# Patient Record
Sex: Female | Born: 1950 | Race: Black or African American | Hispanic: No | Smoking: Current every day smoker
Health system: Southern US, Community
[De-identification: ages and names within clinical notes are randomized; demographics above are authoritative.]

## PROBLEM LIST (undated history)

## (undated) DIAGNOSIS — E7800 Pure hypercholesterolemia, unspecified: Secondary | ICD-10-CM

## (undated) DIAGNOSIS — I10 Essential (primary) hypertension: Secondary | ICD-10-CM

## (undated) DIAGNOSIS — C3492 Malignant neoplasm of unspecified part of left bronchus or lung: Secondary | ICD-10-CM

## (undated) DIAGNOSIS — E1140 Type 2 diabetes mellitus with diabetic neuropathy, unspecified: Secondary | ICD-10-CM

## (undated) DIAGNOSIS — E119 Type 2 diabetes mellitus without complications: Secondary | ICD-10-CM

## (undated) DIAGNOSIS — IMO0001 Reserved for inherently not codable concepts without codable children: Secondary | ICD-10-CM

## (undated) DIAGNOSIS — Z794 Long term (current) use of insulin: Secondary | ICD-10-CM

## (undated) DIAGNOSIS — M1990 Unspecified osteoarthritis, unspecified site: Secondary | ICD-10-CM

## (undated) DIAGNOSIS — D759 Disease of blood and blood-forming organs, unspecified: Secondary | ICD-10-CM

## (undated) HISTORY — DX: Type 2 diabetes mellitus with diabetic neuropathy, unspecified: E11.40

## (undated) HISTORY — PX: ABDOMINAL HYSTERECTOMY: SHX81

## (undated) SURGERY — Surgical Case
Anesthesia: *Unknown

---

## 2007-07-25 ENCOUNTER — Ambulatory Visit: Payer: Self-pay | Admitting: Cardiovascular Disease

## 2007-09-26 ENCOUNTER — Ambulatory Visit: Payer: Self-pay | Admitting: Family Medicine

## 2007-09-27 ENCOUNTER — Ambulatory Visit: Payer: Self-pay | Admitting: Family Medicine

## 2007-10-06 ENCOUNTER — Ambulatory Visit: Payer: Self-pay | Admitting: Family Medicine

## 2010-01-12 ENCOUNTER — Ambulatory Visit: Payer: Self-pay | Admitting: Family Medicine

## 2011-09-21 ENCOUNTER — Ambulatory Visit: Payer: Self-pay | Admitting: Family Medicine

## 2011-10-28 ENCOUNTER — Emergency Department: Payer: Self-pay | Admitting: Internal Medicine

## 2012-08-21 ENCOUNTER — Ambulatory Visit: Payer: Self-pay | Admitting: Obstetrics & Gynecology

## 2012-08-21 LAB — PROTIME-INR: INR: 0.9

## 2012-08-21 LAB — BASIC METABOLIC PANEL
Anion Gap: 6 — ABNORMAL LOW (ref 7–16)
BUN: 7 mg/dL (ref 7–18)
Chloride: 110 mmol/L — ABNORMAL HIGH (ref 98–107)
Creatinine: 0.59 mg/dL — ABNORMAL LOW (ref 0.60–1.30)
EGFR (African American): >60
EGFR (Non-African Amer.): >60
Potassium: 3.5 mmol/L (ref 3.5–5.1)

## 2012-08-21 LAB — CBC
MCH: 28.5 pg (ref 26.0–34.0)
MCV: 87 fL (ref 80–100)
Platelet: 164 10*3/uL (ref 150–440)
RBC: 4.18 10*6/uL (ref 3.80–5.20)
RDW: 14.5 % (ref 11.5–14.5)
WBC: 5.4 10*3/uL (ref 3.6–11.0)

## 2012-08-21 LAB — APTT: Activated PTT: 32.9 secs (ref 23.6–35.9)

## 2012-08-31 ENCOUNTER — Ambulatory Visit: Payer: Self-pay | Admitting: Obstetrics & Gynecology

## 2012-09-04 LAB — PATHOLOGY REPORT

## 2012-09-24 DIAGNOSIS — E119 Type 2 diabetes mellitus without complications: Secondary | ICD-10-CM | POA: Insufficient documentation

## 2012-09-24 DIAGNOSIS — I10 Essential (primary) hypertension: Secondary | ICD-10-CM | POA: Insufficient documentation

## 2012-09-24 DIAGNOSIS — C549 Malignant neoplasm of corpus uteri, unspecified: Secondary | ICD-10-CM | POA: Insufficient documentation

## 2012-09-28 DIAGNOSIS — F329 Major depressive disorder, single episode, unspecified: Secondary | ICD-10-CM | POA: Insufficient documentation

## 2012-09-28 DIAGNOSIS — M159 Polyosteoarthritis, unspecified: Secondary | ICD-10-CM | POA: Insufficient documentation

## 2013-11-05 ENCOUNTER — Ambulatory Visit: Payer: Self-pay | Admitting: Internal Medicine

## 2013-12-01 ENCOUNTER — Ambulatory Visit: Payer: Self-pay | Admitting: Internal Medicine

## 2013-12-12 ENCOUNTER — Emergency Department: Payer: Self-pay | Admitting: Emergency Medicine

## 2014-02-15 ENCOUNTER — Emergency Department: Payer: Self-pay | Admitting: Emergency Medicine

## 2014-02-15 LAB — URINALYSIS, COMPLETE
Bilirubin,UR: NEGATIVE
Glucose,UR: >=500 mg/dL (ref 0–75)
Hyaline Cast: 1
NITRITE: NEGATIVE
PROTEIN: NEGATIVE
Ph: 5 (ref 4.5–8.0)
SPECIFIC GRAVITY: 1.028 (ref 1.003–1.030)
Squamous Epithelial: NONE SEEN

## 2014-05-05 DIAGNOSIS — N764 Abscess of vulva: Secondary | ICD-10-CM | POA: Insufficient documentation

## 2014-06-27 DIAGNOSIS — R42 Dizziness and giddiness: Secondary | ICD-10-CM | POA: Insufficient documentation

## 2014-09-11 ENCOUNTER — Ambulatory Visit: Payer: Self-pay | Admitting: Family Medicine

## 2015-02-21 ENCOUNTER — Emergency Department: Payer: Self-pay | Admitting: Emergency Medicine

## 2015-03-24 NOTE — Op Note (Signed)
PATIENT NAME:  Susan Willis, Susan Willis MR#:  [id number] DATE OF BIRTH:  03-17-1951  DATE OF PROCEDURE:  08/31/2012  PREOPERATIVE DIAGNOSES:   1. Postmenopausal bleeding.  2. Fibroid uterus.   POSTOPERATIVE DIAGNOSES:    1. Postmenopausal bleeding.  2. Fibroid uterus.   PROCEDURES:  1. Hysteroscopy.  2. Dilatation and curettage with resection of endometrium.   SURGEON: Glean Salen, M.D.   ANESTHESIA: General.   ESTIMATED BLOOD LOSS: Minimal.  COMPLICATIONS: None.   FINDINGS: The patient had an irregular shape lining of the uterus. The patient did not have a distinct intrauterine submucosal polyp or fibroid.   DISPOSITION: To the recovery room in stable condition.   TECHNIQUE: The patient is prepped and draped in the usual sterile fashion after adequate anesthesia is obtained in the dorsal lithotomy position. Bladder is drained with a Robinson catheter. Speculum is placed and the anterior lip of the cervix is grasped with a tenaculum. The cervix is dilated to a size 20 Pratt dilator. A 30 degree hysteroscope is inserted with saline distention of the intrauterine cavity. The above-mentioned findings are noted. No perforations or injuries are encountered. Discrepancy of saline fluid at the conclusion of the case was 400 mL.   Using the MyoSure resection device, sampling and resection of the lining particularly in the thickened areas that may be where an intramural fibroid was located is performed. Tissue is sent to pathology for further analysis. The patient has no bleeding after the procedure is performed. She goes to the recovery room in stable condition. All sponge, instrument, and needle counts are correct.   ____________________________ R. Barnett Applebaum, MD rph:ap D: 08/31/2012 14:25:03 ET T: 08/31/2012 15:37:23 ET JOB#: [id number]  cc: Glean Salen, MD, <Dictator> Gae Dry MD ELECTRONICALLY SIGNED 09/03/2012 5:09

## 2015-08-06 DIAGNOSIS — C3492 Malignant neoplasm of unspecified part of left bronchus or lung: Secondary | ICD-10-CM

## 2015-08-06 HISTORY — DX: Malignant neoplasm of unspecified part of left bronchus or lung: C34.92

## 2015-09-11 ENCOUNTER — Emergency Department
Admission: EM | Admit: 2015-09-11 | Discharge: 2015-09-11 | Disposition: A | Discharge status: Home / Self Care | Payer: PPO | Attending: Emergency Medicine | Admitting: Emergency Medicine

## 2015-09-11 ENCOUNTER — Encounter: Payer: Self-pay | Admitting: *Deleted

## 2015-09-11 ENCOUNTER — Emergency Department: Payer: PPO

## 2015-09-11 DIAGNOSIS — Z720 Tobacco use: Secondary | ICD-10-CM | POA: Diagnosis not present

## 2015-09-11 DIAGNOSIS — M25552 Pain in left hip: Secondary | ICD-10-CM | POA: Diagnosis present

## 2015-09-11 DIAGNOSIS — I10 Essential (primary) hypertension: Secondary | ICD-10-CM | POA: Diagnosis not present

## 2015-09-11 DIAGNOSIS — E119 Type 2 diabetes mellitus without complications: Secondary | ICD-10-CM | POA: Diagnosis not present

## 2015-09-11 HISTORY — DX: Pure hypercholesterolemia, unspecified: E78.00

## 2015-09-11 HISTORY — DX: Unspecified osteoarthritis, unspecified site: M19.90

## 2015-09-11 HISTORY — DX: Essential (primary) hypertension: I10

## 2015-09-11 HISTORY — DX: Type 2 diabetes mellitus without complications: E11.9

## 2015-09-11 MED ORDER — MELOXICAM 15 MG PO TABS: 15.0000 mg | ORAL_TABLET | Freq: Every day | ORAL | Status: DC

## 2015-09-11 MED ORDER — METHOCARBAMOL 500 MG PO TABS: 500.0000 mg | ORAL_TABLET | Freq: Four times a day (QID) | ORAL | Status: DC | PRN

## 2015-09-11 NOTE — ED Notes (Addendum)
Pt seen by PCP on Wednesday for same complaint.  Pain started a week ago. Hip pain goes to tail bone. Unable to sit or lay down for periods of time. PCP told her to come to ER. Did not take any pain medications this morning.

## 2015-09-11 NOTE — ED Notes (Signed)
Patient reports left hip pain x 1 week. Unknown injury, but states has been more active than usual. Saw PCP Wednesday, stated it was most likely arthritis, given vicodin for pain, but states it is still hurting and doctor told her to come to ED for further testing.

## 2015-09-11 NOTE — ED Provider Notes (Signed)
[facility] Emergency Department Provider Note  ____________________________________________  Time seen: Approximately 11:48 AM  I have reviewed the triage vital signs and the nursing notes.   HISTORY  Chief Complaint Hip Pain   HPI Susan Willis is a 64 y.o. female presents for evaluation of left hip pain times one week. Hip pain goes all the way down to her tailbone. PCP told her to come to the ER for evaluation no relief with Vicodin.  Past Medical History  Diagnosis Date  . Arthritis   . Diabetes mellitus without complication ([place])   . Hypertension   . High cholesterol     There are no active problems to display for this patient.   Past Surgical History  Procedure Laterality Date  . Abdominal hysterectomy    . Cesarean section      Current Outpatient Rx  Name  Route  Sig  Dispense  Refill  . meloxicam (MOBIC) 15 MG tablet   Oral   Take 1 tablet (15 mg total) by mouth daily.   30 tablet   2   . methocarbamol (ROBAXIN) 500 MG tablet   Oral   Take 1 tablet (500 mg total) by mouth every 6 (six) hours as needed for muscle spasms.   30 tablet   0     Allergies Review of patient's allergies indicates no known allergies.  No family history on file.  Social History Social History  Substance Use Topics  . Smoking status: Current Every Day Smoker  . Smokeless tobacco: None  . Alcohol Use: No    Review of Systems Constitutional: No fever/chills Eyes: No visual changes. ENT: No sore throat. Cardiovascular: Denies chest pain. Respiratory: Denies shortness of breath. Gastrointestinal: No abdominal pain.  No nausea, no vomiting.  No diarrhea.  No constipation. Genitourinary: Negative for dysuria. Musculoskeletal: Positive for left hip pain Skin: Negative for rash. Neurological: Negative for headaches, focal weakness or numbness.  10-point ROS otherwise negative.  ____________________________________________   PHYSICAL  EXAM:  VITAL SIGNS: ED Triage Vitals  Enc Vitals Group     BP 09/11/15 1140 154/67 mmHg     Pulse Rate 09/11/15 1140 96     Resp 09/11/15 1140 18     Temp 09/11/15 1140 98 F (36.7 C)     Temp Source 09/11/15 1140 Oral     SpO2 09/11/15 1140 98 %     Weight 09/11/15 1140 232 lb (105.235 kg)     Height 09/11/15 1140 '5\' 6"'$  (1.676 m)     Head Cir --      Peak Flow --      Pain Score 09/11/15 1138 6     Pain Loc --      Pain Edu? --      Excl. in [place]? --     Constitutional: Alert and oriented. Well appearing and in no acute distress. Eyes: Conjunctivae are normal. PERRL. EOMI. Head: Atraumatic. Nose: No congestion/rhinnorhea. Mouth/Throat: Mucous membranes are moist.  Oropharynx non-erythematous. Neck: No stridor.   Cardiovascular: Normal rate, regular rhythm. Grossly normal heart sounds.  Good peripheral circulation. Respiratory: Normal respiratory effort.  No retractions. Lungs CTAB. Gastrointestinal: Soft and nontender. No distention. No abdominal bruits. No CVA tenderness. Musculoskeletal: No lower extremity tenderness nor edema.  No joint effusions. Left hip with limited range of motion increased pain with ambulation. No lower extremity edema noted. Neurologic:  Normal speech and language. No gross focal neurologic deficits are appreciated. No gait instability. Skin:  Skin  is warm, dry and intact. No rash noted. Psychiatric: Mood and affect are normal. Speech and behavior are normal.  ____________________________________________   LABS (all labs ordered are listed, but only abnormal results are displayed)  Labs Reviewed - No data to display ____________________________________________  RADIOLOGY  Left hip x-ray was some mild DJD but no acute fracture dislocation noted. ____________________________________________   PROCEDURES  Procedure(s) performed: None  Critical Care performed: No  ____________________________________________   INITIAL IMPRESSION /  ASSESSMENT AND PLAN / ED COURSE  Pertinent labs & imaging results that were available during my care of the patient were reviewed by me and considered in my medical decision making (see chart for details).  Patient stop Motrin 800 and start meloxicam 15 mg daily Robaxin as needed to relax some of the posterior muscles and follow up with PCP. She voices no other emergency medical complaints at this time. ____________________________________________   FINAL CLINICAL IMPRESSION(S) / ED DIAGNOSES  Final diagnoses:  Hip pain, acute, left      Arlyss Repress, PA-C 09/11/15 1256  Delman Kitten, MD 09/11/15 [phone]

## 2015-09-11 NOTE — Discharge Instructions (Signed)

## 2015-11-05 ENCOUNTER — Encounter: Payer: Self-pay | Admitting: Emergency Medicine

## 2015-11-05 ENCOUNTER — Emergency Department: Payer: PPO

## 2015-11-05 ENCOUNTER — Emergency Department
Admission: EM | Admit: 2015-11-05 | Discharge: 2015-11-05 | Disposition: A | Discharge status: Home / Self Care | Payer: PPO | Attending: Emergency Medicine | Admitting: Emergency Medicine

## 2015-11-05 DIAGNOSIS — J189 Pneumonia, unspecified organism: Secondary | ICD-10-CM

## 2015-11-05 DIAGNOSIS — I10 Essential (primary) hypertension: Secondary | ICD-10-CM | POA: Diagnosis not present

## 2015-11-05 DIAGNOSIS — J159 Unspecified bacterial pneumonia: Secondary | ICD-10-CM | POA: Diagnosis not present

## 2015-11-05 DIAGNOSIS — Z791 Long term (current) use of non-steroidal anti-inflammatories (NSAID): Secondary | ICD-10-CM | POA: Insufficient documentation

## 2015-11-05 DIAGNOSIS — R0789 Other chest pain: Secondary | ICD-10-CM | POA: Diagnosis not present

## 2015-11-05 DIAGNOSIS — E119 Type 2 diabetes mellitus without complications: Secondary | ICD-10-CM | POA: Diagnosis not present

## 2015-11-05 DIAGNOSIS — R079 Chest pain, unspecified: Secondary | ICD-10-CM | POA: Diagnosis present

## 2015-11-05 DIAGNOSIS — F17200 Nicotine dependence, unspecified, uncomplicated: Secondary | ICD-10-CM | POA: Diagnosis not present

## 2015-11-05 LAB — BASIC METABOLIC PANEL
ANION GAP: 4 — AB (ref 5–15)
BUN: 14 mg/dL (ref 6–20)
CALCIUM: 9.8 mg/dL (ref 8.9–10.3)
CHLORIDE: 106 mmol/L (ref 101–111)
CO2: 30 mmol/L (ref 22–32)
Creatinine, Ser: 0.72 mg/dL (ref 0.44–1.00)
GFR calc non Af Amer: >60 mL/min (ref >60)
GLUCOSE: 223 mg/dL — AB (ref 65–99)
Potassium: 4.5 mmol/L (ref 3.5–5.1)
Sodium: 140 mmol/L (ref 135–145)

## 2015-11-05 LAB — CBC
HEMATOCRIT: 35.2 % (ref 35.0–47.0)
HEMOGLOBIN: 11.6 g/dL — AB (ref 12.0–16.0)
MCH: 28 pg (ref 26.0–34.0)
MCHC: 32.9 g/dL (ref 32.0–36.0)
MCV: 85 fL (ref 80.0–100.0)
Platelets: 177 10*3/uL (ref 150–440)
RBC: 4.14 MIL/uL (ref 3.80–5.20)
RDW: 14.3 % (ref 11.5–14.5)
WBC: 6.1 10*3/uL (ref 3.6–11.0)

## 2015-11-05 LAB — TROPONIN I: Troponin I: <0.03 ng/mL (ref <0.031)

## 2015-11-05 MED ORDER — DOXYCYCLINE HYCLATE 100 MG PO CAPS: ORAL_CAPSULE | ORAL | Status: DC

## 2015-11-05 NOTE — Discharge Instructions (Signed)
We believe that your symptoms are caused today by pneumonia, an infection in your lung(s).  Fortunately you should start to improve quickly after taking your antibiotics.  Please take the full course of antibiotics as prescribed and drink plenty of fluids.    Follow up with your doctor as scheduled in about 2 weeks.  That will give you the opportunity to finish your full course of treatment of antibiotics (a 10-day course), and your primary care doctor will likely want to repeat a 2-view chest x-ray to make sure the infection has resolved.  Please let her know that the radiologist recommended that you get this repeat outpatient xray after treatment.  If you develop any new or worsening symptoms, including but not limited to fever in spite of taking over-the-counter ibuprofen and/or Tylenol, persistent vomiting, worsening shortness of breath, or other symptoms that concern you, please return to the Emergency Department immediately.    Pneumonia Pneumonia is an infection of the lungs.  CAUSES Pneumonia may be caused by bacteria or a virus. Usually, these infections are caused by breathing infectious particles into the lungs (respiratory tract). SIGNS AND SYMPTOMS   Cough.  Fever.  Chest pain.  Increased rate of breathing.  Wheezing.  Mucus production. DIAGNOSIS  If you have the common symptoms of pneumonia, your health care provider will typically confirm the diagnosis with a chest X-ray. The X-ray will show an abnormality in the lung (pulmonary infiltrate) if you have pneumonia. Other tests of your blood, urine, or sputum may be done to find the specific cause of your pneumonia. Your health care provider may also do tests (blood gases or pulse oximetry) to see how well your lungs are working. TREATMENT  Some forms of pneumonia may be spread to other people when you cough or sneeze. You may be asked to wear a mask before and during your exam. Pneumonia that is caused by bacteria is treated  with antibiotic medicine. Pneumonia that is caused by the influenza virus may be treated with an antiviral medicine. Most other viral infections must run their course. These infections will not respond to antibiotics.  HOME CARE INSTRUCTIONS   Cough suppressants may be used if you are losing too much rest. However, coughing protects you by clearing your lungs. You should avoid using cough suppressants if you can.  Your health care provider may have prescribed medicine if he or she thinks your pneumonia is caused by bacteria or influenza. Finish your medicine even if you start to feel better.  Your health care provider may also prescribe an expectorant. This loosens the mucus to be coughed up.  Take medicines only as directed by your health care provider.  Do not smoke. Smoking is a common cause of bronchitis and can contribute to pneumonia. If you are a smoker and continue to smoke, your cough may last several weeks after your pneumonia has cleared.  A cold steam vaporizer or humidifier in your room or home may help loosen mucus.  Coughing is often worse at night. Sleeping in a semi-upright position in a recliner or using a couple pillows under your head will help with this.  Get rest as you feel it is needed. Your body will usually let you know when you need to rest. PREVENTION A pneumococcal shot (vaccine) is available to prevent a common bacterial cause of pneumonia. This is usually suggested for:  People over 22 years old.  Patients on chemotherapy.  People with chronic lung problems, such as bronchitis or emphysema.  People with immune system problems. If you are over 65 or have a high risk condition, you may receive the pneumococcal vaccine if you have not received it before. In some countries, a routine influenza vaccine is also recommended. This vaccine can help prevent some cases of pneumonia.You may be offered the influenza vaccine as part of your care. If you smoke, it is  time to quit. You may receive instructions on how to stop smoking. Your health care provider can provide medicines and counseling to help you quit. SEEK MEDICAL CARE IF: You have a fever. SEEK IMMEDIATE MEDICAL CARE IF:   Your illness becomes worse. This is especially true if you are elderly or weakened from any other disease.  You cannot control your cough with suppressants and are losing sleep.  You begin coughing up blood.  You develop pain which is getting worse or is uncontrolled with medicines.  Any of the symptoms which initially brought you in for treatment are getting worse rather than better.  You develop shortness of breath or chest pain. MAKE SURE YOU:   Understand these instructions.  Will watch your condition.  Will get help right away if you are not doing well or get worse. Document Released: 11/21/2005 Document Revised: 04/07/2014 Document Reviewed: 02/10/2011 [facility] Patient Information 2015 [place], [place]. This information is not intended to replace advice given to you by your health care provider. Make sure you discuss any questions you have with your health care provider.

## 2015-11-05 NOTE — ED Notes (Signed)
Ambulatory to triage , no distress noted , complaining of chest pain and shortness of breath x1 week, denies cough, skin warm and dry

## 2015-11-05 NOTE — ED Notes (Signed)
Patient presents to the ED with sharp stabbing constant left sided chest pain x 2 weeks.  Patient states pain is only relieved with sleep.  Patient states she was taking meloxicam for hip pain but stopped taking it when she started having chest pain and read that meloxicam occasionally causes chest pain.  Patient states chest pain improved slightly when she stopped taking the meloxicam but has not subsided completely.

## 2015-11-05 NOTE — ED Provider Notes (Signed)
[facility] Emergency Department Provider Note  ____________________________________________  Time seen: Approximately 1:40 PM  I have reviewed the triage vital signs and the nursing notes.   HISTORY  Chief Complaint Chest Pain    HPI DARSHA ZUMSTEIN is a 64 y.o. female with hypertension and uncomplicated diabetes who presents with sharp stabbing constant left upper chest pain for about 2 weeks.  She states that nothing makes it better and nothing makes it worse.  She has had minimal shortness of breath recently that may or may not be associated with the chest pain.  She has had no nausea or vomiting.  She has had some subjective fevers but not consistently.  She is just frustrated that it keeps going on.  She describes the pain as moderate in severity.  She has had a little bit of nonproductive cough and had some general "cold-like symptoms" when the symptoms began gradually.   Past Medical History  Diagnosis Date  . Arthritis   . Diabetes mellitus without complication ([place])   . Hypertension   . High cholesterol     There are no active problems to display for this patient.   Past Surgical History  Procedure Laterality Date  . Abdominal hysterectomy    . Cesarean section      Current Outpatient Rx  Name  Route  Sig  Dispense  Refill  .           . meloxicam (MOBIC) 15 MG tablet   Oral   Take 1 tablet (15 mg total) by mouth daily.   30 tablet   2   . methocarbamol (ROBAXIN) 500 MG tablet   Oral   Take 1 tablet (500 mg total) by mouth every 6 (six) hours as needed for muscle spasms.   30 tablet   0     Allergies Review of patient's allergies indicates no known allergies.  No family history on file.  Social History Social History  Substance Use Topics  . Smoking status: Current Every Day Smoker  . Smokeless tobacco: None  . Alcohol Use: No    Review of Systems Constitutional: Some suggestive fevers or "hot flashes" Eyes: No visual  changes. ENT: No sore throat. Cardiovascular: Upper left sharp stabbing chest pain for 2 weeks Respiratory: Minimal shortness of breath at times Gastrointestinal: No abdominal pain.  No nausea, no vomiting.  No diarrhea.  No constipation. Genitourinary: Negative for dysuria. Musculoskeletal: Negative for back pain. Skin: Negative for rash. Neurological: Negative for headaches, focal weakness or numbness.  10-point ROS otherwise negative.  ____________________________________________   PHYSICAL EXAM:  VITAL SIGNS: ED Triage Vitals  Enc Vitals Group     BP 11/05/15 0932 148/65 mmHg     Pulse Rate 11/05/15 0932 78     Resp 11/05/15 0932 16     Temp 11/05/15 0932 98.4 F (36.9 C)     Temp Source 11/05/15 0932 Oral     SpO2 11/05/15 0932 96 %     Weight 11/05/15 0932 242 lb (109.77 kg)     Height 11/05/15 0932 '5\' 5"'$  (1.651 m)     Head Cir --      Peak Flow --      Pain Score 11/05/15 0933 5     Pain Loc --      Pain Edu? --      Excl. in [place]? --     Constitutional: Alert and oriented. Well appearing and in no acute distress. Eyes: Conjunctivae are normal.  PERRL. EOMI. Head: Atraumatic. Nose: No congestion/rhinnorhea. Mouth/Throat: Mucous membranes are moist.  Oropharynx non-erythematous. Neck: No stridor.   Cardiovascular: Normal rate, regular rhythm. Grossly normal heart sounds.  Good peripheral circulation.  No chest wall tenderness to palpation. Respiratory: Normal respiratory effort.  No retractions. Lungs CTAB. Gastrointestinal: Soft and nontender. No distention. No abdominal bruits. No CVA tenderness. Musculoskeletal: No lower extremity tenderness nor edema.  No joint effusions. Neurologic:  Normal speech and language. No gross focal neurologic deficits are appreciated.  Skin:  Skin is warm, dry and intact. No rash noted. Psychiatric: Mood and affect are normal. Speech and behavior are normal.  ____________________________________________   LABS (all labs ordered  are listed, but only abnormal results are displayed)  Labs Reviewed  BASIC METABOLIC PANEL - Abnormal; Notable for the following:    Glucose, Bld 223 (*)    Anion gap 4 (*)    All other components within normal limits  CBC - Abnormal; Notable for the following:    Hemoglobin 11.6 (*)    All other components within normal limits  TROPONIN I   ____________________________________________  EKG  ED ECG REPORT I, Kermitt Harjo, the attending physician, personally viewed and interpreted this ECG.  Date: 11/05/2015 EKG Time: 9:19 Rate: 80 Rhythm: normal sinus rhythm QRS Axis: normal Intervals: normal ST/T Wave abnormalities: normal Conduction Disutrbances: none Narrative Interpretation: unremarkable  ____________________________________________  RADIOLOGY  I, Zacchaeus Halm, personally viewed and evaluated these images (plain radiographs) as part of my medical decision making, as well as taking into consideration the written report by the radiologist.   Dg Chest 2 View  11/05/2015  CLINICAL DATA:  Chest pain for 2 weeks EXAM: CHEST  2 VIEW COMPARISON:  Chest radiograph February 21, 2015 and chest CT February 21, 2015 FINDINGS: There is airspace consolidation in the anterior segment of the left upper lobe. There is underlying interstitial prominence consistent with underlying fibrotic type change. Heart size and pulmonary vascular normal. No adenopathy. There is degenerative change in the thoracic spine. IMPRESSION: Infiltrate anterior segment left upper lobe. It should be noted that underlying mass in this area cannot be excluded radiographically. Followup PA and lateral chest radiographs recommended in 3-4 weeks following trial of antibiotic therapy to ensure resolution and exclude underlying malignancy. Underlying fibrotic type change. Stable cardiac silhouette. Electronically Signed   By: Lowella Grip III M.D.   On: 11/05/2015 10:38     ____________________________________________   PROCEDURES  Procedure(s) performed: None  Critical Care performed: No ____________________________________________   INITIAL IMPRESSION / ASSESSMENT AND PLAN / ED COURSE  Pertinent labs & imaging results that were available during my care of the patient were reviewed by me and considered in my medical decision making (see chart for details).  The patient is well-appearing and comfortable and in no acute distress with stable vital signs and is afebrile.  It sounds as if she may have had a mild viral infection that has developed into a community-acquired pneumonia.  The location of her chest pain is consistent with the location of the infiltrate the radiologist describes on the chest x-ray.  I discussed the plan with the patient which is a full course of doxycycline and an outpatient follow-up for repeat imaging as recommended by the radiologist, and she understands and agrees with this plan.  I gave my usual and customary return precautions.      ____________________________________________  FINAL CLINICAL IMPRESSION(S) / ED DIAGNOSES  Final diagnoses:  Community acquired pneumonia  Atypical chest pain  NEW MEDICATIONS STARTED DURING THIS VISIT:  New Prescriptions   DOXYCYCLINE (VIBRAMYCIN) 100 MG CAPSULE    Take 1 capsule (100 mg) by mouth twice daily for 10 days.     Hinda Kehr, MD 11/05/15 1345

## 2015-11-16 ENCOUNTER — Emergency Department: Payer: PPO

## 2015-11-16 ENCOUNTER — Other Ambulatory Visit: Payer: Self-pay

## 2015-11-16 ENCOUNTER — Emergency Department
Admission: EM | Admit: 2015-11-16 | Discharge: 2015-11-16 | Disposition: A | Discharge status: Home / Self Care | Payer: PPO | Attending: Emergency Medicine | Admitting: Emergency Medicine

## 2015-11-16 DIAGNOSIS — R918 Other nonspecific abnormal finding of lung field: Secondary | ICD-10-CM

## 2015-11-16 DIAGNOSIS — Z794 Long term (current) use of insulin: Secondary | ICD-10-CM | POA: Diagnosis not present

## 2015-11-16 DIAGNOSIS — Z792 Long term (current) use of antibiotics: Secondary | ICD-10-CM | POA: Diagnosis not present

## 2015-11-16 DIAGNOSIS — R0602 Shortness of breath: Secondary | ICD-10-CM | POA: Diagnosis present

## 2015-11-16 DIAGNOSIS — F17210 Nicotine dependence, cigarettes, uncomplicated: Secondary | ICD-10-CM | POA: Diagnosis not present

## 2015-11-16 DIAGNOSIS — Z7984 Long term (current) use of oral hypoglycemic drugs: Secondary | ICD-10-CM | POA: Diagnosis not present

## 2015-11-16 DIAGNOSIS — E119 Type 2 diabetes mellitus without complications: Secondary | ICD-10-CM | POA: Diagnosis not present

## 2015-11-16 DIAGNOSIS — I10 Essential (primary) hypertension: Secondary | ICD-10-CM | POA: Insufficient documentation

## 2015-11-16 DIAGNOSIS — R079 Chest pain, unspecified: Secondary | ICD-10-CM | POA: Insufficient documentation

## 2015-11-16 DIAGNOSIS — Z79899 Other long term (current) drug therapy: Secondary | ICD-10-CM | POA: Insufficient documentation

## 2015-11-16 LAB — URINALYSIS COMPLETE WITH MICROSCOPIC (ARMC ONLY)
BILIRUBIN URINE: NEGATIVE
Bacteria, UA: NONE SEEN
Glucose, UA: 50 mg/dL — AB
HGB URINE DIPSTICK: NEGATIVE
Ketones, ur: NEGATIVE mg/dL
LEUKOCYTES UA: NEGATIVE
Nitrite: NEGATIVE
PH: 5 (ref 5.0–8.0)
PROTEIN: NEGATIVE mg/dL
Specific Gravity, Urine: 1.024 (ref 1.005–1.030)

## 2015-11-16 LAB — CBC
HEMATOCRIT: 34.5 % — AB (ref 35.0–47.0)
HEMOGLOBIN: 11.3 g/dL — AB (ref 12.0–16.0)
MCH: 27.9 pg (ref 26.0–34.0)
MCHC: 32.9 g/dL (ref 32.0–36.0)
MCV: 84.7 fL (ref 80.0–100.0)
Platelets: 163 10*3/uL (ref 150–440)
RBC: 4.07 MIL/uL (ref 3.80–5.20)
RDW: 14 % (ref 11.5–14.5)
WBC: 5.5 10*3/uL (ref 3.6–11.0)

## 2015-11-16 LAB — BASIC METABOLIC PANEL
ANION GAP: 4 — AB (ref 5–15)
BUN: 8 mg/dL (ref 6–20)
CALCIUM: 9 mg/dL (ref 8.9–10.3)
CHLORIDE: 107 mmol/L (ref 101–111)
CO2: 28 mmol/L (ref 22–32)
Creatinine, Ser: 0.62 mg/dL (ref 0.44–1.00)
GFR calc Af Amer: >60 mL/min (ref >60)
GFR calc non Af Amer: >60 mL/min (ref >60)
GLUCOSE: 270 mg/dL — AB (ref 65–99)
POTASSIUM: 3.4 mmol/L — AB (ref 3.5–5.1)
Sodium: 139 mmol/L (ref 135–145)

## 2015-11-16 LAB — TROPONIN I

## 2015-11-16 MED ORDER — OXYCODONE-ACETAMINOPHEN 5-325 MG PO TABS: 1.0000 | ORAL_TABLET | ORAL | Status: DC | PRN

## 2015-11-16 MED ORDER — SODIUM CHLORIDE 0.9 % IV SOLN
Freq: Once | INTRAVENOUS | Status: AC
  Administered 2015-11-16: 12:00:00 via INTRAVENOUS

## 2015-11-16 MED ORDER — OXYCODONE-ACETAMINOPHEN 5-325 MG PO TABS
1.0000 | ORAL_TABLET | Freq: Once | ORAL | Status: AC
  Administered 2015-11-16: 1 via ORAL

## 2015-11-16 MED ORDER — OXYCODONE-ACETAMINOPHEN 5-325 MG PO TABS
ORAL_TABLET | ORAL | Status: AC
  Administered 2015-11-16: 1 via ORAL
  Filled 2015-11-16: qty 1

## 2015-11-16 MED ORDER — IOHEXOL 350 MG/ML SOLN
100.0000 mL | Freq: Once | INTRAVENOUS | Status: AC | PRN
  Administered 2015-11-16: 100 mL via INTRAVENOUS

## 2015-11-16 NOTE — ED Notes (Signed)
Patient is resting comfortably. 

## 2015-11-16 NOTE — ED Notes (Signed)
Susan Willis at bedside.

## 2015-11-16 NOTE — Discharge Instructions (Signed)
Pulmonary Nodule A pulmonary nodule is a small, round growth of tissue in the lung. Pulmonary nodules can range in size from less than 1/5 inch (4 mm) to a little bigger than an inch (25 mm). Most pulmonary nodules are detected when imaging tests of the lung are being performed for a different problem. Pulmonary nodules are usually not cancerous (benign). However, some pulmonary nodules are cancerous (malignant). Follow-up treatment or testing is based on the size of the pulmonary nodule and your risk of getting lung cancer.  CAUSES Benign pulmonary nodules can be caused by various things. Some of the causes include:   Bacterial, fungal, or viral infections. This is usually an old infection that is no longer active, but it can sometimes be a current, active infection.  A benign mass of tissue.  Inflammation from conditions such as rheumatoid arthritis.   Abnormal blood vessels in the lungs. Malignant pulmonary nodules can result from lung cancer or from cancers that spread to the lung from other places in the body. SIGNS AND SYMPTOMS Pulmonary nodules usually do not cause symptoms. DIAGNOSIS Most often, pulmonary nodules are found incidentally when an X-ray or CT scan is performed to look for some other problem in the lung area. To help determine whether a pulmonary nodule is benign or malignant, your health care provider will take a medical history and order a variety of tests. Tests done may include:   Blood tests.  A skin test called a tuberculin test. This test is used to determine if you have been exposed to the germ that causes tuberculosis.   Chest X-rays. If possible, a new X-ray may be compared with X-rays you have had in the past.   CT scan. This test shows smaller pulmonary nodules more clearly than an X-ray.   Positron emission tomography (PET) scan. In this test, a safe amount of a radioactive substance is injected into the bloodstream. Then, the scan takes a picture of  the pulmonary nodule. The radioactive substance is eliminated from your body in your urine.   Biopsy. A tiny piece of the pulmonary nodule is removed so it can be checked under a microscope. TREATMENT  Pulmonary nodules that are benign normally do not require any treatment because they usually do not cause symptoms or breathing problems. Your health care provider may want to monitor the pulmonary nodule through follow-up CT scans. The frequency of these CT scans will vary based on the size of the nodule and the risk factors for lung cancer. For example, CT scans will need to be done more frequently if the pulmonary nodule is larger and if you have a history of smoking and a family history of cancer. Further testing or biopsies may be done if any follow-up CT scan shows that the size of the pulmonary nodule has increased. HOME CARE INSTRUCTIONS  Only take over-the-counter or prescription medicines as directed by your health care provider.  Keep all follow-up appointments with your health care provider. SEEK MEDICAL CARE IF:  You have trouble breathing when you are active.   You feel sick or unusually tired.   You do not feel like eating.   You lose weight without trying to.   You develop chills or night sweats.  SEEK IMMEDIATE MEDICAL CARE IF:  You cannot catch your breath, or you begin wheezing.   You cannot stop coughing.   You cough up blood.   You become dizzy or feel like you are going to pass out.   You  have sudden chest pain.   You have a fever or persistent symptoms for more than 2-3 days.   You have a fever and your symptoms suddenly get worse. MAKE SURE YOU:  Understand these instructions.  Will watch your condition.  Will get help right away if you are not doing well or get worse.   This information is not intended to replace advice given to you by your health care provider. Make sure you discuss any questions you have with your health care  provider.   Document Released: 09/18/2009 Document Revised: 07/24/2013 Document Reviewed: 05/13/2013 Elsevier Interactive Patient Education [employer].  I spoke with Dr. Grayland Ormond at the cancer center. He said he would get you an appointment this P seen for further evaluation of the lung mass sometime next few days. If he does not call you some time before you get home. Please call his office to confirm that time and date of the appointment. Please return here for any further problems. In the meantime for the pain and I will give you Percocet one pill 4 times a day. Be careful like can make you woozy. You can also take Motrin 600 mg 3 times a day.  The same is taking 3 over-the-counter Motrin pills 3 times a day take them with food. Do not take the Motrin for more than 45 days as they can damage her kidneys especially since her older and have other medical problems.

## 2015-11-16 NOTE — ED Notes (Signed)
Pt c/o epigastric to lower chest pain with SOB for the past month.. Pt was seen with same sx 12/1 and Dx with pneumonia.. Pt is in NAD on arrival.. Respirations WNL.Marland Kitchen

## 2015-11-16 NOTE — ED Provider Notes (Signed)
[facility] Emergency Department Provider Note  ____________________________________________  Time seen: Approximately 2:12 PM  I have reviewed the triage vital signs and the nursing notes.   HISTORY  Chief Complaint Shortness of Breath    HPI Susan Willis is a 64 y.o. female patient reports approximately one month of pain in the left side of the chest worse with laying down somewhat better if she sits up although it's not really changing much anymore for the last couple weeks it had been better when she sat up initially. She does not have a fever she really is not coughing she has become somewhat short of breath in the last 3 or 4 days or so she is not running a fever. Patient has never had anything like this before   Past Medical History  Diagnosis Date  . Arthritis   . Diabetes mellitus without complication ([place])   . Hypertension   . High cholesterol     There are no active problems to display for this patient.   Past Surgical History  Procedure Laterality Date  . Abdominal hysterectomy    . Cesarean section      Current Outpatient Rx  Name  Route  Sig  Dispense  Refill  . atorvastatin (LIPITOR) 20 MG tablet   Oral   Take 20 mg by mouth daily at 6 PM.         . insulin glargine (LANTUS) 100 UNIT/ML injection   Subcutaneous   Inject 60 Units into the skin at bedtime.         Marland Kitchen linagliptin (TRADJENTA) 5 MG TABS tablet   Oral   Take 5 mg by mouth daily.         Marland Kitchen lisinopril (PRINIVIL,ZESTRIL) 10 MG tablet   Oral   Take 10 mg by mouth daily.         . metFORMIN (GLUMETZA) 500 MG (MOD) 24 hr tablet   Oral   Take 1,000 mg by mouth 2 (two) times daily with a meal.         . doxycycline (VIBRAMYCIN) 100 MG capsule      Take 1 capsule (100 mg) by mouth twice daily for 10 days.   20 capsule   0   . oxyCODONE-acetaminophen (ROXICET) 5-325 MG tablet   Oral   Take 1 tablet by mouth every 4 (four) hours as needed for severe  pain.   6 tablet   0     Allergies Review of patient's allergies indicates no known allergies.  No family history on file.  Social History Social History  Substance Use Topics  . Smoking status: Current Every Day Smoker    Types: Cigarettes  . Smokeless tobacco: None  . Alcohol Use: No    Review of Systems Constitutional: No fever/chills Eyes: No visual changes. ENT: No sore throat. Cardiovascular: See history of present illness Respiratory: See history of present illness Gastrointestinal: No abdominal pain.  No nausea, no vomiting.  No diarrhea.  No constipation. Genitourinary: Negative for dysuria. Musculoskeletal: Negative for back pain. Skin: Negative for rash. Neurological: Negative for headaches, focal weakness or numbness.  10-point ROS otherwise negative.  ____________________________________________   PHYSICAL EXAM:  VITAL SIGNS: ED Triage Vitals  Enc Vitals Group     BP 11/16/15 0729 155/66 mmHg     Pulse Rate 11/16/15 0729 85     Resp 11/16/15 0729 18     Temp 11/16/15 0729 98.1 F (36.7 C)     Temp  Source 11/16/15 0729 Oral     SpO2 11/16/15 0729 98 %     Weight 11/16/15 0719 250 lb (113.399 kg)     Height 11/16/15 0719 '5\' 6"'$  (1.676 m)     Head Cir --      Peak Flow --      Pain Score 11/16/15 0719 5     Pain Loc --      Pain Edu? --      Excl. in [place]? --     Constitutional: Alert and oriented. Well appearing and in no acute distress. Eyes: Conjunctivae are normal. PERRL. EOMI. Head: Atraumatic. Nose: No congestion/rhinnorhea. Mouth/Throat: Mucous membranes are moist.  Oropharynx non-erythematous. Neck: No stridor. Cardiovascular: Normal rate, regular rhythm. Grossly normal heart sounds.  Good peripheral circulation. Respiratory: Normal respiratory effort.  No retractions. Lungs CTAB. Gastrointestinal: Soft and nontender. No distention. No abdominal bruits. No CVA tenderness. Musculoskeletal: No lower extremity tenderness nor edema.  No  joint effusions. Neurologic:  Normal speech and language. No gross focal neurologic deficits are appreciated. No gait instability. Skin:  Skin is warm, dry and intact. No rash noted. Psychiatric: Mood and affect are normal. Speech and behavior are normal.  ____________________________________________   LABS (all labs ordered are listed, but only abnormal results are displayed)  Labs Reviewed  BASIC METABOLIC PANEL - Abnormal; Notable for the following:    Potassium 3.4 (*)    Glucose, Bld 270 (*)    Anion gap 4 (*)    All other components within normal limits  CBC - Abnormal; Notable for the following:    Hemoglobin 11.3 (*)    HCT 34.5 (*)    All other components within normal limits  TROPONIN I  URINALYSIS COMPLETEWITH MICROSCOPIC ([facility] ONLY)   ____________________________________________  EKG  EKG read and interpreted by me shows normal sinus rhythm at a rate of 86 left axis no acute ST-T wave changes ____________________________________________  RADIOLOGY  Chest CT shows what appears to be a lung cancer ____________________________________________   PROCEDURES  I got Dr. Grayland Ormond on the phone from the cancer center he will set up an appointment for her to have follow-up sometime in the next couple days. Patient does not meet admission criteria. O2 sats are normal blood pressures normal labs are essentially normal CT does not show any evidence ofPE  ____________________________________________   INITIAL IMPRESSION / ASSESSMENT AND PLAN / ED COURSE  Pertinent labs & imaging results that were available during my care of the patient were reviewed by me and considered in my medical decision making (see chart for details).  Critical care time 10 minutes discussing patient's problem with the patient her family and Dr. Grayland Ormond ____________________________________________   FINAL CLINICAL IMPRESSION(S) / ED DIAGNOSES  Final diagnoses:  Lung mass      Nena Polio, MD 11/16/15 1539

## 2015-11-16 NOTE — ED Notes (Signed)
MD at bedside. 

## 2015-11-19 ENCOUNTER — Inpatient Hospital Stay: Payer: PPO | Attending: Oncology | Admitting: Oncology

## 2015-11-19 ENCOUNTER — Encounter: Payer: Self-pay | Admitting: Oncology

## 2015-11-19 VITALS — BP 178/94 | HR 80 | Temp 97.7°F | Resp 20 | Ht 65.35 in | Wt 246.5 lb

## 2015-11-19 DIAGNOSIS — Z79899 Other long term (current) drug therapy: Secondary | ICD-10-CM | POA: Diagnosis not present

## 2015-11-19 DIAGNOSIS — Z794 Long term (current) use of insulin: Secondary | ICD-10-CM | POA: Diagnosis not present

## 2015-11-19 DIAGNOSIS — C3412 Malignant neoplasm of upper lobe, left bronchus or lung: Secondary | ICD-10-CM | POA: Diagnosis present

## 2015-11-19 DIAGNOSIS — F17210 Nicotine dependence, cigarettes, uncomplicated: Secondary | ICD-10-CM | POA: Insufficient documentation

## 2015-11-19 DIAGNOSIS — E7800 Pure hypercholesterolemia, unspecified: Secondary | ICD-10-CM | POA: Insufficient documentation

## 2015-11-19 DIAGNOSIS — R918 Other nonspecific abnormal finding of lung field: Secondary | ICD-10-CM | POA: Diagnosis not present

## 2015-11-19 DIAGNOSIS — E119 Type 2 diabetes mellitus without complications: Secondary | ICD-10-CM | POA: Diagnosis not present

## 2015-11-19 DIAGNOSIS — R590 Localized enlarged lymph nodes: Secondary | ICD-10-CM | POA: Diagnosis not present

## 2015-11-19 DIAGNOSIS — I10 Essential (primary) hypertension: Secondary | ICD-10-CM | POA: Insufficient documentation

## 2015-11-19 DIAGNOSIS — C3402 Malignant neoplasm of left main bronchus: Secondary | ICD-10-CM

## 2015-11-19 MED ORDER — OXYCODONE HCL 10 MG PO TABS: 10.0000 mg | ORAL_TABLET | Freq: Four times a day (QID) | ORAL | Status: DC | PRN

## 2015-11-19 NOTE — Progress Notes (Signed)
Patient went to ER with left side chest pain and CT showed lung mass.

## 2015-11-20 ENCOUNTER — Telehealth: Payer: Self-pay | Admitting: *Deleted

## 2015-11-20 NOTE — Telephone Encounter (Signed)
Notified patient of pre-op appointment scheduled for 11/23/15 1pm in [place], 2nd floor, to bring medications. Also notified of procedure scheduled for 11/25/15 [facility] arrival. Reminded to be NPO after midnight, continue to avoid anticoagulant medications, and have a driver and support person to be with patient at home after the procedure. Patient verbalizes understanding and reads back appointments.

## 2015-11-20 NOTE — Telephone Encounter (Signed)
Thank you.  Her note is completed.

## 2015-11-20 NOTE — Progress Notes (Signed)
Susan Willis  [phone] [phone]) [phone]  ID: Susan Willis OB: [birth year]  MR#: [id number]  QAS#:[id number]  Patient Care Team: Marguerita Merles, MD as PCP - General (Family Medicine)  CHIEF COMPLAINT: Lung mass.  INTERVAL HISTORY: Patient is a 64 year old female who recently presented to the emergency room with left-sided chest pain. Workup included a CT scan which showed a large left sided lung mass with lymphadenopathy. She otherwise feels well. She has no neurologic complaints. She denies any recent fevers or illnesses. She has a fair appetite and denies weight loss. She denies any shortness of breath, cough, or hemoptysis. She has no nausea, vomiting, constipation, or diarrhea. She has no urinary complaints. Patient otherwise feels well and offers no further specific complaints.   REVIEW OF SYSTEMS:   Review of Systems  Constitutional: Negative for fever, weight loss and malaise/fatigue.  Respiratory: Negative for cough, hemoptysis and shortness of breath.   Cardiovascular: Positive for chest pain. Negative for leg swelling.  Gastrointestinal: Negative.   Musculoskeletal: Negative.   Neurological: Negative.  Negative for weakness.    As per HPI. Otherwise, a complete review of systems is negatve.  PAST MEDICAL HISTORY: Past Medical History  Diagnosis Date  . Arthritis   . Diabetes mellitus without complication ([place])   . Hypertension   . High cholesterol     PAST SURGICAL HISTORY: Past Surgical History  Procedure Laterality Date  . Abdominal hysterectomy    . Cesarean section      FAMILY HISTORY: Reviewed and unchanged. No reported history of malignancy or chronic disease.     ADVANCED DIRECTIVES:    HEALTH MAINTENANCE: Social History  Substance Use Topics  . Smoking status: Current Every Day Smoker -- 2.00 packs/day for 35 years    Types: Cigarettes  . Smokeless tobacco: Never Used  . Alcohol Use: No      Colonoscopy:  PAP:  Bone density:  Lipid panel:  No Known Allergies  Current Outpatient Prescriptions  Medication Sig Dispense Refill  . atorvastatin (LIPITOR) 20 MG tablet Take 20 mg by mouth daily at 6 PM.    . insulin glargine (LANTUS) 100 UNIT/ML injection Inject 60 Units into the skin at bedtime.    Marland Kitchen linagliptin (TRADJENTA) 5 MG TABS tablet Take 5 mg by mouth daily.    Marland Kitchen lisinopril (PRINIVIL,ZESTRIL) 10 MG tablet Take 10 mg by mouth daily.    . metFORMIN (GLUMETZA) 500 MG (MOD) 24 hr tablet Take 1,000 mg by mouth 2 (two) times daily with a meal.    . doxycycline (VIBRAMYCIN) 100 MG capsule Take 1 capsule (100 mg) by mouth twice daily for 10 days. (Patient not taking: Reported on 11/19/2015) 20 capsule 0  . Oxycodone HCl 10 MG TABS Take 1 tablet (10 mg total) by mouth every 6 (six) hours as needed. 60 tablet 0  . oxyCODONE-acetaminophen (ROXICET) 5-325 MG tablet Take 1 tablet by mouth every 4 (four) hours as needed for severe pain. (Patient not taking: Reported on 11/19/2015) 6 tablet 0   No current facility-administered medications for this visit.    OBJECTIVE: Filed Vitals:   11/19/15 1346  BP: 178/94  Pulse: 80  Temp: 97.7 F (36.5 C)  Resp: 20     Body mass index is 40.57 kg/(m^2).    ECOG FS:1 - Symptomatic but completely ambulatory  General: Well-developed, well-nourished, no acute distress. Eyes: Pink conjunctiva, anicteric sclera. HEENT: Normocephalic, moist mucous membranes, clear oropharnyx. Lungs: Clear to auscultation bilaterally. Heart: Regular rate  and rhythm. No rubs, murmurs, or gallops. Abdomen: Soft, nontender, nondistended. No organomegaly noted, normoactive bowel sounds. Musculoskeletal: No edema, cyanosis, or clubbing. Neuro: Alert, answering all questions appropriately. Cranial nerves grossly intact. Skin: No rashes or petechiae noted. Psych: Normal affect. Lymphatics: No cervical, calvicular, axillary or inguinal LAD.   LAB  RESULTS:  Lab Results  Component Value Date   NA 139 11/16/2015   K 3.4* 11/16/2015   CL 107 11/16/2015   CO2 28 11/16/2015   GLUCOSE 270* 11/16/2015   BUN 8 11/16/2015   CREATININE 0.62 11/16/2015   CALCIUM 9.0 11/16/2015   GFRNONAA >60 11/16/2015   GFRAA >60 11/16/2015    Lab Results  Component Value Date   WBC 5.5 11/16/2015   HGB 11.3* 11/16/2015   HCT 34.5* 11/16/2015   MCV 84.7 11/16/2015   PLT 163 11/16/2015     STUDIES: Dg Chest 2 View  11/16/2015  CLINICAL DATA:  Left-sided chest pain for 1 month. Intermittent shortness of breath. EXAM: CHEST  2 VIEW COMPARISON:  11/05/2015 FINDINGS: Heart is upper limits normal in size. Continued opacity within the left upper lobe perihilar region, unchanged. Right lung is clear. No effusions. No acute bony abnormality. IMPRESSION: Stable left upper lobe perihilar airspace opacity, most likely pneumonia. Followup PA and lateral chest X-ray is recommended in 3-4 weeks following trial of antibiotic therapy to ensure resolution and exclude underlying malignancy. Electronically Signed   By: Rolm Baptise M.D.   On: 11/16/2015 07:37   Dg Chest 2 View  11/05/2015  CLINICAL DATA:  Chest pain for 2 weeks EXAM: CHEST  2 VIEW COMPARISON:  Chest radiograph February 21, 2015 and chest CT February 21, 2015 FINDINGS: There is airspace consolidation in the anterior segment of the left upper lobe. There is underlying interstitial prominence consistent with underlying fibrotic type change. Heart size and pulmonary vascular normal. No adenopathy. There is degenerative change in the thoracic spine. IMPRESSION: Infiltrate anterior segment left upper lobe. It should be noted that underlying mass in this area cannot be excluded radiographically. Followup PA and lateral chest radiographs recommended in 3-4 weeks following trial of antibiotic therapy to ensure resolution and exclude underlying malignancy. Underlying fibrotic type change. Stable cardiac silhouette.  Electronically Signed   By: Lowella Grip III M.D.   On: 11/05/2015 10:38   Ct Angio Chest Pe W/cm &/or Wo Cm  11/16/2015  CLINICAL DATA:  65 year old female with epigastric and lower chest pain with shortness of breath the past month. Recently diagnosed with pneumonia. EXAM: CT ANGIOGRAPHY CHEST WITH CONTRAST TECHNIQUE: Multidetector CT imaging of the chest was performed using the standard protocol during bolus administration of intravenous contrast. Multiplanar CT image reconstructions and MIPs were obtained to evaluate the vascular anatomy. CONTRAST:  184m OMNIPAQUE IOHEXOL 350 MG/ML SOLN COMPARISON:  Chest CT 02/21/2015. FINDINGS: Mediastinum/Lymph Nodes: No filling defects within the pulmonary arterial tree to suggest underlying pulmonary embolism. Previously noted adenopathy has significantly worsened. The largest nodal mass currently measures 6.5 x 2.9 cm (image 46 of series 4) in the anterior aspect of the left hilar region, increased from 3.7 x 2.8 cm on prior study 02/21/2015. Several other bulky left hilar lymph nodes are noted. Bilateral paratracheal lymphadenopathy is noted, measuring up to 11 mm in the low left paratracheal nodal station and 16 mm in the low right paratracheal nodal station. 11 mm high right paratracheal lymph node also noted. 12 mm right hilar lymph node also noted. Heart size is normal. There is no significant  pericardial fluid, thickening or pericardial calcification. There is atherosclerosis of the thoracic aorta, the great vessels of the mediastinum and the coronary arteries, including calcified atherosclerotic plaque in the left anterior descending, left circumflex and right coronary arteries. Esophagus is unremarkable in appearance. No axillary lymphadenopathy. Lungs/Pleura: Compared to the prior study there has been interval development of a 2.9 x 2.3 cm left upper lobe pulmonary nodule (image 38 of series 6). There continues to be a background of diffuse bronchial  wall thickening with mild centrilobular and paraseptal emphysema. Worsening multifocal thick walled cysts throughout the mid to upper lungs, in addition to multifocal tiny micronodules throughout the lungs bilaterally, favored to reflect advancement of a smoking related disease such pulmonary Langerhan's Cell Histiocytosis. Some mild postobstructive changes are noted in the medial aspect of the left upper lobe, presumably related to extrinsic compression of left upper lobe bronchi from the left hilar adenopathy and large left upper lobe nodule. No pleural effusions. Upper Abdomen: Mild thickening of the adrenal glands bilaterally appears unchanged, presumably adrenal hyperplasia. Sub cm low-attenuation lesion in segment 8 of the liver is incompletely characterized, but is unchanged, likely a small cyst. Musculoskeletal/Soft Tissues: There are no aggressive appearing lytic or blastic lesions noted in the visualized portions of the skeleton. Review of the MIP images confirms the above findings. IMPRESSION: 1. No evidence pulmonary embolism. 2. Interval development of a 2.9 x 2.3 cm left upper lobe pulmonary nodule with massive left hilar adenopathy, as well as additional enlarged lymph nodes in the right hilum and bilateral mediastinal nodal stations. 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 Further evaluation with PET-CT and/or biopsy is suggested in the near future for further diagnostic and staging purposes. 3. 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 4.  Atherosclerosis, including three-vessel coronary artery disease. 5. Additional incidental findings, as above. Electronically Signed   By: Vinnie Langton M.D.   On: 11/16/2015 12:32    ASSESSMENT: Lung mass.  PLAN:    1. Lung mass: CT results reviewed independently and reported as above with large  lung mass and mediastinal lymphadenopathy consistent with underlying malignancy. We will get a PET scan as well as referral to pulmonology for ENB for biopsy.  Patient will return to clinic several days after her biopsy to discuss the results, further diagnostic planning, and treatment planning. Presuming malignancy, patient also require imaging of her brain as well as port placement in preparation for chemotherapy. Given the disease extent, surgery is likely not an option although she may benefit from XRT and we will make a referral to radiation oncology in the near future. 2. Pain: Patient was given a prescription for oxycodone today. 3. Hypertension: Patient's blood pressure is elevated today. Continue current medications as prescribed. Monitor.  Approximately 45 minutes was spent in discussion of which greater than 50% was consultation.  Patient expressed understanding and was in agreement with this plan. She also understands that She can call clinic at any time with any questions, concerns, or complaints.   [name], MD   11/20/2015 3:54 PM

## 2015-11-23 ENCOUNTER — Other Ambulatory Visit: Payer: Self-pay | Admitting: *Deleted

## 2015-11-23 ENCOUNTER — Encounter
Admission: RE | Admit: 2015-11-23 | Discharge: 2015-11-23 | Disposition: A | Discharge status: Home / Self Care | Payer: PPO | Source: Ambulatory Visit | Attending: Internal Medicine | Admitting: Internal Medicine

## 2015-11-23 ENCOUNTER — Telehealth: Payer: Self-pay | Admitting: *Deleted

## 2015-11-23 DIAGNOSIS — R918 Other nonspecific abnormal finding of lung field: Secondary | ICD-10-CM

## 2015-11-23 MED ORDER — FENTANYL 25 MCG/HR TD PT72: 25.0000 ug | MEDICATED_PATCH | TRANSDERMAL | Status: DC

## 2015-11-23 NOTE — Patient Instructions (Signed)
  Your procedure is scheduled on: Wednesday 11/25/2015 Report to Day Surgery.  2ND FLOOR MEDICAL MALL ENTRANCE To find out your arrival time please call [phone] between 1PM - 3PM on Tuesday 11/24/2015.  Remember: Instructions that are not followed completely may result in serious medical risk, up to and including death, or upon the discretion of your surgeon and anesthesiologist your surgery may need to be rescheduled.    __X__ 1. Do not eat food or drink liquids after midnight. No gum chewing or hard candies.     __X__ 2. No Alcohol for 24 hours before or after surgery.   ____ 3. Bring all medications with you on the day of surgery if instructed.    __X__ 4. Notify your doctor if there is any change in your medical condition     (cold, fever, infections).     Do not wear jewelry, make-up, hairpins, clips or nail polish.  Do not wear lotions, powders, or perfumes. You may wear deodorant.  Do not shave 48 hours prior to surgery. Men may shave face and neck.  Do not bring valuables to the hospital.    [facility] is not responsible for any belongings or valuables.               Contacts, dentures or bridgework may not be worn into surgery.  Leave your suitcase in the car. After surgery it may be brought to your room.  For patients admitted to the hospital, discharge time is determined by your                treatment team.   Patients discharged the day of surgery will not be allowed to drive home.   Please read over the following fact sheets that you were given:   Surgical Site Infection Prevention   __X__ Take these medicines the morning of surgery with A SIP OF WATER:    1. LISINOPRIL  2. MAY TAKE OXYCODONE IF HAVING PAIN  3.   4.  5.  6.  ____ Fleet Enema (as directed)   ____ Use CHG Soap as directed  ____ Use inhalers on the day of surgery  __X__ Stop metformin 2 days prior to surgery    __X__ Take 1/2 of usual insulin dose the night before surgery and none on  the morning of surgery.   TAKE ONLY 30 UNITS Tuesday NIGHT  ____ Stop Coumadin/Plavix/aspirin on   ____ Stop Anti-inflammatories on    ____ Stop supplements until after surgery.    ____ Bring C-Pap to the hospital.

## 2015-11-23 NOTE — Telephone Encounter (Signed)
Notified that biopsy appt is changed from Wednesday to Thursday 11/26/15 with 12 noon arrival. Verbalized understanding.

## 2015-11-23 NOTE — Telephone Encounter (Signed)
Reports that current pain medication every 6 hours is not adequate and would like to know what else she can do/ take?

## 2015-11-23 NOTE — Telephone Encounter (Signed)
She can have 27mg fentanyl patch.

## 2015-11-23 NOTE — Telephone Encounter (Signed)
rx printed, patient aware prescription is ready for pick up.

## 2015-11-24 ENCOUNTER — Telehealth: Payer: Self-pay | Admitting: *Deleted

## 2015-11-24 NOTE — Telephone Encounter (Signed)
Cannot afford $75 co pay for Fentanyl patches, asking if there is any thing else cheaper we can order for her. I have placed a call to [facility] for assistance

## 2015-11-24 NOTE — Telephone Encounter (Signed)
Per Barnabas Lister, we will cover her Fentanyl and she was notified to get rx from [facility] and take to [facility]. She was very greatful and thanking me multiple times

## 2015-11-25 ENCOUNTER — Ambulatory Visit
Admission: RE | Admit: 2015-11-25 | Discharge: 2015-11-25 | Disposition: A | Discharge status: Home / Self Care | Payer: PPO | Source: Ambulatory Visit | Attending: Oncology | Admitting: Oncology

## 2015-11-25 ENCOUNTER — Telehealth: Payer: Self-pay | Admitting: *Deleted

## 2015-11-25 DIAGNOSIS — R918 Other nonspecific abnormal finding of lung field: Secondary | ICD-10-CM | POA: Diagnosis present

## 2015-11-25 DIAGNOSIS — R079 Chest pain, unspecified: Secondary | ICD-10-CM | POA: Diagnosis not present

## 2015-11-25 DIAGNOSIS — E278 Other specified disorders of adrenal gland: Secondary | ICD-10-CM | POA: Diagnosis not present

## 2015-11-25 DIAGNOSIS — Z79899 Other long term (current) drug therapy: Secondary | ICD-10-CM | POA: Diagnosis not present

## 2015-11-25 DIAGNOSIS — F17210 Nicotine dependence, cigarettes, uncomplicated: Secondary | ICD-10-CM | POA: Diagnosis not present

## 2015-11-25 DIAGNOSIS — Z90710 Acquired absence of both cervix and uterus: Secondary | ICD-10-CM | POA: Diagnosis not present

## 2015-11-25 DIAGNOSIS — I10 Essential (primary) hypertension: Secondary | ICD-10-CM | POA: Diagnosis not present

## 2015-11-25 DIAGNOSIS — C3412 Malignant neoplasm of upper lobe, left bronchus or lung: Secondary | ICD-10-CM | POA: Diagnosis not present

## 2015-11-25 DIAGNOSIS — Z794 Long term (current) use of insulin: Secondary | ICD-10-CM | POA: Diagnosis not present

## 2015-11-25 DIAGNOSIS — R0602 Shortness of breath: Secondary | ICD-10-CM | POA: Diagnosis not present

## 2015-11-25 DIAGNOSIS — I2510 Atherosclerotic heart disease of native coronary artery without angina pectoris: Secondary | ICD-10-CM | POA: Diagnosis not present

## 2015-11-25 DIAGNOSIS — E7800 Pure hypercholesterolemia, unspecified: Secondary | ICD-10-CM | POA: Diagnosis not present

## 2015-11-25 DIAGNOSIS — E119 Type 2 diabetes mellitus without complications: Secondary | ICD-10-CM | POA: Diagnosis not present

## 2015-11-25 DIAGNOSIS — R590 Localized enlarged lymph nodes: Secondary | ICD-10-CM | POA: Diagnosis not present

## 2015-11-25 DIAGNOSIS — C3402 Malignant neoplasm of left main bronchus: Secondary | ICD-10-CM

## 2015-11-25 LAB — GLUCOSE, CAPILLARY
Glucose-Capillary: 239 mg/dL — ABNORMAL HIGH (ref 65–99)
Glucose-Capillary: 239 mg/dL — ABNORMAL HIGH (ref 65–99)

## 2015-11-25 NOTE — Telephone Encounter (Signed)
Dr. Grayland Ormond made aware, patient to proceed with biopsy tomorrow and PET on Friday.

## 2015-11-25 NOTE — Telephone Encounter (Signed)
PET is rescheduled for Friday due to glucose being too high to do scan today

## 2015-11-26 ENCOUNTER — Encounter
Admission: RE | Disposition: A | Discharge status: Home / Self Care | Payer: Self-pay | Source: Ambulatory Visit | Attending: Internal Medicine

## 2015-11-26 ENCOUNTER — Ambulatory Visit: Payer: PPO

## 2015-11-26 ENCOUNTER — Encounter: Payer: Self-pay | Admitting: *Deleted

## 2015-11-26 ENCOUNTER — Ambulatory Visit
Admission: RE | Admit: 2015-11-26 | Discharge: 2015-11-26 | Disposition: A | Discharge status: Home / Self Care | Payer: PPO | Source: Ambulatory Visit | Attending: Internal Medicine | Admitting: Internal Medicine

## 2015-11-26 ENCOUNTER — Ambulatory Visit: Payer: PPO | Admitting: Anesthesiology

## 2015-11-26 DIAGNOSIS — Z90710 Acquired absence of both cervix and uterus: Secondary | ICD-10-CM | POA: Insufficient documentation

## 2015-11-26 DIAGNOSIS — I2510 Atherosclerotic heart disease of native coronary artery without angina pectoris: Secondary | ICD-10-CM | POA: Insufficient documentation

## 2015-11-26 DIAGNOSIS — E119 Type 2 diabetes mellitus without complications: Secondary | ICD-10-CM | POA: Insufficient documentation

## 2015-11-26 DIAGNOSIS — I10 Essential (primary) hypertension: Secondary | ICD-10-CM | POA: Insufficient documentation

## 2015-11-26 DIAGNOSIS — E7800 Pure hypercholesterolemia, unspecified: Secondary | ICD-10-CM | POA: Insufficient documentation

## 2015-11-26 DIAGNOSIS — C3412 Malignant neoplasm of upper lobe, left bronchus or lung: Secondary | ICD-10-CM | POA: Insufficient documentation

## 2015-11-26 DIAGNOSIS — F17210 Nicotine dependence, cigarettes, uncomplicated: Secondary | ICD-10-CM | POA: Insufficient documentation

## 2015-11-26 DIAGNOSIS — Z794 Long term (current) use of insulin: Secondary | ICD-10-CM | POA: Insufficient documentation

## 2015-11-26 DIAGNOSIS — E278 Other specified disorders of adrenal gland: Secondary | ICD-10-CM | POA: Insufficient documentation

## 2015-11-26 DIAGNOSIS — R918 Other nonspecific abnormal finding of lung field: Secondary | ICD-10-CM | POA: Diagnosis not present

## 2015-11-26 DIAGNOSIS — R079 Chest pain, unspecified: Secondary | ICD-10-CM | POA: Insufficient documentation

## 2015-11-26 DIAGNOSIS — R590 Localized enlarged lymph nodes: Secondary | ICD-10-CM | POA: Insufficient documentation

## 2015-11-26 DIAGNOSIS — Z79899 Other long term (current) drug therapy: Secondary | ICD-10-CM | POA: Insufficient documentation

## 2015-11-26 DIAGNOSIS — R0602 Shortness of breath: Secondary | ICD-10-CM | POA: Insufficient documentation

## 2015-11-26 HISTORY — PX: ELECTROMAGNETIC NAVIGATION BROCHOSCOPY: SHX5369

## 2015-11-26 HISTORY — PX: ENDOBRONCHIAL ULTRASOUND: SHX5096

## 2015-11-26 LAB — GLUCOSE, CAPILLARY
GLUCOSE-CAPILLARY: 171 mg/dL — AB (ref 65–99)
Glucose-Capillary: 167 mg/dL — ABNORMAL HIGH (ref 65–99)

## 2015-11-26 SURGERY — ENDOBRONCHIAL ULTRASOUND (EBUS)
Anesthesia: General

## 2015-11-26 MED ORDER — ONDANSETRON HCL 4 MG/2ML IJ SOLN
INTRAMUSCULAR | Status: DC | PRN
  Administered 2015-11-26: 4 mg via INTRAVENOUS

## 2015-11-26 MED ORDER — SUGAMMADEX SODIUM 200 MG/2ML IV SOLN
INTRAVENOUS | Status: DC | PRN
  Administered 2015-11-26: 200 mg via INTRAVENOUS

## 2015-11-26 MED ORDER — MIDAZOLAM HCL 2 MG/2ML IJ SOLN
INTRAMUSCULAR | Status: DC | PRN
  Administered 2015-11-26: 1 mg via INTRAVENOUS

## 2015-11-26 MED ORDER — PROPOFOL 10 MG/ML IV BOLUS
INTRAVENOUS | Status: DC | PRN
  Administered 2015-11-26: 200 mg via INTRAVENOUS

## 2015-11-26 MED ORDER — FENTANYL CITRATE (PF) 100 MCG/2ML IJ SOLN
25.0000 ug | INTRAMUSCULAR | Status: DC | PRN
  Administered 2015-11-26 (×4): 25 ug via INTRAVENOUS

## 2015-11-26 MED ORDER — FENTANYL CITRATE (PF) 100 MCG/2ML IJ SOLN
INTRAMUSCULAR | Status: AC
  Administered 2015-11-26: 25 ug via INTRAVENOUS
  Filled 2015-11-26: qty 2

## 2015-11-26 MED ORDER — LIDOCAINE HCL (CARDIAC) 20 MG/ML IV SOLN
INTRAVENOUS | Status: DC | PRN
  Administered 2015-11-26: 100 mg via INTRAVENOUS

## 2015-11-26 MED ORDER — PHENYLEPHRINE HCL 10 MG/ML IJ SOLN
INTRAMUSCULAR | Status: DC | PRN
  Administered 2015-11-26: 100 ug via INTRAVENOUS

## 2015-11-26 MED ORDER — FENTANYL CITRATE (PF) 100 MCG/2ML IJ SOLN
INTRAMUSCULAR | Status: DC | PRN
  Administered 2015-11-26 (×2): 50 ug via INTRAVENOUS

## 2015-11-26 MED ORDER — ONDANSETRON HCL 4 MG/2ML IJ SOLN: 4.0000 mg | Freq: Once | INTRAMUSCULAR | Status: DC | PRN

## 2015-11-26 MED ORDER — FAMOTIDINE 20 MG PO TABS
ORAL_TABLET | ORAL | Status: AC
  Administered 2015-11-26: 20 mg via ORAL
  Filled 2015-11-26: qty 1

## 2015-11-26 MED ORDER — SODIUM CHLORIDE 0.9 % IV SOLN
INTRAVENOUS | Status: DC
Start: 2015-11-26 — End: 2015-11-26
  Administered 2015-11-26: 13:00:00 via INTRAVENOUS

## 2015-11-26 MED ORDER — ROCURONIUM BROMIDE 100 MG/10ML IV SOLN
INTRAVENOUS | Status: DC | PRN
  Administered 2015-11-26: 40 mg via INTRAVENOUS
  Administered 2015-11-26: 10 mg via INTRAVENOUS

## 2015-11-26 MED ORDER — FAMOTIDINE 20 MG PO TABS
20.0000 mg | ORAL_TABLET | Freq: Once | ORAL | Status: AC
  Administered 2015-11-26: 20 mg via ORAL

## 2015-11-26 NOTE — Transfer of Care (Signed)
Immediate Anesthesia Transfer of Care Note  Patient: Susan Willis  Procedure(s) Performed: Procedure(s): ENDOBRONCHIAL ULTRASOUND (N/A) ELECTROMAGNETIC NAVIGATION BRONCHOSCOPY (N/A)  Patient Location: PACU  Anesthesia Type:General  Level of Consciousness: awake, alert , oriented and patient cooperative  Airway & Oxygen Therapy: Patient Spontanous Breathing and Patient connected to face mask oxygen  Post-op Assessment: Report given to RN, Post -op Vital signs reviewed and stable and Patient moving all extremities X 4  Post vital signs: Reviewed and stable  Last Vitals:  Filed Vitals:   11/26/15 1212  BP: 130/51  Pulse: 87  Temp: 36.6 C  Resp: 16    Complications: No apparent anesthesia complications

## 2015-11-26 NOTE — Anesthesia Postprocedure Evaluation (Signed)
Anesthesia Post Note  Patient: Susan Willis  Procedure(s) Performed: Procedure(s) (LRB): ENDOBRONCHIAL ULTRASOUND (N/A) ELECTROMAGNETIC NAVIGATION BRONCHOSCOPY (N/A)  Patient location during evaluation: PACU Anesthesia Type: General Level of consciousness: awake Pain management: pain level controlled Vital Signs Assessment: post-procedure vital signs reviewed and stable Respiratory status: nonlabored ventilation Cardiovascular status: blood pressure returned to baseline Anesthetic complications: no    Last Vitals:  Filed Vitals:   11/26/15 1212 11/26/15 1454  BP: 130/51 173/91  Pulse: 87 84  Temp: 36.6 C 36.3 C  Resp: 16 18    Last Pain: There were no vitals filed for this visit.               VAN STAVEREN,Anitra Doxtater

## 2015-11-26 NOTE — Anesthesia Preprocedure Evaluation (Signed)
Anesthesia Evaluation  Patient identified by MRN, date of birth, ID band Patient awake    Reviewed: Allergy & Precautions, NPO status , Patient's Chart, lab work & pertinent test results, reviewed documented beta blocker date and time   Airway Mallampati: III  TM Distance: >3 FB     Dental  (+) Chipped   Pulmonary Current Smoker,           Cardiovascular hypertension, Pt. on medications      Neuro/Psych    GI/Hepatic   Endo/Other  diabetesMorbid obesity  Renal/GU      Musculoskeletal  (+) Arthritis ,   Abdominal   Peds  Hematology   Anesthesia Other Findings   Reproductive/Obstetrics                             Anesthesia Physical Anesthesia Plan  ASA: III  Anesthesia Plan: General   Post-op Pain Management:    Induction: Intravenous  Airway Management Planned: Oral ETT  Additional Equipment:   Intra-op Plan:   Post-operative Plan:   Informed Consent: I have reviewed the patients History and Physical, chart, labs and discussed the procedure including the risks, benefits and alternatives for the proposed anesthesia with the patient or authorized representative who has indicated his/her understanding and acceptance.     Plan Discussed with: CRNA  Anesthesia Plan Comments:         Anesthesia Quick Evaluation

## 2015-11-26 NOTE — Op Note (Signed)
Electromagnetic Navigation Bronchoscopy: Indication: left lung mass  Preoperative Diagnosis:lung mass Post Procedure Diagnosis: mass Consent: verbal Risks and benefits explained in detail including risk of infection, bleeding, respiratory failure and death.   Hand washing performed prior to starting the procedure.   Type of Anesthesia: see Anesthesiology records .   Procedure Performed:  Virtual Bronchoscopy with Multi-planar Image analysis, 3-D reconstruction of coronal, sagittal and multi-planar images for the purposes of planning real-time bronchoscopy using the iLogic Electromagnetic Navigation Bronchoscopy System (superDimension)..   Description of Procedure: After obtaining informed consent from the patient, the above sedative and anesthetic measures were carried out, flexible fiberoptic bronchoscope was inserted via an oral bite block. Posterior pharynx was clear. The 2 vocal cords were easily traversed after application of local anesthetic.  The virtual camera was then placed into the central portion of the trachea. The trachea itself was inspected.  The main carina, right and left midstem bronchus and all the segmental and subsegmental airways by virtual bronchoscopy were brieftly inspected.  The camera was directed to standard registration points at the following centers: main carina, right upper lobe bronchus, right lower lobe bronchus, right middle lobe bronchus, left upper lobe bronchus, and the left lower lobe bronchus. This data was transferred to the i-Logic ENB system for real-time bronchoscopy.  The scope was navigated to LUL mass, where sampling was taken  Specimans Obtained:  TRANSBRONCHIAL Fine Needle Aspirations 21G times:3  TRANSBRONCHIAL Forceps Biopsy times:1    Fluoroscopy:  Fluoroscopy was utilized during the course of this procedure to assure that biopsies were taken in a safe manner under fluoroscopic guidance with no spot films required.    Complications:none  Estimated Blood Loss: minimal  Monitoring:  The patient was monitored with continuous oximetry and received supplemental nasal cannula oxygen throughout the procedure. In addition, serial blood pressure measurements and continuous electrocardiography showed these physiologic parameters to remain tolerable throughout the procedure.   Assessment and Plan/Additional Comments:follow up pathology reports    Corrin Parker, M.D.  [facility]  Medical Director [place] Director [facility] Cardio-Pulmonary Department

## 2015-11-26 NOTE — Discharge Instructions (Signed)
Flexible Bronchoscopy, Care After Refer to this sheet in the next few weeks. These instructions provide you with information on caring for yourself after your procedure. Your health care provider may also give you more specific instructions. Your treatment has been planned according to current medical practices, but problems sometimes occur. Call your health care provider if you have any problems or questions after your procedure.  WHAT TO EXPECT AFTER THE PROCEDURE It is normal to have the following symptoms for 24-48 hours after the procedure:   Increased cough.  Low-grade fever.  Sore throat or hoarse voice.  Small streaks of blood in your thick spit (sputum) if tissue samples were taken (biopsy). HOME CARE INSTRUCTIONS   Do not eat or drink anything for 2 hours after your procedure. Your nose and throat were numbed by medicine. If you try to eat or drink before the medicine wears off, food or drink could go into your lungs or you could burn yourself. After the numbness is gone and your cough and gag reflexes have returned, you may eat soft food and drink liquids slowly.   The day after the procedure, you can go back to your normal diet.   You may resume normal activities.   Keep all follow-up visits as directed by your health care provider. It is important to keep all your appointments, especially if tissue samples were taken for testing (biopsy). SEEK IMMEDIATE MEDICAL CARE IF:   You have increasing shortness of breath.   You become light-headed or faint.   You have chest pain.   You have any new concerning symptoms.  You cough up more than a small amount of blood.  The amount of blood you cough up increases. MAKE SURE YOU:  Understand these instructions.  Will watch your condition.  Will get help right away if you are not doing well or get worse.   This information is not intended to replace advice given to you by your health care provider. Make sure you discuss  any questions you have with your health care provider.   Document Released: 06/10/2005 Document Revised: 12/12/2014 Document Reviewed: 07/26/2013 Elsevier Interactive Patient Education 2016 [place]   1) The drugs that you were given will stay in your system until tomorrow so for the next 24 hours you should not:  A) Drive an automobile B) Make any legal decisions C) Drink any alcoholic beverage   2) You may resume regular meals tomorrow.  Today it is better to start with liquids and gradually work up to solid foods.  You may eat anything you prefer, but it is better to start with liquids, then soup and crackers, and gradually work up to solid foods.   3) Please notify your doctor immediately if you have any unusual bleeding, trouble breathing, redness and pain at the surgery site, drainage, fever, or pain not relieved by medication.    4) Additional Instructions:        Please contact your physician with any problems or Same Day Surgery at [phone], Monday through Friday 6 am to 4 pm, or [place] at [facility] number at [phone].

## 2015-11-26 NOTE — H&P (View-Only) (Signed)
Susan Willis  [phone] [phone]) [phone]  ID: [name] OB: 05-Jul-1951  MR#: [id number]  UXN#:[id number]  Patient Care Team: Marguerita Merles, MD as PCP - General (Family Medicine)  CHIEF COMPLAINT: Lung mass.  INTERVAL HISTORY: Patient is a 64 year old female who recently presented to the emergency room with left-sided chest pain. Workup included a CT scan which showed a large left sided lung mass with lymphadenopathy. She otherwise feels well. She has no neurologic complaints. She denies any recent fevers or illnesses. She has a fair appetite and denies weight loss. She denies any shortness of breath, cough, or hemoptysis. She has no nausea, vomiting, constipation, or diarrhea. She has no urinary complaints. Patient otherwise feels well and offers no further specific complaints.   REVIEW OF SYSTEMS:   Review of Systems  Constitutional: Negative for fever, weight loss and malaise/fatigue.  Respiratory: Negative for cough, hemoptysis and shortness of breath.   Cardiovascular: Positive for chest pain. Negative for leg swelling.  Gastrointestinal: Negative.   Musculoskeletal: Negative.   Neurological: Negative.  Negative for weakness.    As per HPI. Otherwise, a complete review of systems is negatve.  PAST MEDICAL HISTORY: Past Medical History  Diagnosis Date  . Arthritis   . Diabetes mellitus without complication ([place])   . Hypertension   . High cholesterol     PAST SURGICAL HISTORY: Past Surgical History  Procedure Laterality Date  . Abdominal hysterectomy    . Cesarean section      FAMILY HISTORY: Reviewed and unchanged. No reported history of malignancy or chronic disease.     ADVANCED DIRECTIVES:    HEALTH MAINTENANCE: Social History  Substance Use Topics  . Smoking status: Current Every Day Smoker -- 2.00 packs/day for 35 years    Types: Cigarettes  . Smokeless tobacco: Never Used  . Alcohol Use: No      Colonoscopy:  PAP:  Bone density:  Lipid panel:  No Known Allergies  Current Outpatient Prescriptions  Medication Sig Dispense Refill  . atorvastatin (LIPITOR) 20 MG tablet Take 20 mg by mouth daily at 6 PM.    . insulin glargine (LANTUS) 100 UNIT/ML injection Inject 60 Units into the skin at bedtime.    Marland Kitchen linagliptin (TRADJENTA) 5 MG TABS tablet Take 5 mg by mouth daily.    Marland Kitchen lisinopril (PRINIVIL,ZESTRIL) 10 MG tablet Take 10 mg by mouth daily.    . metFORMIN (GLUMETZA) 500 MG (MOD) 24 hr tablet Take 1,000 mg by mouth 2 (two) times daily with a meal.    . doxycycline (VIBRAMYCIN) 100 MG capsule Take 1 capsule (100 mg) by mouth twice daily for 10 days. (Patient not taking: Reported on 11/19/2015) 20 capsule 0  . Oxycodone HCl 10 MG TABS Take 1 tablet (10 mg total) by mouth every 6 (six) hours as needed. 60 tablet 0  . oxyCODONE-acetaminophen (ROXICET) 5-325 MG tablet Take 1 tablet by mouth every 4 (four) hours as needed for severe pain. (Patient not taking: Reported on 11/19/2015) 6 tablet 0   No current facility-administered medications for this visit.    OBJECTIVE: Filed Vitals:   11/19/15 1346  BP: 178/94  Pulse: 80  Temp: 97.7 F (36.5 C)  Resp: 20     Body mass index is 40.57 kg/(m^2).    ECOG FS:1 - Symptomatic but completely ambulatory  General: Well-developed, well-nourished, no acute distress. Eyes: Pink conjunctiva, anicteric sclera. HEENT: Normocephalic, moist mucous membranes, clear oropharnyx. Lungs: Clear to auscultation bilaterally. Heart: Regular rate  and rhythm. No rubs, murmurs, or gallops. Abdomen: Soft, nontender, nondistended. No organomegaly noted, normoactive bowel sounds. Musculoskeletal: No edema, cyanosis, or clubbing. Neuro: Alert, answering all questions appropriately. Cranial nerves grossly intact. Skin: No rashes or petechiae noted. Psych: Normal affect. Lymphatics: No cervical, calvicular, axillary or inguinal LAD.   LAB  RESULTS:  Lab Results  Component Value Date   NA 139 11/16/2015   K 3.4* 11/16/2015   CL 107 11/16/2015   CO2 28 11/16/2015   GLUCOSE 270* 11/16/2015   BUN 8 11/16/2015   CREATININE 0.62 11/16/2015   CALCIUM 9.0 11/16/2015   GFRNONAA >60 11/16/2015   GFRAA >60 11/16/2015    Lab Results  Component Value Date   WBC 5.5 11/16/2015   HGB 11.3* 11/16/2015   HCT 34.5* 11/16/2015   MCV 84.7 11/16/2015   PLT 163 11/16/2015     STUDIES: Dg Chest 2 View  11/16/2015  CLINICAL DATA:  Left-sided chest pain for 1 month. Intermittent shortness of breath. EXAM: CHEST  2 VIEW COMPARISON:  11/05/2015 FINDINGS: Heart is upper limits normal in size. Continued opacity within the left upper lobe perihilar region, unchanged. Right lung is clear. No effusions. No acute bony abnormality. IMPRESSION: Stable left upper lobe perihilar airspace opacity, most likely pneumonia. Followup PA and lateral chest X-ray is recommended in 3-4 weeks following trial of antibiotic therapy to ensure resolution and exclude underlying malignancy. Electronically Signed   By: Rolm Baptise M.D.   On: 11/16/2015 07:37   Dg Chest 2 View  11/05/2015  CLINICAL DATA:  Chest pain for 2 weeks EXAM: CHEST  2 VIEW COMPARISON:  Chest radiograph February 21, 2015 and chest CT February 21, 2015 FINDINGS: There is airspace consolidation in the anterior segment of the left upper lobe. There is underlying interstitial prominence consistent with underlying fibrotic type change. Heart size and pulmonary vascular normal. No adenopathy. There is degenerative change in the thoracic spine. IMPRESSION: Infiltrate anterior segment left upper lobe. It should be noted that underlying mass in this area cannot be excluded radiographically. Followup PA and lateral chest radiographs recommended in 3-4 weeks following trial of antibiotic therapy to ensure resolution and exclude underlying malignancy. Underlying fibrotic type change. Stable cardiac silhouette.  Electronically Signed   By: Lowella Grip III M.D.   On: 11/05/2015 10:38   Ct Angio Chest Pe W/cm &/or Wo Cm  11/16/2015  CLINICAL DATA:  64 year old female with epigastric and lower chest pain with shortness of breath the past month. Recently diagnosed with pneumonia. EXAM: CT ANGIOGRAPHY CHEST WITH CONTRAST TECHNIQUE: Multidetector CT imaging of the chest was performed using the standard protocol during bolus administration of intravenous contrast. Multiplanar CT image reconstructions and MIPs were obtained to evaluate the vascular anatomy. CONTRAST:  192m OMNIPAQUE IOHEXOL 350 MG/ML SOLN COMPARISON:  Chest CT 02/21/2015. FINDINGS: Mediastinum/Lymph Nodes: No filling defects within the pulmonary arterial tree to suggest underlying pulmonary embolism. Previously noted adenopathy has significantly worsened. The largest nodal mass currently measures 6.5 x 2.9 cm (image 46 of series 4) in the anterior aspect of the left hilar region, increased from 3.7 x 2.8 cm on prior study 02/21/2015. Several other bulky left hilar lymph nodes are noted. Bilateral paratracheal lymphadenopathy is noted, measuring up to 11 mm in the low left paratracheal nodal station and 16 mm in the low right paratracheal nodal station. 11 mm high right paratracheal lymph node also noted. 12 mm right hilar lymph node also noted. Heart size is normal. There is no significant  pericardial fluid, thickening or pericardial calcification. There is atherosclerosis of the thoracic aorta, the great vessels of the mediastinum and the coronary arteries, including calcified atherosclerotic plaque in the left anterior descending, left circumflex and right coronary arteries. Esophagus is unremarkable in appearance. No axillary lymphadenopathy. Lungs/Pleura: Compared to the prior study there has been interval development of a 2.9 x 2.3 cm left upper lobe pulmonary nodule (image 38 of series 6). There continues to be a background of diffuse bronchial  wall thickening with mild centrilobular and paraseptal emphysema. Worsening multifocal thick walled cysts throughout the mid to upper lungs, in addition to multifocal tiny micronodules throughout the lungs bilaterally, favored to reflect advancement of a smoking related disease such pulmonary Langerhan's Cell Histiocytosis. Some mild postobstructive changes are noted in the medial aspect of the left upper lobe, presumably related to extrinsic compression of left upper lobe bronchi from the left hilar adenopathy and large left upper lobe nodule. No pleural effusions. Upper Abdomen: Mild thickening of the adrenal glands bilaterally appears unchanged, presumably adrenal hyperplasia. Sub cm low-attenuation lesion in segment 8 of the liver is incompletely characterized, but is unchanged, likely a small cyst. Musculoskeletal/Soft Tissues: There are no aggressive appearing lytic or blastic lesions noted in the visualized portions of the skeleton. Review of the MIP images confirms the above findings. IMPRESSION: 1. No evidence pulmonary embolism. 2. Interval development of a 2.9 x 2.3 cm left upper lobe pulmonary nodule with massive left hilar adenopathy, as well as additional enlarged lymph nodes in the right hilum and bilateral mediastinal nodal stations. 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 Further evaluation with PET-CT and/or biopsy is suggested in the near future for further diagnostic and staging purposes. 3. 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 4.  Atherosclerosis, including three-vessel coronary artery disease. 5. Additional incidental findings, as above. Electronically Signed   By: Vinnie Langton M.D.   On: 11/16/2015 12:32    ASSESSMENT: Lung mass.  PLAN:    1. Lung mass: CT results reviewed independently and reported as above with large  lung mass and mediastinal lymphadenopathy consistent with underlying malignancy. We will get a PET scan as well as referral to pulmonology for ENB for biopsy.  Patient will return to clinic several days after her biopsy to discuss the results, further diagnostic planning, and treatment planning. Presuming malignancy, patient also require imaging of her brain as well as port placement in preparation for chemotherapy. Given the disease extent, surgery is likely not an option although she may benefit from XRT and we will make a referral to radiation oncology in the near future. 2. Pain: Patient was given a prescription for oxycodone today. 3. Hypertension: Patient's blood pressure is elevated today. Continue current medications as prescribed. Monitor.  Approximately 45 minutes was spent in discussion of which greater than 50% was consultation.  Patient expressed understanding and was in agreement with this plan. She also understands that She can call clinic at any time with any questions, concerns, or complaints.   [name], MD   11/20/2015 3:54 PM

## 2015-11-26 NOTE — Anesthesia Procedure Notes (Signed)
Procedure Name: Intubation Date/Time: 11/26/2015 1:27 PM Performed by: Silvana Newness Pre-anesthesia Checklist: Patient identified, Emergency Drugs available, Suction available, Patient being monitored and Timeout performed Patient Re-evaluated:Patient Re-evaluated prior to inductionOxygen Delivery Method: Circle system utilized Preoxygenation: Pre-oxygenation with 100% oxygen Intubation Type: IV induction Ventilation: Two handed mask ventilation required Laryngoscope Size: Mac and 3 Grade View: Grade II Tube type: Oral Tube size: 8.0 mm Number of attempts: 1 Airway Equipment and Method: Rigid stylet Placement Confirmation: ETT inserted through vocal cords under direct vision,  positive ETCO2 and breath sounds checked- equal and bilateral Secured at: 21 cm Tube secured with: Tape Dental Injury: Teeth and Oropharynx as per pre-operative assessment  Comments: Intubated by Dr. [place] Service

## 2015-11-26 NOTE — Interval H&P Note (Signed)
History and Physical Interval Note:  11/26/2015 1:03 PM  Susan Willis  has presented today for surgery, with the diagnosis of LEFT UPPER LOBE NODULE WITH ADENOPATHY  The various methods of treatment have been discussed with the patient and family. After consideration of risks, benefits and other options for treatment, the patient has consented to  Procedure(s): ENDOBRONCHIAL ULTRASOUND (N/A) ELECTROMAGNETIC NAVIGATION BRONCHOSCOPY (N/A) as a surgical intervention .  The patient's history has been reviewed, patient examined, no change in status, stable for surgery.  I have reviewed the patient's chart and labs.  Questions were answered to the patient's satisfaction.     Flora Lipps

## 2015-11-27 ENCOUNTER — Encounter: Payer: Self-pay | Admitting: Internal Medicine

## 2015-11-27 ENCOUNTER — Encounter
Admission: RE | Admit: 2015-11-27 | Discharge: 2015-11-27 | Disposition: A | Discharge status: Home / Self Care | Payer: PPO | Source: Ambulatory Visit | Attending: Oncology | Admitting: Oncology

## 2015-11-27 DIAGNOSIS — C3402 Malignant neoplasm of left main bronchus: Secondary | ICD-10-CM | POA: Insufficient documentation

## 2015-11-27 LAB — GLUCOSE, CAPILLARY: Glucose-Capillary: 194 mg/dL — ABNORMAL HIGH (ref 65–99)

## 2015-11-27 MED ORDER — FLUDEOXYGLUCOSE F - 18 (FDG) INJECTION
12.1200 | Freq: Once | INTRAVENOUS | Status: AC | PRN
  Administered 2015-11-27: 12.12 via INTRAVENOUS

## 2015-11-27 NOTE — Op Note (Signed)
[facility] Patient Name: Susan Willis Procedure Date: 11/26/2015 12:49 PM MRN: [id number] Account #: [id number] Date of Birth: [birth year] Admit Type: Outpatient Age: 64 Room: Bronch Suite on 2nd floor Gender: Female Note Status: Finalized Attending MD: Patricia Pesa, MD Procedure:         Bronchoscopy/ebus Indications:       Left upper lobe mass Providers:         Patricia Pesa, MD, Lang Snow, RCP (Technician) Referring MD:       Medicines:         Monitored Anesthesia Care Complications:     No immediate complications Procedure:         Pre-Anesthesia Assessment:                    - A History and Physical has been performed. The patient's                     medications, allergies and sensitivities have been                     reviewed.                    After obtaining informed consent, the bronchoscope was                     passed under direct vision. Throughout the procedure, the                     patient's blood pressure, pulse, and oxygen saturations                     were monitored continuously. the Bronchoscope Olympus                     [id number] was introduced through the mouth, via                     the endotracheal tube and advanced to the left lung only.                     The procedure was accomplished without difficulty. The                     patient tolerated the procedure well.                    I first used regualr scope and saw endobronchial mass in                     LUL, I was not able to reach the mass, then proceeded to                     undergo ENB. I also performed EBUS after which I was able                     to visualize the mass approx 3x4CM. Findings:      Left Lung Abnormalities: A completely obstructing mass was found       proximally in the left upper lobe. The mass was medium-sized and bloody,       endobronchial, exophytic and friable. The lesion was not traversed.      Transbronchial biopsies were  performed in the anterior segment of the  left upper lobe using a 21 gauge Wang needle and sent for routine       cytology. The procedure was guided by ultrasound. Four biopsy passes       were performed. Four biopsy samples were obtained. I was able to       visualize mass under [place]. Impression:        - A bloody, endobronchial, exophytic and friable mass was                     found in the left upper lobe under direct observation then                     EBUS was perfromed                    - Transbronchial lung biopsies were performed. Recommendation:    - Await biopsy results. Patricia Pesa MD Patricia Pesa, MD 11/26/2015 2:42:34 PM This report has been signed electronically. Number of Addenda: 0 Note Initiated On: 11/26/2015 12:49 PM      [facility]

## 2015-12-01 LAB — CYTOLOGY - NON PAP

## 2015-12-01 LAB — SURGICAL PATHOLOGY

## 2015-12-03 ENCOUNTER — Inpatient Hospital Stay (HOSPITAL_BASED_OUTPATIENT_CLINIC_OR_DEPARTMENT_OTHER): Payer: PPO | Admitting: Oncology

## 2015-12-03 VITALS — BP 146/78 | HR 82 | Temp 96.2°F | Resp 18 | Wt 240.5 lb

## 2015-12-03 DIAGNOSIS — R590 Localized enlarged lymph nodes: Secondary | ICD-10-CM | POA: Diagnosis not present

## 2015-12-03 DIAGNOSIS — I10 Essential (primary) hypertension: Secondary | ICD-10-CM | POA: Diagnosis not present

## 2015-12-03 DIAGNOSIS — C3412 Malignant neoplasm of upper lobe, left bronchus or lung: Secondary | ICD-10-CM

## 2015-12-03 DIAGNOSIS — F17210 Nicotine dependence, cigarettes, uncomplicated: Secondary | ICD-10-CM | POA: Diagnosis not present

## 2015-12-03 DIAGNOSIS — Z79899 Other long term (current) drug therapy: Secondary | ICD-10-CM

## 2015-12-03 NOTE — Progress Notes (Signed)
Patient no longer has pain and is only taking the Oxycodone on a prn basis.

## 2015-12-04 NOTE — Patient Instructions (Signed)

## 2015-12-07 DIAGNOSIS — C3412 Malignant neoplasm of upper lobe, left bronchus or lung: Secondary | ICD-10-CM | POA: Insufficient documentation

## 2015-12-07 NOTE — Progress Notes (Signed)
[place]  [phone] Fax:([phone]) [phone]  ID: [name] OB: August 22, 1951  MR#: [id number]  ZDG#:[id number]  Patient Care Team: Marguerita Merles, MD as PCP - General (Family Medicine)  CHIEF COMPLAINT: Lung mass.  INTERVAL HISTORY: Patient returns to clinic today for further evaluation, discussion of her pathology and imaging results, and treatment planning. She currently feels well and is asymptomatic. She does not complain of any further chest pain. She has no neurologic complaints. She denies any recent fevers or illnesses. She has a fair appetite and denies weight loss. She denies any shortness of breath, cough, or hemoptysis. She has no nausea, vomiting, constipation, or diarrhea. She has no urinary complaints. Patient no specific complaints today.   REVIEW OF SYSTEMS:   Review of Systems  Constitutional: Negative for fever, weight loss and malaise/fatigue.  Respiratory: Negative for cough, hemoptysis and shortness of breath.   Cardiovascular: Negative for chest pain and leg swelling.  Gastrointestinal: Negative.   Musculoskeletal: Negative.   Neurological: Negative.  Negative for weakness.    As per HPI. Otherwise, a complete review of systems is negatve.  PAST MEDICAL HISTORY: Past Medical History  Diagnosis Date  . Arthritis   . Diabetes mellitus without complication ([place])   . Hypertension   . High cholesterol     PAST SURGICAL HISTORY: Past Surgical History  Procedure Laterality Date  . Abdominal hysterectomy    . Cesarean section    . Endobronchial ultrasound N/A 11/26/2015    Procedure: ENDOBRONCHIAL ULTRASOUND;  Surgeon: Flora Lipps, MD;  Location: [facility] ORS;  Service: Cardiopulmonary;  Laterality: N/A;  . Electromagnetic navigation brochoscopy N/A 11/26/2015    Procedure: ELECTROMAGNETIC NAVIGATION BRONCHOSCOPY;  Surgeon: Flora Lipps, MD;  Location: [facility] ORS;  Service: Cardiopulmonary;  Laterality: N/A;    FAMILY HISTORY:  Reviewed and unchanged. No reported history of malignancy or chronic disease.     ADVANCED DIRECTIVES:    HEALTH MAINTENANCE: Social History  Substance Use Topics  . Smoking status: Current Every Day Smoker -- 2.00 packs/day for 35 years    Types: Cigarettes  . Smokeless tobacco: Never Used  . Alcohol Use: No     Colonoscopy:  PAP:  Bone density:  Lipid panel:  No Known Allergies  Current Outpatient Prescriptions  Medication Sig Dispense Refill  . atorvastatin (LIPITOR) 20 MG tablet Take 20 mg by mouth daily at 6 PM.    . insulin glargine (LANTUS) 100 UNIT/ML injection Inject 60 Units into the skin at bedtime.    Marland Kitchen linagliptin (TRADJENTA) 5 MG TABS tablet Take 5 mg by mouth daily.    Marland Kitchen lisinopril (PRINIVIL,ZESTRIL) 10 MG tablet Take 10 mg by mouth daily.    . metFORMIN (GLUMETZA) 500 MG (MOD) 24 hr tablet Take 1,000 mg by mouth 2 (two) times daily with a meal.    . Oxycodone HCl 10 MG TABS Take 1 tablet (10 mg total) by mouth every 6 (six) hours as needed. 60 tablet 0  . doxycycline (VIBRAMYCIN) 100 MG capsule Take 1 capsule (100 mg) by mouth twice daily for 10 days. (Patient not taking: Reported on 11/19/2015) 20 capsule 0  . fentaNYL (DURAGESIC - DOSED MCG/HR) 25 MCG/HR patch Place 1 patch (25 mcg total) onto the skin every 3 (three) days. (Patient not taking: Reported on 12/03/2015) 10 patch 0  . oxyCODONE-acetaminophen (ROXICET) 5-325 MG tablet Take 1 tablet by mouth every 4 (four) hours as needed for severe pain. (Patient not taking: Reported on 12/03/2015) 6 tablet 0  No current facility-administered medications for this visit.    OBJECTIVE: Filed Vitals:   12/03/15 1056  BP: 146/78  Pulse: 82  Temp: 96.2 F (35.7 C)  Resp: 18     Body mass index is 40.02 kg/(m^2).    ECOG FS:0 - Asymptomatic  General: Well-developed, well-nourished, no acute distress. Eyes: Pink conjunctiva, anicteric sclera. Lungs: Clear to auscultation bilaterally. Heart: Regular rate and  rhythm. No rubs, murmurs, or gallops. Abdomen: Soft, nontender, nondistended. No organomegaly noted, normoactive bowel sounds. Musculoskeletal: No edema, cyanosis, or clubbing. Neuro: Alert, answering all questions appropriately. Cranial nerves grossly intact. Skin: No rashes or petechiae noted. Psych: Normal affect.   LAB RESULTS:  Lab Results  Component Value Date   NA 139 11/16/2015   K 3.4* 11/16/2015   CL 107 11/16/2015   CO2 28 11/16/2015   GLUCOSE 270* 11/16/2015   BUN 8 11/16/2015   CREATININE 0.62 11/16/2015   CALCIUM 9.0 11/16/2015   GFRNONAA >60 11/16/2015   GFRAA >60 11/16/2015    Lab Results  Component Value Date   WBC 5.5 11/16/2015   HGB 11.3* 11/16/2015   HCT 34.5* 11/16/2015   MCV 84.7 11/16/2015   PLT 163 11/16/2015     STUDIES: Dg Chest 2 View  11/16/2015  CLINICAL DATA:  Left-sided chest pain for 1 month. Intermittent shortness of breath. EXAM: CHEST  2 VIEW COMPARISON:  11/05/2015 FINDINGS: Heart is upper limits normal in size. Continued opacity within the left upper lobe perihilar region, unchanged. Right lung is clear. No effusions. No acute bony abnormality. IMPRESSION: Stable left upper lobe perihilar airspace opacity, most likely pneumonia. Followup PA and lateral chest X-ray is recommended in 3-4 weeks following trial of antibiotic therapy to ensure resolution and exclude underlying malignancy. Electronically Signed   By: Rolm Baptise M.D.   On: 11/16/2015 07:37   Ct Angio Chest Pe W/cm &/or Wo Cm  11/16/2015  CLINICAL DATA:  65 year old female with epigastric and lower chest pain with shortness of breath the past month. Recently diagnosed with pneumonia. EXAM: CT ANGIOGRAPHY CHEST WITH CONTRAST TECHNIQUE: Multidetector CT imaging of the chest was performed using the standard protocol during bolus administration of intravenous contrast. Multiplanar CT image reconstructions and MIPs were obtained to evaluate the vascular anatomy. CONTRAST:  188m  OMNIPAQUE IOHEXOL 350 MG/ML SOLN COMPARISON:  Chest CT 02/21/2015. FINDINGS: Mediastinum/Lymph Nodes: No filling defects within the pulmonary arterial tree to suggest underlying pulmonary embolism. Previously noted adenopathy has significantly worsened. The largest nodal mass currently measures 6.5 x 2.9 cm (image 46 of series 4) in the anterior aspect of the left hilar region, increased from 3.7 x 2.8 cm on prior study 02/21/2015. Several other bulky left hilar lymph nodes are noted. Bilateral paratracheal lymphadenopathy is noted, measuring up to 11 mm in the low left paratracheal nodal station and 16 mm in the low right paratracheal nodal station. 11 mm high right paratracheal lymph node also noted. 12 mm right hilar lymph node also noted. Heart size is normal. There is no significant pericardial fluid, thickening or pericardial calcification. There is atherosclerosis of the thoracic aorta, the great vessels of the mediastinum and the coronary arteries, including calcified atherosclerotic plaque in the left anterior descending, left circumflex and right coronary arteries. Esophagus is unremarkable in appearance. No axillary lymphadenopathy. Lungs/Pleura: Compared to the prior study there has been interval development of a 2.9 x 2.3 cm left upper lobe pulmonary nodule (image 38 of series 6). There continues to be a background of  diffuse bronchial wall thickening with mild centrilobular and paraseptal emphysema. Worsening multifocal thick walled cysts throughout the mid to upper lungs, in addition to multifocal tiny micronodules throughout the lungs bilaterally, favored to reflect advancement of a smoking related disease such pulmonary Langerhan's Cell Histiocytosis. Some mild postobstructive changes are noted in the medial aspect of the left upper lobe, presumably related to extrinsic compression of left upper lobe bronchi from the left hilar adenopathy and large left upper lobe nodule. No pleural effusions. Upper  Abdomen: Mild thickening of the adrenal glands bilaterally appears unchanged, presumably adrenal hyperplasia. Sub cm low-attenuation lesion in segment 8 of the liver is incompletely characterized, but is unchanged, likely a small cyst. Musculoskeletal/Soft Tissues: There are no aggressive appearing lytic or blastic lesions noted in the visualized portions of the skeleton. Review of the MIP images confirms the above findings. IMPRESSION: 1. No evidence pulmonary embolism. 2. Interval development of a 2.9 x 2.3 cm left upper lobe pulmonary nodule with massive left hilar adenopathy, as well as additional enlarged lymph nodes in the right hilum and bilateral mediastinal nodal stations. 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 Further evaluation with PET-CT and/or biopsy is suggested in the near future for further diagnostic and staging purposes. 3. 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 4.  Atherosclerosis, including three-vessel coronary artery disease. 5. Additional incidental findings, as above. Electronically Signed   By: Vinnie Langton M.D.   On: 11/16/2015 12:32   Nm Pet Image Initial (pi) Skull Base To Thigh  11/27/2015  CLINICAL DATA:  Initial treatment strategy for left upper lobe lung nodule with bilateral hilar and mediastinal lymphadenopathy detected on recent chest CT angiogram. The patient underwent bronchoscopic biopsy 1 day prior, with pathology results pending. EXAM: NUCLEAR MEDICINE PET SKULL BASE TO THIGH TECHNIQUE: 12.1 mCi F-18 FDG was injected intravenously. Full-ring PET imaging was performed from the skull base to thigh after the radiotracer. CT data was obtained and used for attenuation correction and anatomic localization. FASTING BLOOD GLUCOSE:  Value: 194 mg/dl COMPARISON:  11/16/2015 chest CT angiogram. FINDINGS: NECK No  hypermetabolic lymph nodes in the neck. There is an osteoma in the right frontal sinus. CHEST There is a hypermetabolic 2.8 x 2.3 cm left upper lobe pulmonary nodule (series 3/image 60) with max SUV 14.1. There is bulky confluent ipsilateral peribronchial and ipsilateral hilar lymphadenopathy, for example a large hypermetabolic 6.6 x 3.8 cm ipsilateral peribronchial node in the central medial left upper lobe (series 3/image 73) with max SUV 11.2, which demonstrates a long base of attachment to the left mediastinal pleural surface, cannot exclude superficial left mediastinal invasion. There is a separate hypermetabolic 3.3 cm ipsilateral peribronchial node in the left upper lobe (series 3/image 74) with max SUV 16.9. There is a 2.4 cm hypermetabolic left hilar node (series 3/ image 83) with max SUV 12.1. There is no hypermetabolic mediastinal or contralateral hilar lymphadenopathy. Re- demonstrated is extensive ground-glass centrilobular nodularity throughout both lungs, upper lung predominant, with cavitary change in several of the small nodules (better visualized on the 11/16/2015 chest CT angiogram study), not appreciably changed and without associated hypermetabolism. No acute consolidative airspace disease or new hypermetabolic pulmonary nodules. No hypermetabolic axillary nodes. Heart appears borderline enlarged and slightly increased in size in the interval. Left anterior descending and right coronary atherosclerosis. ABDOMEN/PELVIS No abnormal hypermetabolic activity within the liver, pancreas, adrenal glands, or  spleen. No hypermetabolic lymph nodes in the abdomen or pelvis. Scattered mild colonic diverticulosis. Status post hysterectomy. SKELETON No focal hypermetabolic activity to suggest skeletal metastasis. IMPRESSION: 1. Hypermetabolic 2.8 x 2.3 cm left upper lobe pulmonary nodule, in keeping with a primary bronchogenic carcinoma. 2. Bulky hypermetabolic ipsilateral peribronchial and ipsilateral hilar  lymphadenopathy. Cannot exclude superficial invasion of the left mediastinum by the dominant 6.6 cm ipsilateral peribronchial nodal metastasis. 3. No hypermetabolic mediastinal or contralateral hilar lymphadenopathy. No hypermetabolic extrathoracic metastatic disease. 4. Interval stability of extensive upper lung predominant ground-glass centrilobular nodularity with variable cavitary change within the nodules, better visualized on the recent chest CT angiogram study, most suggestive of Langerhans cell histiocytosis as suggested on the 11/16/2015 chest CT report. 5. Borderline cardiomegaly, increased in size. Coronary atherosclerosis. Electronically Signed   By: Ilona Sorrel M.D.   On: 11/27/2015 10:40   Dg C-arm 1-60 Min-no Report  11/26/2015  CLINICAL DATA: procedure C-ARM 1-60 MINUTES Fluoroscopy was utilized by the requesting physician.  No radiographic interpretation.    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   PLAN:    1. Lung mass: PET results reviewed independently and reported as above. Given patient's stage of disease, she will benefit from concurrent chemotherapy with Taxol and carboplatinum along with daily XRT. A referral was given to radiation oncology. Patient will also require an MRI of her brain to complete the staging workup. She will also require port placement. Patient will return to clinic in 2-3 weeks to initiate cycle 1 of weekly carboplatinum and Taxol. Once her XRT is completed, she will likely receive 2 consolidation doses of chemotherapy.  2. Pain: Continue oxycodone as prescribed.  3. Hypertension: Patient's blood pressure is improved today. Continue current medications as prescribed. Monitor.  Approximately 30 minutes was spent in discussion of which greater than 50% was consultation.  Patient expressed understanding and was in agreement with this plan. She also understands that She can call clinic at any time with any questions, concerns, or  complaints.   [name], MD   12/07/2015 11:35 AM

## 2015-12-08 DIAGNOSIS — C3412 Malignant neoplasm of upper lobe, left bronchus or lung: Secondary | ICD-10-CM | POA: Diagnosis not present

## 2015-12-08 DIAGNOSIS — R7309 Other abnormal glucose: Secondary | ICD-10-CM | POA: Diagnosis not present

## 2015-12-08 DIAGNOSIS — E119 Type 2 diabetes mellitus without complications: Secondary | ICD-10-CM | POA: Diagnosis not present

## 2015-12-09 ENCOUNTER — Encounter
Admission: RE | Disposition: A | Discharge status: Home / Self Care | Payer: Self-pay | Source: Ambulatory Visit | Attending: Vascular Surgery

## 2015-12-09 ENCOUNTER — Encounter: Payer: Self-pay | Admitting: *Deleted

## 2015-12-09 ENCOUNTER — Ambulatory Visit
Admission: RE | Admit: 2015-12-09 | Discharge: 2015-12-09 | Disposition: A | Discharge status: Home / Self Care | Payer: PPO | Source: Ambulatory Visit | Attending: Vascular Surgery | Admitting: Vascular Surgery

## 2015-12-09 DIAGNOSIS — Z79899 Other long term (current) drug therapy: Secondary | ICD-10-CM | POA: Insufficient documentation

## 2015-12-09 DIAGNOSIS — Z794 Long term (current) use of insulin: Secondary | ICD-10-CM | POA: Diagnosis not present

## 2015-12-09 DIAGNOSIS — E119 Type 2 diabetes mellitus without complications: Secondary | ICD-10-CM | POA: Insufficient documentation

## 2015-12-09 DIAGNOSIS — C3492 Malignant neoplasm of unspecified part of left bronchus or lung: Secondary | ICD-10-CM | POA: Diagnosis not present

## 2015-12-09 DIAGNOSIS — I10 Essential (primary) hypertension: Secondary | ICD-10-CM | POA: Diagnosis not present

## 2015-12-09 DIAGNOSIS — E7800 Pure hypercholesterolemia, unspecified: Secondary | ICD-10-CM | POA: Diagnosis not present

## 2015-12-09 DIAGNOSIS — Z7984 Long term (current) use of oral hypoglycemic drugs: Secondary | ICD-10-CM | POA: Diagnosis not present

## 2015-12-09 DIAGNOSIS — M1990 Unspecified osteoarthritis, unspecified site: Secondary | ICD-10-CM | POA: Insufficient documentation

## 2015-12-09 DIAGNOSIS — C3402 Malignant neoplasm of left main bronchus: Secondary | ICD-10-CM | POA: Diagnosis not present

## 2015-12-09 DIAGNOSIS — F17210 Nicotine dependence, cigarettes, uncomplicated: Secondary | ICD-10-CM | POA: Insufficient documentation

## 2015-12-09 HISTORY — DX: Disease of blood and blood-forming organs, unspecified: D75.9

## 2015-12-09 HISTORY — PX: PERIPHERAL VASCULAR CATHETERIZATION: SHX172C

## 2015-12-09 SURGERY — PORTA CATH INSERTION
Anesthesia: Moderate Sedation

## 2015-12-09 MED ORDER — FENTANYL CITRATE (PF) 100 MCG/2ML IJ SOLN
INTRAMUSCULAR | Status: AC
  Filled 2015-12-09: qty 2

## 2015-12-09 MED ORDER — LIDOCAINE-EPINEPHRINE (PF) 1 %-1:200000 IJ SOLN
INTRAMUSCULAR | Status: DC | PRN
  Administered 2015-12-09: 20 mL via INTRADERMAL

## 2015-12-09 MED ORDER — HEPARIN (PORCINE) IN NACL 2-0.9 UNIT/ML-% IJ SOLN
INTRAMUSCULAR | Status: AC
  Filled 2015-12-09: qty 500

## 2015-12-09 MED ORDER — ATROPINE SULFATE 0.1 MG/ML IJ SOLN: 0.5000 mg | INTRAMUSCULAR | Status: DC | PRN

## 2015-12-09 MED ORDER — LIDOCAINE-EPINEPHRINE (PF) 1 %-1:200000 IJ SOLN
INTRAMUSCULAR | Status: AC
  Filled 2015-12-09: qty 30

## 2015-12-09 MED ORDER — SODIUM CHLORIDE 0.9 % IV SOLN
INTRAVENOUS | Status: DC
  Administered 2015-12-09: 08:00:00 via INTRAVENOUS

## 2015-12-09 MED ORDER — CEFUROXIME SODIUM 1.5 G IJ SOLR
INTRAMUSCULAR | Status: AC
  Filled 2015-12-09 (×17): qty 1.5

## 2015-12-09 MED ORDER — HYDROMORPHONE HCL 1 MG/ML IJ SOLN: 1.0000 mg | Freq: Once | INTRAMUSCULAR | Status: DC | PRN

## 2015-12-09 MED ORDER — MIDAZOLAM HCL 5 MG/5ML IJ SOLN
INTRAMUSCULAR | Status: AC
  Filled 2015-12-09: qty 5

## 2015-12-09 MED ORDER — DEXTROSE 5 % IV SOLN
1.5000 g | Freq: Once | INTRAVENOUS | Status: AC
  Administered 2015-12-09: 1.5 g via INTRAVENOUS

## 2015-12-09 MED ORDER — MIDAZOLAM HCL 2 MG/2ML IJ SOLN
INTRAMUSCULAR | Status: DC | PRN
  Administered 2015-12-09 (×3): 1 mg via INTRAVENOUS

## 2015-12-09 MED ORDER — LIDOCAINE HCL (PF) 1 % IJ SOLN
INTRAMUSCULAR | Status: AC
  Filled 2015-12-09: qty 30

## 2015-12-09 MED ORDER — ONDANSETRON HCL 4 MG/2ML IJ SOLN: 4.0000 mg | INTRAMUSCULAR | Status: DC | PRN

## 2015-12-09 MED ORDER — FENTANYL CITRATE (PF) 100 MCG/2ML IJ SOLN
INTRAMUSCULAR | Status: DC | PRN
  Administered 2015-12-09 (×3): 50 ug via INTRAVENOUS

## 2015-12-09 SURGICAL SUPPLY — 9 items
BAG DECANTER STRL (MISCELLANEOUS) ×3 IMPLANT
DRAPE INCISE IOBAN 66X45 STRL (DRAPES) ×3 IMPLANT
PACK ANGIOGRAPHY (CUSTOM PROCEDURE TRAY) ×3 IMPLANT
PORTACATH POWER 8F (Port) ×3 IMPLANT
PREP CHG 10.5 TEAL (MISCELLANEOUS) ×3 IMPLANT
SUT MNCRL AB 4-0 PS2 18 (SUTURE) ×3 IMPLANT
SUT PROLENE 0 CT 1 30 (SUTURE) ×3 IMPLANT
SUTURE VIC 3-0 (SUTURE) ×3 IMPLANT
TOWEL OR 17X26 4PK STRL BLUE (TOWEL DISPOSABLE) ×3 IMPLANT

## 2015-12-09 NOTE — Op Note (Signed)
OPERATIVE NOTE   PROCEDURE: 1. Placement of a right internal jugular Infuse-a-Port  PRE-OPERATIVE DIAGNOSIS: Lung carcinoma left side  POST-OPERATIVE DIAGNOSIS: Same  SURGEON: Katha Cabal M.D.  ANESTHESIA: Conscious sedation combined with 1% lidocaine with epinephrine  ESTIMATED BLOOD LOSS: Minimal   FINDING(S): 1.  Patent vein  SPECIMEN(S): None  INDICATIONS:   Susan Willis is a 65 y.o. female who presents with diagnosis of lung carcinoma located on the left side. She will require chemotherapy and therefore requires appropriate IV access.  DESCRIPTION: After obtaining full informed written consent, the patient was brought back to the special procedure suite and placed in the supine position. The patient's right neck and chest wall are prepped and draped in sterile fashion. Appropriate timeout was called.  Ultrasound is placed in a sterile sleeve, ultrasound is utilized to avoid vascular injury as well as secondary to lack of appropriate landmarks. The right internal jugular vein is identified. It is echolucent and homogeneous as well as easily compressible indicating patency. 1% lidocaine is infiltrated into the soft tissue at the base of the neck as well as on the chest wall.  Under direct ultrasound visualization Seldinger needle is inserted into the right internal jugular vein. J-wire is advanced under fluoroscopic guidance. A small counterincision was created at the wire insertion site. A transverse incision is created 2 fingerbreadths below the scapula and a pocket is fashioned using both blunt and sharp dissection. The pocket is tested for appropriate size with the hub of the Infuse-a-Port. The tunneling device is then used to pull the intravascular portion of the catheter from the pocket to the neck counterincision.  Dilator and peel-away sheath were then inserted over the wire and the wire is removed. Catheter is then advanced into the venous system without difficulty.  Peel-away sheath was then removed.  Catheter is then positioned under fluoroscopic guidance at the atrial caval junction. It is then transected connected to the hub and the hope is slipped into the subcutaneous pocket on the chest wall. The hub was then accessed percutaneously and aspirates easily and flushes well and is flushed with 30 cc of heparinized saline. The pocket incision is then closed in layers using interrupted 3-0 Vicryl for the subcutaneous tissues and 4-0 Monocryl subcuticular for skin closure. Dermabond is applied. The neck counterincision was closed with 4-0 Monocryl subcuticular and Dermabond as well.  The patient tolerated the procedure well and there were no immediate complications.  COMPLICATIONS: None  CONDITION: Unchanged  Katha Cabal M.D. [facility] Office: [phone]   12/09/2015, 8:58 AM

## 2015-12-09 NOTE — H&P (Signed)
[facility] History & Physical Update  The patient was interviewed and re-examined.  The patient's previous History and Physical has been reviewed and is unchanged.  There is no change in the plan of care. We plan to proceed with the scheduled procedure.  Laurieanne Galloway, Dolores Lory, MD  12/09/2015, 8:19 AM

## 2015-12-09 NOTE — Discharge Instructions (Signed)

## 2015-12-10 ENCOUNTER — Other Ambulatory Visit: Payer: Self-pay | Admitting: *Deleted

## 2015-12-10 ENCOUNTER — Inpatient Hospital Stay: Payer: PPO | Attending: Oncology

## 2015-12-10 DIAGNOSIS — Z5111 Encounter for antineoplastic chemotherapy: Secondary | ICD-10-CM | POA: Insufficient documentation

## 2015-12-10 DIAGNOSIS — M1990 Unspecified osteoarthritis, unspecified site: Secondary | ICD-10-CM | POA: Insufficient documentation

## 2015-12-10 DIAGNOSIS — Z79899 Other long term (current) drug therapy: Secondary | ICD-10-CM | POA: Insufficient documentation

## 2015-12-10 DIAGNOSIS — C3412 Malignant neoplasm of upper lobe, left bronchus or lung: Secondary | ICD-10-CM

## 2015-12-10 DIAGNOSIS — E7800 Pure hypercholesterolemia, unspecified: Secondary | ICD-10-CM | POA: Insufficient documentation

## 2015-12-10 DIAGNOSIS — I10 Essential (primary) hypertension: Secondary | ICD-10-CM | POA: Insufficient documentation

## 2015-12-10 DIAGNOSIS — E1165 Type 2 diabetes mellitus with hyperglycemia: Secondary | ICD-10-CM | POA: Insufficient documentation

## 2015-12-10 DIAGNOSIS — Z794 Long term (current) use of insulin: Secondary | ICD-10-CM | POA: Insufficient documentation

## 2015-12-10 DIAGNOSIS — Z90710 Acquired absence of both cervix and uterus: Secondary | ICD-10-CM | POA: Insufficient documentation

## 2015-12-10 DIAGNOSIS — R0602 Shortness of breath: Secondary | ICD-10-CM | POA: Insufficient documentation

## 2015-12-10 DIAGNOSIS — Z7984 Long term (current) use of oral hypoglycemic drugs: Secondary | ICD-10-CM | POA: Insufficient documentation

## 2015-12-10 DIAGNOSIS — F17210 Nicotine dependence, cigarettes, uncomplicated: Secondary | ICD-10-CM | POA: Insufficient documentation

## 2015-12-10 MED ORDER — LIDOCAINE-PRILOCAINE 2.5-2.5 % EX CREA: 1.0000 "application " | TOPICAL_CREAM | Freq: Once | CUTANEOUS | Status: DC

## 2015-12-11 ENCOUNTER — Telehealth: Payer: Self-pay | Admitting: *Deleted

## 2015-12-11 MED ORDER — OXYCODONE HCL 10 MG PO TABS: 10.0000 mg | ORAL_TABLET | Freq: Four times a day (QID) | ORAL | Status: DC | PRN

## 2015-12-11 NOTE — Telephone Encounter (Signed)
Informed that prescription is ready to pick up  

## 2015-12-14 ENCOUNTER — Ambulatory Visit: Payer: PPO | Admitting: Radiation Oncology

## 2015-12-14 ENCOUNTER — Telehealth: Payer: Self-pay | Admitting: *Deleted

## 2015-12-14 NOTE — Telephone Encounter (Signed)
Notified patient of schedule change due to weather, new appointment time on Thursday 12-17-15 @ 11am.  Patient verbalized understanding and repeated the date and time back.

## 2015-12-15 ENCOUNTER — Other Ambulatory Visit: Payer: Self-pay | Admitting: Oncology

## 2015-12-15 ENCOUNTER — Encounter: Payer: Self-pay | Admitting: Vascular Surgery

## 2015-12-15 DIAGNOSIS — C3412 Malignant neoplasm of upper lobe, left bronchus or lung: Secondary | ICD-10-CM

## 2015-12-15 MED ORDER — ONDANSETRON HCL 8 MG PO TABS: 8.0000 mg | ORAL_TABLET | Freq: Two times a day (BID) | ORAL | Status: DC

## 2015-12-15 MED ORDER — PROCHLORPERAZINE MALEATE 10 MG PO TABS: 10.0000 mg | ORAL_TABLET | Freq: Four times a day (QID) | ORAL | Status: DC | PRN

## 2015-12-15 MED ORDER — LIDOCAINE-PRILOCAINE 2.5-2.5 % EX CREA: TOPICAL_CREAM | CUTANEOUS | Status: DC

## 2015-12-16 ENCOUNTER — Ambulatory Visit (HOSPITAL_COMMUNITY): Admission: RE | Admit: 2015-12-16 | Payer: PPO | Source: Ambulatory Visit

## 2015-12-17 ENCOUNTER — Inpatient Hospital Stay: Payer: PPO | Admitting: Oncology

## 2015-12-17 ENCOUNTER — Ambulatory Visit
Admission: RE | Admit: 2015-12-17 | Discharge: 2015-12-17 | Disposition: A | Discharge status: Home / Self Care | Payer: PPO | Source: Ambulatory Visit | Attending: Radiation Oncology | Admitting: Radiation Oncology

## 2015-12-17 ENCOUNTER — Encounter: Payer: Self-pay | Admitting: Radiation Oncology

## 2015-12-17 VITALS — BP 114/71 | HR 90 | Temp 95.5°F | Resp 20 | Ht 64.0 in | Wt 236.8 lb

## 2015-12-17 DIAGNOSIS — Z794 Long term (current) use of insulin: Secondary | ICD-10-CM | POA: Insufficient documentation

## 2015-12-17 DIAGNOSIS — C3412 Malignant neoplasm of upper lobe, left bronchus or lung: Secondary | ICD-10-CM | POA: Diagnosis not present

## 2015-12-17 DIAGNOSIS — F17210 Nicotine dependence, cigarettes, uncomplicated: Secondary | ICD-10-CM | POA: Insufficient documentation

## 2015-12-17 DIAGNOSIS — M129 Arthropathy, unspecified: Secondary | ICD-10-CM | POA: Insufficient documentation

## 2015-12-17 DIAGNOSIS — D759 Disease of blood and blood-forming organs, unspecified: Secondary | ICD-10-CM | POA: Insufficient documentation

## 2015-12-17 DIAGNOSIS — E7800 Pure hypercholesterolemia, unspecified: Secondary | ICD-10-CM | POA: Insufficient documentation

## 2015-12-17 DIAGNOSIS — R630 Anorexia: Secondary | ICD-10-CM | POA: Insufficient documentation

## 2015-12-17 DIAGNOSIS — R599 Enlarged lymph nodes, unspecified: Secondary | ICD-10-CM | POA: Insufficient documentation

## 2015-12-17 DIAGNOSIS — Z510 Encounter for antineoplastic radiation therapy: Secondary | ICD-10-CM | POA: Insufficient documentation

## 2015-12-17 DIAGNOSIS — Z7984 Long term (current) use of oral hypoglycemic drugs: Secondary | ICD-10-CM | POA: Insufficient documentation

## 2015-12-17 DIAGNOSIS — E119 Type 2 diabetes mellitus without complications: Secondary | ICD-10-CM | POA: Insufficient documentation

## 2015-12-17 NOTE — Consult Note (Signed)
Except an outstanding is perfect of Radiation Oncology NEW PATIENT EVALUATION  Name: Susan Willis  MRN: [id number]  Date:   12/17/2015     DOB: 02-24-1951   This 65 y.o. female patient presents to the clinic for initial evaluation of stage IIIa (. T4 N2 M0) non-small cell lung cancer for concurrent chemoradiation  REFERRING PHYSICIAN: Marguerita Merles, MD  CHIEF COMPLAINT:  Chief Complaint  Patient presents with  . Lung Cancer    Pt is here for initial consultation of lung cancer.     DIAGNOSIS: The encounter diagnosis was Malignant neoplasm of upper lobe of left lung ([place]).   PREVIOUS INVESTIGATIONS:  PET CT scan and CT scans reviewed Critical notes reviewed Cytology and pathology reports reviewed  HPI: Patient is a 65 year old female presented with significant chest pain was found on chest x-ray to have an enlarged left lung mass suspicious for malignancy. CT of this chest done for pulmonary embolus showed interval interval development of a 3 cm left upper lobe pulmonary nodule with left hilar adenopathy. There is also right hilar adenopathy concerning for malignancy. PET CT scan again showed hypermetabolic activity in the 3 cm left upper lobe pulmonary nodule. She also had bulky hypermetabolic ipsilateral peribronchial ipsilateral hilar lymphadenopathy. No metastasis measured proximal me 6 cm and could not rule out invasion of the mediastinum. Cytology was positive for poorly differentiated carcinoma. She has been having some anorexia lost some weight not definitive exact amount. She specifically denies cough or hemoptysis. She does not specifically complain of dyspnea on exertion. She is seen today for consideration of radiation therapy with concurrent chemotherapy.  PLANNED TREATMENT REGIMEN: Concurrent chemoradiation  PAST MEDICAL HISTORY:  has a past medical history of Arthritis; Diabetes mellitus without complication ([place]); Hypertension; High cholesterol; and Blood dyscrasia.     PAST SURGICAL HISTORY:  Past Surgical History  Procedure Laterality Date  . Abdominal hysterectomy    . Cesarean section    . Endobronchial ultrasound N/A 11/26/2015    Procedure: ENDOBRONCHIAL ULTRASOUND;  Surgeon: Flora Lipps, MD;  Location: [facility];  Service: Cardiopulmonary;  Laterality: N/A;  . Electromagnetic navigation brochoscopy N/A 11/26/2015    Procedure: ELECTROMAGNETIC NAVIGATION BRONCHOSCOPY;  Surgeon: Flora Lipps, MD;  Location: [facility];  Service: Cardiopulmonary;  Laterality: N/A;  . Peripheral vascular catheterization N/A 12/09/2015    Procedure: Glori Luis Cath Insertion;  Surgeon: Katha Cabal, MD;  Location: [facility];  Service: Cardiovascular;  Laterality: N/A;    FAMILY HISTORY: family history is not on file.  SOCIAL HISTORY:  reports that she has been smoking Cigarettes.  She has a 70 pack-year smoking history. She has never used smokeless tobacco. She reports that she does not drink alcohol or use illicit drugs.  ALLERGIES: Review of patient's allergies indicates no known allergies.  MEDICATIONS:  Current Outpatient Prescriptions  Medication Sig Dispense Refill  . atorvastatin (LIPITOR) 20 MG tablet Take 20 mg by mouth daily at 6 PM.    . doxycycline (VIBRAMYCIN) 100 MG capsule Take 1 capsule (100 mg) by mouth twice daily for 10 days. 20 capsule 0  . fentaNYL (DURAGESIC - DOSED MCG/HR) 25 MCG/HR patch Place 1 patch (25 mcg total) onto the skin every 3 (three) days. 10 patch 0  . insulin glargine (LANTUS) 100 UNIT/ML injection Inject 60 Units into the skin at bedtime.    . lidocaine-prilocaine (EMLA) cream Apply 1 application topically once. Apply to port 1-2 hours prior to chemotherapy. Cover with plastic wrap. 30 g  0  . lidocaine-prilocaine (EMLA) cream Apply to affected area once 30 g 3  . linagliptin (TRADJENTA) 5 MG TABS tablet Take 5 mg by mouth daily.    Marland Kitchen lisinopril (PRINIVIL,ZESTRIL) 10 MG tablet Take 10 mg by mouth daily.    . metFORMIN  (GLUMETZA) 500 MG (MOD) 24 hr tablet Take 1,000 mg by mouth 2 (two) times daily with a meal.    . ondansetron (ZOFRAN) 8 MG tablet Take 1 tablet (8 mg total) by mouth 2 (two) times daily. Start the day after chemo for 3 days. Then take as needed for nausea or vomiting. 30 tablet 1  . Oxycodone HCl 10 MG TABS Take 1 tablet (10 mg total) by mouth every 6 (six) hours as needed. 60 tablet 0  . oxyCODONE-acetaminophen (ROXICET) 5-325 MG tablet Take 1 tablet by mouth every 4 (four) hours as needed for severe pain. 6 tablet 0  . prochlorperazine (COMPAZINE) 10 MG tablet Take 1 tablet (10 mg total) by mouth every 6 (six) hours as needed (Nausea or vomiting). 30 tablet 1   No current facility-administered medications for this encounter.    ECOG PERFORMANCE STATUS:  0 - Asymptomatic  REVIEW OF SYSTEMS:  Patient denies any weight loss, fatigue, weakness, fever, chills or night sweats. Patient denies any loss of vision, blurred vision. Patient denies any ringing  of the ears or hearing loss. No irregular heartbeat. Patient denies heart murmur or history of fainting. Patient denies any chest pain or pain radiating to her upper extremities. Patient denies any shortness of breath, difficulty breathing at night, cough or hemoptysis. Patient denies any swelling in the lower legs. Patient denies any nausea vomiting, vomiting of blood, or coffee ground material in the vomitus. Patient denies any stomach pain. Patient states has had normal bowel movements no significant constipation or diarrhea. Patient denies any dysuria, hematuria or significant nocturia. Patient denies any problems walking, swelling in the joints or loss of balance. Patient denies any skin changes, loss of hair or loss of weight. Patient denies any excessive worrying or anxiety or significant depression. Patient denies any problems with insomnia. Patient denies excessive thirst, polyuria, polydipsia. Patient denies any swollen glands, patient denies easy  bruising or easy bleeding. Patient denies any recent infections, allergies or URI. Patient "s visual fields have not changed significantly in recent time.    PHYSICAL EXAM: BP 114/71 mmHg  Pulse 90  Temp(Src) 95.5 F (35.3 C)  Resp 20  Ht '5\' 4"'$  (1.626 m)  Wt 236 lb 12.4 oz (107.4 kg)  BMI 40.62 kg/m2 Well-developed slightly obese female in NAD. No evidence of cervical or supraclavicular adenopathy is identified. Well-developed well-nourished patient in NAD. HEENT reveals PERLA, EOMI, discs not visualized.  Oral cavity is clear. No oral mucosal lesions are identified. Neck is clear without evidence of cervical or supraclavicular adenopathy. Lungs are clear to A&P. Cardiac examination is essentially unremarkable with regular rate and rhythm without murmur rub or thrill. Abdomen is benign with no organomegaly or masses noted. Motor sensory and DTR levels are equal and symmetric in the upper and lower extremities. Cranial nerves II through XII are grossly intact. Proprioception is intact. No peripheral adenopathy or edema is identified. No motor or sensory levels are noted. Crude visual fields are within normal range.  LABORATORY DATA: Cytology and pathology reports reviewed    RADIOLOGY RESULTS: CT scan PET CT scan reviewed MRI of brain has been ordered   IMPRESSION: Stage IIIa poorly differentiated non-small cell lung cancer of  the left upper lobe in 65 year old female  PLAN: At this time I have recommended concurrent radiation therapy with chemotherapy. I like to treat her with split course fashion based on the large extensive nature of her disease treat up to 4000 cGy over 4 weeks we'll reevaluate for response and then possibly boost another 2000 cGy after a 1 week break. Risks and benefits of treatment including increased cough fatigue possible dysphasia secondary radiation esophagitis, alteration of blood counts and loss of normal lung volume all were explained in detail to the patient and  her granddaughter. I have set up and ordered CT simulation early next week. We'll coordinate her chemotherapy with medical oncology. Will review her MRI of her brain to rule out possibility of stage IV disease when available. PET scan does not show any evidence of disease outside the chest.  I would like to take this opportunity for allowing me to participate in the care of your patient.Armstead Peaks., MD

## 2015-12-21 ENCOUNTER — Ambulatory Visit
Admission: RE | Admit: 2015-12-21 | Discharge: 2015-12-21 | Disposition: A | Discharge status: Home / Self Care | Payer: PPO | Source: Ambulatory Visit | Attending: Radiation Oncology | Admitting: Radiation Oncology

## 2015-12-21 DIAGNOSIS — M129 Arthropathy, unspecified: Secondary | ICD-10-CM | POA: Diagnosis not present

## 2015-12-21 DIAGNOSIS — E119 Type 2 diabetes mellitus without complications: Secondary | ICD-10-CM | POA: Diagnosis not present

## 2015-12-21 DIAGNOSIS — R630 Anorexia: Secondary | ICD-10-CM | POA: Diagnosis not present

## 2015-12-21 DIAGNOSIS — Z510 Encounter for antineoplastic radiation therapy: Secondary | ICD-10-CM | POA: Diagnosis not present

## 2015-12-21 DIAGNOSIS — Z7984 Long term (current) use of oral hypoglycemic drugs: Secondary | ICD-10-CM | POA: Diagnosis not present

## 2015-12-21 DIAGNOSIS — R599 Enlarged lymph nodes, unspecified: Secondary | ICD-10-CM | POA: Diagnosis not present

## 2015-12-21 DIAGNOSIS — Z794 Long term (current) use of insulin: Secondary | ICD-10-CM | POA: Diagnosis not present

## 2015-12-21 DIAGNOSIS — C3412 Malignant neoplasm of upper lobe, left bronchus or lung: Secondary | ICD-10-CM | POA: Diagnosis not present

## 2015-12-21 DIAGNOSIS — F17210 Nicotine dependence, cigarettes, uncomplicated: Secondary | ICD-10-CM | POA: Diagnosis not present

## 2015-12-21 DIAGNOSIS — E7800 Pure hypercholesterolemia, unspecified: Secondary | ICD-10-CM | POA: Diagnosis not present

## 2015-12-21 DIAGNOSIS — D759 Disease of blood and blood-forming organs, unspecified: Secondary | ICD-10-CM | POA: Diagnosis not present

## 2015-12-23 ENCOUNTER — Inpatient Hospital Stay (HOSPITAL_BASED_OUTPATIENT_CLINIC_OR_DEPARTMENT_OTHER): Payer: PPO | Admitting: Oncology

## 2015-12-23 ENCOUNTER — Inpatient Hospital Stay: Payer: PPO

## 2015-12-23 VITALS — BP 130/79 | HR 77 | Resp 18

## 2015-12-23 VITALS — BP 118/79 | HR 85 | Temp 97.4°F | Resp 18 | Wt 236.6 lb

## 2015-12-23 DIAGNOSIS — M1990 Unspecified osteoarthritis, unspecified site: Secondary | ICD-10-CM | POA: Diagnosis not present

## 2015-12-23 DIAGNOSIS — I10 Essential (primary) hypertension: Secondary | ICD-10-CM | POA: Diagnosis not present

## 2015-12-23 DIAGNOSIS — C3412 Malignant neoplasm of upper lobe, left bronchus or lung: Secondary | ICD-10-CM | POA: Diagnosis not present

## 2015-12-23 DIAGNOSIS — Z79899 Other long term (current) drug therapy: Secondary | ICD-10-CM | POA: Diagnosis not present

## 2015-12-23 DIAGNOSIS — E1165 Type 2 diabetes mellitus with hyperglycemia: Secondary | ICD-10-CM | POA: Diagnosis not present

## 2015-12-23 DIAGNOSIS — R0602 Shortness of breath: Secondary | ICD-10-CM | POA: Diagnosis not present

## 2015-12-23 DIAGNOSIS — C3402 Malignant neoplasm of left main bronchus: Secondary | ICD-10-CM

## 2015-12-23 DIAGNOSIS — F17210 Nicotine dependence, cigarettes, uncomplicated: Secondary | ICD-10-CM | POA: Diagnosis not present

## 2015-12-23 DIAGNOSIS — E7800 Pure hypercholesterolemia, unspecified: Secondary | ICD-10-CM | POA: Diagnosis not present

## 2015-12-23 DIAGNOSIS — Z794 Long term (current) use of insulin: Secondary | ICD-10-CM | POA: Diagnosis not present

## 2015-12-23 DIAGNOSIS — Z7984 Long term (current) use of oral hypoglycemic drugs: Secondary | ICD-10-CM | POA: Diagnosis not present

## 2015-12-23 DIAGNOSIS — Z5111 Encounter for antineoplastic chemotherapy: Secondary | ICD-10-CM | POA: Diagnosis not present

## 2015-12-23 DIAGNOSIS — Z90710 Acquired absence of both cervix and uterus: Secondary | ICD-10-CM | POA: Diagnosis not present

## 2015-12-23 LAB — CBC WITH DIFFERENTIAL/PLATELET
Basophils Absolute: 0 10*3/uL (ref 0–0.1)
Basophils Relative: 1 %
Eosinophils Absolute: 0.1 10*3/uL (ref 0–0.7)
Eosinophils Relative: 1 %
HEMATOCRIT: 34.8 % — AB (ref 35.0–47.0)
HEMOGLOBIN: 11.4 g/dL — AB (ref 12.0–16.0)
LYMPHS ABS: 1.9 10*3/uL (ref 1.0–3.6)
LYMPHS PCT: 30 %
MCH: 27.4 pg (ref 26.0–34.0)
MCHC: 32.9 g/dL (ref 32.0–36.0)
MCV: 83.2 fL (ref 80.0–100.0)
MONOS PCT: 7 %
Monocytes Absolute: 0.4 10*3/uL (ref 0.2–0.9)
NEUTROS ABS: 3.8 10*3/uL (ref 1.4–6.5)
NEUTROS PCT: 61 %
Platelets: 169 10*3/uL (ref 150–440)
RBC: 4.18 MIL/uL (ref 3.80–5.20)
RDW: 14 % (ref 11.5–14.5)
WBC: 6.2 10*3/uL (ref 3.6–11.0)

## 2015-12-23 LAB — COMPREHENSIVE METABOLIC PANEL
ALK PHOS: 119 U/L (ref 38–126)
ALT: 18 U/L (ref 14–54)
ANION GAP: 3 — AB (ref 5–15)
AST: 16 U/L (ref 15–41)
Albumin: 3.6 g/dL (ref 3.5–5.0)
BILIRUBIN TOTAL: 0.4 mg/dL (ref 0.3–1.2)
BUN: 15 mg/dL (ref 6–20)
CALCIUM: 9.5 mg/dL (ref 8.9–10.3)
CO2: 26 mmol/L (ref 22–32)
CREATININE: 0.7 mg/dL (ref 0.44–1.00)
Chloride: 105 mmol/L (ref 101–111)
GFR calc non Af Amer: >60 mL/min (ref >60)
GLUCOSE: 245 mg/dL — AB (ref 65–99)
Potassium: 3.8 mmol/L (ref 3.5–5.1)
SODIUM: 134 mmol/L — AB (ref 135–145)
TOTAL PROTEIN: 7.4 g/dL (ref 6.5–8.1)

## 2015-12-23 MED ORDER — SODIUM CHLORIDE 0.9 % IJ SOLN
10.0000 mL | INTRAMUSCULAR | Status: DC | PRN
  Filled 2015-12-23: qty 10

## 2015-12-23 MED ORDER — SODIUM CHLORIDE 0.9 % IJ SOLN
10.0000 mL | Freq: Once | INTRAMUSCULAR | Status: AC
  Administered 2015-12-23: 10 mL via INTRAVENOUS
  Filled 2015-12-23: qty 10

## 2015-12-23 MED ORDER — DEXTROSE 5 % IV SOLN
45.0000 mg/m2 | Freq: Once | INTRAVENOUS | Status: AC
  Administered 2015-12-23: 102 mg via INTRAVENOUS
  Filled 2015-12-23: qty 17

## 2015-12-23 MED ORDER — DIPHENHYDRAMINE HCL 50 MG/ML IJ SOLN
25.0000 mg | Freq: Once | INTRAMUSCULAR | Status: AC
  Administered 2015-12-23: 25 mg via INTRAVENOUS
  Filled 2015-12-23: qty 1

## 2015-12-23 MED ORDER — HEPARIN SOD (PORK) LOCK FLUSH 100 UNIT/ML IV SOLN: 500.0000 [IU] | Freq: Once | INTRAVENOUS | Status: DC | PRN

## 2015-12-23 MED ORDER — SODIUM CHLORIDE 0.9 % IV SOLN
Freq: Once | INTRAVENOUS | Status: AC
  Administered 2015-12-23: 11:00:00 via INTRAVENOUS
  Filled 2015-12-23: qty 1000

## 2015-12-23 MED ORDER — HEPARIN SOD (PORK) LOCK FLUSH 100 UNIT/ML IV SOLN
500.0000 [IU] | Freq: Once | INTRAVENOUS | Status: AC
  Administered 2015-12-23: 500 [IU] via INTRAVENOUS
  Filled 2015-12-23: qty 5

## 2015-12-23 MED ORDER — FAMOTIDINE IN NACL 20-0.9 MG/50ML-% IV SOLN
20.0000 mg | Freq: Once | INTRAVENOUS | Status: AC
  Administered 2015-12-23: 20 mg via INTRAVENOUS
  Filled 2015-12-23: qty 50

## 2015-12-23 MED ORDER — SODIUM CHLORIDE 0.9 % IV SOLN
Freq: Once | INTRAVENOUS | Status: AC
  Administered 2015-12-23: 11:00:00 via INTRAVENOUS
  Filled 2015-12-23: qty 8

## 2015-12-23 MED ORDER — CARBOPLATIN CHEMO INJECTION 450 MG/45ML
240.6000 mg | Freq: Once | INTRAVENOUS | Status: AC
  Administered 2015-12-23: 240 mg via INTRAVENOUS
  Filled 2015-12-23: qty 24

## 2015-12-25 DIAGNOSIS — C3412 Malignant neoplasm of upper lobe, left bronchus or lung: Secondary | ICD-10-CM | POA: Diagnosis not present

## 2015-12-25 DIAGNOSIS — Z510 Encounter for antineoplastic radiation therapy: Secondary | ICD-10-CM | POA: Diagnosis not present

## 2015-12-25 NOTE — Progress Notes (Signed)
Susan Willis  [phone] [phone]  ID: [name] OB: February 22, 1951  MR#: [id number]  ONG#:[id number]  Patient Care Team: Marguerita Merles, MD as PCP - General (Family Medicine)  CHIEF COMPLAINT: Lung mass.  INTERVAL HISTORY: Patient returns to clinic today for further evaluation and initiate cycle 1 of weekly carboplatinum and Taxol along with daily XRT. She currently feels well and is asymptomatic. She does not complain of any further chest pain. She has no neurologic complaints. She denies any recent fevers or illnesses. She has a fair appetite and denies weight loss. She denies any shortness of breath, cough, or hemoptysis. She has no nausea, vomiting, constipation, or diarrhea. She has no urinary complaints. Patient offers no specific complaints today.   REVIEW OF SYSTEMS:   Review of Systems  Constitutional: Negative for fever, weight loss and malaise/fatigue.  Respiratory: Negative for cough, hemoptysis and shortness of breath.   Cardiovascular: Negative for chest pain and leg swelling.  Gastrointestinal: Negative.   Musculoskeletal: Negative.   Neurological: Negative.  Negative for weakness.    As per HPI. Otherwise, a complete review of systems is negatve.  PAST MEDICAL HISTORY: Past Medical History  Diagnosis Date  . Arthritis   . Diabetes mellitus without complication ([place])   . Hypertension   . High cholesterol   . Blood dyscrasia     PAST SURGICAL HISTORY: Past Surgical History  Procedure Laterality Date  . Abdominal hysterectomy    . Cesarean section    . Endobronchial ultrasound N/A 11/26/2015    Procedure: ENDOBRONCHIAL ULTRASOUND;  Surgeon: Flora Lipps, MD;  Location: [facility];  Service: Cardiopulmonary;  Laterality: N/A;  . Electromagnetic navigation brochoscopy N/A 11/26/2015    Procedure: ELECTROMAGNETIC NAVIGATION BRONCHOSCOPY;  Surgeon: Flora Lipps, MD;  Location: [facility];  Service: Cardiopulmonary;  Laterality:  N/A;  . Peripheral vascular catheterization N/A 12/09/2015    Procedure: Glori Luis Cath Insertion;  Surgeon: Katha Cabal, MD;  Location: [facility];  Service: Cardiovascular;  Laterality: N/A;    FAMILY HISTORY: Reviewed and unchanged. No reported history of malignancy or chronic disease.     ADVANCED DIRECTIVES:    HEALTH MAINTENANCE: Social History  Substance Use Topics  . Smoking status: Current Every Day Smoker -- 2.00 packs/day for 35 years    Types: Cigarettes  . Smokeless tobacco: Never Used  . Alcohol Use: No     Colonoscopy:  PAP:  Bone density:  Lipid panel:  No Known Allergies  Current Outpatient Prescriptions  Medication Sig Dispense Refill  . atorvastatin (LIPITOR) 20 MG tablet Take 20 mg by mouth daily at 6 PM.    . insulin glargine (LANTUS) 100 UNIT/ML injection Inject 60 Units into the skin at bedtime.    . lidocaine-prilocaine (EMLA) cream Apply 1 application topically once. Apply to port 1-2 hours prior to chemotherapy. Cover with plastic wrap. 30 g 0  . lidocaine-prilocaine (EMLA) cream Apply to affected area once 30 g 3  . linagliptin (TRADJENTA) 5 MG TABS tablet Take 5 mg by mouth daily.    Marland Kitchen lisinopril (PRINIVIL,ZESTRIL) 10 MG tablet Take 10 mg by mouth daily.    . Oxycodone HCl 10 MG TABS Take 1 tablet (10 mg total) by mouth every 6 (six) hours as needed. 60 tablet 0  . doxycycline (VIBRAMYCIN) 100 MG capsule Take 1 capsule (100 mg) by mouth twice daily for 10 days. (Patient not taking: Reported on 12/23/2015) 20 capsule 0  . fentaNYL (DURAGESIC - DOSED MCG/HR)  25 MCG/HR patch Place 1 patch (25 mcg total) onto the skin every 3 (three) days. (Patient not taking: Reported on 12/23/2015) 10 patch 0  . metFORMIN (GLUMETZA) 500 MG (MOD) 24 hr tablet Take 1,000 mg by mouth 2 (two) times daily with a meal. Reported on 12/23/2015    . ondansetron (ZOFRAN) 8 MG tablet Take 1 tablet (8 mg total) by mouth 2 (two) times daily. Start the day after chemo for 3  days. Then take as needed for nausea or vomiting. (Patient not taking: Reported on 12/23/2015) 30 tablet 1  . oxyCODONE-acetaminophen (ROXICET) 5-325 MG tablet Take 1 tablet by mouth every 4 (four) hours as needed for severe pain. (Patient not taking: Reported on 12/23/2015) 6 tablet 0  . prochlorperazine (COMPAZINE) 10 MG tablet Take 1 tablet (10 mg total) by mouth every 6 (six) hours as needed (Nausea or vomiting). (Patient not taking: Reported on 12/23/2015) 30 tablet 1   No current facility-administered medications for this visit.    OBJECTIVE: Filed Vitals:   12/23/15 0935  BP: 118/79  Pulse: 85  Temp: 97.4 F (36.3 C)  Resp: 18     Body mass index is 40.58 kg/(m^2).    ECOG FS:0 - Asymptomatic  General: Well-developed, well-nourished, no acute distress. Eyes: Pink conjunctiva, anicteric sclera. Lungs: Clear to auscultation bilaterally. Heart: Regular rate and rhythm. No rubs, murmurs, or gallops. Abdomen: Soft, nontender, nondistended. No organomegaly noted, normoactive bowel sounds. Musculoskeletal: No edema, cyanosis, or clubbing. Neuro: Alert, answering all questions appropriately. Cranial nerves grossly intact. Skin: No rashes or petechiae noted. Psych: Normal affect.   LAB RESULTS:  Lab Results  Component Value Date   NA 134* 12/23/2015   K 3.8 12/23/2015   CL 105 12/23/2015   CO2 26 12/23/2015   GLUCOSE 245* 12/23/2015   BUN 15 12/23/2015   CREATININE 0.70 12/23/2015   CALCIUM 9.5 12/23/2015   PROT 7.4 12/23/2015   ALBUMIN 3.6 12/23/2015   AST 16 12/23/2015   ALT 18 12/23/2015   ALKPHOS 119 12/23/2015   BILITOT 0.4 12/23/2015   GFRNONAA >60 12/23/2015   GFRAA >60 12/23/2015    Lab Results  Component Value Date   WBC 6.2 12/23/2015   NEUTROABS 3.8 12/23/2015   HGB 11.4* 12/23/2015   HCT 34.8* 12/23/2015   MCV 83.2 12/23/2015   PLT 169 12/23/2015     STUDIES: Nm Pet Image Initial (pi) Skull Base To Thigh  11/27/2015  CLINICAL DATA:  Initial  treatment strategy for left upper lobe lung nodule with bilateral hilar and mediastinal lymphadenopathy detected on recent chest CT angiogram. The patient underwent bronchoscopic biopsy 1 day prior, with pathology results pending. EXAM: NUCLEAR MEDICINE PET SKULL BASE TO THIGH TECHNIQUE: 12.1 mCi F-18 FDG was injected intravenously. Full-ring PET imaging was performed from the skull base to thigh after the radiotracer. CT data was obtained and used for attenuation correction and anatomic localization. FASTING BLOOD GLUCOSE:  Value: 194 mg/dl COMPARISON:  11/16/2015 chest CT angiogram. FINDINGS: NECK No hypermetabolic lymph nodes in the neck. There is an osteoma in the right frontal sinus. CHEST There is a hypermetabolic 2.8 x 2.3 cm left upper lobe pulmonary nodule (series 3/image 60) with max SUV 14.1. There is bulky confluent ipsilateral peribronchial and ipsilateral hilar lymphadenopathy, for example a large hypermetabolic 6.6 x 3.8 cm ipsilateral peribronchial node in the central medial left upper lobe (series 3/image 73) with max SUV 11.2, which demonstrates a long base of attachment to the left mediastinal pleural surface,  cannot exclude superficial left mediastinal invasion. There is a separate hypermetabolic 3.3 cm ipsilateral peribronchial node in the left upper lobe (series 3/image 74) with max SUV 16.9. There is a 2.4 cm hypermetabolic left hilar node (series 3/ image 83) with max SUV 12.1. There is no hypermetabolic mediastinal or contralateral hilar lymphadenopathy. Re- demonstrated is extensive ground-glass centrilobular nodularity throughout both lungs, upper lung predominant, with cavitary change in several of the small nodules (better visualized on the 11/16/2015 chest CT angiogram study), not appreciably changed and without associated hypermetabolism. No acute consolidative airspace disease or new hypermetabolic pulmonary nodules. No hypermetabolic axillary nodes. Heart appears borderline enlarged  and slightly increased in size in the interval. Left anterior descending and right coronary atherosclerosis. ABDOMEN/PELVIS No abnormal hypermetabolic activity within the liver, pancreas, adrenal glands, or spleen. No hypermetabolic lymph nodes in the abdomen or pelvis. Scattered mild colonic diverticulosis. Status post hysterectomy. SKELETON No focal hypermetabolic activity to suggest skeletal metastasis. IMPRESSION: 1. Hypermetabolic 2.8 x 2.3 cm left upper lobe pulmonary nodule, in keeping with a primary bronchogenic carcinoma. 2. Bulky hypermetabolic ipsilateral peribronchial and ipsilateral hilar lymphadenopathy. Cannot exclude superficial invasion of the left mediastinum by the dominant 6.6 cm ipsilateral peribronchial nodal metastasis. 3. No hypermetabolic mediastinal or contralateral hilar lymphadenopathy. No hypermetabolic extrathoracic metastatic disease. 4. Interval stability of extensive upper lung predominant ground-glass centrilobular nodularity with variable cavitary change within the nodules, better visualized on the recent chest CT angiogram study, most suggestive of Langerhans cell histiocytosis as suggested on the 11/16/2015 chest CT report. 5. Borderline cardiomegaly, increased in size. Coronary atherosclerosis. Electronically Signed   By: Ilona Sorrel M.D.   On: 11/27/2015 10:40   Dg C-arm 1-60 Min-no Report  11/26/2015  CLINICAL DATA: procedure C-ARM 1-60 MINUTES Fluoroscopy was utilized by the requesting physician.  No radiographic interpretation.    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   PLAN:    1. Lung mass: PET results reviewed independently and reported as above. Given patient's stage of disease, she will benefit from concurrent chemotherapy with Taxol and carboplatinum along with daily XRT. Patient will also require an MRI of her brain to complete the staging workup.  Proceed with cycle 1 of weekly carboplatinum and Taxol today. Patient will initiate her  XRT on December 28, 2015. Return to clinic in 1 week for consideration of cycle 2. Once her XRT is completed, she will likely receive 2 consolidation doses of chemotherapy.  2. Pain: Continue oxycodone as prescribed.  3. Hypertension: Patient's blood pressure is improved today. Continue current medications as prescribed. Monitor.   Patient expressed understanding and was in agreement with this plan. She also understands that She can call clinic at any time with any questions, concerns, or complaints.   [name], MD   12/25/2015 2:42 PM

## 2015-12-28 ENCOUNTER — Ambulatory Visit
Admission: RE | Admit: 2015-12-28 | Discharge: 2015-12-28 | Disposition: A | Discharge status: Home / Self Care | Payer: PPO | Source: Ambulatory Visit | Attending: Radiation Oncology | Admitting: Radiation Oncology

## 2015-12-28 DIAGNOSIS — C3412 Malignant neoplasm of upper lobe, left bronchus or lung: Secondary | ICD-10-CM | POA: Diagnosis not present

## 2015-12-28 DIAGNOSIS — Z510 Encounter for antineoplastic radiation therapy: Secondary | ICD-10-CM | POA: Diagnosis not present

## 2015-12-29 ENCOUNTER — Ambulatory Visit
Admission: RE | Admit: 2015-12-29 | Discharge: 2015-12-29 | Disposition: A | Discharge status: Home / Self Care | Payer: PPO | Source: Ambulatory Visit | Attending: Radiation Oncology | Admitting: Radiation Oncology

## 2015-12-29 DIAGNOSIS — Z510 Encounter for antineoplastic radiation therapy: Secondary | ICD-10-CM | POA: Diagnosis not present

## 2015-12-29 DIAGNOSIS — C3412 Malignant neoplasm of upper lobe, left bronchus or lung: Secondary | ICD-10-CM | POA: Diagnosis not present

## 2015-12-30 ENCOUNTER — Inpatient Hospital Stay (HOSPITAL_BASED_OUTPATIENT_CLINIC_OR_DEPARTMENT_OTHER): Payer: PPO | Admitting: Oncology

## 2015-12-30 ENCOUNTER — Inpatient Hospital Stay: Payer: PPO

## 2015-12-30 ENCOUNTER — Ambulatory Visit
Admission: RE | Admit: 2015-12-30 | Discharge: 2015-12-30 | Disposition: A | Discharge status: Home / Self Care | Payer: PPO | Source: Ambulatory Visit | Attending: Radiation Oncology | Admitting: Radiation Oncology

## 2015-12-30 VITALS — BP 132/82 | HR 83 | Temp 97.3°F | Resp 18 | Wt 238.1 lb

## 2015-12-30 DIAGNOSIS — Z5111 Encounter for antineoplastic chemotherapy: Secondary | ICD-10-CM | POA: Diagnosis not present

## 2015-12-30 DIAGNOSIS — R0602 Shortness of breath: Secondary | ICD-10-CM | POA: Diagnosis not present

## 2015-12-30 DIAGNOSIS — C3412 Malignant neoplasm of upper lobe, left bronchus or lung: Secondary | ICD-10-CM

## 2015-12-30 DIAGNOSIS — F17210 Nicotine dependence, cigarettes, uncomplicated: Secondary | ICD-10-CM

## 2015-12-30 DIAGNOSIS — Z79899 Other long term (current) drug therapy: Secondary | ICD-10-CM

## 2015-12-30 DIAGNOSIS — Z510 Encounter for antineoplastic radiation therapy: Secondary | ICD-10-CM | POA: Diagnosis not present

## 2015-12-30 DIAGNOSIS — E1165 Type 2 diabetes mellitus with hyperglycemia: Secondary | ICD-10-CM

## 2015-12-30 LAB — COMPREHENSIVE METABOLIC PANEL
ALK PHOS: 90 U/L (ref 38–126)
ALT: 14 U/L (ref 14–54)
ANION GAP: 5 (ref 5–15)
AST: 12 U/L — ABNORMAL LOW (ref 15–41)
Albumin: 3.6 g/dL (ref 3.5–5.0)
BILIRUBIN TOTAL: 0.4 mg/dL (ref 0.3–1.2)
BUN: 12 mg/dL (ref 6–20)
CALCIUM: 9.6 mg/dL (ref 8.9–10.3)
CO2: 28 mmol/L (ref 22–32)
Chloride: 102 mmol/L (ref 101–111)
Creatinine, Ser: 0.62 mg/dL (ref 0.44–1.00)
GFR calc non Af Amer: >60 mL/min (ref >60)
Glucose, Bld: 229 mg/dL — ABNORMAL HIGH (ref 65–99)
POTASSIUM: 4.2 mmol/L (ref 3.5–5.1)
SODIUM: 135 mmol/L (ref 135–145)
TOTAL PROTEIN: 7.2 g/dL (ref 6.5–8.1)

## 2015-12-30 LAB — CBC WITH DIFFERENTIAL/PLATELET
Basophils Absolute: 0 10*3/uL (ref 0–0.1)
Basophils Relative: 0 %
EOS ABS: 0 10*3/uL (ref 0–0.7)
EOS PCT: 1 %
HCT: 33.5 % — ABNORMAL LOW (ref 35.0–47.0)
HEMOGLOBIN: 11.3 g/dL — AB (ref 12.0–16.0)
LYMPHS ABS: 1.6 10*3/uL (ref 1.0–3.6)
Lymphocytes Relative: 32 %
MCH: 28.1 pg (ref 26.0–34.0)
MCHC: 33.8 g/dL (ref 32.0–36.0)
MCV: 82.9 fL (ref 80.0–100.0)
MONO ABS: 0.3 10*3/uL (ref 0.2–0.9)
MONOS PCT: 5 %
NEUTROS PCT: 62 %
Neutro Abs: 3.1 10*3/uL (ref 1.4–6.5)
Platelets: 182 10*3/uL (ref 150–440)
RBC: 4.04 MIL/uL (ref 3.80–5.20)
RDW: 13.8 % (ref 11.5–14.5)
WBC: 5 10*3/uL (ref 3.6–11.0)

## 2015-12-30 MED ORDER — CARBOPLATIN CHEMO INJECTION 450 MG/45ML
240.6000 mg | Freq: Once | INTRAVENOUS | Status: AC
  Administered 2015-12-30: 240 mg via INTRAVENOUS
  Filled 2015-12-30: qty 24

## 2015-12-30 MED ORDER — PACLITAXEL CHEMO INJECTION 300 MG/50ML
45.0000 mg/m2 | Freq: Once | INTRAVENOUS | Status: AC
  Administered 2015-12-30: 102 mg via INTRAVENOUS
  Filled 2015-12-30: qty 17

## 2015-12-30 MED ORDER — FAMOTIDINE IN NACL 20-0.9 MG/50ML-% IV SOLN
20.0000 mg | Freq: Once | INTRAVENOUS | Status: AC
  Administered 2015-12-30: 20 mg via INTRAVENOUS
  Filled 2015-12-30: qty 50

## 2015-12-30 MED ORDER — SODIUM CHLORIDE 0.9% FLUSH
10.0000 mL | INTRAVENOUS | Status: DC | PRN
  Administered 2015-12-30: 10 mL via INTRAVENOUS
  Filled 2015-12-30: qty 10

## 2015-12-30 MED ORDER — SODIUM CHLORIDE 0.9 % IV SOLN
Freq: Once | INTRAVENOUS | Status: AC
  Administered 2015-12-30: 11:00:00 via INTRAVENOUS
  Filled 2015-12-30: qty 8

## 2015-12-30 MED ORDER — DIPHENHYDRAMINE HCL 50 MG/ML IJ SOLN
25.0000 mg | Freq: Once | INTRAMUSCULAR | Status: AC
  Administered 2015-12-30: 25 mg via INTRAVENOUS
  Filled 2015-12-30: qty 1

## 2015-12-30 MED ORDER — SODIUM CHLORIDE 0.9 % IV SOLN
Freq: Once | INTRAVENOUS | Status: AC
  Administered 2015-12-30: 10:00:00 via INTRAVENOUS
  Filled 2015-12-30: qty 1000

## 2015-12-30 MED ORDER — HEPARIN SOD (PORK) LOCK FLUSH 100 UNIT/ML IV SOLN
500.0000 [IU] | Freq: Once | INTRAVENOUS | Status: AC
  Administered 2015-12-30: 500 [IU] via INTRAVENOUS
  Filled 2015-12-30: qty 5

## 2015-12-30 NOTE — Progress Notes (Signed)
Patient appetite has slightly decreased.  She is noticing episodes of "feeling like she can't catch breath" and has to take quick breaths for the feeling to go away.  Having constipation has tried Miralax only once and is going to get more to take more regular basis.

## 2015-12-30 NOTE — Progress Notes (Signed)
[place]  [phone]) [phone] [phone]) [phone]  ID: Susan Willis OB: 1951/11/12  MR#: [id number]  OZD#:[id number]  Patient Care Team: Marguerita Merles, MD as PCP - General (Family Medicine)  CHIEF COMPLAINT: Lung mass.  INTERVAL HISTORY: Patient returns to clinic today for further evaluation and initiate cycle 2 of weekly carboplatinum and Taxol along with daily XRT. She currently feels well and is asymptomatic. She does not complain of any further chest pain. She has no neurologic complaints. She denies any recent fevers or illnesses. She has a fair appetite and denies weight loss. She denies cough, or hemoptysis. She has had some shortness of breath that has occurred just a couple times over the past several weeks.  She has no nausea, vomiting or diarrhea but does complain of constipation.  She has no urinary complaints. Patient offers no further specific complaints today.   REVIEW OF SYSTEMS:   Review of Systems  Constitutional: Negative for fever, weight loss and malaise/fatigue.  Respiratory: Positive for shortness of breath. Negative for cough and hemoptysis.   Cardiovascular: Negative for chest pain and leg swelling.  Gastrointestinal: Positive for constipation.  Musculoskeletal: Negative.   Neurological: Negative.  Negative for weakness.    As per HPI. Otherwise, a complete review of systems is negatve.  PAST MEDICAL HISTORY: Past Medical History  Diagnosis Date  . Arthritis   . Diabetes mellitus without complication ([place])   . Hypertension   . High cholesterol   . Blood dyscrasia     PAST SURGICAL HISTORY: Past Surgical History  Procedure Laterality Date  . Abdominal hysterectomy    . Cesarean section    . Endobronchial ultrasound N/A 11/26/2015    Procedure: ENDOBRONCHIAL ULTRASOUND;  Surgeon: Flora Lipps, MD;  Location: [facility];  Service: Cardiopulmonary;  Laterality: N/A;  . Electromagnetic navigation brochoscopy N/A 11/26/2015   Procedure: ELECTROMAGNETIC NAVIGATION BRONCHOSCOPY;  Surgeon: Flora Lipps, MD;  Location: [facility];  Service: Cardiopulmonary;  Laterality: N/A;  . Peripheral vascular catheterization N/A 12/09/2015    Procedure: Glori Luis Cath Insertion;  Surgeon: Katha Cabal, MD;  Location: [place] [facility];  Service: Cardiovascular;  Laterality: N/A;    FAMILY HISTORY: Reviewed and unchanged. No reported history of malignancy or chronic disease.     ADVANCED DIRECTIVES:    HEALTH MAINTENANCE: Social History  Substance Use Topics  . Smoking status: Current Every Day Smoker -- 2.00 packs/day for 35 years    Types: Cigarettes  . Smokeless tobacco: Never Used  . Alcohol Use: No     Colonoscopy:  PAP:  Bone density:  Lipid panel:  No Known Allergies  Current Outpatient Prescriptions  Medication Sig Dispense Refill  . atorvastatin (LIPITOR) 20 MG tablet Take 20 mg by mouth daily at 6 PM.    . insulin glargine (LANTUS) 100 UNIT/ML injection Inject 60 Units into the skin at bedtime.    Marland Kitchen linagliptin (TRADJENTA) 5 MG TABS tablet Take 5 mg by mouth daily.    Marland Kitchen lisinopril (PRINIVIL,ZESTRIL) 10 MG tablet Take 10 mg by mouth daily.    . metFORMIN (GLUMETZA) 500 MG (MOD) 24 hr tablet Take 1,000 mg by mouth 2 (two) times daily with a meal. Reported on 12/23/2015    . ondansetron (ZOFRAN) 8 MG tablet Take 1 tablet (8 mg total) by mouth 2 (two) times daily. Start the day after chemo for 3 days. Then take as needed for nausea or vomiting. 30 tablet 1  . Oxycodone HCl 10 MG TABS Take 1  tablet (10 mg total) by mouth every 6 (six) hours as needed. 60 tablet 0  . prochlorperazine (COMPAZINE) 10 MG tablet Take 1 tablet (10 mg total) by mouth every 6 (six) hours as needed (Nausea or vomiting). 30 tablet 1  . doxycycline (VIBRAMYCIN) 100 MG capsule Take 1 capsule (100 mg) by mouth twice daily for 10 days. (Patient not taking: Reported on 12/23/2015) 20 capsule 0  . fentaNYL (DURAGESIC - DOSED MCG/HR) 25 MCG/HR  patch Place 1 patch (25 mcg total) onto the skin every 3 (three) days. (Patient not taking: Reported on 12/23/2015) 10 patch 0  . lidocaine-prilocaine (EMLA) cream Apply 1 application topically once. Apply to port 1-2 hours prior to chemotherapy. Cover with plastic wrap. (Patient not taking: Reported on 12/30/2015) 30 g 0  . lidocaine-prilocaine (EMLA) cream Apply to affected area once (Patient not taking: Reported on 12/30/2015) 30 g 3  . oxyCODONE-acetaminophen (ROXICET) 5-325 MG tablet Take 1 tablet by mouth every 4 (four) hours as needed for severe pain. (Patient not taking: Reported on 12/30/2015) 6 tablet 0   No current facility-administered medications for this visit.   Facility-Administered Medications Ordered in Other Visits  Medication Dose Route Frequency Provider Last Rate Last Dose  . CARBOplatin (PARAPLATIN) 240 mg in sodium chloride 0.9 % 100 mL chemo infusion  240 mg Intravenous Once [name], MD      . heparin lock flush 100 unit/mL  500 Units Intravenous Once [name], MD      . PACLitaxel (TAXOL) 102 mg in dextrose 5 % 250 mL chemo infusion (</= '80mg'$ /m2)  45 mg/m2 (Treatment Plan Actual) Intravenous Once [name], MD   Stopped at 12/30/15 1155  . sodium chloride flush (NS) 0.9 % injection 10 mL  10 mL Intravenous PRN [name], MD   10 mL at 12/30/15 0851    OBJECTIVE: Filed Vitals:   12/30/15 0912  BP: 132/82  Pulse: 83  Temp: 97.3 F (36.3 C)  Resp: 18     Body mass index is 40.85 kg/(m^2).    ECOG FS:0 - Asymptomatic  General: Well-developed, well-nourished, no acute distress. Eyes: Pink conjunctiva, anicteric sclera. Lungs: Clear to auscultation bilaterally. Heart: Regular rate and rhythm. No rubs, murmurs, or gallops. Abdomen: Soft, nontender, nondistended. No organomegaly noted, normoactive bowel sounds. Musculoskeletal: No edema, cyanosis, or clubbing. Neuro: Alert, answering all questions appropriately. Cranial nerves  grossly intact. Skin: No rashes or petechiae noted. Psych: Normal affect.   LAB RESULTS:  Lab Results  Component Value Date   NA 135 12/30/2015   K 4.2 12/30/2015   CL 102 12/30/2015   CO2 28 12/30/2015   GLUCOSE 229* 12/30/2015   BUN 12 12/30/2015   CREATININE 0.62 12/30/2015   CALCIUM 9.6 12/30/2015   PROT 7.2 12/30/2015   ALBUMIN 3.6 12/30/2015   AST 12* 12/30/2015   ALT 14 12/30/2015   ALKPHOS 90 12/30/2015   BILITOT 0.4 12/30/2015   GFRNONAA >60 12/30/2015   GFRAA >60 12/30/2015    Lab Results  Component Value Date   WBC 5.0 12/30/2015   NEUTROABS 3.1 12/30/2015   HGB 11.3* 12/30/2015   HCT 33.5* 12/30/2015   MCV 82.9 12/30/2015   PLT 182 12/30/2015     STUDIES: No results found.  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   PLAN:    1. Lung mass: PET results reviewed independently and reported as above. Given patient's stage of disease, she will benefit from concurrent chemotherapy  with Taxol and carboplatinum along with daily XRT. Patient will also require an MRI of her brain to complete the staging workup.  Proceed with cycle 2 of weekly carboplatinum and Taxol today. Patient will continue XRT. Return to clinic in 1 week for consideration of cycle 3. Once her XRT is completed, she will likely receive 2 consolidation doses of chemotherapy.  2. Constipation: Miralax as needed.  3. Hypertension: Patient's blood pressure is improved today. Continue current medications as prescribed. Monitor. 4. Shortness of breath: Monitor. 5. Hyperglycemia: Continue diabetic medications as prescribed. Monitor closely since patient is receiving dexamethasone as a premedication.  Patient expressed understanding and was in agreement with this plan. She also understands that She can call clinic at any time with any questions, concerns, or complaints.   Mayra Reel, NP   12/30/2015 11:17 AM   Patient was seen and evaluated independently and I agree with the  assessment and plan as dictated above.  [name], MD 12/30/2015 2:44 PM

## 2015-12-31 ENCOUNTER — Ambulatory Visit
Admission: RE | Admit: 2015-12-31 | Discharge: 2015-12-31 | Disposition: A | Discharge status: Home / Self Care | Payer: PPO | Source: Ambulatory Visit | Attending: Radiation Oncology | Admitting: Radiation Oncology

## 2015-12-31 ENCOUNTER — Telehealth: Payer: Self-pay | Admitting: *Deleted

## 2015-12-31 DIAGNOSIS — Z510 Encounter for antineoplastic radiation therapy: Secondary | ICD-10-CM | POA: Diagnosis not present

## 2015-12-31 MED ORDER — OXYCODONE HCL 10 MG PO TABS: 10.0000 mg | ORAL_TABLET | Freq: Four times a day (QID) | ORAL | Status: DC | PRN

## 2016-01-01 ENCOUNTER — Ambulatory Visit
Admission: RE | Admit: 2016-01-01 | Discharge: 2016-01-01 | Disposition: A | Discharge status: Home / Self Care | Payer: PPO | Source: Ambulatory Visit | Attending: Radiation Oncology | Admitting: Radiation Oncology

## 2016-01-01 DIAGNOSIS — Z510 Encounter for antineoplastic radiation therapy: Secondary | ICD-10-CM | POA: Diagnosis not present

## 2016-01-01 NOTE — Telephone Encounter (Signed)
Informed that prescription is ready to pick up  

## 2016-01-04 ENCOUNTER — Ambulatory Visit: Admission: RE | Admit: 2016-01-04 | Payer: PPO | Source: Ambulatory Visit

## 2016-01-05 ENCOUNTER — Ambulatory Visit
Admission: RE | Admit: 2016-01-05 | Discharge: 2016-01-05 | Disposition: A | Discharge status: Home / Self Care | Payer: PPO | Source: Ambulatory Visit | Attending: Oncology | Admitting: Oncology

## 2016-01-05 ENCOUNTER — Ambulatory Visit
Admission: RE | Admit: 2016-01-05 | Discharge: 2016-01-05 | Disposition: A | Discharge status: Home / Self Care | Payer: PPO | Source: Ambulatory Visit | Attending: Radiation Oncology | Admitting: Radiation Oncology

## 2016-01-05 DIAGNOSIS — C3490 Malignant neoplasm of unspecified part of unspecified bronchus or lung: Secondary | ICD-10-CM | POA: Diagnosis not present

## 2016-01-05 DIAGNOSIS — G935 Compression of brain: Secondary | ICD-10-CM | POA: Insufficient documentation

## 2016-01-05 DIAGNOSIS — I6782 Cerebral ischemia: Secondary | ICD-10-CM | POA: Insufficient documentation

## 2016-01-05 DIAGNOSIS — Z510 Encounter for antineoplastic radiation therapy: Secondary | ICD-10-CM | POA: Diagnosis not present

## 2016-01-05 DIAGNOSIS — C3412 Malignant neoplasm of upper lobe, left bronchus or lung: Secondary | ICD-10-CM | POA: Diagnosis not present

## 2016-01-05 MED ORDER — GADOBENATE DIMEGLUMINE 529 MG/ML IV SOLN
20.0000 mL | Freq: Once | INTRAVENOUS | Status: AC | PRN
  Administered 2016-01-05: 20 mL via INTRAVENOUS

## 2016-01-06 ENCOUNTER — Inpatient Hospital Stay: Payer: PPO

## 2016-01-06 ENCOUNTER — Inpatient Hospital Stay: Payer: PPO | Attending: Oncology

## 2016-01-06 ENCOUNTER — Ambulatory Visit
Admission: RE | Admit: 2016-01-06 | Discharge: 2016-01-06 | Disposition: A | Discharge status: Home / Self Care | Payer: PPO | Source: Ambulatory Visit | Attending: Radiation Oncology | Admitting: Radiation Oncology

## 2016-01-06 ENCOUNTER — Inpatient Hospital Stay (HOSPITAL_BASED_OUTPATIENT_CLINIC_OR_DEPARTMENT_OTHER): Payer: PPO | Admitting: Oncology

## 2016-01-06 VITALS — BP 131/86 | HR 80 | Temp 96.8°F | Resp 18 | Wt 236.3 lb

## 2016-01-06 DIAGNOSIS — Z7984 Long term (current) use of oral hypoglycemic drugs: Secondary | ICD-10-CM | POA: Diagnosis not present

## 2016-01-06 DIAGNOSIS — C3412 Malignant neoplasm of upper lobe, left bronchus or lung: Secondary | ICD-10-CM

## 2016-01-06 DIAGNOSIS — Z79899 Other long term (current) drug therapy: Secondary | ICD-10-CM

## 2016-01-06 DIAGNOSIS — D701 Agranulocytosis secondary to cancer chemotherapy: Secondary | ICD-10-CM | POA: Diagnosis not present

## 2016-01-06 DIAGNOSIS — C7931 Secondary malignant neoplasm of brain: Secondary | ICD-10-CM | POA: Diagnosis not present

## 2016-01-06 DIAGNOSIS — D649 Anemia, unspecified: Secondary | ICD-10-CM

## 2016-01-06 DIAGNOSIS — Z510 Encounter for antineoplastic radiation therapy: Secondary | ICD-10-CM | POA: Diagnosis not present

## 2016-01-06 DIAGNOSIS — F17210 Nicotine dependence, cigarettes, uncomplicated: Secondary | ICD-10-CM | POA: Diagnosis not present

## 2016-01-06 DIAGNOSIS — E7800 Pure hypercholesterolemia, unspecified: Secondary | ICD-10-CM | POA: Insufficient documentation

## 2016-01-06 DIAGNOSIS — E1165 Type 2 diabetes mellitus with hyperglycemia: Secondary | ICD-10-CM | POA: Diagnosis not present

## 2016-01-06 DIAGNOSIS — Z794 Long term (current) use of insulin: Secondary | ICD-10-CM | POA: Diagnosis not present

## 2016-01-06 DIAGNOSIS — I10 Essential (primary) hypertension: Secondary | ICD-10-CM | POA: Insufficient documentation

## 2016-01-06 DIAGNOSIS — M1990 Unspecified osteoarthritis, unspecified site: Secondary | ICD-10-CM | POA: Insufficient documentation

## 2016-01-06 DIAGNOSIS — R0602 Shortness of breath: Secondary | ICD-10-CM | POA: Diagnosis not present

## 2016-01-06 DIAGNOSIS — Z5111 Encounter for antineoplastic chemotherapy: Secondary | ICD-10-CM | POA: Insufficient documentation

## 2016-01-06 DIAGNOSIS — R040 Epistaxis: Secondary | ICD-10-CM | POA: Diagnosis not present

## 2016-01-06 LAB — CBC WITH DIFFERENTIAL/PLATELET
BASOS ABS: 0 10*3/uL (ref 0–0.1)
BASOS PCT: 0 %
Eosinophils Absolute: 0 10*3/uL (ref 0–0.7)
Eosinophils Relative: 1 %
HEMATOCRIT: 32.3 % — AB (ref 35.0–47.0)
HEMOGLOBIN: 10.9 g/dL — AB (ref 12.0–16.0)
Lymphocytes Relative: 34 %
Lymphs Abs: 1.3 10*3/uL (ref 1.0–3.6)
MCH: 28.1 pg (ref 26.0–34.0)
MCHC: 33.8 g/dL (ref 32.0–36.0)
MCV: 83.1 fL (ref 80.0–100.0)
MONO ABS: 0.3 10*3/uL (ref 0.2–0.9)
Monocytes Relative: 7 %
NEUTROS ABS: 2.1 10*3/uL (ref 1.4–6.5)
NEUTROS PCT: 58 %
Platelets: 177 10*3/uL (ref 150–440)
RBC: 3.89 MIL/uL (ref 3.80–5.20)
RDW: 13.8 % (ref 11.5–14.5)
WBC: 3.7 10*3/uL (ref 3.6–11.0)

## 2016-01-06 LAB — COMPREHENSIVE METABOLIC PANEL
ALBUMIN: 3.5 g/dL (ref 3.5–5.0)
ALT: 15 U/L (ref 14–54)
AST: 15 U/L (ref 15–41)
Alkaline Phosphatase: 77 U/L (ref 38–126)
Anion gap: 6 (ref 5–15)
BILIRUBIN TOTAL: 0.5 mg/dL (ref 0.3–1.2)
BUN: 12 mg/dL (ref 6–20)
CO2: 24 mmol/L (ref 22–32)
Calcium: 8.9 mg/dL (ref 8.9–10.3)
Chloride: 104 mmol/L (ref 101–111)
Creatinine, Ser: 0.54 mg/dL (ref 0.44–1.00)
GFR calc Af Amer: >60 mL/min (ref >60)
GFR calc non Af Amer: >60 mL/min (ref >60)
GLUCOSE: 180 mg/dL — AB (ref 65–99)
POTASSIUM: 3.8 mmol/L (ref 3.5–5.1)
Sodium: 134 mmol/L — ABNORMAL LOW (ref 135–145)
TOTAL PROTEIN: 6.9 g/dL (ref 6.5–8.1)

## 2016-01-06 MED ORDER — DEXTROSE 5 % IV SOLN
45.0000 mg/m2 | Freq: Once | INTRAVENOUS | Status: AC
  Administered 2016-01-06: 102 mg via INTRAVENOUS
  Filled 2016-01-06: qty 17

## 2016-01-06 MED ORDER — HEPARIN SOD (PORK) LOCK FLUSH 100 UNIT/ML IV SOLN
500.0000 [IU] | Freq: Once | INTRAVENOUS | Status: AC | PRN
  Administered 2016-01-06: 500 [IU]
  Filled 2016-01-06: qty 5

## 2016-01-06 MED ORDER — SODIUM CHLORIDE 0.9 % IJ SOLN
10.0000 mL | INTRAMUSCULAR | Status: DC | PRN
  Administered 2016-01-06: 10 mL
  Filled 2016-01-06: qty 10

## 2016-01-06 MED ORDER — FAMOTIDINE IN NACL 20-0.9 MG/50ML-% IV SOLN
20.0000 mg | Freq: Once | INTRAVENOUS | Status: AC
Start: 2016-01-06 — End: 2016-01-06
  Administered 2016-01-06: 20 mg via INTRAVENOUS
  Filled 2016-01-06: qty 50

## 2016-01-06 MED ORDER — SODIUM CHLORIDE 0.9 % IV SOLN
Freq: Once | INTRAVENOUS | Status: AC
  Administered 2016-01-06: 11:00:00 via INTRAVENOUS
  Filled 2016-01-06: qty 8

## 2016-01-06 MED ORDER — DIPHENHYDRAMINE HCL 50 MG/ML IJ SOLN
25.0000 mg | Freq: Once | INTRAMUSCULAR | Status: AC
  Administered 2016-01-06: 25 mg via INTRAVENOUS
  Filled 2016-01-06: qty 1

## 2016-01-06 MED ORDER — SODIUM CHLORIDE 0.9 % IV SOLN
Freq: Once | INTRAVENOUS | Status: AC
Start: 2016-01-06 — End: 2016-01-06
  Administered 2016-01-06: 10:00:00 via INTRAVENOUS
  Filled 2016-01-06: qty 1000

## 2016-01-06 MED ORDER — SODIUM CHLORIDE 0.9 % IV SOLN
240.6000 mg | Freq: Once | INTRAVENOUS | Status: AC
  Administered 2016-01-06: 240 mg via INTRAVENOUS
  Filled 2016-01-06: qty 24

## 2016-01-06 NOTE — Progress Notes (Signed)
[place]  Telephone:(336) [phone] Fax:(336) [phone]  ID: Susan Willis OB: February 19, 1951  MR#: [id number]  AST#:[id number]  Patient Care Team: Marguerita Merles, MD as PCP - General (Family Medicine)  CHIEF COMPLAINT: Lung mass.  INTERVAL HISTORY: Patient returns to clinic today for further evaluation and initiate cycle 3 of weekly carboplatinum and Taxol along with daily XRT. She currently feels well and is asymptomatic. She does not complain of any further chest pain. She has no neurologic complaints. She denies any recent fevers or illnesses. She has a fair appetite and denies weight loss. She denies cough, or hemoptysis. She has had some intermittent shortness of breath that has occurred just a couple times over the past several weeks and patient states it is not any worse.  She has no nausea, vomiting, diarrhea or constipation.  She has no urinary complaints. Patient offers no further specific complaints today.   REVIEW OF SYSTEMS:   Review of Systems  Constitutional: Negative for fever, weight loss and malaise/fatigue.  Respiratory: Positive for shortness of breath. Negative for cough and hemoptysis.   Cardiovascular: Negative for chest pain and leg swelling.  Gastrointestinal: Negative.   Musculoskeletal: Negative.   Neurological: Negative.  Negative for weakness.    As per HPI. Otherwise, a complete review of systems is negatve.  PAST MEDICAL HISTORY: Past Medical History  Diagnosis Date  . Arthritis   . Diabetes mellitus without complication ([place])   . Hypertension   . High cholesterol   . Blood dyscrasia     PAST SURGICAL HISTORY: Past Surgical History  Procedure Laterality Date  . Abdominal hysterectomy    . Cesarean section    . Endobronchial ultrasound N/A 11/26/2015    Procedure: ENDOBRONCHIAL ULTRASOUND;  Surgeon: Flora Lipps, MD;  Location: [facility];  Service: Cardiopulmonary;  Laterality: N/A;  . Electromagnetic navigation brochoscopy N/A  11/26/2015    Procedure: ELECTROMAGNETIC NAVIGATION BRONCHOSCOPY;  Surgeon: Flora Lipps, MD;  Location: [facility];  Service: Cardiopulmonary;  Laterality: N/A;  . Peripheral vascular catheterization N/A 12/09/2015    Procedure: Glori Luis Cath Insertion;  Surgeon: Katha Cabal, MD;  Location: [place] [facility];  Service: Cardiovascular;  Laterality: N/A;    FAMILY HISTORY: Reviewed and unchanged. No reported history of malignancy or chronic disease.     ADVANCED DIRECTIVES:    HEALTH MAINTENANCE: Social History  Substance Use Topics  . Smoking status: Current Every Day Smoker -- 2.00 packs/day for 35 years    Types: Cigarettes  . Smokeless tobacco: Never Used  . Alcohol Use: No     Colonoscopy:  PAP:  Bone density:  Lipid panel:  No Known Allergies  Current Outpatient Prescriptions  Medication Sig Dispense Refill  . atorvastatin (LIPITOR) 20 MG tablet Take 20 mg by mouth daily at 6 PM.    . insulin glargine (LANTUS) 100 UNIT/ML injection Inject 60 Units into the skin at bedtime.    . lidocaine-prilocaine (EMLA) cream Apply to affected area once 30 g 3  . linagliptin (TRADJENTA) 5 MG TABS tablet Take 5 mg by mouth daily.    Marland Kitchen lisinopril (PRINIVIL,ZESTRIL) 10 MG tablet Take 10 mg by mouth daily.    . metFORMIN (GLUMETZA) 500 MG (MOD) 24 hr tablet Take 1,000 mg by mouth 2 (two) times daily with a meal. Reported on 12/23/2015    . ondansetron (ZOFRAN) 8 MG tablet Take 1 tablet (8 mg total) by mouth 2 (two) times daily. Start the day after chemo for 3 days. Then  take as needed for nausea or vomiting. 30 tablet 1  . Oxycodone HCl 10 MG TABS Take 1 tablet (10 mg total) by mouth every 6 (six) hours as needed. 60 tablet 0  . prochlorperazine (COMPAZINE) 10 MG tablet Take 1 tablet (10 mg total) by mouth every 6 (six) hours as needed (Nausea or vomiting). 30 tablet 1  . doxycycline (VIBRAMYCIN) 100 MG capsule Take 1 capsule (100 mg) by mouth twice daily for 10 days. (Patient not taking:  Reported on 12/23/2015) 20 capsule 0  . fentaNYL (DURAGESIC - DOSED MCG/HR) 25 MCG/HR patch Place 1 patch (25 mcg total) onto the skin every 3 (three) days. (Patient not taking: Reported on 01/06/2016) 10 patch 0   No current facility-administered medications for this visit.   Facility-Administered Medications Ordered in Other Visits  Medication Dose Route Frequency Provider Last Rate Last Dose  . CARBOplatin (PARAPLATIN) 240 mg in sodium chloride 0.9 % 100 mL chemo infusion  240 mg Intravenous Once [name], MD      . heparin lock flush 100 unit/mL  500 Units Intracatheter Once PRN [name], MD      . PACLitaxel (TAXOL) 102 mg in dextrose 5 % 250 mL chemo infusion (</= '80mg'$ /m2)  45 mg/m2 (Treatment Plan Actual) Intravenous Once [name], MD      . sodium chloride 0.9 % injection 10 mL  10 mL Intracatheter PRN [name], MD   10 mL at 01/06/16 0855    OBJECTIVE: Filed Vitals:   01/06/16 0918  BP: 131/86  Pulse: 80  Temp: 96.8 F (36 C)  Resp: 18     Body mass index is 40.55 kg/(m^2).    ECOG FS:0 - Asymptomatic  General: Well-developed, well-nourished, no acute distress. Eyes: Pink conjunctiva, anicteric sclera. Lungs: Clear to auscultation bilaterally. Heart: Regular rate and rhythm. No rubs, murmurs, or gallops. Abdomen: Soft, nontender, nondistended. No organomegaly noted, normoactive bowel sounds. Musculoskeletal: No edema, cyanosis, or clubbing. Neuro: Alert, answering all questions appropriately. Cranial nerves grossly intact. Skin: No rashes or petechiae noted. Psych: Normal affect.   LAB RESULTS:  Lab Results  Component Value Date   NA 134* 01/06/2016   K 3.8 01/06/2016   CL 104 01/06/2016   CO2 24 01/06/2016   GLUCOSE 180* 01/06/2016   BUN 12 01/06/2016   CREATININE 0.54 01/06/2016   CALCIUM 8.9 01/06/2016   PROT 6.9 01/06/2016   ALBUMIN 3.5 01/06/2016   AST 15 01/06/2016   ALT 15 01/06/2016   ALKPHOS 77 01/06/2016    BILITOT 0.5 01/06/2016   GFRNONAA >60 01/06/2016   GFRAA >60 01/06/2016    Lab Results  Component Value Date   WBC 3.7 01/06/2016   NEUTROABS 2.1 01/06/2016   HGB 10.9* 01/06/2016   HCT 32.3* 01/06/2016   MCV 83.1 01/06/2016   PLT 177 01/06/2016     STUDIES: Mr Jeri Cos BS Contrast  16-Jan-2016  CLINICAL DATA:  Lung cancer staging. EXAM: MRI HEAD WITHOUT AND WITH CONTRAST TECHNIQUE: Multiplanar, multiecho pulse sequences of the brain and surrounding structures were obtained without and with intravenous contrast. CONTRAST:  48m MULTIHANCE GADOBENATE DIMEGLUMINE 529 MG/ML IV SOLN COMPARISON:  None. FINDINGS: Chiari malformation. Cerebellar tonsils impacted extending 13 mm below the foramen magnum. Negative for hydrocephalus. Cerebral volume normal. Negative for acute infarct. Scattered small nonenhancing white matter hyperintensities bilaterally, consistent with chronic microvascular ischemia. Mild chronic microvascular ischemic change in the pons. Negative for hemorrhage or fluid collection. Negative for mass or  edema.  No shift of the midline structures Postcontrast imaging reveals normal enhancement. No enhancing metastatic deposit. Leptomeningeal enhancement normal. Vascular enhancement normal. Mild mucosal edema in the paranasal sinuses without air-fluid level. Empty sella. IMPRESSION: Negative for metastatic disease to the brain Chiari malformation Mild chronic microvascular ischemic change Electronically Signed   By: Franchot Gallo M.D.   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   PLAN:    1. 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 Given patient's stage of disease, she will benefit from concurrent chemotherapy with Taxol and carboplatinum along with daily XRT. Proceed with cycle 3 of weekly carboplatinum and Taxol today. Patient will continue XRT which is  scheduled through February 23rd when the patient will be given 2 weeks off. She will have a chemo holiday during that 2 weeks as well. Return to clinic in 1 week for consideration of cycle 4. Once her XRT is completed, she will likely receive 2 consolidation doses of chemotherapy.  2. Constipation: Resolved. Continue with Miralax as needed.  3. Hypertension: Patient's blood pressure is improved today. Continue current medications as prescribed. Monitor. 4. Shortness of breath: Monitor.  5. Hyperglycemia: Continue diabetic medications as prescribed. Monitor closely since patient is receiving dexamethasone as a premedication. 6. Anemia: Mild, monitor.  Patient expressed understanding and was in agreement with this plan. She also understands that She can call clinic at any time with any questions, concerns, or complaints.   Mayra Reel, NP   01/06/2016 10:50 AM   Patient was seen and evaluated independently and I agree with the assessment and plan as dictated above.  [name], MD 01/10/2016 7:38 AM

## 2016-01-06 NOTE — Progress Notes (Signed)
Patient had 2 episodes of vomiting since treatment.  Still has a dry cough and feeling of SOBr.

## 2016-01-07 ENCOUNTER — Ambulatory Visit: Payer: PPO | Admitting: Oncology

## 2016-01-07 ENCOUNTER — Ambulatory Visit
Admission: RE | Admit: 2016-01-07 | Discharge: 2016-01-07 | Disposition: A | Discharge status: Home / Self Care | Payer: PPO | Source: Ambulatory Visit | Attending: Radiation Oncology | Admitting: Radiation Oncology

## 2016-01-07 DIAGNOSIS — Z510 Encounter for antineoplastic radiation therapy: Secondary | ICD-10-CM | POA: Diagnosis not present

## 2016-01-08 ENCOUNTER — Ambulatory Visit
Admission: RE | Admit: 2016-01-08 | Discharge: 2016-01-08 | Disposition: A | Discharge status: Home / Self Care | Payer: PPO | Source: Ambulatory Visit | Attending: Radiation Oncology | Admitting: Radiation Oncology

## 2016-01-08 DIAGNOSIS — Z510 Encounter for antineoplastic radiation therapy: Secondary | ICD-10-CM | POA: Diagnosis not present

## 2016-01-11 ENCOUNTER — Ambulatory Visit
Admission: RE | Admit: 2016-01-11 | Discharge: 2016-01-11 | Disposition: A | Discharge status: Home / Self Care | Payer: PPO | Source: Ambulatory Visit | Attending: Radiation Oncology | Admitting: Radiation Oncology

## 2016-01-11 DIAGNOSIS — Z510 Encounter for antineoplastic radiation therapy: Secondary | ICD-10-CM | POA: Diagnosis not present

## 2016-01-12 ENCOUNTER — Other Ambulatory Visit: Payer: Self-pay | Admitting: *Deleted

## 2016-01-12 ENCOUNTER — Ambulatory Visit
Admission: RE | Admit: 2016-01-12 | Discharge: 2016-01-12 | Disposition: A | Discharge status: Home / Self Care | Payer: PPO | Source: Ambulatory Visit | Attending: Radiation Oncology | Admitting: Radiation Oncology

## 2016-01-12 DIAGNOSIS — Z510 Encounter for antineoplastic radiation therapy: Secondary | ICD-10-CM | POA: Diagnosis not present

## 2016-01-12 MED ORDER — SUCRALFATE 1 G PO TABS: 1.0000 g | ORAL_TABLET | Freq: Three times a day (TID) | ORAL | Status: DC

## 2016-01-13 ENCOUNTER — Inpatient Hospital Stay (HOSPITAL_BASED_OUTPATIENT_CLINIC_OR_DEPARTMENT_OTHER): Payer: PPO | Admitting: Oncology

## 2016-01-13 ENCOUNTER — Inpatient Hospital Stay: Payer: PPO

## 2016-01-13 ENCOUNTER — Ambulatory Visit
Admission: RE | Admit: 2016-01-13 | Discharge: 2016-01-13 | Disposition: A | Discharge status: Home / Self Care | Payer: PPO | Source: Ambulatory Visit | Attending: Radiation Oncology | Admitting: Radiation Oncology

## 2016-01-13 ENCOUNTER — Other Ambulatory Visit: Payer: Self-pay | Admitting: Oncology

## 2016-01-13 VITALS — BP 123/75 | HR 88 | Resp 20

## 2016-01-13 VITALS — BP 90/60 | Temp 96.7°F | Wt 241.0 lb

## 2016-01-13 DIAGNOSIS — Z510 Encounter for antineoplastic radiation therapy: Secondary | ICD-10-CM | POA: Diagnosis not present

## 2016-01-13 DIAGNOSIS — C3402 Malignant neoplasm of left main bronchus: Secondary | ICD-10-CM

## 2016-01-13 DIAGNOSIS — C7931 Secondary malignant neoplasm of brain: Secondary | ICD-10-CM

## 2016-01-13 DIAGNOSIS — Z5111 Encounter for antineoplastic chemotherapy: Secondary | ICD-10-CM | POA: Diagnosis not present

## 2016-01-13 DIAGNOSIS — E1165 Type 2 diabetes mellitus with hyperglycemia: Secondary | ICD-10-CM

## 2016-01-13 DIAGNOSIS — R0602 Shortness of breath: Secondary | ICD-10-CM | POA: Diagnosis not present

## 2016-01-13 DIAGNOSIS — C3412 Malignant neoplasm of upper lobe, left bronchus or lung: Secondary | ICD-10-CM

## 2016-01-13 DIAGNOSIS — D649 Anemia, unspecified: Secondary | ICD-10-CM

## 2016-01-13 DIAGNOSIS — F17210 Nicotine dependence, cigarettes, uncomplicated: Secondary | ICD-10-CM

## 2016-01-13 DIAGNOSIS — I10 Essential (primary) hypertension: Secondary | ICD-10-CM

## 2016-01-13 DIAGNOSIS — Z79899 Other long term (current) drug therapy: Secondary | ICD-10-CM

## 2016-01-13 LAB — COMPREHENSIVE METABOLIC PANEL
ALBUMIN: 3.5 g/dL (ref 3.5–5.0)
ALK PHOS: 70 U/L (ref 38–126)
ALT: 14 U/L (ref 14–54)
ANION GAP: 6 (ref 5–15)
AST: 15 U/L (ref 15–41)
BILIRUBIN TOTAL: 0.6 mg/dL (ref 0.3–1.2)
BUN: 12 mg/dL (ref 6–20)
CALCIUM: 8.8 mg/dL — AB (ref 8.9–10.3)
CO2: 26 mmol/L (ref 22–32)
Chloride: 105 mmol/L (ref 101–111)
Creatinine, Ser: 0.66 mg/dL (ref 0.44–1.00)
GLUCOSE: 165 mg/dL — AB (ref 65–99)
Potassium: 4.2 mmol/L (ref 3.5–5.1)
Sodium: 137 mmol/L (ref 135–145)
TOTAL PROTEIN: 6.3 g/dL — AB (ref 6.5–8.1)

## 2016-01-13 LAB — CBC WITH DIFFERENTIAL/PLATELET
Basophils Absolute: 0 10*3/uL (ref 0–0.1)
Basophils Relative: 1 %
Eosinophils Absolute: 0 10*3/uL (ref 0–0.7)
Eosinophils Relative: 1 %
HEMATOCRIT: 30.6 % — AB (ref 35.0–47.0)
HEMOGLOBIN: 10.3 g/dL — AB (ref 12.0–16.0)
LYMPHS ABS: 0.8 10*3/uL — AB (ref 1.0–3.6)
LYMPHS PCT: 23 %
MCH: 28.1 pg (ref 26.0–34.0)
MCHC: 33.7 g/dL (ref 32.0–36.0)
MCV: 83.4 fL (ref 80.0–100.0)
MONO ABS: 0.3 10*3/uL (ref 0.2–0.9)
MONOS PCT: 8 %
NEUTROS ABS: 2.4 10*3/uL (ref 1.4–6.5)
NEUTROS PCT: 67 %
Platelets: 149 10*3/uL — ABNORMAL LOW (ref 150–440)
RBC: 3.67 MIL/uL — ABNORMAL LOW (ref 3.80–5.20)
RDW: 14.2 % (ref 11.5–14.5)
WBC: 3.5 10*3/uL — ABNORMAL LOW (ref 3.6–11.0)

## 2016-01-13 MED ORDER — HEPARIN SOD (PORK) LOCK FLUSH 100 UNIT/ML IV SOLN
500.0000 [IU] | Freq: Once | INTRAVENOUS | Status: AC | PRN
  Administered 2016-01-13: 500 [IU]
  Filled 2016-01-13: qty 5

## 2016-01-13 MED ORDER — SODIUM CHLORIDE 0.9 % IV SOLN
Freq: Once | INTRAVENOUS | Status: AC
  Administered 2016-01-13: 10:00:00 via INTRAVENOUS
  Filled 2016-01-13: qty 8

## 2016-01-13 MED ORDER — CARBOPLATIN CHEMO INJECTION 450 MG/45ML
240.6000 mg | Freq: Once | INTRAVENOUS | Status: AC
  Administered 2016-01-13: 240 mg via INTRAVENOUS
  Filled 2016-01-13: qty 24

## 2016-01-13 MED ORDER — SODIUM CHLORIDE 0.9 % IV SOLN
Freq: Once | INTRAVENOUS | Status: AC
  Administered 2016-01-13: 10:00:00 via INTRAVENOUS
  Filled 2016-01-13: qty 1000

## 2016-01-13 MED ORDER — FAMOTIDINE IN NACL 20-0.9 MG/50ML-% IV SOLN
20.0000 mg | Freq: Once | INTRAVENOUS | Status: AC
  Administered 2016-01-13: 20 mg via INTRAVENOUS
  Filled 2016-01-13: qty 50

## 2016-01-13 MED ORDER — DIPHENHYDRAMINE HCL 50 MG/ML IJ SOLN
25.0000 mg | Freq: Once | INTRAMUSCULAR | Status: AC
Start: 2016-01-13 — End: 2016-01-13
  Administered 2016-01-13: 25 mg via INTRAVENOUS
  Filled 2016-01-13: qty 1

## 2016-01-13 MED ORDER — SODIUM CHLORIDE 0.9 % IV SOLN
Freq: Once | INTRAVENOUS | Status: DC
  Filled 2016-01-13: qty 1000

## 2016-01-13 MED ORDER — PACLITAXEL CHEMO INJECTION 300 MG/50ML
45.0000 mg/m2 | Freq: Once | INTRAVENOUS | Status: AC
  Administered 2016-01-13: 102 mg via INTRAVENOUS
  Filled 2016-01-13: qty 17

## 2016-01-13 NOTE — Progress Notes (Signed)
Patient has trouble swallowing and was prescribed Carafate yesterday.  Feeling an increase in her SOBr.  Has occasional nausea that is relieved with medication.  Her blood pressure is low today and has noticed some dizziness.

## 2016-01-13 NOTE — Progress Notes (Signed)
[place]  [phone] [phone]) [phone]  ID: Susan Willis OB: Jan 23, 1951  MR#: [id number]  EHU#:[id number]  Patient Care Team: Marguerita Merles, MD as PCP - General (Family Medicine)  CHIEF COMPLAINT: Lung mass.  INTERVAL HISTORY: Patient returns to clinic today for further evaluation and cycle 4 of weekly carboplatinum and Taxol along with daily XRT. She does complain of left side chest pain and worsening shortness of breath today. She also complains of trouble swallowing. All symptoms from the left side of neck and chest. She also feels more tired over the past couple of weeks. She has no neurologic complaints. She denies any recent fevers or illnesses. She has a fair appetite and denies weight loss. She denies cough, or hemoptysis.  She has no nausea, vomiting, diarrhea or constipation.  She has no urinary complaints. Patient offers no further specific complaints today.   REVIEW OF SYSTEMS:   Review of Systems  Constitutional: Positive for malaise/fatigue. Negative for fever and weight loss.  Respiratory: Positive for shortness of breath. Negative for cough and hemoptysis.   Cardiovascular: Positive for chest pain. Negative for leg swelling.  Gastrointestinal: Negative.   Genitourinary: Negative.   Musculoskeletal: Negative.   Neurological: Negative.  Negative for weakness.    As per HPI. Otherwise, a complete review of systems is negatve.  PAST MEDICAL HISTORY: Past Medical History  Diagnosis Date  . Arthritis   . Diabetes mellitus without complication ([place])   . Hypertension   . High cholesterol   . Blood dyscrasia     PAST SURGICAL HISTORY: Past Surgical History  Procedure Laterality Date  . Abdominal hysterectomy    . Cesarean section    . Endobronchial ultrasound N/A 11/26/2015    Procedure: ENDOBRONCHIAL ULTRASOUND;  Surgeon: Flora Lipps, MD;  Location: [facility];  Service: Cardiopulmonary;  Laterality: N/A;  . Electromagnetic  navigation brochoscopy N/A 11/26/2015    Procedure: ELECTROMAGNETIC NAVIGATION BRONCHOSCOPY;  Surgeon: Flora Lipps, MD;  Location: [facility];  Service: Cardiopulmonary;  Laterality: N/A;  . Peripheral vascular catheterization N/A 12/09/2015    Procedure: Glori Luis Cath Insertion;  Surgeon: Katha Cabal, MD;  Location: [facility];  Service: Cardiovascular;  Laterality: N/A;    FAMILY HISTORY: Reviewed and unchanged. No reported history of malignancy or chronic disease.     ADVANCED DIRECTIVES:    HEALTH MAINTENANCE: Social History  Substance Use Topics  . Smoking status: Current Every Day Smoker -- 2.00 packs/day for 35 years    Types: Cigarettes  . Smokeless tobacco: Never Used  . Alcohol Use: No     Colonoscopy:  PAP:  Bone density:  Lipid panel:  No Known Allergies  Current Outpatient Prescriptions  Medication Sig Dispense Refill  . atorvastatin (LIPITOR) 20 MG tablet Take 20 mg by mouth daily at 6 PM.    . fentaNYL (DURAGESIC - DOSED MCG/HR) 25 MCG/HR patch Place 1 patch (25 mcg total) onto the skin every 3 (three) days. 10 patch 0  . insulin glargine (LANTUS) 100 UNIT/ML injection Inject 60 Units into the skin at bedtime.    . lidocaine-prilocaine (EMLA) cream Apply to affected area once 30 g 3  . linagliptin (TRADJENTA) 5 MG TABS tablet Take 5 mg by mouth daily.    Marland Kitchen lisinopril (PRINIVIL,ZESTRIL) 10 MG tablet Take 10 mg by mouth daily.    . metFORMIN (GLUMETZA) 500 MG (MOD) 24 hr tablet Take 1,000 mg by mouth 2 (two) times daily with a meal. Reported on 12/23/2015    .  ondansetron (ZOFRAN) 8 MG tablet Take 1 tablet (8 mg total) by mouth 2 (two) times daily. Start the day after chemo for 3 days. Then take as needed for nausea or vomiting. 30 tablet 1  . Oxycodone HCl 10 MG TABS Take 1 tablet (10 mg total) by mouth every 6 (six) hours as needed. 60 tablet 0  . prochlorperazine (COMPAZINE) 10 MG tablet Take 1 tablet (10 mg total) by mouth every 6 (six) hours as needed  (Nausea or vomiting). 30 tablet 1  . sucralfate (CARAFATE) 1 g tablet Take 1 tablet (1 g total) by mouth 3 (three) times daily. Dissolve each tablet in 2-3 tbsp of warm water, swish and swallow. 90 tablet 3   No current facility-administered medications for this visit.   Facility-Administered Medications Ordered in Other Visits  Medication Dose Route Frequency Provider Last Rate Last Dose  . 0.9 %  sodium chloride infusion   Intravenous Once [name], MD      . CARBOplatin (PARAPLATIN) 240 mg in sodium chloride 0.9 % 100 mL chemo infusion  240 mg Intravenous Once [name], MD      . diphenhydrAMINE (BENADRYL) injection 25 mg  25 mg Intravenous Once [name], MD      . famotidine (PEPCID) IVPB 20 mg premix  20 mg Intravenous Once [name], MD      . heparin lock flush 100 unit/mL  500 Units Intracatheter Once PRN [name], MD      . ondansetron (ZOFRAN) 16 mg, dexamethasone (DECADRON) 10 mg in sodium chloride 0.9 % 50 mL IVPB   Intravenous Once [name], MD      . PACLitaxel (TAXOL) 102 mg in dextrose 5 % 250 mL chemo infusion (</= '80mg'$ /m2)  45 mg/m2 (Treatment Plan Actual) Intravenous Once [name], MD        OBJECTIVE: Filed Vitals:   01/13/16 0913  BP: 90/60  Temp: 96.7 F (35.9 C)     Body mass index is 41.34 kg/(m^2).    ECOG FS:0 - Asymptomatic  General: Well-developed, well-nourished, no acute distress. Eyes: Pink conjunctiva, anicteric sclera. Lungs: Clear to auscultation bilaterally. Heart: Regular rate and rhythm. No rubs, murmurs, or gallops. Abdomen: Soft, nontender, nondistended. No organomegaly noted, normoactive bowel sounds. Musculoskeletal: No edema, cyanosis, or clubbing. Neuro: Alert, answering all questions appropriately. Cranial nerves grossly intact. Skin: No rashes or petechiae noted. Psych: Normal affect.   LAB RESULTS:  Lab Results  Component Value Date   NA 137 01/13/2016   K 4.2  01/13/2016   CL 105 01/13/2016   CO2 26 01/13/2016   GLUCOSE 165* 01/13/2016   BUN 12 01/13/2016   CREATININE 0.66 01/13/2016   CALCIUM 8.8* 01/13/2016   PROT 6.3* 01/13/2016   ALBUMIN 3.5 01/13/2016   AST 15 01/13/2016   ALT 14 01/13/2016   ALKPHOS 70 01/13/2016   BILITOT 0.6 01/13/2016   GFRNONAA >60 01/13/2016   GFRAA >60 01/13/2016    Lab Results  Component Value Date   WBC 3.5* 01/13/2016   NEUTROABS 2.4 01/13/2016   HGB 10.3* 01/13/2016   HCT 30.6* 01/13/2016   MCV 83.4 01/13/2016   PLT 149* 01/13/2016     STUDIES: Mr Jeri Cos NK Contrast  2016-01-25  CLINICAL DATA:  Lung cancer staging. EXAM: MRI HEAD WITHOUT AND WITH CONTRAST TECHNIQUE: Multiplanar, multiecho pulse sequences of the brain and surrounding structures were obtained without and with intravenous contrast. CONTRAST:  17m MULTIHANCE GADOBENATE DIMEGLUMINE  529 MG/ML IV SOLN COMPARISON:  None. FINDINGS: Chiari malformation. Cerebellar tonsils impacted extending 13 mm below the foramen magnum. Negative for hydrocephalus. Cerebral volume normal. Negative for acute infarct. Scattered small nonenhancing white matter hyperintensities bilaterally, consistent with chronic microvascular ischemia. Mild chronic microvascular ischemic change in the pons. Negative for hemorrhage or fluid collection. Negative for mass or edema.  No shift of the midline structures Postcontrast imaging reveals normal enhancement. No enhancing metastatic deposit. Leptomeningeal enhancement normal. Vascular enhancement normal. Mild mucosal edema in the paranasal sinuses without air-fluid level. Empty sella. IMPRESSION: Negative for metastatic disease to the brain Chiari malformation Mild chronic microvascular ischemic change Electronically Signed   By: Franchot Gallo M.D.   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   PLAN:    1. Lung mass: MRI of the brain results reviewed independently and reported as  above with no evidence of metastatic disease in the brain. Given patient's stage of disease, she will benefit from concurrent chemotherapy with Taxol and carboplatinum along with daily XRT. Proceed with cycle 4 of weekly carboplatinum and Taxol today. Patient will continue XRT which is scheduled through February 23rd when the patient will be given 2 weeks off. She will have a chemo holiday during that 2 weeks as well. Return to clinic in 1 week for consideration of cycle 5. Once her XRT is completed, she will likely receive 2 consolidation doses of chemotherapy.  2. Constipation: Resolved. Continue with Miralax as needed.  3. Hypotension: Patients BP is low today. Will receive 1 L IV Fluids with chemo today.  4. Shortness of breath: Possibly radiation pneumonitis. Offered medrol dose pack but patient refuses at this time. She will call if she changes her mind.  5. Hyperglycemia: Continue diabetic medications as prescribed. Monitor closely since patient is receiving dexamethasone as a premedication. 6. Anemia: Mild, monitor. 7. Swallowing difficulty: Carafate sent to pharmacy yesterday. Patient to start taking it today.  Patient expressed understanding and was in agreement with this plan. She also understands that She can call clinic at any time with any questions, concerns, or complaints.   Mayra Reel, NP   01/13/2016 9:56 AM   Patient was seen and evaluated independently and I agree with the assessment and plan as dictated above.  [name], MD 01/15/2016 12:58 PM

## 2016-01-14 ENCOUNTER — Ambulatory Visit
Admission: RE | Admit: 2016-01-14 | Discharge: 2016-01-14 | Disposition: A | Discharge status: Home / Self Care | Payer: PPO | Source: Ambulatory Visit | Attending: Radiation Oncology | Admitting: Radiation Oncology

## 2016-01-14 DIAGNOSIS — Z510 Encounter for antineoplastic radiation therapy: Secondary | ICD-10-CM | POA: Diagnosis not present

## 2016-01-15 ENCOUNTER — Ambulatory Visit
Admission: RE | Admit: 2016-01-15 | Discharge: 2016-01-15 | Disposition: A | Discharge status: Home / Self Care | Payer: PPO | Source: Ambulatory Visit | Attending: Radiation Oncology | Admitting: Radiation Oncology

## 2016-01-15 DIAGNOSIS — Z510 Encounter for antineoplastic radiation therapy: Secondary | ICD-10-CM | POA: Diagnosis not present

## 2016-01-18 ENCOUNTER — Ambulatory Visit
Admission: RE | Admit: 2016-01-18 | Discharge: 2016-01-18 | Disposition: A | Discharge status: Home / Self Care | Payer: PPO | Source: Ambulatory Visit | Attending: Radiation Oncology | Admitting: Radiation Oncology

## 2016-01-18 DIAGNOSIS — Z510 Encounter for antineoplastic radiation therapy: Secondary | ICD-10-CM | POA: Diagnosis not present

## 2016-01-19 ENCOUNTER — Ambulatory Visit
Admission: RE | Admit: 2016-01-19 | Discharge: 2016-01-19 | Disposition: A | Discharge status: Home / Self Care | Payer: PPO | Source: Ambulatory Visit | Attending: Radiation Oncology | Admitting: Radiation Oncology

## 2016-01-19 DIAGNOSIS — Z510 Encounter for antineoplastic radiation therapy: Secondary | ICD-10-CM | POA: Diagnosis not present

## 2016-01-20 ENCOUNTER — Inpatient Hospital Stay: Payer: PPO

## 2016-01-20 ENCOUNTER — Ambulatory Visit
Admission: RE | Admit: 2016-01-20 | Discharge: 2016-01-20 | Disposition: A | Discharge status: Home / Self Care | Payer: PPO | Source: Ambulatory Visit | Attending: Radiation Oncology | Admitting: Radiation Oncology

## 2016-01-20 ENCOUNTER — Inpatient Hospital Stay (HOSPITAL_BASED_OUTPATIENT_CLINIC_OR_DEPARTMENT_OTHER): Payer: PPO | Admitting: Oncology

## 2016-01-20 VITALS — BP 103/67 | HR 96 | Temp 97.8°F | Resp 18 | Wt 234.8 lb

## 2016-01-20 DIAGNOSIS — Z79899 Other long term (current) drug therapy: Secondary | ICD-10-CM

## 2016-01-20 DIAGNOSIS — D649 Anemia, unspecified: Secondary | ICD-10-CM

## 2016-01-20 DIAGNOSIS — R040 Epistaxis: Secondary | ICD-10-CM

## 2016-01-20 DIAGNOSIS — C7931 Secondary malignant neoplasm of brain: Secondary | ICD-10-CM

## 2016-01-20 DIAGNOSIS — C3412 Malignant neoplasm of upper lobe, left bronchus or lung: Secondary | ICD-10-CM

## 2016-01-20 DIAGNOSIS — D701 Agranulocytosis secondary to cancer chemotherapy: Secondary | ICD-10-CM

## 2016-01-20 DIAGNOSIS — F17210 Nicotine dependence, cigarettes, uncomplicated: Secondary | ICD-10-CM | POA: Diagnosis not present

## 2016-01-20 DIAGNOSIS — R062 Wheezing: Secondary | ICD-10-CM | POA: Diagnosis not present

## 2016-01-20 DIAGNOSIS — E1165 Type 2 diabetes mellitus with hyperglycemia: Secondary | ICD-10-CM

## 2016-01-20 DIAGNOSIS — I10 Essential (primary) hypertension: Secondary | ICD-10-CM

## 2016-01-20 DIAGNOSIS — Z510 Encounter for antineoplastic radiation therapy: Secondary | ICD-10-CM | POA: Diagnosis not present

## 2016-01-20 DIAGNOSIS — Z5111 Encounter for antineoplastic chemotherapy: Secondary | ICD-10-CM | POA: Diagnosis not present

## 2016-01-20 LAB — COMPREHENSIVE METABOLIC PANEL
ALBUMIN: 3.4 g/dL — AB (ref 3.5–5.0)
ALK PHOS: 71 U/L (ref 38–126)
ALT: 15 U/L (ref 14–54)
AST: 14 U/L — ABNORMAL LOW (ref 15–41)
Anion gap: 4 — ABNORMAL LOW (ref 5–15)
BUN: 17 mg/dL (ref 6–20)
CALCIUM: 9.2 mg/dL (ref 8.9–10.3)
CHLORIDE: 105 mmol/L (ref 101–111)
CO2: 25 mmol/L (ref 22–32)
CREATININE: 0.66 mg/dL (ref 0.44–1.00)
GFR calc Af Amer: >60 mL/min (ref >60)
GFR calc non Af Amer: >60 mL/min (ref >60)
GLUCOSE: 216 mg/dL — AB (ref 65–99)
Potassium: 4.2 mmol/L (ref 3.5–5.1)
SODIUM: 134 mmol/L — AB (ref 135–145)
Total Bilirubin: 0.5 mg/dL (ref 0.3–1.2)
Total Protein: 6.6 g/dL (ref 6.5–8.1)

## 2016-01-20 LAB — CBC WITH DIFFERENTIAL/PLATELET
BASOS ABS: 0 10*3/uL (ref 0–0.1)
BASOS PCT: 1 %
EOS ABS: 0 10*3/uL (ref 0–0.7)
Eosinophils Relative: 1 %
HCT: 30.7 % — ABNORMAL LOW (ref 35.0–47.0)
HEMOGLOBIN: 10.4 g/dL — AB (ref 12.0–16.0)
Lymphocytes Relative: 26 %
Lymphs Abs: 0.7 10*3/uL — ABNORMAL LOW (ref 1.0–3.6)
MCH: 28.3 pg (ref 26.0–34.0)
MCHC: 33.7 g/dL (ref 32.0–36.0)
MCV: 84 fL (ref 80.0–100.0)
Monocytes Absolute: 0.3 10*3/uL (ref 0.2–0.9)
Monocytes Relative: 11 %
NEUTROS PCT: 63 %
Neutro Abs: 1.8 10*3/uL (ref 1.4–6.5)
PLATELETS: 127 10*3/uL — AB (ref 150–440)
RBC: 3.66 MIL/uL — AB (ref 3.80–5.20)
RDW: 14.2 % (ref 11.5–14.5)
WBC: 2.8 10*3/uL — AB (ref 3.6–11.0)

## 2016-01-20 MED ORDER — HEPARIN SOD (PORK) LOCK FLUSH 100 UNIT/ML IV SOLN
500.0000 [IU] | Freq: Once | INTRAVENOUS | Status: AC | PRN
  Administered 2016-01-20: 500 [IU]
  Filled 2016-01-20: qty 5

## 2016-01-20 MED ORDER — SODIUM CHLORIDE 0.9 % IV SOLN
Freq: Once | INTRAVENOUS | Status: AC
  Administered 2016-01-20: 10:00:00 via INTRAVENOUS
  Filled 2016-01-20: qty 8

## 2016-01-20 MED ORDER — SODIUM CHLORIDE 0.9 % IJ SOLN
10.0000 mL | INTRAMUSCULAR | Status: DC | PRN
  Administered 2016-01-20: 10 mL
  Filled 2016-01-20: qty 10

## 2016-01-20 MED ORDER — DIPHENHYDRAMINE HCL 50 MG/ML IJ SOLN
25.0000 mg | Freq: Once | INTRAMUSCULAR | Status: AC
Start: 2016-01-20 — End: 2016-01-20
  Administered 2016-01-20: 25 mg via INTRAVENOUS
  Filled 2016-01-20: qty 1

## 2016-01-20 MED ORDER — OXYCODONE HCL 10 MG PO TABS: 10.0000 mg | ORAL_TABLET | Freq: Four times a day (QID) | ORAL | Status: DC | PRN

## 2016-01-20 MED ORDER — SODIUM CHLORIDE 0.9 % IV SOLN
240.6000 mg | Freq: Once | INTRAVENOUS | Status: AC
  Administered 2016-01-20: 240 mg via INTRAVENOUS
  Filled 2016-01-20: qty 24

## 2016-01-20 MED ORDER — PACLITAXEL CHEMO INJECTION 300 MG/50ML
45.0000 mg/m2 | Freq: Once | INTRAVENOUS | Status: AC
  Administered 2016-01-20: 102 mg via INTRAVENOUS
  Filled 2016-01-20: qty 17

## 2016-01-20 MED ORDER — SODIUM CHLORIDE 0.9 % IV SOLN
Freq: Once | INTRAVENOUS | Status: AC
  Administered 2016-01-20: 10:00:00 via INTRAVENOUS
  Filled 2016-01-20: qty 1000

## 2016-01-20 MED ORDER — FAMOTIDINE IN NACL 20-0.9 MG/50ML-% IV SOLN
20.0000 mg | Freq: Once | INTRAVENOUS | Status: AC
  Administered 2016-01-20: 20 mg via INTRAVENOUS
  Filled 2016-01-20: qty 50

## 2016-01-20 NOTE — Progress Notes (Signed)
[facility]  Telephone:(336) [phone] Fax:(336) [phone]  ID: Susan Willis OB: May 25, 1951  MR#: [id number]  JJH#:[id number]  Patient Care Team: Marguerita Merles, MD as PCP - General (Family Medicine)  CHIEF COMPLAINT: Lung mass.  INTERVAL HISTORY: Patient returns to clinic today for further evaluation and cycle 5 of weekly carboplatinum and Taxol along with daily XRT. She does not complain of left side chest pain today. Her shortness of breath is improved, she hasn't had an episode until this morning. She still complains of trouble swallowing. All symptoms from the left side of neck and chest. She also feels more tired over the past couple of weeks. She is also having a small amount of blood from the left nostril but she states she had this even before she was diagnosed with cancer. She has no neurologic complaints. She denies any recent fevers or illnesses. She has a fair appetite and denies weight loss. She denies cough, or hemoptysis.  She has no nausea, vomiting, diarrhea or constipation.  She has no urinary complaints. Patient offers no further specific complaints today.   REVIEW OF SYSTEMS:   Review of Systems  Constitutional: Positive for malaise/fatigue. Negative for fever and weight loss.  HENT: Positive for nosebleeds.   Respiratory: Positive for shortness of breath. Negative for cough and hemoptysis.   Cardiovascular: Negative.  Negative for leg swelling.  Gastrointestinal: Negative.   Genitourinary: Negative.   Musculoskeletal: Negative.   Neurological: Negative.  Negative for weakness.    As per HPI. Otherwise, a complete review of systems is negatve.  PAST MEDICAL HISTORY: Past Medical History  Diagnosis Date  . Arthritis   . Diabetes mellitus without complication ([place])   . Hypertension   . High cholesterol   . Blood dyscrasia     PAST SURGICAL HISTORY: Past Surgical History  Procedure Laterality Date  . Abdominal hysterectomy    . Cesarean  section    . Endobronchial ultrasound N/A 11/26/2015    Procedure: ENDOBRONCHIAL ULTRASOUND;  Surgeon: Flora Lipps, MD;  Location: [facility];  Service: Cardiopulmonary;  Laterality: N/A;  . Electromagnetic navigation brochoscopy N/A 11/26/2015    Procedure: ELECTROMAGNETIC NAVIGATION BRONCHOSCOPY;  Surgeon: Flora Lipps, MD;  Location: [facility];  Service: Cardiopulmonary;  Laterality: N/A;  . Peripheral vascular catheterization N/A 12/09/2015    Procedure: Glori Luis Cath Insertion;  Surgeon: Katha Cabal, MD;  Location: [facility];  Service: Cardiovascular;  Laterality: N/A;    FAMILY HISTORY: Reviewed and unchanged. No reported history of malignancy or chronic disease.     ADVANCED DIRECTIVES:    HEALTH MAINTENANCE: Social History  Substance Use Topics  . Smoking status: Current Every Day Smoker -- 2.00 packs/day for 35 years    Types: Cigarettes  . Smokeless tobacco: Never Used  . Alcohol Use: No     Colonoscopy:  PAP:  Bone density:  Lipid panel:  No Known Allergies  Current Outpatient Prescriptions  Medication Sig Dispense Refill  . atorvastatin (LIPITOR) 20 MG tablet Take 20 mg by mouth daily at 6 PM.    . fentaNYL (DURAGESIC - DOSED MCG/HR) 25 MCG/HR patch Place 1 patch (25 mcg total) onto the skin every 3 (three) days. 10 patch 0  . insulin glargine (LANTUS) 100 UNIT/ML injection Inject 60 Units into the skin at bedtime.    . lidocaine-prilocaine (EMLA) cream Apply to affected area once 30 g 3  . linagliptin (TRADJENTA) 5 MG TABS tablet Take 5 mg by mouth daily.    Marland Kitchen  lisinopril (PRINIVIL,ZESTRIL) 10 MG tablet Take 10 mg by mouth daily.    . metFORMIN (GLUMETZA) 500 MG (MOD) 24 hr tablet Take 1,000 mg by mouth 2 (two) times daily with a meal. Reported on 12/23/2015    . ondansetron (ZOFRAN) 8 MG tablet Take 1 tablet (8 mg total) by mouth 2 (two) times daily. Start the day after chemo for 3 days. Then take as needed for nausea or vomiting. 30 tablet 1  . Oxycodone  HCl 10 MG TABS Take 1 tablet (10 mg total) by mouth every 6 (six) hours as needed. 60 tablet 0  . prochlorperazine (COMPAZINE) 10 MG tablet Take 1 tablet (10 mg total) by mouth every 6 (six) hours as needed (Nausea or vomiting). 30 tablet 1  . sucralfate (CARAFATE) 1 g tablet Take 1 tablet (1 g total) by mouth 3 (three) times daily. Dissolve each tablet in 2-3 tbsp of warm water, swish and swallow. 90 tablet 3   No current facility-administered medications for this visit.    OBJECTIVE: Filed Vitals:   01/20/16 0904  BP: 103/67  Pulse: 96  Temp: 97.8 F (36.6 C)  Resp: 18     Body mass index is 40.28 kg/(m^2).    ECOG FS:0 - Asymptomatic  General: Well-developed, well-nourished, no acute distress. Eyes: Pink conjunctiva, anicteric sclera. Lungs: Clear to auscultation bilaterally. Heart: Regular rate and rhythm. No rubs, murmurs, or gallops. Abdomen: Soft, nontender, nondistended. No organomegaly noted, normoactive bowel sounds. Musculoskeletal: No edema, cyanosis, or clubbing. Neuro: Alert, answering all questions appropriately. Cranial nerves grossly intact. Skin: No rashes or petechiae noted. Psych: Normal affect.   LAB RESULTS:  Lab Results  Component Value Date   NA 134* 01/20/2016   K 4.2 01/20/2016   CL 105 01/20/2016   CO2 25 01/20/2016   GLUCOSE 216* 01/20/2016   BUN 17 01/20/2016   CREATININE 0.66 01/20/2016   CALCIUM 9.2 01/20/2016   PROT 6.6 01/20/2016   ALBUMIN 3.4* 01/20/2016   AST 14* 01/20/2016   ALT 15 01/20/2016   ALKPHOS 71 01/20/2016   BILITOT 0.5 01/20/2016   GFRNONAA >60 01/20/2016   GFRAA >60 01/20/2016    Lab Results  Component Value Date   WBC 2.8* 01/20/2016   NEUTROABS 1.8 01/20/2016   HGB 10.4* 01/20/2016   HCT 30.7* 01/20/2016   MCV 84.0 01/20/2016   PLT 127* 01/20/2016     STUDIES: Mr Jeri Cos Wo Contrast  01/24/16  CLINICAL DATA:  Lung cancer staging. EXAM: MRI HEAD WITHOUT AND WITH CONTRAST TECHNIQUE: Multiplanar, multiecho  pulse sequences of the brain and surrounding structures were obtained without and with intravenous contrast. CONTRAST:  65m MULTIHANCE GADOBENATE DIMEGLUMINE 529 MG/ML IV SOLN COMPARISON:  None. FINDINGS: Chiari malformation. Cerebellar tonsils impacted extending 13 mm below the foramen magnum. Negative for hydrocephalus. Cerebral volume normal. Negative for acute infarct. Scattered small nonenhancing white matter hyperintensities bilaterally, consistent with chronic microvascular ischemia. Mild chronic microvascular ischemic change in the pons. Negative for hemorrhage or fluid collection. Negative for mass or edema.  No shift of the midline structures Postcontrast imaging reveals normal enhancement. No enhancing metastatic deposit. Leptomeningeal enhancement normal. Vascular enhancement normal. Mild mucosal edema in the paranasal sinuses without air-fluid level. Empty sella. IMPRESSION: Negative for metastatic disease to the brain Chiari malformation Mild chronic microvascular ischemic change Electronically Signed   By: CFranchot GalloM.D.   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   PLAN:  1. Lung mass: MRI of the brain results reviewed independently and reported as above with no evidence of metastatic disease in the brain. Given patient's stage of disease, she will benefit from concurrent chemotherapy with Taxol and carboplatinum along with daily XRT. Proceed with cycle 5 of weekly carboplatinum and Taxol today. Patient will continue XRT which is scheduled through February 23rd when the patient will be given 2 weeks off. She will have a chemo holiday during that 2 weeks as well. Return to clinic in 1 week for consideration of cycle 6. Once her XRT is completed, she will likely receive 2 consolidation doses of chemotherapy.  2. Constipation: Resolved. Continue with Miralax as needed.  3. Hypotension: Monitor. 4. Shortness of breath: Possibly radiation  pneumonitis. Still refuses medrol dose pack. Possibly related to anxiety given that it only happened this morning coming to chemotherapy.  5. Hyperglycemia: Continue diabetic medications as prescribed. Monitor closely since patient is receiving dexamethasone as a premedication. 6. Anemia: Mild, monitor. 7. Swallowing difficulty: Continue carafate. Offered dukes mouthwash but patient refuses at this time.  8. Nosebleed: Monitor 9. Leukopenia: Secondary to chemotherapy, monitor.  Patient expressed understanding and was in agreement with this plan. She also understands that She can call clinic at any time with any questions, concerns, or complaints.   Mayra Reel, NP   01/20/2016 9:38 AM   Patient was seen and evaluated independently and I agree with the assessment and plan as dictated above.  [name], MD 01/24/2016 7:32 AM

## 2016-01-20 NOTE — Progress Notes (Signed)
Patient still has difficulty swallowing.  Also started having drainage from left nostril that is bloody but she states this was happening before cancer diagnosis.

## 2016-01-21 ENCOUNTER — Ambulatory Visit
Admission: RE | Admit: 2016-01-21 | Discharge: 2016-01-21 | Disposition: A | Discharge status: Home / Self Care | Payer: PPO | Source: Ambulatory Visit | Attending: Radiation Oncology | Admitting: Radiation Oncology

## 2016-01-21 DIAGNOSIS — Z510 Encounter for antineoplastic radiation therapy: Secondary | ICD-10-CM | POA: Diagnosis not present

## 2016-01-22 ENCOUNTER — Ambulatory Visit: Payer: PPO

## 2016-01-25 ENCOUNTER — Ambulatory Visit
Admission: RE | Admit: 2016-01-25 | Discharge: 2016-01-25 | Disposition: A | Discharge status: Home / Self Care | Payer: PPO | Source: Ambulatory Visit | Attending: Radiation Oncology | Admitting: Radiation Oncology

## 2016-01-25 DIAGNOSIS — Z510 Encounter for antineoplastic radiation therapy: Secondary | ICD-10-CM | POA: Diagnosis not present

## 2016-01-26 ENCOUNTER — Ambulatory Visit
Admission: RE | Admit: 2016-01-26 | Discharge: 2016-01-26 | Disposition: A | Discharge status: Home / Self Care | Payer: PPO | Source: Ambulatory Visit | Attending: Radiation Oncology | Admitting: Radiation Oncology

## 2016-01-26 DIAGNOSIS — Z510 Encounter for antineoplastic radiation therapy: Secondary | ICD-10-CM | POA: Diagnosis not present

## 2016-01-27 ENCOUNTER — Inpatient Hospital Stay (HOSPITAL_BASED_OUTPATIENT_CLINIC_OR_DEPARTMENT_OTHER): Payer: PPO | Admitting: Oncology

## 2016-01-27 ENCOUNTER — Inpatient Hospital Stay: Payer: PPO

## 2016-01-27 ENCOUNTER — Ambulatory Visit
Admission: RE | Admit: 2016-01-27 | Discharge: 2016-01-27 | Disposition: A | Discharge status: Home / Self Care | Payer: PPO | Source: Ambulatory Visit | Attending: Radiation Oncology | Admitting: Radiation Oncology

## 2016-01-27 VITALS — BP 109/72 | HR 83 | Temp 96.5°F | Resp 16 | Wt 231.5 lb

## 2016-01-27 VITALS — BP 133/82 | HR 79

## 2016-01-27 DIAGNOSIS — I10 Essential (primary) hypertension: Secondary | ICD-10-CM

## 2016-01-27 DIAGNOSIS — Z5111 Encounter for antineoplastic chemotherapy: Secondary | ICD-10-CM | POA: Diagnosis not present

## 2016-01-27 DIAGNOSIS — R0602 Shortness of breath: Secondary | ICD-10-CM | POA: Diagnosis not present

## 2016-01-27 DIAGNOSIS — D649 Anemia, unspecified: Secondary | ICD-10-CM

## 2016-01-27 DIAGNOSIS — R040 Epistaxis: Secondary | ICD-10-CM

## 2016-01-27 DIAGNOSIS — C3412 Malignant neoplasm of upper lobe, left bronchus or lung: Secondary | ICD-10-CM

## 2016-01-27 DIAGNOSIS — C7931 Secondary malignant neoplasm of brain: Secondary | ICD-10-CM | POA: Diagnosis not present

## 2016-01-27 DIAGNOSIS — D701 Agranulocytosis secondary to cancer chemotherapy: Secondary | ICD-10-CM

## 2016-01-27 DIAGNOSIS — E1165 Type 2 diabetes mellitus with hyperglycemia: Secondary | ICD-10-CM

## 2016-01-27 DIAGNOSIS — F17210 Nicotine dependence, cigarettes, uncomplicated: Secondary | ICD-10-CM

## 2016-01-27 DIAGNOSIS — Z79899 Other long term (current) drug therapy: Secondary | ICD-10-CM

## 2016-01-27 DIAGNOSIS — Z510 Encounter for antineoplastic radiation therapy: Secondary | ICD-10-CM | POA: Diagnosis not present

## 2016-01-27 LAB — COMPREHENSIVE METABOLIC PANEL
ALT: 16 U/L (ref 14–54)
ANION GAP: 5 (ref 5–15)
AST: 14 U/L — ABNORMAL LOW (ref 15–41)
Albumin: 3.6 g/dL (ref 3.5–5.0)
Alkaline Phosphatase: 62 U/L (ref 38–126)
BUN: 17 mg/dL (ref 6–20)
CHLORIDE: 108 mmol/L (ref 101–111)
CO2: 25 mmol/L (ref 22–32)
CREATININE: 0.73 mg/dL (ref 0.44–1.00)
Calcium: 8.9 mg/dL (ref 8.9–10.3)
Glucose, Bld: 200 mg/dL — ABNORMAL HIGH (ref 65–99)
POTASSIUM: 4.2 mmol/L (ref 3.5–5.1)
SODIUM: 138 mmol/L (ref 135–145)
Total Bilirubin: 0.5 mg/dL (ref 0.3–1.2)
Total Protein: 6.4 g/dL — ABNORMAL LOW (ref 6.5–8.1)

## 2016-01-27 LAB — CBC WITH DIFFERENTIAL/PLATELET
Basophils Absolute: 0 10*3/uL (ref 0–0.1)
Basophils Relative: 1 %
EOS ABS: 0 10*3/uL (ref 0–0.7)
EOS PCT: 1 %
HCT: 30.2 % — ABNORMAL LOW (ref 35.0–47.0)
Hemoglobin: 10.1 g/dL — ABNORMAL LOW (ref 12.0–16.0)
LYMPHS ABS: 0.7 10*3/uL — AB (ref 1.0–3.6)
LYMPHS PCT: 29 %
MCH: 28.5 pg (ref 26.0–34.0)
MCHC: 33.6 g/dL (ref 32.0–36.0)
MCV: 84.9 fL (ref 80.0–100.0)
MONO ABS: 0.2 10*3/uL (ref 0.2–0.9)
MONOS PCT: 9 %
Neutro Abs: 1.4 10*3/uL (ref 1.4–6.5)
Neutrophils Relative %: 60 %
PLATELETS: 106 10*3/uL — AB (ref 150–440)
RBC: 3.55 MIL/uL — ABNORMAL LOW (ref 3.80–5.20)
RDW: 15 % — AB (ref 11.5–14.5)
WBC: 2.3 10*3/uL — ABNORMAL LOW (ref 3.6–11.0)

## 2016-01-27 MED ORDER — SODIUM CHLORIDE 0.9 % IV SOLN
Freq: Once | INTRAVENOUS | Status: AC
  Administered 2016-01-27: 10:00:00 via INTRAVENOUS
  Filled 2016-01-27: qty 8

## 2016-01-27 MED ORDER — SODIUM CHLORIDE 0.9 % IJ SOLN
10.0000 mL | INTRAMUSCULAR | Status: DC | PRN
  Administered 2016-01-27: 10 mL
  Filled 2016-01-27: qty 10

## 2016-01-27 MED ORDER — DEXTROSE 5 % IV SOLN
45.0000 mg/m2 | Freq: Once | INTRAVENOUS | Status: AC
Start: 2016-01-27 — End: 2016-01-27
  Administered 2016-01-27: 102 mg via INTRAVENOUS
  Filled 2016-01-27: qty 17

## 2016-01-27 MED ORDER — SODIUM CHLORIDE 0.9 % IV SOLN
Freq: Once | INTRAVENOUS | Status: AC
  Administered 2016-01-27: 10:00:00 via INTRAVENOUS
  Filled 2016-01-27: qty 1000

## 2016-01-27 MED ORDER — DIPHENHYDRAMINE HCL 50 MG/ML IJ SOLN
25.0000 mg | Freq: Once | INTRAMUSCULAR | Status: AC
  Administered 2016-01-27: 25 mg via INTRAVENOUS
  Filled 2016-01-27: qty 1

## 2016-01-27 MED ORDER — SODIUM CHLORIDE 0.9 % IV SOLN
240.6000 mg | Freq: Once | INTRAVENOUS | Status: AC
  Administered 2016-01-27: 240 mg via INTRAVENOUS
  Filled 2016-01-27: qty 24

## 2016-01-27 MED ORDER — HEPARIN SOD (PORK) LOCK FLUSH 100 UNIT/ML IV SOLN
500.0000 [IU] | Freq: Once | INTRAVENOUS | Status: AC | PRN
  Administered 2016-01-27: 500 [IU]
  Filled 2016-01-27: qty 5

## 2016-01-27 MED ORDER — FAMOTIDINE IN NACL 20-0.9 MG/50ML-% IV SOLN
20.0000 mg | Freq: Once | INTRAVENOUS | Status: AC
  Administered 2016-01-27: 20 mg via INTRAVENOUS
  Filled 2016-01-27: qty 50

## 2016-01-27 NOTE — Progress Notes (Signed)
Patient has occasional episodes of feeling dizzy.  She has been having SOBr and noticed this morning that it only happens after putting the EMLA cream on the port area.

## 2016-01-27 NOTE — Progress Notes (Signed)
[place]  [phone] [phone]  ID: Susan Willis OB: 11-09-1951  MR#: [id number]  ELF#:[id number]  Patient Care Team: Marguerita Merles, MD as PCP - General (Family Medicine)  CHIEF COMPLAINT: Lung mass.  INTERVAL HISTORY: Patient returns to clinic today for further evaluation and cycle 6 of weekly carboplatinum and Taxol along with daily XRT. She does not complain of left side chest pain today. Her shortness of breath is improved, she continues to notice it only on the morning of treatment. She does not complain of trouble swallowing today. She is taking carafate and states it seems to be working well.  She stills has mild fatigue but if she rests during the day she does fine. She only noticed blood from her nose once last week and thinks it is improving.  She has no neurologic complaints. She denies any recent fevers or illnesses. She has a fair appetite and denies weight loss. She denies cough, or hemoptysis.  She has no nausea, vomiting, diarrhea or constipation.  She has no urinary complaints. Patient offers no further specific complaints today.   REVIEW OF SYSTEMS:   Review of Systems  Constitutional: Positive for malaise/fatigue. Negative for fever and weight loss.  HENT: Negative.   Respiratory: Positive for shortness of breath. Negative for cough and hemoptysis.   Cardiovascular: Negative.  Negative for leg swelling.  Gastrointestinal: Negative.   Genitourinary: Negative.   Musculoskeletal: Negative.   Neurological: Negative.  Negative for weakness.    As per HPI. Otherwise, a complete review of systems is negatve.  PAST MEDICAL HISTORY: Past Medical History  Diagnosis Date  . Arthritis   . Diabetes mellitus without complication ([place])   . Hypertension   . High cholesterol   . Blood dyscrasia     PAST SURGICAL HISTORY: Past Surgical History  Procedure Laterality Date  . Abdominal hysterectomy    . Cesarean section    .  Endobronchial ultrasound N/A 11/26/2015    Procedure: ENDOBRONCHIAL ULTRASOUND;  Surgeon: Flora Lipps, MD;  Location: [facility];  Service: Cardiopulmonary;  Laterality: N/A;  . Electromagnetic navigation brochoscopy N/A 11/26/2015    Procedure: ELECTROMAGNETIC NAVIGATION BRONCHOSCOPY;  Surgeon: Flora Lipps, MD;  Location: [facility];  Service: Cardiopulmonary;  Laterality: N/A;  . Peripheral vascular catheterization N/A 12/09/2015    Procedure: Glori Luis Cath Insertion;  Surgeon: Katha Cabal, MD;  Location: [facility];  Service: Cardiovascular;  Laterality: N/A;    FAMILY HISTORY: Reviewed and unchanged. No reported history of malignancy or chronic disease.     ADVANCED DIRECTIVES:    HEALTH MAINTENANCE: Social History  Substance Use Topics  . Smoking status: Current Every Day Smoker -- 2.00 packs/day for 35 years    Types: Cigarettes  . Smokeless tobacco: Never Used  . Alcohol Use: No     Colonoscopy:  PAP:  Bone density:  Lipid panel:  No Known Allergies  Current Outpatient Prescriptions  Medication Sig Dispense Refill  . atorvastatin (LIPITOR) 20 MG tablet Take 20 mg by mouth daily at 6 PM.    . fentaNYL (DURAGESIC - DOSED MCG/HR) 25 MCG/HR patch Place 1 patch (25 mcg total) onto the skin every 3 (three) days. 10 patch 0  . insulin glargine (LANTUS) 100 UNIT/ML injection Inject 60 Units into the skin at bedtime.    . lidocaine-prilocaine (EMLA) cream Apply to affected area once 30 g 3  . linagliptin (TRADJENTA) 5 MG TABS tablet Take 5 mg by mouth daily.    Marland Kitchen  lisinopril (PRINIVIL,ZESTRIL) 10 MG tablet Take 10 mg by mouth daily.    . metFORMIN (GLUMETZA) 500 MG (MOD) 24 hr tablet Take 1,000 mg by mouth 2 (two) times daily with a meal. Reported on 12/23/2015    . ondansetron (ZOFRAN) 8 MG tablet Take 1 tablet (8 mg total) by mouth 2 (two) times daily. Start the day after chemo for 3 days. Then take as needed for nausea or vomiting. 30 tablet 1  . Oxycodone HCl 10 MG TABS  Take 1 tablet (10 mg total) by mouth every 6 (six) hours as needed. 60 tablet 0  . prochlorperazine (COMPAZINE) 10 MG tablet Take 1 tablet (10 mg total) by mouth every 6 (six) hours as needed (Nausea or vomiting). 30 tablet 1  . sucralfate (CARAFATE) 1 g tablet Take 1 tablet (1 g total) by mouth 3 (three) times daily. Dissolve each tablet in 2-3 tbsp of warm water, swish and swallow. 90 tablet 3   No current facility-administered medications for this visit.    OBJECTIVE: Filed Vitals:   01/27/16 0911  BP: 109/72  Pulse: 83  Temp: 96.5 F (35.8 C)  Resp: 16     Body mass index is 39.71 kg/(m^2).    ECOG FS:0 - Asymptomatic  General: Well-developed, well-nourished, no acute distress. Eyes: Pink conjunctiva, anicteric sclera. Lungs: Clear to auscultation bilaterally. Heart: Regular rate and rhythm. No rubs, murmurs, or gallops. Abdomen: Soft, nontender, nondistended. No organomegaly noted, normoactive bowel sounds. Musculoskeletal: No edema, cyanosis, or clubbing. Neuro: Alert, answering all questions appropriately. Cranial nerves grossly intact. Skin: No rashes or petechiae noted. Psych: Normal affect.   LAB RESULTS:  Lab Results  Component Value Date   NA 138 01/27/2016   K 4.2 01/27/2016   CL 108 01/27/2016   CO2 25 01/27/2016   GLUCOSE 200* 01/27/2016   BUN 17 01/27/2016   CREATININE 0.73 01/27/2016   CALCIUM 8.9 01/27/2016   PROT 6.4* 01/27/2016   ALBUMIN 3.6 01/27/2016   AST 14* 01/27/2016   ALT 16 01/27/2016   ALKPHOS 62 01/27/2016   BILITOT 0.5 01/27/2016   GFRNONAA >60 01/27/2016   GFRAA >60 01/27/2016    Lab Results  Component Value Date   WBC 2.3* 01/27/2016   NEUTROABS 1.4 01/27/2016   HGB 10.1* 01/27/2016   HCT 30.2* 01/27/2016   MCV 84.9 01/27/2016   PLT 106* 01/27/2016     STUDIES: Mr Jeri Cos BL Contrast  January 19, 2016  CLINICAL DATA:  Lung cancer staging. EXAM: MRI HEAD WITHOUT AND WITH CONTRAST TECHNIQUE: Multiplanar, multiecho pulse sequences  of the brain and surrounding structures were obtained without and with intravenous contrast. CONTRAST:  52m MULTIHANCE GADOBENATE DIMEGLUMINE 529 MG/ML IV SOLN COMPARISON:  None. FINDINGS: Chiari malformation. Cerebellar tonsils impacted extending 13 mm below the foramen magnum. Negative for hydrocephalus. Cerebral volume normal. Negative for acute infarct. Scattered small nonenhancing white matter hyperintensities bilaterally, consistent with chronic microvascular ischemia. Mild chronic microvascular ischemic change in the pons. Negative for hemorrhage or fluid collection. Negative for mass or edema.  No shift of the midline structures Postcontrast imaging reveals normal enhancement. No enhancing metastatic deposit. Leptomeningeal enhancement normal. Vascular enhancement normal. Mild mucosal edema in the paranasal sinuses without air-fluid level. Empty sella. IMPRESSION: Negative for metastatic disease to the brain Chiari malformation Mild chronic microvascular ischemic change Electronically Signed   By: CFranchot GalloM.D.   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   PLAN:  1. Lung mass: MRI of the brain results reviewed independently and reported as above with no evidence of metastatic disease in the brain. Given patient's stage of disease, she will benefit from concurrent chemotherapy with Taxol and carboplatinum along with daily XRT. Proceed with cycle 6 of weekly carboplatinum and Taxol today. Her last XRT is scheduled for February 27th and the patient will be given 2 weeks off.  She will also be given 3 weeks off from chemo at this time. Return to clinic in 3 weeks for consideration of cycle 7. She will have 2 additional weekly cycles and then once her XRT is completed, she will likely receive 2 consolidation doses of chemotherapy.  2. Nosebleed: Monitor 3. Hypotension: Monitor. 4. Shortness of breath: Possibly radiation pneumonitis. Still refuses medrol  dose pack. Possibly related to anxiety given that it only happened this morning coming to chemotherapy.  5. Hyperglycemia: Continue diabetic medications as prescribed. Monitor closely since patient is receiving dexamethasone as a premedication. 6. Anemia: Mild, monitor. 7. Swallowing difficulty: Resolved. Continue carafate. Offered dukes mouthwash but patient refuses at this time.  8. Leukopenia: Secondary to chemotherapy. Monitor.  Patient expressed understanding and was in agreement with this plan. She also understands that She can call clinic at any time with any questions, concerns, or complaints.   Mayra Reel, NP   01/27/2016 9:47 AM   Patient was seen and evaluated independently and I agree with the assessment and plan as dictated above.    [name], MD 01/30/2016 7:50 AM

## 2016-01-28 ENCOUNTER — Ambulatory Visit
Admission: RE | Admit: 2016-01-28 | Discharge: 2016-01-28 | Disposition: A | Discharge status: Home / Self Care | Payer: PPO | Source: Ambulatory Visit | Attending: Radiation Oncology | Admitting: Radiation Oncology

## 2016-01-28 DIAGNOSIS — Z510 Encounter for antineoplastic radiation therapy: Secondary | ICD-10-CM | POA: Diagnosis not present

## 2016-01-29 ENCOUNTER — Ambulatory Visit
Admission: RE | Admit: 2016-01-29 | Discharge: 2016-01-29 | Disposition: A | Discharge status: Home / Self Care | Payer: PPO | Source: Ambulatory Visit | Attending: Radiation Oncology | Admitting: Radiation Oncology

## 2016-01-29 DIAGNOSIS — Z510 Encounter for antineoplastic radiation therapy: Secondary | ICD-10-CM | POA: Diagnosis not present

## 2016-02-01 ENCOUNTER — Ambulatory Visit
Admission: RE | Admit: 2016-02-01 | Discharge: 2016-02-01 | Disposition: A | Discharge status: Home / Self Care | Payer: PPO | Source: Ambulatory Visit | Attending: Radiation Oncology | Admitting: Radiation Oncology

## 2016-02-01 DIAGNOSIS — Z510 Encounter for antineoplastic radiation therapy: Secondary | ICD-10-CM | POA: Diagnosis not present

## 2016-02-02 ENCOUNTER — Encounter: Payer: Self-pay | Admitting: *Deleted

## 2016-02-02 ENCOUNTER — Telehealth: Payer: Self-pay | Admitting: *Deleted

## 2016-02-02 NOTE — Telephone Encounter (Signed)
Tom-dental student called stating pt is having tooth removal tomorrow and needs clearance for her procedure tom.  I verbally went over the labs that she had on 2/22. Gershon Mussel was fine with her lab values.  i offered that if they prefer we can have patient come in today and he said no.  Just send a letter to him with copy of labs to his email [email or web address]  Letter done and sent to his email.

## 2016-02-08 ENCOUNTER — Telehealth: Payer: Self-pay | Admitting: *Deleted

## 2016-02-08 ENCOUNTER — Ambulatory Visit
Admission: RE | Admit: 2016-02-08 | Discharge: 2016-02-08 | Disposition: A | Discharge status: Home / Self Care | Payer: PPO | Source: Ambulatory Visit | Attending: Radiation Oncology | Admitting: Radiation Oncology

## 2016-02-08 ENCOUNTER — Encounter: Payer: Self-pay | Admitting: Radiation Oncology

## 2016-02-08 VITALS — BP 114/76 | HR 97 | Temp 95.7°F | Resp 18 | Wt 227.1 lb

## 2016-02-08 DIAGNOSIS — C3492 Malignant neoplasm of unspecified part of left bronchus or lung: Secondary | ICD-10-CM | POA: Insufficient documentation

## 2016-02-08 DIAGNOSIS — C3412 Malignant neoplasm of upper lobe, left bronchus or lung: Secondary | ICD-10-CM

## 2016-02-08 DIAGNOSIS — R1310 Dysphagia, unspecified: Secondary | ICD-10-CM | POA: Insufficient documentation

## 2016-02-08 DIAGNOSIS — Z510 Encounter for antineoplastic radiation therapy: Secondary | ICD-10-CM | POA: Insufficient documentation

## 2016-02-08 MED ORDER — OXYCODONE HCL 10 MG PO TABS: 10.0000 mg | ORAL_TABLET | Freq: Four times a day (QID) | ORAL | Status: DC | PRN

## 2016-02-08 NOTE — Telephone Encounter (Signed)
Informed that prescription is ready to pick up  

## 2016-02-08 NOTE — Progress Notes (Signed)
Radiation Oncology Follow up Note  Name: Susan Willis   Date:   02/08/2016 MRN:  [id number] DOB: 10-Nov-1951    This 64 y.o. female presents to the clinic today for follow-up for initial radiation therapy for stage IIIa non-small cell lung cancer.  REFERRING PROVIDER: Marguerita Merles, MD  HPI: Patient is a 65 year old female who completed 4 weeks of concurrent chemoradiation for stage IIIa (T4 N2 M0) non-small cell lung cancer of the left lung. She is seen today after approximate 1 week break doing fairly well still having some minor dysphagia. She's eating a soft food diet knows cough hemoptysis or chest tightness her breathing is good.. She has been receiving weekly carboplatinum and Taxol.  COMPLICATIONS OF TREATMENT: none  FOLLOW UP COMPLIANCE: keeps appointments   PHYSICAL EXAM:  BP 114/76 mmHg  Pulse 97  Temp(Src) 95.7 F (35.4 C)  Resp 18  Wt 227 lb 1.2 oz (103 kg) Well-developed well-nourished patient in NAD. HEENT reveals PERLA, EOMI, discs not visualized.  Oral cavity is clear. No oral mucosal lesions are identified. Neck is clear without evidence of cervical or supraclavicular adenopathy. Lungs are clear to A&P. Cardiac examination is essentially unremarkable with regular rate and rhythm without murmur rub or thrill. Abdomen is benign with no organomegaly or masses noted. Motor sensory and DTR levels are equal and symmetric in the upper and lower extremities. Cranial nerves II through XII are grossly intact. Proprioception is intact. No peripheral adenopathy or edema is identified. No motor or sensory levels are noted. Crude visual fields are within normal range.  RADIOLOGY RESULTS: No current films for review  PLAN: At this time in split course fashion like to repeat her CT scan and evaluate for response. Should we see an excellent response will continue with another 2 weeks of radiation therapy with concurrent chemotherapy. Risks and benefits of further chemoradiation again  were discussed with the patient and she seems to comprehend my treatment plan well. Will discuss findings of the next CT scan with her at that time. I have set up and ordered CT simulation for small field boost.  I would like to take this opportunity for allowing me to participate in the care of your patient.Armstead Peaks., MD

## 2016-02-11 ENCOUNTER — Ambulatory Visit
Admission: RE | Admit: 2016-02-11 | Discharge: 2016-02-11 | Disposition: A | Discharge status: Home / Self Care | Payer: PPO | Source: Ambulatory Visit | Attending: Radiation Oncology | Admitting: Radiation Oncology

## 2016-02-11 DIAGNOSIS — R1310 Dysphagia, unspecified: Secondary | ICD-10-CM | POA: Diagnosis not present

## 2016-02-11 DIAGNOSIS — C3412 Malignant neoplasm of upper lobe, left bronchus or lung: Secondary | ICD-10-CM | POA: Diagnosis not present

## 2016-02-11 DIAGNOSIS — F17210 Nicotine dependence, cigarettes, uncomplicated: Secondary | ICD-10-CM | POA: Diagnosis not present

## 2016-02-11 DIAGNOSIS — Z510 Encounter for antineoplastic radiation therapy: Secondary | ICD-10-CM | POA: Diagnosis not present

## 2016-02-11 DIAGNOSIS — C3492 Malignant neoplasm of unspecified part of left bronchus or lung: Secondary | ICD-10-CM | POA: Diagnosis not present

## 2016-02-15 DIAGNOSIS — Z510 Encounter for antineoplastic radiation therapy: Secondary | ICD-10-CM | POA: Diagnosis not present

## 2016-02-15 DIAGNOSIS — C3412 Malignant neoplasm of upper lobe, left bronchus or lung: Secondary | ICD-10-CM | POA: Diagnosis not present

## 2016-02-15 DIAGNOSIS — F17210 Nicotine dependence, cigarettes, uncomplicated: Secondary | ICD-10-CM | POA: Diagnosis not present

## 2016-02-17 ENCOUNTER — Inpatient Hospital Stay: Payer: PPO | Attending: Internal Medicine

## 2016-02-17 ENCOUNTER — Inpatient Hospital Stay (HOSPITAL_BASED_OUTPATIENT_CLINIC_OR_DEPARTMENT_OTHER): Payer: PPO | Admitting: Internal Medicine

## 2016-02-17 ENCOUNTER — Inpatient Hospital Stay: Payer: PPO

## 2016-02-17 ENCOUNTER — Encounter: Payer: Self-pay | Admitting: Internal Medicine

## 2016-02-17 VITALS — BP 102/67 | HR 84 | Resp 20

## 2016-02-17 VITALS — BP 97/66 | HR 88 | Temp 97.6°F | Resp 18 | Ht 64.0 in | Wt 231.5 lb

## 2016-02-17 DIAGNOSIS — Z794 Long term (current) use of insulin: Secondary | ICD-10-CM | POA: Insufficient documentation

## 2016-02-17 DIAGNOSIS — C3412 Malignant neoplasm of upper lobe, left bronchus or lung: Secondary | ICD-10-CM

## 2016-02-17 DIAGNOSIS — R439 Unspecified disturbances of smell and taste: Secondary | ICD-10-CM | POA: Diagnosis not present

## 2016-02-17 DIAGNOSIS — D696 Thrombocytopenia, unspecified: Secondary | ICD-10-CM | POA: Insufficient documentation

## 2016-02-17 DIAGNOSIS — F17210 Nicotine dependence, cigarettes, uncomplicated: Secondary | ICD-10-CM | POA: Insufficient documentation

## 2016-02-17 DIAGNOSIS — G629 Polyneuropathy, unspecified: Secondary | ICD-10-CM

## 2016-02-17 DIAGNOSIS — I10 Essential (primary) hypertension: Secondary | ICD-10-CM | POA: Insufficient documentation

## 2016-02-17 DIAGNOSIS — R110 Nausea: Secondary | ICD-10-CM | POA: Insufficient documentation

## 2016-02-17 DIAGNOSIS — R0789 Other chest pain: Secondary | ICD-10-CM

## 2016-02-17 DIAGNOSIS — E1165 Type 2 diabetes mellitus with hyperglycemia: Secondary | ICD-10-CM | POA: Insufficient documentation

## 2016-02-17 DIAGNOSIS — M1990 Unspecified osteoarthritis, unspecified site: Secondary | ICD-10-CM | POA: Insufficient documentation

## 2016-02-17 DIAGNOSIS — D72819 Decreased white blood cell count, unspecified: Secondary | ICD-10-CM | POA: Insufficient documentation

## 2016-02-17 DIAGNOSIS — Z79899 Other long term (current) drug therapy: Secondary | ICD-10-CM

## 2016-02-17 DIAGNOSIS — Z7984 Long term (current) use of oral hypoglycemic drugs: Secondary | ICD-10-CM | POA: Insufficient documentation

## 2016-02-17 DIAGNOSIS — R1310 Dysphagia, unspecified: Secondary | ICD-10-CM

## 2016-02-17 DIAGNOSIS — R918 Other nonspecific abnormal finding of lung field: Secondary | ICD-10-CM

## 2016-02-17 DIAGNOSIS — Z5111 Encounter for antineoplastic chemotherapy: Secondary | ICD-10-CM | POA: Insufficient documentation

## 2016-02-17 DIAGNOSIS — D649 Anemia, unspecified: Secondary | ICD-10-CM | POA: Insufficient documentation

## 2016-02-17 DIAGNOSIS — E7800 Pure hypercholesterolemia, unspecified: Secondary | ICD-10-CM | POA: Insufficient documentation

## 2016-02-17 LAB — COMPREHENSIVE METABOLIC PANEL
ALT: 16 U/L (ref 14–54)
ANION GAP: 7 (ref 5–15)
AST: 18 U/L (ref 15–41)
Albumin: 3.5 g/dL (ref 3.5–5.0)
Alkaline Phosphatase: 74 U/L (ref 38–126)
BUN: 11 mg/dL (ref 6–20)
CHLORIDE: 103 mmol/L (ref 101–111)
CO2: 25 mmol/L (ref 22–32)
Calcium: 8.9 mg/dL (ref 8.9–10.3)
Creatinine, Ser: 0.78 mg/dL (ref 0.44–1.00)
GFR calc Af Amer: >60 mL/min (ref >60)
Glucose, Bld: 202 mg/dL — ABNORMAL HIGH (ref 65–99)
POTASSIUM: 3.8 mmol/L (ref 3.5–5.1)
Sodium: 135 mmol/L (ref 135–145)
Total Bilirubin: 0.6 mg/dL (ref 0.3–1.2)
Total Protein: 7 g/dL (ref 6.5–8.1)

## 2016-02-17 LAB — CBC WITH DIFFERENTIAL/PLATELET
BASOS ABS: 0 10*3/uL (ref 0–0.1)
BASOS PCT: 1 %
EOS PCT: 1 %
Eosinophils Absolute: 0 10*3/uL (ref 0–0.7)
HCT: 28.3 % — ABNORMAL LOW (ref 35.0–47.0)
Hemoglobin: 9.6 g/dL — ABNORMAL LOW (ref 12.0–16.0)
Lymphocytes Relative: 25 %
Lymphs Abs: 0.7 10*3/uL — ABNORMAL LOW (ref 1.0–3.6)
MCH: 29.1 pg (ref 26.0–34.0)
MCHC: 34 g/dL (ref 32.0–36.0)
MCV: 85.7 fL (ref 80.0–100.0)
MONO ABS: 0.3 10*3/uL (ref 0.2–0.9)
MONOS PCT: 12 %
Neutro Abs: 1.7 10*3/uL (ref 1.4–6.5)
Neutrophils Relative %: 61 %
PLATELETS: 102 10*3/uL — AB (ref 150–440)
RBC: 3.3 MIL/uL — ABNORMAL LOW (ref 3.80–5.20)
RDW: 17.7 % — AB (ref 11.5–14.5)
WBC: 2.7 10*3/uL — ABNORMAL LOW (ref 3.6–11.0)

## 2016-02-17 MED ORDER — PROCHLORPERAZINE MALEATE 10 MG PO TABS: 10.0000 mg | ORAL_TABLET | Freq: Four times a day (QID) | ORAL | Status: DC | PRN

## 2016-02-17 MED ORDER — ONDANSETRON HCL 8 MG PO TABS: 8.0000 mg | ORAL_TABLET | Freq: Two times a day (BID) | ORAL | Status: DC

## 2016-02-17 MED ORDER — SODIUM CHLORIDE 0.9 % IV SOLN
Freq: Once | INTRAVENOUS | Status: AC
  Administered 2016-02-17: 10:00:00 via INTRAVENOUS
  Filled 2016-02-17: qty 1000

## 2016-02-17 MED ORDER — LIDOCAINE-PRILOCAINE 2.5-2.5 % EX CREA: TOPICAL_CREAM | CUTANEOUS | Status: DC

## 2016-02-17 MED ORDER — FAMOTIDINE IN NACL 20-0.9 MG/50ML-% IV SOLN
20.0000 mg | Freq: Once | INTRAVENOUS | Status: AC
  Administered 2016-02-17: 20 mg via INTRAVENOUS
  Filled 2016-02-17: qty 50

## 2016-02-17 MED ORDER — HEPARIN SOD (PORK) LOCK FLUSH 100 UNIT/ML IV SOLN
500.0000 [IU] | Freq: Once | INTRAVENOUS | Status: AC | PRN
  Administered 2016-02-17: 500 [IU]
  Filled 2016-02-17: qty 5

## 2016-02-17 MED ORDER — SODIUM CHLORIDE 0.9 % IV SOLN
Freq: Once | INTRAVENOUS | Status: AC
  Administered 2016-02-17: 10:00:00 via INTRAVENOUS
  Filled 2016-02-17: qty 8

## 2016-02-17 MED ORDER — FENTANYL 25 MCG/HR TD PT72: 25.0000 ug | MEDICATED_PATCH | TRANSDERMAL | Status: DC

## 2016-02-17 MED ORDER — DIPHENHYDRAMINE HCL 50 MG/ML IJ SOLN
25.0000 mg | Freq: Once | INTRAMUSCULAR | Status: AC
  Administered 2016-02-17: 25 mg via INTRAVENOUS
  Filled 2016-02-17: qty 1

## 2016-02-17 MED ORDER — SODIUM CHLORIDE 0.9 % IV SOLN
240.6000 mg | Freq: Once | INTRAVENOUS | Status: AC
  Administered 2016-02-17: 240 mg via INTRAVENOUS
  Filled 2016-02-17: qty 24

## 2016-02-17 MED ORDER — PACLITAXEL CHEMO INJECTION 300 MG/50ML
45.0000 mg/m2 | Freq: Once | INTRAVENOUS | Status: AC
  Administered 2016-02-17: 102 mg via INTRAVENOUS
  Filled 2016-02-17: qty 17

## 2016-02-17 NOTE — Progress Notes (Signed)
[place] OFFICE PROGRESS NOTE  Patient Care Team: Marguerita Merles, MD as PCP - General (Family Medicine)   SUMMARY OF ONCOLOGIC HISTORY:  # STAGE III A SQUAMOUS CELL CA of Lung- carbo-taxol with RT  INTERVAL HISTORY:  65 year old female patient with a history of stage III squamous cell carcinoma of the lung is currently on concurrent chemotherapy with Taxol and carboplatin along with radiation.  Patient has chronic pain in her left chest area on fentanyl patch. This is not any worse. Denies any unusual cough or hemoptysis. Denies any worsening shortness of breath.  She complains of poor taste sensation. She has occasional difficult to swallowing; currently on Carafate. No significant weight loss. No significant nausea no vomiting. She has been using antiemetics as needed  REVIEW OF SYSTEMS:  A complete 10 point review of system is done which is negative except mentioned above/history of present illness.   PAST MEDICAL HISTORY :  Past Medical History  Diagnosis Date  . Arthritis   . Diabetes mellitus without complication ([place])   . Hypertension   . High cholesterol   . Blood dyscrasia   . Diabetic neuropathy ([place])   . Lung cancer ([place])     PAST SURGICAL HISTORY :   Past Surgical History  Procedure Laterality Date  . Abdominal hysterectomy    . Cesarean section    . Endobronchial ultrasound N/A 11/26/2015    Procedure: ENDOBRONCHIAL ULTRASOUND;  Surgeon: Flora Lipps, MD;  Location: [facility];  Service: Cardiopulmonary;  Laterality: N/A;  . Electromagnetic navigation brochoscopy N/A 11/26/2015    Procedure: ELECTROMAGNETIC NAVIGATION BRONCHOSCOPY;  Surgeon: Flora Lipps, MD;  Location: [facility];  Service: Cardiopulmonary;  Laterality: N/A;  . Peripheral vascular catheterization N/A 12/09/2015    Procedure: Glori Luis Cath Insertion;  Surgeon: Katha Cabal, MD;  Location: [facility];  Service: Cardiovascular;  Laterality: N/A;    FAMILY HISTORY :  No  family history on file.  SOCIAL HISTORY:   Social History  Substance Use Topics  . Smoking status: Current Every Day Smoker -- 2.00 packs/day for 35 years    Types: Cigarettes  . Smokeless tobacco: Never Used     Comment: currently smoking 1 pk per day  . Alcohol Use: No    ALLERGIES:  has No Known Allergies.  MEDICATIONS:  Current Outpatient Prescriptions  Medication Sig Dispense Refill  . atorvastatin (LIPITOR) 20 MG tablet Take 20 mg by mouth daily at 6 PM.    . insulin glargine (LANTUS) 100 UNIT/ML injection Inject 60 Units into the skin at bedtime.    . lidocaine-prilocaine (EMLA) cream Apply to affected area once 30 g 6  . linagliptin (TRADJENTA) 5 MG TABS tablet Take 5 mg by mouth daily.     Marland Kitchen lisinopril (PRINIVIL,ZESTRIL) 10 MG tablet Take 10 mg by mouth daily.    . metFORMIN (GLUMETZA) 500 MG (MOD) 24 hr tablet Take 1,000 mg by mouth 2 (two) times daily with a meal. Reported on 12/23/2015    . ondansetron (ZOFRAN) 8 MG tablet Take 1 tablet (8 mg total) by mouth 2 (two) times daily. Start the day after chemo for 3 days. Then take as needed for nausea or vomiting. 30 tablet 1  . Oxycodone HCl 10 MG TABS Take 1 tablet (10 mg total) by mouth every 6 (six) hours as needed. 60 tablet 0  . prochlorperazine (COMPAZINE) 10 MG tablet Take 1 tablet (10 mg total) by mouth every 6 (six) hours as needed (Nausea  or vomiting). 30 tablet 1  . sucralfate (CARAFATE) 1 g tablet Take 1 tablet (1 g total) by mouth 3 (three) times daily. Dissolve each tablet in 2-3 tbsp of warm water, swish and swallow. 90 tablet 3  . fentaNYL (DURAGESIC - DOSED MCG/HR) 25 MCG/HR patch Place 1 patch (25 mcg total) onto the skin every 3 (three) days. 5 patch 0   No current facility-administered medications for this visit.    PHYSICAL EXAMINATION: ECOG PERFORMANCE STATUS: 0 - Asymptomatic  BP 97/66 mmHg  Pulse 88  Temp(Src) 97.6 F (36.4 C) (Tympanic)  Resp 18  Ht '5\' 4"'$  (1.626 m)  Wt 231 lb 7.7 oz (105 kg)   BMI 39.71 kg/m2  SpO2 100%  Filed Weights   02/17/16 0900  Weight: 231 lb 7.7 oz (105 kg)    GENERAL: Well-nourished well-developed; Alert, no distress and comfortable.   Alone.  EYES: no pallor or icterus OROPHARYNX: no thrush or ulceration; good dentition  NECK: supple, no masses felt LYMPH:  no palpable lymphadenopathy in the cervical, axillary or inguinal regions LUNGS: clear to auscultation and  No wheeze or crackles HEART/CVS: regular rate & rhythm and no murmurs; No lower extremity edema ABDOMEN:abdomen soft, non-tender and normal bowel sounds Musculoskeletal:no cyanosis of digits and no clubbing  PSYCH: alert & oriented x 3 with fluent speech NEURO: no focal motor/sensory deficits SKIN:  no rashes or significant lesions  LABORATORY DATA:  I have reviewed the data as listed    Component Value Date/Time   NA 135 02/17/2016 0825   NA 143 08/21/2012 1022   K 3.8 02/17/2016 0825   K 3.5 08/21/2012 1022   CL 103 02/17/2016 0825   CL 110* 08/21/2012 1022   CO2 25 02/17/2016 0825   CO2 27 08/21/2012 1022   GLUCOSE 202* 02/17/2016 0825   GLUCOSE 175* 08/21/2012 1022   BUN 11 02/17/2016 0825   BUN 7 08/21/2012 1022   CREATININE 0.78 02/17/2016 0825   CREATININE 0.59* 08/21/2012 1022   CALCIUM 8.9 02/17/2016 0825   CALCIUM 9.2 08/21/2012 1022   PROT 7.0 02/17/2016 0825   ALBUMIN 3.5 02/17/2016 0825   AST 18 02/17/2016 0825   ALT 16 02/17/2016 0825   ALKPHOS 74 02/17/2016 0825   BILITOT 0.6 02/17/2016 0825   GFRNONAA >60 02/17/2016 0825   GFRNONAA >60 08/21/2012 1022   GFRAA >60 02/17/2016 0825   GFRAA >60 08/21/2012 1022    No results found for: SPEP, UPEP  Lab Results  Component Value Date   WBC 2.7* 02/17/2016   NEUTROABS 1.7 02/17/2016   HGB 9.6* 02/17/2016   HCT 28.3* 02/17/2016   MCV 85.7 02/17/2016   PLT 102* 02/17/2016      Chemistry      Component Value Date/Time   NA 135 02/17/2016 0825   NA 143 08/21/2012 1022   K 3.8 02/17/2016 0825   K  3.5 08/21/2012 1022   CL 103 02/17/2016 0825   CL 110* 08/21/2012 1022   CO2 25 02/17/2016 0825   CO2 27 08/21/2012 1022   BUN 11 02/17/2016 0825   BUN 7 08/21/2012 1022   CREATININE 0.78 02/17/2016 0825   CREATININE 0.59* 08/21/2012 1022      Component Value Date/Time   CALCIUM 8.9 02/17/2016 0825   CALCIUM 9.2 08/21/2012 1022   ALKPHOS 74 02/17/2016 0825   AST 18 02/17/2016 0825   ALT 16 02/17/2016 0825   BILITOT 0.6 02/17/2016 0825  ASSESSMENT & PLAN:   # STAGE III A SQUAMOUS CELL CA of Lung currently on concurrent chemotherapy with Taxol and carboplatin along with radiation s/p 6 weeks. CBC shows white count- 2.7 ANC 1.7; hemoglobin 9.6 platelets 102. Proceed with carbotaxol with radiation today.  # Chest wall pain from tumor- continue fentanyl patch. Prescription given.  # Poor taste secondary to chemotherapy- proceed with dietary consult.  # Neuropathy question related to diabetes the upper extremities not any worse.    # Patient follow-up with Dr. Grayland Ormond in 1 week labs/chemotherapy with continued radiation.  # 25 minutes face-to-face with the patient discussing the above plan of care; more than 50% of time spent on prognosis/ natural history; counseling and coordination.     Cammie Sickle, MD 02/17/2016 9:30 AM

## 2016-02-17 NOTE — Progress Notes (Signed)
Patient being seen by Dr. Rogue Bussing today. 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 She reports intermittent episodes of constipation. She states that "I've only drink soft drinks as this is what her appeals to me." Patient complains of dysphagia with liquids specifically when drinking water, decreased sensation taste and smell. "I have trouble cooking my own foods without smells bothering me. Coffee bothers me. I can not stand the tastes of it anymore. I am using the carafate to help with my swallowing. I think this works very well with the dysphagia.  Pt requesting dietary referral discuss tips for improving taste. She reports that since using the carafate consistently that her appetite has improved since the last visit.   I need a RF on EMLA cream, zofran, compazine and fentanyl patches. I ran out of my fentanyl and have not renewed this.  Pt provided with script for fentanyl patch by Dr. Rogue Bussing. EMLA, Zofran was e-scribed and the compazine was verbally called into [facility].  She also reports numbness and tingling in hands/feet. "I occasionally drop objects, however, I had neuropathy symptoms before starting chemotherapy. I am a diabetic."

## 2016-02-18 ENCOUNTER — Ambulatory Visit
Admission: RE | Admit: 2016-02-18 | Discharge: 2016-02-18 | Disposition: A | Discharge status: Home / Self Care | Payer: PPO | Source: Ambulatory Visit | Attending: Radiation Oncology | Admitting: Radiation Oncology

## 2016-02-18 DIAGNOSIS — C3412 Malignant neoplasm of upper lobe, left bronchus or lung: Secondary | ICD-10-CM | POA: Diagnosis not present

## 2016-02-18 DIAGNOSIS — Z510 Encounter for antineoplastic radiation therapy: Secondary | ICD-10-CM | POA: Diagnosis not present

## 2016-02-22 ENCOUNTER — Ambulatory Visit
Admission: RE | Admit: 2016-02-22 | Discharge: 2016-02-22 | Disposition: A | Discharge status: Home / Self Care | Payer: PPO | Source: Ambulatory Visit | Attending: Radiation Oncology | Admitting: Radiation Oncology

## 2016-02-22 DIAGNOSIS — C3412 Malignant neoplasm of upper lobe, left bronchus or lung: Secondary | ICD-10-CM | POA: Diagnosis not present

## 2016-02-22 DIAGNOSIS — F17210 Nicotine dependence, cigarettes, uncomplicated: Secondary | ICD-10-CM | POA: Diagnosis not present

## 2016-02-22 DIAGNOSIS — Z510 Encounter for antineoplastic radiation therapy: Secondary | ICD-10-CM | POA: Diagnosis not present

## 2016-02-23 ENCOUNTER — Ambulatory Visit
Admission: RE | Admit: 2016-02-23 | Discharge: 2016-02-23 | Disposition: A | Discharge status: Home / Self Care | Payer: PPO | Source: Ambulatory Visit | Attending: Radiation Oncology | Admitting: Radiation Oncology

## 2016-02-23 DIAGNOSIS — Z510 Encounter for antineoplastic radiation therapy: Secondary | ICD-10-CM | POA: Diagnosis not present

## 2016-02-24 ENCOUNTER — Ambulatory Visit
Admission: RE | Admit: 2016-02-24 | Discharge: 2016-02-24 | Disposition: A | Discharge status: Home / Self Care | Payer: PPO | Source: Ambulatory Visit | Attending: Radiation Oncology | Admitting: Radiation Oncology

## 2016-02-24 ENCOUNTER — Inpatient Hospital Stay (HOSPITAL_BASED_OUTPATIENT_CLINIC_OR_DEPARTMENT_OTHER): Payer: PPO | Admitting: Oncology

## 2016-02-24 ENCOUNTER — Inpatient Hospital Stay: Payer: PPO

## 2016-02-24 VITALS — BP 109/76 | HR 99 | Temp 95.7°F | Resp 18 | Ht 64.0 in | Wt 226.6 lb

## 2016-02-24 DIAGNOSIS — R110 Nausea: Secondary | ICD-10-CM

## 2016-02-24 DIAGNOSIS — C3412 Malignant neoplasm of upper lobe, left bronchus or lung: Secondary | ICD-10-CM | POA: Diagnosis not present

## 2016-02-24 DIAGNOSIS — F17210 Nicotine dependence, cigarettes, uncomplicated: Secondary | ICD-10-CM

## 2016-02-24 DIAGNOSIS — G629 Polyneuropathy, unspecified: Secondary | ICD-10-CM

## 2016-02-24 DIAGNOSIS — Z79899 Other long term (current) drug therapy: Secondary | ICD-10-CM

## 2016-02-24 DIAGNOSIS — D649 Anemia, unspecified: Secondary | ICD-10-CM

## 2016-02-24 DIAGNOSIS — D72819 Decreased white blood cell count, unspecified: Secondary | ICD-10-CM

## 2016-02-24 DIAGNOSIS — Z5111 Encounter for antineoplastic chemotherapy: Secondary | ICD-10-CM | POA: Diagnosis not present

## 2016-02-24 DIAGNOSIS — Z510 Encounter for antineoplastic radiation therapy: Secondary | ICD-10-CM | POA: Diagnosis not present

## 2016-02-24 LAB — CBC WITH DIFFERENTIAL/PLATELET
BASOS ABS: 0 10*3/uL (ref 0–0.1)
BASOS PCT: 1 %
Eosinophils Absolute: 0 10*3/uL (ref 0–0.7)
Eosinophils Relative: 1 %
HEMATOCRIT: 28.7 % — AB (ref 35.0–47.0)
HEMOGLOBIN: 9.8 g/dL — AB (ref 12.0–16.0)
LYMPHS PCT: 30 %
Lymphs Abs: 0.7 10*3/uL — ABNORMAL LOW (ref 1.0–3.6)
MCH: 29.4 pg (ref 26.0–34.0)
MCHC: 34.2 g/dL (ref 32.0–36.0)
MCV: 86 fL (ref 80.0–100.0)
MONOS PCT: 10 %
Monocytes Absolute: 0.2 10*3/uL (ref 0.2–0.9)
NEUTROS ABS: 1.3 10*3/uL — AB (ref 1.4–6.5)
NEUTROS PCT: 58 %
Platelets: 133 10*3/uL — ABNORMAL LOW (ref 150–440)
RBC: 3.34 MIL/uL — ABNORMAL LOW (ref 3.80–5.20)
RDW: 18.6 % — ABNORMAL HIGH (ref 11.5–14.5)
WBC: 2.3 10*3/uL — ABNORMAL LOW (ref 3.6–11.0)

## 2016-02-24 LAB — COMPREHENSIVE METABOLIC PANEL
ALBUMIN: 3.9 g/dL (ref 3.5–5.0)
ALK PHOS: 78 U/L (ref 38–126)
ALT: 20 U/L (ref 14–54)
AST: 23 U/L (ref 15–41)
Anion gap: 6 (ref 5–15)
BILIRUBIN TOTAL: 0.5 mg/dL (ref 0.3–1.2)
BUN: 16 mg/dL (ref 6–20)
CO2: 24 mmol/L (ref 22–32)
Calcium: 9.1 mg/dL (ref 8.9–10.3)
Chloride: 108 mmol/L (ref 101–111)
Creatinine, Ser: 0.85 mg/dL (ref 0.44–1.00)
GFR calc Af Amer: >60 mL/min (ref >60)
GLUCOSE: 173 mg/dL — AB (ref 65–99)
Potassium: 4.6 mmol/L (ref 3.5–5.1)
Sodium: 138 mmol/L (ref 135–145)
Total Protein: 6.9 g/dL (ref 6.5–8.1)

## 2016-02-24 MED ORDER — SODIUM CHLORIDE 0.9 % IV SOLN
Freq: Once | INTRAVENOUS | Status: AC
Start: 2016-02-24 — End: 2016-02-24
  Administered 2016-02-24: 10:00:00 via INTRAVENOUS
  Filled 2016-02-24: qty 1000

## 2016-02-24 MED ORDER — SODIUM CHLORIDE 0.9 % IV SOLN
Freq: Once | INTRAVENOUS | Status: AC
  Administered 2016-02-24: 11:00:00 via INTRAVENOUS
  Filled 2016-02-24: qty 8

## 2016-02-24 MED ORDER — FAMOTIDINE IN NACL 20-0.9 MG/50ML-% IV SOLN
20.0000 mg | Freq: Once | INTRAVENOUS | Status: AC
  Administered 2016-02-24: 20 mg via INTRAVENOUS
  Filled 2016-02-24: qty 50

## 2016-02-24 MED ORDER — DIPHENHYDRAMINE HCL 50 MG/ML IJ SOLN
25.0000 mg | Freq: Once | INTRAMUSCULAR | Status: AC
  Administered 2016-02-24: 25 mg via INTRAVENOUS
  Filled 2016-02-24: qty 1

## 2016-02-24 MED ORDER — PROMETHAZINE HCL 25 MG PO TABS: 25.0000 mg | ORAL_TABLET | Freq: Four times a day (QID) | ORAL | Status: DC | PRN

## 2016-02-24 MED ORDER — SODIUM CHLORIDE 0.9% FLUSH
10.0000 mL | INTRAVENOUS | Status: DC | PRN
  Filled 2016-02-24: qty 10

## 2016-02-24 MED ORDER — SODIUM CHLORIDE 0.9 % IV SOLN
240.6000 mg | Freq: Once | INTRAVENOUS | Status: AC
  Administered 2016-02-24: 240 mg via INTRAVENOUS
  Filled 2016-02-24: qty 24

## 2016-02-24 MED ORDER — PACLITAXEL CHEMO INJECTION 300 MG/50ML
45.0000 mg/m2 | Freq: Once | INTRAVENOUS | Status: AC
  Administered 2016-02-24: 102 mg via INTRAVENOUS
  Filled 2016-02-24: qty 17

## 2016-02-24 MED ORDER — HEPARIN SOD (PORK) LOCK FLUSH 100 UNIT/ML IV SOLN
500.0000 [IU] | Freq: Once | INTRAVENOUS | Status: AC
  Administered 2016-02-24: 500 [IU] via INTRAVENOUS
  Filled 2016-02-24: qty 5

## 2016-02-24 MED ORDER — OXYCODONE HCL 10 MG PO TABS: 10.0000 mg | ORAL_TABLET | Freq: Four times a day (QID) | ORAL | Status: DC | PRN

## 2016-02-24 NOTE — Progress Notes (Signed)
[facility]  [phone] [phone]  ID: [name] OB: 01-12-1951  MR#: [id number]  MLY#:[id number]  Patient Care Team: Marguerita Merles, MD as PCP - General (Family Medicine)  CHIEF COMPLAINT: Lung mass.  INTERVAL HISTORY: Patient returns to clinic today for further evaluation and cycle 8 of weekly carboplatinum and Taxol along with daily XRT. She does not complain of left side chest pain or shortness of breath today. She is taking carafate which she thinks is helping her with swallowing.  She still complains of fatigue. She is having a lot of nausea. She states that the ondansetron makes her more sick when she takes it but she also takes compazine which does help. She has been noticing tingling in her right foot. She does still have neuropathy in her fingers and toes but it does not interfere with her daily life. She does still struggle with altered taste. She has been drinking V8. She has a 5 pound weight loss this week. She has no other neurologic complaints. She denies any recent fevers or illnesses. She has a fair appetite and denies weight loss. She denies cough, or hemoptysis.  She has no nausea, vomiting, diarrhea or constipation.  She has no urinary complaints. Patient offers no further specific complaints today.   REVIEW OF SYSTEMS:   Review of Systems  Constitutional: Positive for weight loss and malaise/fatigue. Negative for fever.  HENT: Negative.   Respiratory: Negative for cough, hemoptysis and shortness of breath.   Cardiovascular: Negative.  Negative for leg swelling.  Gastrointestinal: Positive for nausea.  Genitourinary: Negative.   Musculoskeletal: Negative.   Neurological: Positive for sensory change. Negative for weakness.    As per HPI. Otherwise, a complete review of systems is negatve.  PAST MEDICAL HISTORY: Past Medical History  Diagnosis Date  . Arthritis   . Diabetes mellitus without complication ([place])   .  Hypertension   . High cholesterol   . Blood dyscrasia   . Diabetic neuropathy ([place])   . Lung cancer ([place])     PAST SURGICAL HISTORY: Past Surgical History  Procedure Laterality Date  . Abdominal hysterectomy    . Cesarean section    . Endobronchial ultrasound N/A 11/26/2015    Procedure: ENDOBRONCHIAL ULTRASOUND;  Surgeon: Flora Lipps, MD;  Location: [facility];  Service: Cardiopulmonary;  Laterality: N/A;  . Electromagnetic navigation brochoscopy N/A 11/26/2015    Procedure: ELECTROMAGNETIC NAVIGATION BRONCHOSCOPY;  Surgeon: Flora Lipps, MD;  Location: [facility];  Service: Cardiopulmonary;  Laterality: N/A;  . Peripheral vascular catheterization N/A 12/09/2015    Procedure: Glori Luis Cath Insertion;  Surgeon: Katha Cabal, MD;  Location: [facility];  Service: Cardiovascular;  Laterality: N/A;    FAMILY HISTORY: Reviewed and unchanged. No reported history of malignancy or chronic disease.     ADVANCED DIRECTIVES:    HEALTH MAINTENANCE: Social History  Substance Use Topics  . Smoking status: Current Every Day Smoker -- 2.00 packs/day for 35 years    Types: Cigarettes  . Smokeless tobacco: Never Used     Comment: currently smoking 1 pk per day  . Alcohol Use: No     No Known Allergies  Current Outpatient Prescriptions  Medication Sig Dispense Refill  . atorvastatin (LIPITOR) 20 MG tablet Take 20 mg by mouth daily at 6 PM.    . fentaNYL (DURAGESIC - DOSED MCG/HR) 25 MCG/HR patch Place 1 patch (25 mcg total) onto the skin every 3 (three) days. 5 patch 0  . insulin  glargine (LANTUS) 100 UNIT/ML injection Inject 60 Units into the skin at bedtime.    . lidocaine-prilocaine (EMLA) cream Apply to affected area once 30 g 6  . linagliptin (TRADJENTA) 5 MG TABS tablet Take 5 mg by mouth daily.     Marland Kitchen lisinopril (PRINIVIL,ZESTRIL) 10 MG tablet Take 10 mg by mouth daily.    . metFORMIN (GLUMETZA) 500 MG (MOD) 24 hr tablet Take 1,000 mg by mouth 2 (two) times daily with a meal.  Reported on 12/23/2015    . ondansetron (ZOFRAN) 8 MG tablet Take 1 tablet (8 mg total) by mouth 2 (two) times daily. Start the day after chemo for 3 days. Then take as needed for nausea or vomiting. 30 tablet 3  . Oxycodone HCl 10 MG TABS Take 1 tablet (10 mg total) by mouth every 6 (six) hours as needed. 60 tablet 0  . prochlorperazine (COMPAZINE) 10 MG tablet Take 1 tablet (10 mg total) by mouth every 6 (six) hours as needed for nausea, vomiting or refractory nausea / vomiting (Nausea or vomiting). 30 tablet 3  . sucralfate (CARAFATE) 1 g tablet Take 1 tablet (1 g total) by mouth 3 (three) times daily. Dissolve each tablet in 2-3 tbsp of warm water, swish and swallow. 90 tablet 3  . promethazine (PHENERGAN) 25 MG tablet Take 1 tablet (25 mg total) by mouth every 6 (six) hours as needed for nausea or vomiting. 30 tablet 0   No current facility-administered medications for this visit.   Facility-Administered Medications Ordered in Other Visits  Medication Dose Route Frequency Provider Last Rate Last Dose  . CARBOplatin (PARAPLATIN) 240 mg in sodium chloride 0.9 % 100 mL chemo infusion  240 mg Intravenous Once [name], MD      . ondansetron (ZOFRAN) 16 mg, dexamethasone (DECADRON) 10 mg in sodium chloride 0.9 % 50 mL IVPB   Intravenous Once [name], MD      . PACLitaxel (TAXOL) 102 mg in dextrose 5 % 250 mL chemo infusion (</= '80mg'$ /m2)  45 mg/m2 (Treatment Plan Actual) Intravenous Once [name], MD        OBJECTIVE: Filed Vitals:   02/24/16 0945  BP: 109/76  Pulse: 99  Temp: 95.7 F (35.4 C)  Resp: 18     Body mass index is 38.88 kg/(m^2).    ECOG FS:0 - Asymptomatic  General: Well-developed, well-nourished, no acute distress. Eyes: Pink conjunctiva, anicteric sclera. Lungs: Clear to auscultation bilaterally. Heart: Regular rate and rhythm. No rubs, murmurs, or gallops. Abdomen: Soft, nontender, nondistended. No organomegaly noted, normoactive bowel  sounds. Musculoskeletal: No edema, cyanosis, or clubbing. Neuro: Alert, answering all questions appropriately. Cranial nerves grossly intact. Skin: No rashes or petechiae noted. Psych: Normal affect.   LAB RESULTS:  Lab Results  Component Value Date   NA 138 02/24/2016   K 4.6 02/24/2016   CL 108 02/24/2016   CO2 24 02/24/2016   GLUCOSE 173* 02/24/2016   BUN 16 02/24/2016   CREATININE 0.85 02/24/2016   CALCIUM 9.1 02/24/2016   PROT 6.9 02/24/2016   ALBUMIN 3.9 02/24/2016   AST 23 02/24/2016   ALT 20 02/24/2016   ALKPHOS 78 02/24/2016   BILITOT 0.5 02/24/2016   GFRNONAA >60 02/24/2016   GFRAA >60 02/24/2016    Lab Results  Component Value Date   WBC 2.3* 02/24/2016   NEUTROABS 1.3* 02/24/2016   HGB 9.8* 02/24/2016   HCT 28.7* 02/24/2016   MCV 86.0 02/24/2016   PLT 133* 02/24/2016  STUDIES: No results found.  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   PLAN:    1. Lung mass: Currently on chemotherapy with Taxol and carboplatinum along with daily XRT. Proceed with cycle 8 of weekly carboplatinum and Taxol today. Her last XRT is scheduled for March 04, 2016.   Return to clinic in 1 week for continuation of concurrent chemotherapy and XRT. At the conclusion of XRT, patient will have 3-4 weeks off and return to clinic for 2 consolidation doses of carboplatinum and Taxol.  Will reimage 6-8 weeks after conclusion of consolidation chemotherapy.  2. Nosebleed: Resolved. 3. Hypotension: 109/76 today. Monitor. 4. Shortness of breath: Resolved.  5. Hyperglycemia: Continue diabetic medications as prescribed. Monitor closely since patient is receiving dexamethasone as a premedication. 6. Anemia: HGB 9.8 today. monitor. 7. Swallowing difficulty: Resolved. Continue carafate. Offered dukes mouthwash but patient refuses at this time.  8. Leukopenia: WBC 2.3 and ANC 1300 today. Secondary to chemotherapy. Monitor.  Proceed with treatment as above. 9.  Thrombocytopenia: Platelets 133 today. Monitor. 10. Altered taste: Nutritionist scheduled for April 1st.  11. Nausea: Phenergan sent to pharmacy.  Patient expressed understanding and was in agreement with this plan. She also understands that She can call clinic at any time with any questions, concerns, or complaints.   Mayra Reel, NP   02/24/2016 10:56 AM   Patient was seen and evaluated independently and I agree with the assessment and plan as dictated above.  [name], MD 02/26/2016 12:03 AM

## 2016-02-24 NOTE — Progress Notes (Signed)
Pt reports that Zofran ,akes her dizzy and does not help nausea.  Reports smoking less

## 2016-02-25 ENCOUNTER — Ambulatory Visit
Admission: RE | Admit: 2016-02-25 | Discharge: 2016-02-25 | Disposition: A | Discharge status: Home / Self Care | Payer: PPO | Source: Ambulatory Visit | Attending: Radiation Oncology | Admitting: Radiation Oncology

## 2016-02-25 DIAGNOSIS — Z510 Encounter for antineoplastic radiation therapy: Secondary | ICD-10-CM | POA: Diagnosis not present

## 2016-02-26 ENCOUNTER — Ambulatory Visit
Admission: RE | Admit: 2016-02-26 | Discharge: 2016-02-26 | Disposition: A | Discharge status: Home / Self Care | Payer: PPO | Source: Ambulatory Visit | Attending: Radiation Oncology | Admitting: Radiation Oncology

## 2016-02-26 DIAGNOSIS — Z510 Encounter for antineoplastic radiation therapy: Secondary | ICD-10-CM | POA: Diagnosis not present

## 2016-02-29 ENCOUNTER — Ambulatory Visit
Admission: RE | Admit: 2016-02-29 | Discharge: 2016-02-29 | Disposition: A | Discharge status: Home / Self Care | Payer: PPO | Source: Ambulatory Visit | Attending: Radiation Oncology | Admitting: Radiation Oncology

## 2016-02-29 ENCOUNTER — Ambulatory Visit: Payer: PPO

## 2016-03-01 ENCOUNTER — Ambulatory Visit
Admission: RE | Admit: 2016-03-01 | Discharge: 2016-03-01 | Disposition: A | Discharge status: Home / Self Care | Payer: PPO | Source: Ambulatory Visit | Attending: Radiation Oncology | Admitting: Radiation Oncology

## 2016-03-01 DIAGNOSIS — C3412 Malignant neoplasm of upper lobe, left bronchus or lung: Secondary | ICD-10-CM | POA: Diagnosis not present

## 2016-03-01 DIAGNOSIS — Z510 Encounter for antineoplastic radiation therapy: Secondary | ICD-10-CM | POA: Diagnosis not present

## 2016-03-01 DIAGNOSIS — Z139 Encounter for screening, unspecified: Secondary | ICD-10-CM | POA: Diagnosis not present

## 2016-03-01 DIAGNOSIS — F17210 Nicotine dependence, cigarettes, uncomplicated: Secondary | ICD-10-CM | POA: Diagnosis not present

## 2016-03-01 DIAGNOSIS — Z1389 Encounter for screening for other disorder: Secondary | ICD-10-CM | POA: Diagnosis not present

## 2016-03-02 ENCOUNTER — Inpatient Hospital Stay: Payer: PPO

## 2016-03-02 ENCOUNTER — Ambulatory Visit
Admission: RE | Admit: 2016-03-02 | Discharge: 2016-03-02 | Disposition: A | Discharge status: Home / Self Care | Payer: PPO | Source: Ambulatory Visit | Attending: Radiation Oncology | Admitting: Radiation Oncology

## 2016-03-02 ENCOUNTER — Other Ambulatory Visit: Payer: PPO

## 2016-03-02 ENCOUNTER — Other Ambulatory Visit: Payer: Self-pay | Admitting: Family Medicine

## 2016-03-02 ENCOUNTER — Inpatient Hospital Stay (HOSPITAL_BASED_OUTPATIENT_CLINIC_OR_DEPARTMENT_OTHER): Payer: PPO | Admitting: Family Medicine

## 2016-03-02 ENCOUNTER — Ambulatory Visit: Payer: PPO | Admitting: Family Medicine

## 2016-03-02 VITALS — BP 142/78 | HR 92 | Temp 95.4°F | Resp 18 | Wt 232.8 lb

## 2016-03-02 DIAGNOSIS — C3412 Malignant neoplasm of upper lobe, left bronchus or lung: Secondary | ICD-10-CM

## 2016-03-02 DIAGNOSIS — D649 Anemia, unspecified: Secondary | ICD-10-CM

## 2016-03-02 DIAGNOSIS — D696 Thrombocytopenia, unspecified: Secondary | ICD-10-CM | POA: Diagnosis not present

## 2016-03-02 DIAGNOSIS — Z510 Encounter for antineoplastic radiation therapy: Secondary | ICD-10-CM | POA: Diagnosis not present

## 2016-03-02 DIAGNOSIS — D72819 Decreased white blood cell count, unspecified: Secondary | ICD-10-CM | POA: Diagnosis not present

## 2016-03-02 DIAGNOSIS — Z79899 Other long term (current) drug therapy: Secondary | ICD-10-CM

## 2016-03-02 DIAGNOSIS — Z139 Encounter for screening, unspecified: Secondary | ICD-10-CM

## 2016-03-02 DIAGNOSIS — Z5111 Encounter for antineoplastic chemotherapy: Secondary | ICD-10-CM | POA: Diagnosis not present

## 2016-03-02 DIAGNOSIS — G629 Polyneuropathy, unspecified: Secondary | ICD-10-CM

## 2016-03-02 DIAGNOSIS — F17210 Nicotine dependence, cigarettes, uncomplicated: Secondary | ICD-10-CM

## 2016-03-02 LAB — COMPREHENSIVE METABOLIC PANEL
ALBUMIN: 3.6 g/dL (ref 3.5–5.0)
ALK PHOS: 77 U/L (ref 38–126)
ALT: 17 U/L (ref 14–54)
ANION GAP: 3 — AB (ref 5–15)
AST: 20 U/L (ref 15–41)
BUN: 10 mg/dL (ref 6–20)
CALCIUM: 8.4 mg/dL — AB (ref 8.9–10.3)
CHLORIDE: 110 mmol/L (ref 101–111)
CO2: 24 mmol/L (ref 22–32)
Creatinine, Ser: 0.71 mg/dL (ref 0.44–1.00)
GFR calc non Af Amer: >60 mL/min (ref >60)
GLUCOSE: 163 mg/dL — AB (ref 65–99)
POTASSIUM: 4.4 mmol/L (ref 3.5–5.1)
SODIUM: 137 mmol/L (ref 135–145)
Total Bilirubin: 0.5 mg/dL (ref 0.3–1.2)
Total Protein: 6.8 g/dL (ref 6.5–8.1)

## 2016-03-02 LAB — CBC WITH DIFFERENTIAL/PLATELET
BASOS PCT: 0 %
Basophils Absolute: 0 10*3/uL (ref 0–0.1)
EOS ABS: 0 10*3/uL (ref 0–0.7)
EOS PCT: 1 %
HCT: 27.7 % — ABNORMAL LOW (ref 35.0–47.0)
HEMOGLOBIN: 9.4 g/dL — AB (ref 12.0–16.0)
LYMPHS ABS: 0.4 10*3/uL — AB (ref 1.0–3.6)
Lymphocytes Relative: 24 %
MCH: 29.2 pg (ref 26.0–34.0)
MCHC: 34 g/dL (ref 32.0–36.0)
MCV: 85.9 fL (ref 80.0–100.0)
MONO ABS: 0.2 10*3/uL (ref 0.2–0.9)
MONOS PCT: 13 %
NEUTROS PCT: 62 %
Neutro Abs: 1 10*3/uL — ABNORMAL LOW (ref 1.4–6.5)
PLATELETS: 138 10*3/uL — AB (ref 150–440)
RBC: 3.23 MIL/uL — ABNORMAL LOW (ref 3.80–5.20)
RDW: 19.3 % — AB (ref 11.5–14.5)
WBC: 1.7 10*3/uL — ABNORMAL LOW (ref 3.6–11.0)

## 2016-03-02 NOTE — Progress Notes (Addendum)
[place]  [phone] [phone]) [phone]  ID: Susan Willis OB: [birth year]  MR#: [id number]  FGH#:[id number]  Patient Care Team: Marguerita Merles, MD as PCP - General (Family Medicine)  CHIEF COMPLAINT: Lung mass.  INTERVAL HISTORY: Patient returns to clinic today for further evaluation and cycle 9 of weekly carboplatinum and Taxol along with daily XRT. She does not complain of left side chest pain or shortness of breath today. She is swallowing better.  She has no complaints of fatigue or nausea today.  She does still have neuropathy in her fingers and toes but it does not interfere with her daily life. She does still struggle with altered taste. She sees a nutritionist on Monday. She has no other neurologic complaints. She denies any recent fevers or illnesses. She has a fair appetite and denies weight loss. She denies cough, or hemoptysis.  She has no nausea, vomiting, or diarrhea. Constipation is still controlled with Miralax.  She has no urinary complaints. She overall feels well today.  REVIEW OF SYSTEMS:   Review of Systems  Constitutional: Negative.  Negative for fever.  HENT: Negative.   Respiratory: Negative for cough, hemoptysis and shortness of breath.   Cardiovascular: Negative.  Negative for leg swelling.  Gastrointestinal: Negative.   Genitourinary: Negative.   Musculoskeletal: Negative.   Neurological: Positive for sensory change. Negative for weakness.    As per HPI. Otherwise, a complete review of systems is negatve.  PAST MEDICAL HISTORY: Past Medical History  Diagnosis Date  . Arthritis   . Diabetes mellitus without complication ([place])   . Hypertension   . High cholesterol   . Blood dyscrasia   . Diabetic neuropathy ([place])   . Lung cancer ([place])     PAST SURGICAL HISTORY: Past Surgical History  Procedure Laterality Date  . Abdominal hysterectomy    . Cesarean section    . Endobronchial ultrasound N/A 11/26/2015   Procedure: ENDOBRONCHIAL ULTRASOUND;  Surgeon: Flora Lipps, MD;  Location: [facility];  Service: Cardiopulmonary;  Laterality: N/A;  . Electromagnetic navigation brochoscopy N/A 11/26/2015    Procedure: ELECTROMAGNETIC NAVIGATION BRONCHOSCOPY;  Surgeon: Flora Lipps, MD;  Location: [facility];  Service: Cardiopulmonary;  Laterality: N/A;  . Peripheral vascular catheterization N/A 12/09/2015    Procedure: Glori Luis Cath Insertion;  Surgeon: Katha Cabal, MD;  Location: [place] [facility];  Service: Cardiovascular;  Laterality: N/A;    FAMILY HISTORY: Reviewed and unchanged. No reported history of malignancy or chronic disease.     ADVANCED DIRECTIVES:    HEALTH MAINTENANCE: Social History  Substance Use Topics  . Smoking status: Current Every Day Smoker -- 2.00 packs/day for 35 years    Types: Cigarettes  . Smokeless tobacco: Never Used     Comment: currently smoking 1 pk per day  . Alcohol Use: No     No Known Allergies  Current Outpatient Prescriptions  Medication Sig Dispense Refill  . atorvastatin (LIPITOR) 20 MG tablet Take 20 mg by mouth daily at 6 PM.    . fentaNYL (DURAGESIC - DOSED MCG/HR) 25 MCG/HR patch Place 1 patch (25 mcg total) onto the skin every 3 (three) days. 5 patch 0  . insulin glargine (LANTUS) 100 UNIT/ML injection Inject 60 Units into the skin at bedtime.    . lidocaine-prilocaine (EMLA) cream Apply to affected area once 30 g 6  . linagliptin (TRADJENTA) 5 MG TABS tablet Take 5 mg by mouth daily.     Marland Kitchen lisinopril (PRINIVIL,ZESTRIL) 10 MG tablet Take  10 mg by mouth daily.    . metFORMIN (GLUMETZA) 500 MG (MOD) 24 hr tablet Take 1,000 mg by mouth 2 (two) times daily with a meal. Reported on 12/23/2015    . ondansetron (ZOFRAN) 8 MG tablet Take 1 tablet (8 mg total) by mouth 2 (two) times daily. Start the day after chemo for 3 days. Then take as needed for nausea or vomiting. 30 tablet 3  . Oxycodone HCl 10 MG TABS Take 1 tablet (10 mg total) by mouth every 6 (six)  hours as needed. 60 tablet 0  . prochlorperazine (COMPAZINE) 10 MG tablet Take 1 tablet (10 mg total) by mouth every 6 (six) hours as needed for nausea, vomiting or refractory nausea / vomiting (Nausea or vomiting). 30 tablet 3  . promethazine (PHENERGAN) 25 MG tablet Take 1 tablet (25 mg total) by mouth every 6 (six) hours as needed for nausea or vomiting. 30 tablet 0  . sucralfate (CARAFATE) 1 g tablet Take 1 tablet (1 g total) by mouth 3 (three) times daily. Dissolve each tablet in 2-3 tbsp of warm water, swish and swallow. 90 tablet 3   No current facility-administered medications for this visit.    OBJECTIVE: Filed Vitals:   03/02/16 0855  BP: 142/78  Pulse: 92  Temp: 95.4 F (35.2 C)  Resp: 18     Body mass index is 39.94 kg/(m^2).    ECOG FS:0 - Asymptomatic  General: Well-developed, well-nourished, no acute distress. Eyes: Pink conjunctiva, anicteric sclera. Lungs: Clear to auscultation bilaterally. Heart: Regular rate and rhythm. No rubs, murmurs, or gallops. Abdomen: Soft, nontender, nondistended. No organomegaly noted, normoactive bowel sounds. Musculoskeletal: No edema, cyanosis, or clubbing. Neuro: Alert, answering all questions appropriately. Cranial nerves grossly intact. Skin: No rashes or petechiae noted. Psych: Normal affect.   LAB RESULTS:  Lab Results  Component Value Date   NA 137 03/02/2016   K 4.4 03/02/2016   CL 110 03/02/2016   CO2 24 03/02/2016   GLUCOSE 163* 03/02/2016   BUN 10 03/02/2016   CREATININE 0.71 03/02/2016   CALCIUM 8.4* 03/02/2016   PROT 6.8 03/02/2016   ALBUMIN 3.6 03/02/2016   AST 20 03/02/2016   ALT 17 03/02/2016   ALKPHOS 77 03/02/2016   BILITOT 0.5 03/02/2016   GFRNONAA >60 03/02/2016   GFRAA >60 03/02/2016    Lab Results  Component Value Date   WBC 1.7* 03/02/2016   NEUTROABS 1.0* 03/02/2016   HGB 9.4* 03/02/2016   HCT 27.7* 03/02/2016   MCV 85.9 03/02/2016   PLT 138* 03/02/2016     STUDIES: No results  found.  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   PLAN:    1. Lung mass: Currently on chemotherapy with Taxol and carboplatinum along with daily XRT. Proceed with cycle 9 of weekly carboplatinum and Taxol tomorrow. Her last XRT has been moved out to March 07, 2016. She will have 4 weeks off after this treatment. After chemo tomorrow,  return to clinic in 4 weeks for 2 consolidation doses of carboplatinum and Taxol.  Will reimage 6-8 weeks after conclusion of consolidation chemotherapy.  2. Nosebleed: Resolved. 3. Hypotension: Resolved. Monitor. 4. Shortness of breath: Resolved.  5. Hyperglycemia: Continue diabetic medications as prescribed. Monitor closely since patient is receiving dexamethasone as a premedication. 6. Anemia: HGB 9.4 today. monitor. 7. Swallowing difficulty: Resolved. Continue carafate. Offered dukes mouthwash but patient refuses at this time.  8. Leukopenia: WBC 1.7 and ANC 1000 today. Secondary to chemotherapy. Monitor.  Proceed with treatment as above. 9. Thrombocytopenia: Platelets 138 today. Monitor. 10. Altered taste: Nutritionist scheduled for April 1st.  11. Nausea: Resolved. Nausea medications PRN 12. Neuropathy: Monitor.  Dr. Rogue Bussing was available for consultation and review of plan of care for this patient. Patient expressed understanding and was in agreement with this plan. She also understands that She can call clinic at any time with any questions, concerns, or complaints.   Mayra Reel, NP   03/02/2016 9:19 AM   Patient was seen and evaluated independently and I agree with the assessment and plan as dictated above.  Georgeanne Nim, NP 03/02/2016 9:41am

## 2016-03-02 NOTE — Progress Notes (Signed)
Pt complains of chest tightness that is "nagging". Felt slight dizziness while walking in clinic today.

## 2016-03-03 ENCOUNTER — Encounter: Payer: Self-pay | Admitting: Oncology

## 2016-03-03 ENCOUNTER — Inpatient Hospital Stay: Payer: PPO

## 2016-03-03 ENCOUNTER — Ambulatory Visit
Admission: RE | Admit: 2016-03-03 | Discharge: 2016-03-03 | Disposition: A | Discharge status: Home / Self Care | Payer: PPO | Source: Ambulatory Visit | Attending: Radiation Oncology | Admitting: Radiation Oncology

## 2016-03-03 VITALS — BP 99/56 | HR 81 | Temp 96.6°F | Resp 18

## 2016-03-03 DIAGNOSIS — C3412 Malignant neoplasm of upper lobe, left bronchus or lung: Secondary | ICD-10-CM

## 2016-03-03 DIAGNOSIS — Z5111 Encounter for antineoplastic chemotherapy: Secondary | ICD-10-CM | POA: Diagnosis not present

## 2016-03-03 DIAGNOSIS — Z510 Encounter for antineoplastic radiation therapy: Secondary | ICD-10-CM | POA: Diagnosis not present

## 2016-03-03 MED ORDER — PACLITAXEL CHEMO INJECTION 300 MG/50ML
45.0000 mg/m2 | Freq: Once | INTRAVENOUS | Status: AC
  Administered 2016-03-03: 102 mg via INTRAVENOUS
  Filled 2016-03-03: qty 17

## 2016-03-03 MED ORDER — SODIUM CHLORIDE 0.9 % IJ SOLN
10.0000 mL | INTRAMUSCULAR | Status: DC | PRN
  Administered 2016-03-03: 10 mL
  Filled 2016-03-03: qty 10

## 2016-03-03 MED ORDER — FAMOTIDINE IN NACL 20-0.9 MG/50ML-% IV SOLN
20.0000 mg | Freq: Once | INTRAVENOUS | Status: AC
  Administered 2016-03-03: 20 mg via INTRAVENOUS
  Filled 2016-03-03: qty 50

## 2016-03-03 MED ORDER — DIPHENHYDRAMINE HCL 50 MG/ML IJ SOLN
25.0000 mg | Freq: Once | INTRAMUSCULAR | Status: AC
Start: 2016-03-03 — End: 2016-03-03
  Administered 2016-03-03: 25 mg via INTRAVENOUS
  Filled 2016-03-03: qty 1

## 2016-03-03 MED ORDER — SODIUM CHLORIDE 0.9 % IV SOLN
Freq: Once | INTRAVENOUS | Status: AC
  Administered 2016-03-03: 10:00:00 via INTRAVENOUS
  Filled 2016-03-03: qty 8

## 2016-03-03 MED ORDER — SODIUM CHLORIDE 0.9 % IV SOLN
Freq: Once | INTRAVENOUS | Status: AC
  Administered 2016-03-03: 10:00:00 via INTRAVENOUS
  Filled 2016-03-03: qty 1000

## 2016-03-03 MED ORDER — SODIUM CHLORIDE 0.9 % IV SOLN
240.6000 mg | Freq: Once | INTRAVENOUS | Status: AC
  Administered 2016-03-03: 240 mg via INTRAVENOUS
  Filled 2016-03-03: qty 24

## 2016-03-03 MED ORDER — HEPARIN SOD (PORK) LOCK FLUSH 100 UNIT/ML IV SOLN
500.0000 [IU] | Freq: Once | INTRAVENOUS | Status: AC | PRN
  Administered 2016-03-03: 500 [IU]
  Filled 2016-03-03: qty 5

## 2016-03-04 ENCOUNTER — Ambulatory Visit
Admission: RE | Admit: 2016-03-04 | Discharge: 2016-03-04 | Disposition: A | Discharge status: Home / Self Care | Payer: PPO | Source: Ambulatory Visit | Attending: Radiation Oncology | Admitting: Radiation Oncology

## 2016-03-04 DIAGNOSIS — Z510 Encounter for antineoplastic radiation therapy: Secondary | ICD-10-CM | POA: Diagnosis not present

## 2016-03-07 ENCOUNTER — Ambulatory Visit
Admission: RE | Admit: 2016-03-07 | Discharge: 2016-03-07 | Disposition: A | Discharge status: Home / Self Care | Payer: PPO | Source: Ambulatory Visit | Attending: Radiation Oncology | Admitting: Radiation Oncology

## 2016-03-07 ENCOUNTER — Other Ambulatory Visit: Payer: Self-pay | Admitting: Family Medicine

## 2016-03-07 ENCOUNTER — Encounter: Payer: Self-pay | Admitting: Dietician

## 2016-03-07 ENCOUNTER — Encounter: Payer: PPO | Attending: Internal Medicine | Admitting: Dietician

## 2016-03-07 VITALS — Ht 64.0 in | Wt 227.2 lb

## 2016-03-07 DIAGNOSIS — Z510 Encounter for antineoplastic radiation therapy: Secondary | ICD-10-CM | POA: Diagnosis not present

## 2016-03-07 DIAGNOSIS — R630 Anorexia: Secondary | ICD-10-CM

## 2016-03-07 NOTE — Patient Instructions (Signed)
   Try to eat a small amount of something every 3 hours during the day.   Eat cold foods often, they are usually less nauseating.   Include several grams of protein each time you eat. Use the list of protein foods to help.   Try a clear protein supplement drink such as Ensure clear, and/or some unflavored protein powder that you can add to foods, such as mashed potatoes, applesauce, milk (for cereal).   Try Special K protein cereal.   Goal for protein intake is 60-80grams per day.  Lemon drops can sometimes help ease nausea.

## 2016-03-07 NOTE — Progress Notes (Signed)
Medical Nutrition Therapy: Visit start time: 0900  end time: 1000  Assessment:  Diagnosis: lung cancer Past medical history: Diabetes, hypertension, hyperlipidemia Psychosocial issues/ stress concerns: none Preferred learning method:  . Auditory . Visual . Hands-on  Current weight: 227.2lbs  Height: 5'4" Medications, supplements: reviewed list in chart with patient  Progress and evaluation: patient reports poor appetite, feels nauseated and full quickly when trying to eat.         She seeks ideas for some protein options, has been told that protein is important.          She reports pain in swallowing, which should improve as last radiation treatment is today (for at least 1 month).          She is also experiencing some taste changes.     Physical activity: not assessed.   Dietary Intake:  Usual eating pattern includes 2 meals and several snacks per day. Dining out frequency: over 2 meals per week.  Breakfast: can't tolerated much early. Sometimes egg or sausage and egg sandwich.  Snack: some soft cereals such as corn flakes Lunch: none usually  Snack: fruit: applesauce, oranges (mostly juice) Supper: small amount of food. Unable to tolerate nuts, fibrous/raw vegetables, peanut butter, yogurt, pudding.  Snack: same as above.  Beverages: water, others -- specific choices not assessed.   Nutrition Care Education: Topics covered: nutrition for poor appetite during cancer treatments.  Basic nutrition: basic food groups, appropriate nutrient balance, appropriate meal and snack schedule Cancer nutrition: Discussed protein needs (min. 60-80g daily) and protein sources.           Instructed on strategies for dealing with poor appetite, nausea, and taste changes.  Nutritional Diagnosis:  NI-5.3 Inadequate protein-energy intake As related to anorexia, taste changes, nausea.  As evidenced by patient report.  Intervention: Instruction and discussion as noted above.   Provided written  resources and goals with patient input.    Encouraged patient to call as needed with any questions or concerns.  Education Materials given:  . Food lists -- 1page with plate . Dealing with poor appetite, taste changes . Goals/ instructions  Learner/ who was taught:  . Patient   Level of understanding: Marland Kitchen Verbalizes/ demonstrates competency  Demonstrated degree of understanding via:   Teach back Learning barriers: . None  Willingness to learn/ readiness for change: . Eager, change in progress  Monitoring and Evaluation:  Dietary intake, appetite, and body weight      follow up: prn

## 2016-03-08 DIAGNOSIS — Z1211 Encounter for screening for malignant neoplasm of colon: Secondary | ICD-10-CM | POA: Diagnosis not present

## 2016-03-10 ENCOUNTER — Other Ambulatory Visit: Payer: Self-pay

## 2016-03-10 MED ORDER — OXYCODONE HCL 10 MG PO TABS: 10.0000 mg | ORAL_TABLET | Freq: Four times a day (QID) | ORAL | Status: DC | PRN

## 2016-03-30 ENCOUNTER — Inpatient Hospital Stay: Payer: PPO

## 2016-03-30 ENCOUNTER — Inpatient Hospital Stay (HOSPITAL_BASED_OUTPATIENT_CLINIC_OR_DEPARTMENT_OTHER): Payer: PPO | Admitting: Oncology

## 2016-03-30 ENCOUNTER — Inpatient Hospital Stay: Payer: PPO | Attending: Oncology

## 2016-03-30 VITALS — BP 105/70 | HR 94 | Temp 96.1°F | Resp 16 | Wt 226.2 lb

## 2016-03-30 DIAGNOSIS — C3412 Malignant neoplasm of upper lobe, left bronchus or lung: Secondary | ICD-10-CM

## 2016-03-30 DIAGNOSIS — M1990 Unspecified osteoarthritis, unspecified site: Secondary | ICD-10-CM | POA: Insufficient documentation

## 2016-03-30 DIAGNOSIS — Z794 Long term (current) use of insulin: Secondary | ICD-10-CM | POA: Insufficient documentation

## 2016-03-30 DIAGNOSIS — Z9221 Personal history of antineoplastic chemotherapy: Secondary | ICD-10-CM

## 2016-03-30 DIAGNOSIS — R197 Diarrhea, unspecified: Secondary | ICD-10-CM | POA: Diagnosis not present

## 2016-03-30 DIAGNOSIS — F17210 Nicotine dependence, cigarettes, uncomplicated: Secondary | ICD-10-CM

## 2016-03-30 DIAGNOSIS — R630 Anorexia: Secondary | ICD-10-CM

## 2016-03-30 DIAGNOSIS — Z923 Personal history of irradiation: Secondary | ICD-10-CM | POA: Insufficient documentation

## 2016-03-30 DIAGNOSIS — Z7984 Long term (current) use of oral hypoglycemic drugs: Secondary | ICD-10-CM | POA: Diagnosis not present

## 2016-03-30 DIAGNOSIS — E1165 Type 2 diabetes mellitus with hyperglycemia: Secondary | ICD-10-CM | POA: Diagnosis not present

## 2016-03-30 DIAGNOSIS — D696 Thrombocytopenia, unspecified: Secondary | ICD-10-CM | POA: Diagnosis not present

## 2016-03-30 DIAGNOSIS — D649 Anemia, unspecified: Secondary | ICD-10-CM | POA: Diagnosis not present

## 2016-03-30 DIAGNOSIS — Z79899 Other long term (current) drug therapy: Secondary | ICD-10-CM | POA: Insufficient documentation

## 2016-03-30 DIAGNOSIS — E7800 Pure hypercholesterolemia, unspecified: Secondary | ICD-10-CM | POA: Diagnosis not present

## 2016-03-30 DIAGNOSIS — C801 Malignant (primary) neoplasm, unspecified: Secondary | ICD-10-CM

## 2016-03-30 DIAGNOSIS — E860 Dehydration: Secondary | ICD-10-CM

## 2016-03-30 DIAGNOSIS — I10 Essential (primary) hypertension: Secondary | ICD-10-CM | POA: Insufficient documentation

## 2016-03-30 LAB — CBC WITH DIFFERENTIAL/PLATELET
BASOS ABS: 0 10*3/uL (ref 0–0.1)
BASOS PCT: 0 %
EOS PCT: 1 %
Eosinophils Absolute: 0 10*3/uL (ref 0–0.7)
HEMATOCRIT: 25.1 % — AB (ref 35.0–47.0)
Hemoglobin: 8.8 g/dL — ABNORMAL LOW (ref 12.0–16.0)
LYMPHS PCT: 18 %
Lymphs Abs: 0.7 10*3/uL — ABNORMAL LOW (ref 1.0–3.6)
MCH: 31.3 pg (ref 26.0–34.0)
MCHC: 35.1 g/dL (ref 32.0–36.0)
MCV: 89 fL (ref 80.0–100.0)
MONO ABS: 0.4 10*3/uL (ref 0.2–0.9)
MONOS PCT: 11 %
NEUTROS ABS: 2.6 10*3/uL (ref 1.4–6.5)
Neutrophils Relative %: 70 %
PLATELETS: 103 10*3/uL — AB (ref 150–440)
RBC: 2.82 MIL/uL — ABNORMAL LOW (ref 3.80–5.20)
RDW: 19.8 % — AB (ref 11.5–14.5)
WBC: 3.7 10*3/uL (ref 3.6–11.0)

## 2016-03-30 LAB — FERRITIN: FERRITIN: 126 ng/mL (ref 11–307)

## 2016-03-30 LAB — COMPREHENSIVE METABOLIC PANEL
ALT: 20 U/L (ref 14–54)
ANION GAP: 7 (ref 5–15)
AST: 19 U/L (ref 15–41)
Albumin: 3.6 g/dL (ref 3.5–5.0)
Alkaline Phosphatase: 69 U/L (ref 38–126)
BILIRUBIN TOTAL: 0.6 mg/dL (ref 0.3–1.2)
BUN: 25 mg/dL — ABNORMAL HIGH (ref 6–20)
CHLORIDE: 113 mmol/L — AB (ref 101–111)
CO2: 21 mmol/L — ABNORMAL LOW (ref 22–32)
Calcium: 9.3 mg/dL (ref 8.9–10.3)
Creatinine, Ser: 0.91 mg/dL (ref 0.44–1.00)
GFR calc Af Amer: >60 mL/min (ref >60)
Glucose, Bld: 206 mg/dL — ABNORMAL HIGH (ref 65–99)
POTASSIUM: 3.9 mmol/L (ref 3.5–5.1)
Sodium: 141 mmol/L (ref 135–145)
TOTAL PROTEIN: 6.5 g/dL (ref 6.5–8.1)

## 2016-03-30 LAB — IRON AND TIBC
IRON: 63 ug/dL (ref 28–170)
SATURATION RATIOS: 22 % (ref 10.4–31.8)
TIBC: 290 ug/dL (ref 250–450)
UIBC: 227 ug/dL

## 2016-03-30 MED ORDER — HEPARIN SOD (PORK) LOCK FLUSH 100 UNIT/ML IV SOLN
500.0000 [IU] | Freq: Once | INTRAVENOUS | Status: AC
  Administered 2016-03-30: 500 [IU] via INTRAVENOUS

## 2016-03-30 MED ORDER — HEPARIN SOD (PORK) LOCK FLUSH 100 UNIT/ML IV SOLN
INTRAVENOUS | Status: AC
  Filled 2016-03-30: qty 5

## 2016-03-30 MED ORDER — MEGESTROL ACETATE 40 MG PO TABS: 40.0000 mg | ORAL_TABLET | Freq: Two times a day (BID) | ORAL | Status: DC

## 2016-03-30 MED ORDER — SODIUM CHLORIDE 0.9 % IV SOLN
10.0000 mg | Freq: Once | INTRAVENOUS | Status: AC
  Administered 2016-03-30: 10 mg via INTRAVENOUS
  Filled 2016-03-30: qty 1

## 2016-03-30 MED ORDER — OXYCODONE HCL 10 MG PO TABS: 10.0000 mg | ORAL_TABLET | Freq: Four times a day (QID) | ORAL | Status: DC | PRN

## 2016-03-30 MED ORDER — SODIUM CHLORIDE 0.9 % IV SOLN
Freq: Once | INTRAVENOUS | Status: AC
  Administered 2016-03-30: 10:00:00 via INTRAVENOUS
  Filled 2016-03-30: qty 1000

## 2016-03-30 NOTE — Progress Notes (Signed)
Patient is feeling dizzy, weak, and loss of appetite for several weeks.  Reports pain across left side of chest at 4/10 today.  For the past week she has been having diarrhea with the last episode last night.

## 2016-03-30 NOTE — Progress Notes (Signed)
[place]  [phone]) [phone] Fax:(336) [phone]  ID: [name] OB: 1951-04-30  MR#: [id number]  YTK#:[id number]  Patient Care Team: Marguerita Merles, MD as PCP - General (Family Medicine)  CHIEF COMPLAINT: Lung mass.  INTERVAL HISTORY: Patient returns to clinic today for further evaluation and cycle 1 of consolidation carboplatinum and Taxol. She is feeling lousy today. She has multiple complaints including left side chest pain, no appetite, fatigue, weakness, diarrhea and dizziness. She has not been eating well due to decreased appetite as well as impaired taste. She did see a nutritionalist and is drinking protein drinks. She has had diarrhea for the past week. She did not complain of neuropathy today. She has no neurologic complaints. She denies any recent fevers or illnesses. She denies cough, or hemoptysis. She has no urinary complaints.   REVIEW OF SYSTEMS:   Review of Systems  Constitutional: Positive for malaise/fatigue. Negative for fever.  HENT: Negative.   Respiratory: Negative for cough, hemoptysis and shortness of breath.   Cardiovascular: Positive for chest pain. Negative for leg swelling.  Gastrointestinal: Positive for diarrhea.  Genitourinary: Negative.   Musculoskeletal: Negative.   Neurological: Positive for dizziness and weakness.  Psychiatric/Behavioral: Negative.     As per HPI. Otherwise, a complete review of systems is negatve.  PAST MEDICAL HISTORY: Past Medical History  Diagnosis Date  . Arthritis   . Diabetes mellitus without complication ([place])   . Hypertension   . High cholesterol   . Blood dyscrasia   . Diabetic neuropathy ([place])   . Lung cancer ([place])     PAST SURGICAL HISTORY: Past Surgical History  Procedure Laterality Date  . Abdominal hysterectomy    . Cesarean section    . Endobronchial ultrasound N/A 11/26/2015    Procedure: ENDOBRONCHIAL ULTRASOUND;  Surgeon: Flora Lipps, MD;  Location: [facility];  Service:  Cardiopulmonary;  Laterality: N/A;  . Electromagnetic navigation brochoscopy N/A 11/26/2015    Procedure: ELECTROMAGNETIC NAVIGATION BRONCHOSCOPY;  Surgeon: Flora Lipps, MD;  Location: [facility];  Service: Cardiopulmonary;  Laterality: N/A;  . Peripheral vascular catheterization N/A 12/09/2015    Procedure: Glori Luis Cath Insertion;  Surgeon: Katha Cabal, MD;  Location: [facility];  Service: Cardiovascular;  Laterality: N/A;    FAMILY HISTORY: Reviewed and unchanged. No reported history of malignancy or chronic disease.     ADVANCED DIRECTIVES:    HEALTH MAINTENANCE: Social History  Substance Use Topics  . Smoking status: Current Every Day Smoker -- 0.30 packs/day for 35 years    Types: Cigarettes  . Smokeless tobacco: Never Used     Comment: workiing on quitting, down to 2 packs per week now  . Alcohol Use: No     No Known Allergies  Current Outpatient Prescriptions  Medication Sig Dispense Refill  . atorvastatin (LIPITOR) 20 MG tablet Take 20 mg by mouth daily at 6 PM.    . fentaNYL (DURAGESIC - DOSED MCG/HR) 25 MCG/HR patch Place 1 patch (25 mcg total) onto the skin every 3 (three) days. 5 patch 0  . insulin glargine (LANTUS) 100 UNIT/ML injection Inject 60 Units into the skin at bedtime.    . lidocaine-prilocaine (EMLA) cream Apply to affected area once 30 g 6  . linagliptin (TRADJENTA) 5 MG TABS tablet Take 5 mg by mouth daily.     Marland Kitchen lisinopril (PRINIVIL,ZESTRIL) 10 MG tablet Take 10 mg by mouth daily.    . metFORMIN (GLUMETZA) 500 MG (MOD) 24 hr tablet Take 1,000 mg by  mouth 2 (two) times daily with a meal. Reported on 12/23/2015    . ondansetron (ZOFRAN) 8 MG tablet Take 1 tablet (8 mg total) by mouth 2 (two) times daily. Start the day after chemo for 3 days. Then take as needed for nausea or vomiting. 30 tablet 3  . Oxycodone HCl 10 MG TABS Take 1 tablet (10 mg total) by mouth every 6 (six) hours as needed. 60 tablet 0  . prochlorperazine (COMPAZINE) 10 MG tablet  Take 1 tablet (10 mg total) by mouth every 6 (six) hours as needed for nausea, vomiting or refractory nausea / vomiting (Nausea or vomiting). 30 tablet 3  . promethazine (PHENERGAN) 25 MG tablet Take 1 tablet (25 mg total) by mouth every 6 (six) hours as needed for nausea or vomiting. 30 tablet 0  . sucralfate (CARAFATE) 1 g tablet Take 1 tablet (1 g total) by mouth 3 (three) times daily. Dissolve each tablet in 2-3 tbsp of warm water, swish and swallow. 90 tablet 3   Current Facility-Administered Medications  Medication Dose Route Frequency Provider Last Rate Last Dose  . 0.9 %  sodium chloride infusion   Intravenous Once Mayra Reel, NP      . dexamethasone (DECADRON) 10 mg in sodium chloride 0.9 % 50 mL IVPB  10 mg Intravenous Once Mayra Reel, NP        OBJECTIVE: Filed Vitals:   03/30/16 0910  BP: 105/70  Pulse: 94  Temp: 96.1 F (35.6 C)  Resp: 16     Body mass index is 38.81 kg/(m^2).    ECOG FS:0 - Asymptomatic  General: Well-developed, well-nourished, no acute distress. Eyes: Pink conjunctiva, anicteric sclera. Lungs: Clear to auscultation bilaterally. Heart: Regular rate and rhythm. No rubs, murmurs, or gallops. Abdomen: Soft, nontender, nondistended. No organomegaly noted, normoactive bowel sounds. Musculoskeletal: No edema, cyanosis, or clubbing. Neuro: Alert, answering all questions appropriately. Cranial nerves grossly intact. Skin: No rashes or petechiae noted. Psych: Normal affect.   LAB RESULTS:  Lab Results  Component Value Date   NA 141 03/30/2016   K 3.9 03/30/2016   CL 113* 03/30/2016   CO2 21* 03/30/2016   GLUCOSE 206* 03/30/2016   BUN 25* 03/30/2016   CREATININE 0.91 03/30/2016   CALCIUM 9.3 03/30/2016   PROT 6.5 03/30/2016   ALBUMIN 3.6 03/30/2016   AST 19 03/30/2016   ALT 20 03/30/2016   ALKPHOS 69 03/30/2016   BILITOT 0.6 03/30/2016   GFRNONAA >60 03/30/2016   GFRAA >60 03/30/2016    Lab Results  Component Value Date   WBC 3.7  03/30/2016   NEUTROABS 2.6 03/30/2016   HGB 8.8* 03/30/2016   HCT 25.1* 03/30/2016   MCV 89.0 03/30/2016   PLT 103* 03/30/2016   Lab Results  Component Value Date   IRON 63 03/30/2016   TIBC 290 03/30/2016   IRONPCTSAT 22 03/30/2016    Lab Results  Component Value Date   FERRITIN 126 03/30/2016     STUDIES: No results found.  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   PLAN:    1. Lung mass: Finished XRT on March 07, 2016. She finished 9 cycles of concurrent taxol/carbo. Hold cycle 1 consolidation taxol/carbo today due to patient feeling so badly. RTC in one week for lab and reconsideration of cycle 1. She will have 2 cycles of consolidation taxol/carbo and then rescan 6-8 weeks after the second cycle.  2. Anemia: HGB 8.8 today. Iron studies and ferritin today are WNL. Patient has  not had chemotherapy for 4 weeks.  Monitor. 3. Thrombocytopenia: Platelets 103 today. Likely residual from chemotherapy. Patient has not had chemotherapy for 4 weeks. Monitor 4. Hyperglycemia: Continue diabetic medications as prescribed. Monitor closely since patient will be receiving dexamethasone as a premedication. 5. Altered taste: Secondary to chemotherapy.  6. Diarrhea: Unknown etiology. One litre IV fluids and decadron today.  7. Decreased appetite: Megace to pharmacy today.  Patient expressed understanding and was in agreement with this plan. She also understands that She can call clinic at any time with any questions, concerns, or complaints.   Mayra Reel, NP   03/30/2016 9:43 AM   Patient was seen and evaluated independently and I agree with the assessment and plan as dictated above.  [name], MD 03/30/2016 12:24 PM

## 2016-04-06 ENCOUNTER — Ambulatory Visit
Admission: RE | Admit: 2016-04-06 | Discharge: 2016-04-06 | Disposition: A | Discharge status: Home / Self Care | Payer: PPO | Source: Ambulatory Visit | Attending: Radiation Oncology | Admitting: Radiation Oncology

## 2016-04-06 ENCOUNTER — Inpatient Hospital Stay: Payer: PPO | Attending: Oncology | Admitting: Oncology

## 2016-04-06 ENCOUNTER — Encounter: Payer: Self-pay | Admitting: Radiation Oncology

## 2016-04-06 ENCOUNTER — Inpatient Hospital Stay: Payer: PPO

## 2016-04-06 VITALS — BP 103/64 | HR 81 | Temp 96.4°F | Resp 16 | Wt 233.2 lb

## 2016-04-06 VITALS — BP 130/79 | HR 75 | Resp 20

## 2016-04-06 DIAGNOSIS — Z79899 Other long term (current) drug therapy: Secondary | ICD-10-CM | POA: Diagnosis not present

## 2016-04-06 DIAGNOSIS — I10 Essential (primary) hypertension: Secondary | ICD-10-CM | POA: Diagnosis not present

## 2016-04-06 DIAGNOSIS — Z923 Personal history of irradiation: Secondary | ICD-10-CM | POA: Diagnosis not present

## 2016-04-06 DIAGNOSIS — D696 Thrombocytopenia, unspecified: Secondary | ICD-10-CM | POA: Insufficient documentation

## 2016-04-06 DIAGNOSIS — R42 Dizziness and giddiness: Secondary | ICD-10-CM | POA: Diagnosis not present

## 2016-04-06 DIAGNOSIS — D649 Anemia, unspecified: Secondary | ICD-10-CM | POA: Diagnosis not present

## 2016-04-06 DIAGNOSIS — E1165 Type 2 diabetes mellitus with hyperglycemia: Secondary | ICD-10-CM

## 2016-04-06 DIAGNOSIS — E1140 Type 2 diabetes mellitus with diabetic neuropathy, unspecified: Secondary | ICD-10-CM | POA: Diagnosis not present

## 2016-04-06 DIAGNOSIS — Z7984 Long term (current) use of oral hypoglycemic drugs: Secondary | ICD-10-CM | POA: Insufficient documentation

## 2016-04-06 DIAGNOSIS — C3412 Malignant neoplasm of upper lobe, left bronchus or lung: Secondary | ICD-10-CM

## 2016-04-06 DIAGNOSIS — G629 Polyneuropathy, unspecified: Secondary | ICD-10-CM | POA: Diagnosis not present

## 2016-04-06 DIAGNOSIS — M1990 Unspecified osteoarthritis, unspecified site: Secondary | ICD-10-CM | POA: Diagnosis not present

## 2016-04-06 DIAGNOSIS — Z794 Long term (current) use of insulin: Secondary | ICD-10-CM | POA: Diagnosis not present

## 2016-04-06 DIAGNOSIS — D72819 Decreased white blood cell count, unspecified: Secondary | ICD-10-CM | POA: Insufficient documentation

## 2016-04-06 DIAGNOSIS — F17210 Nicotine dependence, cigarettes, uncomplicated: Secondary | ICD-10-CM

## 2016-04-06 DIAGNOSIS — G4700 Insomnia, unspecified: Secondary | ICD-10-CM | POA: Insufficient documentation

## 2016-04-06 DIAGNOSIS — Z5111 Encounter for antineoplastic chemotherapy: Secondary | ICD-10-CM | POA: Diagnosis not present

## 2016-04-06 DIAGNOSIS — E7800 Pure hypercholesterolemia, unspecified: Secondary | ICD-10-CM | POA: Diagnosis not present

## 2016-04-06 DIAGNOSIS — R739 Hyperglycemia, unspecified: Secondary | ICD-10-CM | POA: Insufficient documentation

## 2016-04-06 LAB — CBC WITH DIFFERENTIAL/PLATELET
BASOS PCT: 0 %
Basophils Absolute: 0 10*3/uL (ref 0–0.1)
EOS ABS: 0 10*3/uL (ref 0–0.7)
Eosinophils Relative: 1 %
HCT: 25.1 % — ABNORMAL LOW (ref 35.0–47.0)
HEMOGLOBIN: 8.7 g/dL — AB (ref 12.0–16.0)
Lymphocytes Relative: 21 %
Lymphs Abs: 0.6 10*3/uL — ABNORMAL LOW (ref 1.0–3.6)
MCH: 31.3 pg (ref 26.0–34.0)
MCHC: 34.7 g/dL (ref 32.0–36.0)
MCV: 90.5 fL (ref 80.0–100.0)
MONOS PCT: 9 %
Monocytes Absolute: 0.2 10*3/uL (ref 0.2–0.9)
NEUTROS PCT: 69 %
Neutro Abs: 1.9 10*3/uL (ref 1.4–6.5)
Platelets: 106 10*3/uL — ABNORMAL LOW (ref 150–440)
RBC: 2.78 MIL/uL — ABNORMAL LOW (ref 3.80–5.20)
RDW: 19.6 % — AB (ref 11.5–14.5)
WBC: 2.7 10*3/uL — ABNORMAL LOW (ref 3.6–11.0)

## 2016-04-06 LAB — COMPREHENSIVE METABOLIC PANEL
ALBUMIN: 3.5 g/dL (ref 3.5–5.0)
ALK PHOS: 68 U/L (ref 38–126)
ALT: 23 U/L (ref 14–54)
ANION GAP: 6 (ref 5–15)
AST: 23 U/L (ref 15–41)
BUN: 13 mg/dL (ref 6–20)
CO2: 27 mmol/L (ref 22–32)
Calcium: 9.2 mg/dL (ref 8.9–10.3)
Chloride: 105 mmol/L (ref 101–111)
Creatinine, Ser: 0.66 mg/dL (ref 0.44–1.00)
GFR calc Af Amer: >60 mL/min (ref >60)
GFR calc non Af Amer: >60 mL/min (ref >60)
Glucose, Bld: 207 mg/dL — ABNORMAL HIGH (ref 65–99)
POTASSIUM: 4.6 mmol/L (ref 3.5–5.1)
SODIUM: 138 mmol/L (ref 135–145)
Total Bilirubin: 0.5 mg/dL (ref 0.3–1.2)
Total Protein: 6.6 g/dL (ref 6.5–8.1)

## 2016-04-06 MED ORDER — SODIUM CHLORIDE 0.9 % IV SOLN
573.0000 mg | Freq: Once | INTRAVENOUS | Status: AC
  Administered 2016-04-06: 570 mg via INTRAVENOUS
  Filled 2016-04-06: qty 57

## 2016-04-06 MED ORDER — DIPHENHYDRAMINE HCL 50 MG/ML IJ SOLN
25.0000 mg | Freq: Once | INTRAMUSCULAR | Status: AC
  Administered 2016-04-06: 25 mg via INTRAVENOUS
  Filled 2016-04-06: qty 1

## 2016-04-06 MED ORDER — DEXAMETHASONE SODIUM PHOSPHATE 100 MG/10ML IJ SOLN
10.0000 mg | Freq: Once | INTRAMUSCULAR | Status: AC
  Administered 2016-04-06: 10 mg via INTRAVENOUS
  Filled 2016-04-06: qty 1

## 2016-04-06 MED ORDER — PALONOSETRON HCL INJECTION 0.25 MG/5ML
0.2500 mg | Freq: Once | INTRAVENOUS | Status: AC
  Administered 2016-04-06: 0.25 mg via INTRAVENOUS
  Filled 2016-04-06: qty 5

## 2016-04-06 MED ORDER — HEPARIN SOD (PORK) LOCK FLUSH 100 UNIT/ML IV SOLN
500.0000 [IU] | Freq: Once | INTRAVENOUS | Status: DC | PRN
  Filled 2016-04-06: qty 5

## 2016-04-06 MED ORDER — SODIUM CHLORIDE 0.9 % IV SOLN
175.0000 mg/m2 | Freq: Once | INTRAVENOUS | Status: AC
  Administered 2016-04-06: 378 mg via INTRAVENOUS
  Filled 2016-04-06: qty 63

## 2016-04-06 MED ORDER — SODIUM CHLORIDE 0.9 % IV SOLN
Freq: Once | INTRAVENOUS | Status: AC
  Administered 2016-04-06: 11:00:00 via INTRAVENOUS
  Filled 2016-04-06: qty 1000

## 2016-04-06 MED ORDER — FAMOTIDINE IN NACL 20-0.9 MG/50ML-% IV SOLN
20.0000 mg | Freq: Once | INTRAVENOUS | Status: AC
  Administered 2016-04-06: 20 mg via INTRAVENOUS
  Filled 2016-04-06: qty 50

## 2016-04-06 NOTE — Progress Notes (Signed)
[place]  [phone] Fax:(336) [phone]  ID: Susan Willis OB: June 11, 1951  MR#: [id number]  PJK#:[id number]  Patient Care Team: Marguerita Merles, MD as PCP - General (Family Medicine)  CHIEF COMPLAINT: Lung mass.  INTERVAL HISTORY: Patient returns to clinic today for revaluation and cycle 1 of consolidation carboplatinum and Taxol. She is feeling better today. Her only complaint today is dizziness. She has a little tingling in her fingers. She has no other neurologic complaints. She denies any chest pain or shortness of breath. She denies constipation, diarrhea, nausea, or vomiting.  She denies any recent fevers or illnesses. She denies cough, or hemoptysis. She has no urinary complaints. Patient offers no further specific complaints today.  REVIEW OF SYSTEMS:   Review of Systems  Constitutional: Negative for fever and malaise/fatigue.  HENT: Negative.   Respiratory: Negative for cough, hemoptysis and shortness of breath.   Cardiovascular: Negative for chest pain and leg swelling.  Gastrointestinal: Negative for diarrhea.  Genitourinary: Negative.   Musculoskeletal: Negative.   Neurological: Positive for dizziness and sensory change. Negative for weakness.  Psychiatric/Behavioral: Negative.     As per HPI. Otherwise, a complete review of systems is negatve.  PAST MEDICAL HISTORY: Past Medical History  Diagnosis Date  . Arthritis   . Diabetes mellitus without complication ([place])   . Hypertension   . High cholesterol   . Blood dyscrasia   . Diabetic neuropathy ([place])   . Lung cancer ([place])     PAST SURGICAL HISTORY: Past Surgical History  Procedure Laterality Date  . Abdominal hysterectomy    . Cesarean section    . Endobronchial ultrasound N/A 11/26/2015    Procedure: ENDOBRONCHIAL ULTRASOUND;  Surgeon: Flora Lipps, MD;  Location: [facility];  Service: Cardiopulmonary;  Laterality: N/A;  . Electromagnetic navigation brochoscopy N/A 11/26/2015      Procedure: ELECTROMAGNETIC NAVIGATION BRONCHOSCOPY;  Surgeon: Flora Lipps, MD;  Location: [facility];  Service: Cardiopulmonary;  Laterality: N/A;  . Peripheral vascular catheterization N/A 12/09/2015    Procedure: Glori Luis Cath Insertion;  Surgeon: Katha Cabal, MD;  Location: [place] [facility];  Service: Cardiovascular;  Laterality: N/A;    FAMILY HISTORY: Reviewed and unchanged. No reported history of malignancy or chronic disease.     ADVANCED DIRECTIVES:    HEALTH MAINTENANCE: Social History  Substance Use Topics  . Smoking status: Current Every Day Smoker -- 0.30 packs/day for 35 years    Types: Cigarettes  . Smokeless tobacco: Never Used     Comment: workiing on quitting, down to 2 packs per week now  . Alcohol Use: No     No Known Allergies  Current Outpatient Prescriptions  Medication Sig Dispense Refill  . atorvastatin (LIPITOR) 20 MG tablet Take 20 mg by mouth daily at 6 PM.    . fentaNYL (DURAGESIC - DOSED MCG/HR) 25 MCG/HR patch Place 1 patch (25 mcg total) onto the skin every 3 (three) days. 5 patch 0  . insulin glargine (LANTUS) 100 UNIT/ML injection Inject 60 Units into the skin at bedtime.    . lidocaine-prilocaine (EMLA) cream Apply to affected area once 30 g 6  . linagliptin (TRADJENTA) 5 MG TABS tablet Take 5 mg by mouth daily.     Marland Kitchen lisinopril (PRINIVIL,ZESTRIL) 10 MG tablet Take 10 mg by mouth daily.    . metFORMIN (GLUMETZA) 500 MG (MOD) 24 hr tablet Take 1,000 mg by mouth 2 (two) times daily with a meal. Reported on 12/23/2015    . ondansetron (  ZOFRAN) 8 MG tablet Take 1 tablet (8 mg total) by mouth 2 (two) times daily. Start the day after chemo for 3 days. Then take as needed for nausea or vomiting. 30 tablet 3  . Oxycodone HCl 10 MG TABS Take 1 tablet (10 mg total) by mouth every 6 (six) hours as needed. 60 tablet 0  . prochlorperazine (COMPAZINE) 10 MG tablet Take 1 tablet (10 mg total) by mouth every 6 (six) hours as needed for nausea, vomiting or  refractory nausea / vomiting (Nausea or vomiting). 30 tablet 3  . promethazine (PHENERGAN) 25 MG tablet Take 1 tablet (25 mg total) by mouth every 6 (six) hours as needed for nausea or vomiting. 30 tablet 0  . megestrol (MEGACE) 40 MG tablet Take 1 tablet (40 mg total) by mouth 2 (two) times daily. (Patient not taking: Reported on 04/06/2016) 60 tablet 0  . sucralfate (CARAFATE) 1 g tablet Take 1 tablet (1 g total) by mouth 3 (three) times daily. Dissolve each tablet in 2-3 tbsp of warm water, swish and swallow. (Patient not taking: Reported on 04/06/2016) 90 tablet 3   No current facility-administered medications for this visit.    OBJECTIVE: Filed Vitals:   04/06/16 0955  BP: 103/64  Pulse: 81  Temp: 96.4 F (35.8 C)  Resp: 16     Body mass index is 40.02 kg/(m^2).    ECOG FS:0 - Asymptomatic  General: Well-developed, well-nourished, no acute distress. Eyes: Pink conjunctiva, anicteric sclera. Lungs: Clear to auscultation bilaterally. Heart: Regular rate and rhythm. No rubs, murmurs, or gallops. Abdomen: Soft, nontender, nondistended. No organomegaly noted, normoactive bowel sounds. Musculoskeletal: No edema, cyanosis, or clubbing. Neuro: Alert, answering all questions appropriately. Cranial nerves grossly intact. Skin: No rashes or petechiae noted. Psych: Normal affect.   LAB RESULTS:  Lab Results  Component Value Date   NA 138 04/06/2016   K 4.6 04/06/2016   CL 105 04/06/2016   CO2 27 04/06/2016   GLUCOSE 207* 04/06/2016   BUN 13 04/06/2016   CREATININE 0.66 04/06/2016   CALCIUM 9.2 04/06/2016   PROT 6.6 04/06/2016   ALBUMIN 3.5 04/06/2016   AST 23 04/06/2016   ALT 23 04/06/2016   ALKPHOS 68 04/06/2016   BILITOT 0.5 04/06/2016   GFRNONAA >60 04/06/2016   GFRAA >60 04/06/2016    Lab Results  Component Value Date   WBC 2.7* 04/06/2016   NEUTROABS 1.9 04/06/2016   HGB 8.7* 04/06/2016   HCT 25.1* 04/06/2016   MCV 90.5 04/06/2016   PLT 106* 04/06/2016   Lab  Results  Component Value Date   IRON 63 03/30/2016   TIBC 290 03/30/2016   IRONPCTSAT 22 03/30/2016    Lab Results  Component Value Date   FERRITIN 126 03/30/2016     STUDIES: No results found.  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   PLAN:    1. Lung mass: Finished XRT on March 07, 2016. She finished 9 cycles of concurrent taxol/carbo. Proceed with cycle 1 consolidation taxol/carbo today. RTC in one week for lab and evaluation and in 3 weeks for consideration of cycle 2. She will have 2 cycles of consolidation taxol/carbo and then rescan 6-8 weeks after the second cycle.  2. Anemia: HGB 8.7 today. Iron studies and ferritin today are WNL. Monitor. 3. Thrombocytopenia: Platelets 106 today. Monitor 4. Hyperglycemia: Continue diabetic medications as prescribed. Monitor closely since patient will be receiving dexamethasone as a premedication. 5. Dizziness: Unclear etiology. Patient has appointment with her  pcp in May 2017. 6. Peripheral Neuropathy: Monitor. 7. Leukopenia: WBC 2.7, ANC 1900. Monitor  Patient expressed understanding and was in agreement with this plan. She also understands that She can call clinic at any time with any questions, concerns, or complaints.   Mayra Reel, NP   04/06/2016 10:14 AM   Patient was seen and evaluated independently and I agree with the assessment and plan as dictated above.  [name], MD 04/07/2016 5:04 PM

## 2016-04-06 NOTE — Progress Notes (Signed)
Radiation Oncology Follow up Note  Name: Susan Willis   Date:   04/06/2016 MRN:  [id number] DOB: July 14, 1951    This 65 y.o. female presents to the clinic today for one-month follow-up for stage IIIa non-small cell lung cancer status post chemotherapy therapy radiation.  REFERRING PROVIDER: Marguerita Merles, MD  HPI: Patient is a 65 year old female now 1 month out having completed combined modality treatment with chemotherapy and radiation for stage IIIa (T4 N2 M0) non-small cell lung cancer. She is seen today in routine follow-up and is doing well. She was treated with Taxol carboplatinum and is still receiving consolidative treatment. She has minor pancytopenia. She specifically denies cough hemoptysis or chest tightness.  COMPLICATIONS OF TREATMENT: none  FOLLOW UP COMPLIANCE: keeps appointments   PHYSICAL EXAM:  There were no vitals taken for this visit. Well-developed well-nourished patient in NAD. HEENT reveals PERLA, EOMI, discs not visualized.  Oral cavity is clear. No oral mucosal lesions are identified. Neck is clear without evidence of cervical or supraclavicular adenopathy. Lungs are clear to A&P. Cardiac examination is essentially unremarkable with regular rate and rhythm without murmur rub or thrill. Abdomen is benign with no organomegaly or masses noted. Motor sensory and DTR levels are equal and symmetric in the upper and lower extremities. Cranial nerves II through XII are grossly intact. Proprioception is intact. No peripheral adenopathy or edema is identified. No motor or sensory levels are noted. Crude visual fields are within normal range.  RADIOLOGY RESULTS: Follow-up CT scan will be reviewed when available  PLAN: At the present time she is doing well no residual side effects from her radiation therapy. I am please were overall progress. Should continue consolidative chemotherapy. Will probably have a CT scan next month or 2 I've asked to review that when it becomes  available. Otherwise I've asked to see her back in about 3-4 months for follow-up. Patient knows to call sooner with any concerns.  I would like to take this opportunity for allowing me to participate in the care of your patient.Armstead Peaks., MD

## 2016-04-06 NOTE — Progress Notes (Signed)
Patient is having dizziness.  Blood pressure today is 103/64.

## 2016-04-13 ENCOUNTER — Inpatient Hospital Stay (HOSPITAL_BASED_OUTPATIENT_CLINIC_OR_DEPARTMENT_OTHER): Payer: PPO | Admitting: Family Medicine

## 2016-04-13 ENCOUNTER — Inpatient Hospital Stay: Payer: PPO

## 2016-04-13 VITALS — BP 99/68 | HR 97 | Temp 96.0°F | Resp 20 | Wt 224.0 lb

## 2016-04-13 DIAGNOSIS — C3412 Malignant neoplasm of upper lobe, left bronchus or lung: Secondary | ICD-10-CM

## 2016-04-13 DIAGNOSIS — Z5111 Encounter for antineoplastic chemotherapy: Secondary | ICD-10-CM | POA: Diagnosis not present

## 2016-04-13 DIAGNOSIS — Z923 Personal history of irradiation: Secondary | ICD-10-CM

## 2016-04-13 DIAGNOSIS — D72819 Decreased white blood cell count, unspecified: Secondary | ICD-10-CM | POA: Diagnosis not present

## 2016-04-13 DIAGNOSIS — D696 Thrombocytopenia, unspecified: Secondary | ICD-10-CM

## 2016-04-13 DIAGNOSIS — E1165 Type 2 diabetes mellitus with hyperglycemia: Secondary | ICD-10-CM

## 2016-04-13 DIAGNOSIS — F17210 Nicotine dependence, cigarettes, uncomplicated: Secondary | ICD-10-CM

## 2016-04-13 DIAGNOSIS — G629 Polyneuropathy, unspecified: Secondary | ICD-10-CM

## 2016-04-13 DIAGNOSIS — Z79899 Other long term (current) drug therapy: Secondary | ICD-10-CM

## 2016-04-13 DIAGNOSIS — D649 Anemia, unspecified: Secondary | ICD-10-CM

## 2016-04-13 DIAGNOSIS — R42 Dizziness and giddiness: Secondary | ICD-10-CM

## 2016-04-13 LAB — COMPREHENSIVE METABOLIC PANEL
ALK PHOS: 77 U/L (ref 38–126)
ALT: 19 U/L (ref 14–54)
ANION GAP: 5 (ref 5–15)
AST: 16 U/L (ref 15–41)
Albumin: 3.7 g/dL (ref 3.5–5.0)
BILIRUBIN TOTAL: 0.5 mg/dL (ref 0.3–1.2)
BUN: 23 mg/dL — ABNORMAL HIGH (ref 6–20)
CO2: 26 mmol/L (ref 22–32)
Calcium: 9.3 mg/dL (ref 8.9–10.3)
Chloride: 104 mmol/L (ref 101–111)
Creatinine, Ser: 0.93 mg/dL (ref 0.44–1.00)
GFR calc Af Amer: >60 mL/min (ref >60)
Glucose, Bld: 232 mg/dL — ABNORMAL HIGH (ref 65–99)
POTASSIUM: 4.5 mmol/L (ref 3.5–5.1)
Sodium: 135 mmol/L (ref 135–145)
Total Protein: 6.9 g/dL (ref 6.5–8.1)

## 2016-04-13 LAB — CBC WITH DIFFERENTIAL/PLATELET
Basophils Absolute: 0 10*3/uL (ref 0–0.1)
Basophils Relative: 0 %
Eosinophils Absolute: 0 10*3/uL (ref 0–0.7)
Eosinophils Relative: 1 %
HEMATOCRIT: 25.7 % — AB (ref 35.0–47.0)
Hemoglobin: 8.8 g/dL — ABNORMAL LOW (ref 12.0–16.0)
LYMPHS ABS: 0.6 10*3/uL — AB (ref 1.0–3.6)
LYMPHS PCT: 39 %
MCH: 31.6 pg (ref 26.0–34.0)
MCHC: 34.3 g/dL (ref 32.0–36.0)
MCV: 92.2 fL (ref 80.0–100.0)
MONO ABS: 0.1 10*3/uL — AB (ref 0.2–0.9)
MONOS PCT: 6 %
NEUTROS ABS: 0.8 10*3/uL — AB (ref 1.4–6.5)
Neutrophils Relative %: 54 %
Platelets: 91 10*3/uL — ABNORMAL LOW (ref 150–440)
RBC: 2.79 MIL/uL — ABNORMAL LOW (ref 3.80–5.20)
RDW: 16.8 % — AB (ref 11.5–14.5)
WBC: 1.5 10*3/uL — ABNORMAL LOW (ref 3.6–11.0)

## 2016-04-13 MED ORDER — OXYCODONE HCL 10 MG PO TABS: 10.0000 mg | ORAL_TABLET | Freq: Four times a day (QID) | ORAL | Status: DC | PRN

## 2016-04-13 NOTE — Progress Notes (Signed)
[facility]  Telephone:(336) [phone] Fax:(336) [phone]  ID: Susan Willis OB: 12/15/1950  MR#: [id number]  RDE#:[id number]  Patient Care Team: Marguerita Merles, MD as PCP - General (Family Medicine)  CHIEF COMPLAINT: Lung mass.  INTERVAL HISTORY: Patient returns to clinic today for further evaluation following cycle 1 of consolidation carboplatinum and Taxol. Patient reports that she experienced some muscle aches and pains as well as joint pains mainly in her knees following chemotherapy. It does not appear that patient received Neulasta injection on day 3. She reports having taken a pain medicine as needed. She reports the symptoms have improved over the last couple of days. She has a little tingling in her fingers. She has no other neurologic complaints. She denies any chest pain or shortness of breath. She denies constipation, diarrhea, nausea, or vomiting.  She denies any recent fevers or illnesses. She denies cough, or hemoptysis. She has no urinary complaints. Patient offers no further specific complaints today.  REVIEW OF SYSTEMS:   Review of Systems  Constitutional: Negative for fever, chills, weight loss, malaise/fatigue and diaphoresis.  HENT: Negative.   Eyes: Negative.   Respiratory: Negative for cough, hemoptysis, sputum production, shortness of breath and wheezing.   Cardiovascular: Negative for chest pain, palpitations, orthopnea, claudication, leg swelling and PND.  Gastrointestinal: Negative for heartburn, nausea, vomiting, abdominal pain, diarrhea, constipation, blood in stool and melena.  Genitourinary: Negative.   Musculoskeletal: Positive for myalgias and joint pain.  Skin: Negative.   Neurological: Positive for dizziness and sensory change. Negative for tingling, focal weakness, seizures and weakness.  Endo/Heme/Allergies: Does not bruise/bleed easily.  Psychiatric/Behavioral: Negative.  Negative for depression. The patient is not nervous/anxious and  does not have insomnia.     As per HPI. Otherwise, a complete review of systems is negatve.  PAST MEDICAL HISTORY: Past Medical History  Diagnosis Date  . Arthritis   . Diabetes mellitus without complication ([place])   . Hypertension   . High cholesterol   . Blood dyscrasia   . Diabetic neuropathy ([place])   . Lung cancer ([facility])     PAST SURGICAL HISTORY: Past Surgical History  Procedure Laterality Date  . Abdominal hysterectomy    . Cesarean section    . Endobronchial ultrasound N/A 11/26/2015    Procedure: ENDOBRONCHIAL ULTRASOUND;  Surgeon: Flora Lipps, MD;  Location: [facility];  Service: Cardiopulmonary;  Laterality: N/A;  . Electromagnetic navigation brochoscopy N/A 11/26/2015    Procedure: ELECTROMAGNETIC NAVIGATION BRONCHOSCOPY;  Surgeon: Flora Lipps, MD;  Location: [facility];  Service: Cardiopulmonary;  Laterality: N/A;  . Peripheral vascular catheterization N/A 12/09/2015    Procedure: Glori Luis Cath Insertion;  Surgeon: Katha Cabal, MD;  Location: [facility];  Service: Cardiovascular;  Laterality: N/A;    FAMILY HISTORY: Reviewed and unchanged. No reported history of malignancy or chronic disease.     ADVANCED DIRECTIVES:    HEALTH MAINTENANCE: Social History  Substance Use Topics  . Smoking status: Current Every Day Smoker -- 0.30 packs/day for 35 years    Types: Cigarettes  . Smokeless tobacco: Never Used     Comment: workiing on quitting, down to 2 packs per week now  . Alcohol Use: No     No Known Allergies  Current Outpatient Prescriptions  Medication Sig Dispense Refill  . atorvastatin (LIPITOR) 20 MG tablet Take 20 mg by mouth daily at 6 PM.    . insulin glargine (LANTUS) 100 UNIT/ML injection Inject 60 Units into the skin at bedtime.    Marland Kitchen  lidocaine-prilocaine (EMLA) cream Apply to affected area once 30 g 6  . linagliptin (TRADJENTA) 5 MG TABS tablet Take 5 mg by mouth daily.     Marland Kitchen lisinopril (PRINIVIL,ZESTRIL) 10 MG tablet Take 10 mg by mouth  daily.    . metFORMIN (GLUMETZA) 500 MG (MOD) 24 hr tablet Take 1,000 mg by mouth 2 (two) times daily with a meal. Reported on 12/23/2015    . ondansetron (ZOFRAN) 8 MG tablet Take 1 tablet (8 mg total) by mouth 2 (two) times daily. Start the day after chemo for 3 days. Then take as needed for nausea or vomiting. 30 tablet 3  . Oxycodone HCl 10 MG TABS Take 1 tablet (10 mg total) by mouth every 6 (six) hours as needed. 60 tablet 0  . prochlorperazine (COMPAZINE) 10 MG tablet Take 1 tablet (10 mg total) by mouth every 6 (six) hours as needed for nausea, vomiting or refractory nausea / vomiting (Nausea or vomiting). 30 tablet 3  . promethazine (PHENERGAN) 25 MG tablet Take 1 tablet (25 mg total) by mouth every 6 (six) hours as needed for nausea or vomiting. 30 tablet 0  . sucralfate (CARAFATE) 1 g tablet Take 1 tablet (1 g total) by mouth 3 (three) times daily. Dissolve each tablet in 2-3 tbsp of warm water, swish and swallow. 90 tablet 3   No current facility-administered medications for this visit.    OBJECTIVE: Filed Vitals:   04/13/16 1108  BP: 99/68  Pulse: 97  Temp: 96 F (35.6 C)  Resp: 20     Body mass index is 38.43 kg/(m^2).    ECOG FS:1 - Symptomatic but completely ambulatory  General: Well-developed, well-nourished, no acute distress. Eyes: Pink conjunctiva, anicteric sclera. Lungs: Clear to auscultation bilaterally. Heart: Regular rate and rhythm. No rubs, murmurs, or gallops. Abdomen: Soft, nontender, nondistended. No organomegaly noted, normoactive bowel sounds. Musculoskeletal: No edema, cyanosis, or clubbing. Neuro: Alert, answering all questions appropriately. Cranial nerves grossly intact. Skin: No rashes or petechiae noted. Psych: Normal affect.   LAB RESULTS:  Lab Results  Component Value Date   NA 135 04/13/2016   K 4.5 04/13/2016   CL 104 04/13/2016   CO2 26 04/13/2016   GLUCOSE 232* 04/13/2016   BUN 23* 04/13/2016   CREATININE 0.93 04/13/2016   CALCIUM  9.3 04/13/2016   PROT 6.9 04/13/2016   ALBUMIN 3.7 04/13/2016   AST 16 04/13/2016   ALT 19 04/13/2016   ALKPHOS 77 04/13/2016   BILITOT 0.5 04/13/2016   GFRNONAA >60 04/13/2016   GFRAA >60 04/13/2016    Lab Results  Component Value Date   WBC 1.5* 04/13/2016   NEUTROABS 0.8* 04/13/2016   HGB 8.8* 04/13/2016   HCT 25.7* 04/13/2016   MCV 92.2 04/13/2016   PLT 91* 04/13/2016   Lab Results  Component Value Date   IRON 63 03/30/2016   TIBC 290 03/30/2016   IRONPCTSAT 22 03/30/2016    Lab Results  Component Value Date   FERRITIN 126 03/30/2016     STUDIES: No results found.  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   PLAN:    1. Lung mass: Finished XRT on March 07, 2016. She finished 9 cycles of concurrent taxol/carbo. She has completed cycle 1 consolidation taxol/carbo approximately 1 week ago and appeared to tolerate fairly well. Patient has scheduled follow-up on May 24 for cycle 2 of chemotherapy. She reports that side effects are beginning to resolve including myalgias and joint discomfort. She will have  2 cycles of consolidation taxol/carbo and then rescan 6-8 weeks after the second cycle.  2. Anemia: HGB 8.8 today. Iron studies and ferritin today are WNL. Monitor. 3. Thrombocytopenia: Platelets 91 today. Monitor 4. Hyperglycemia: Continue diabetic medications as prescribed. Monitor closely since patient will be receiving dexamethasone as a premedication. 5. Dizziness: Unclear etiology. Patient has appointment with her pcp in May 2017. 6. Peripheral Neuropathy: Monitor. 7. Leukopenia: WBC 1.5, ANC 800, approximately 7 days post consolidation treatment. Monitor  Patient expressed understanding and was in agreement with this plan. She also understands that She can call clinic at any time with any questions, concerns, or complaints.   Dr. Oliva Bustard was available for consultation and review of plan of care for this patient.  Evlyn Kanner, NP    04/13/2016 4:01 PM

## 2016-04-13 NOTE — Progress Notes (Signed)
Patient here today for follow up after receiving chemotherapy treatment last week. Patient reports she has not felt well since last treatment. Patient reports dizziness, weakness, fatigue, onset of bilateral knee pain over the weekend. Patient states knee pain is better, now just tingling. Neuropathy in hands has not worsened since last visit. Patient reports decreased appetite, was prescribed Megace at last visit but states she is not taking it at this time.

## 2016-04-27 ENCOUNTER — Inpatient Hospital Stay: Payer: PPO

## 2016-04-27 ENCOUNTER — Inpatient Hospital Stay (HOSPITAL_BASED_OUTPATIENT_CLINIC_OR_DEPARTMENT_OTHER): Payer: PPO | Admitting: Oncology

## 2016-04-27 VITALS — BP 100/71 | HR 98 | Temp 98.4°F | Resp 20 | Wt 228.0 lb

## 2016-04-27 DIAGNOSIS — D72819 Decreased white blood cell count, unspecified: Secondary | ICD-10-CM

## 2016-04-27 DIAGNOSIS — D649 Anemia, unspecified: Secondary | ICD-10-CM

## 2016-04-27 DIAGNOSIS — Z79899 Other long term (current) drug therapy: Secondary | ICD-10-CM

## 2016-04-27 DIAGNOSIS — C3412 Malignant neoplasm of upper lobe, left bronchus or lung: Secondary | ICD-10-CM

## 2016-04-27 DIAGNOSIS — G4700 Insomnia, unspecified: Secondary | ICD-10-CM

## 2016-04-27 DIAGNOSIS — Z923 Personal history of irradiation: Secondary | ICD-10-CM

## 2016-04-27 DIAGNOSIS — F17210 Nicotine dependence, cigarettes, uncomplicated: Secondary | ICD-10-CM

## 2016-04-27 DIAGNOSIS — Z5111 Encounter for antineoplastic chemotherapy: Secondary | ICD-10-CM | POA: Diagnosis not present

## 2016-04-27 DIAGNOSIS — D696 Thrombocytopenia, unspecified: Secondary | ICD-10-CM | POA: Diagnosis not present

## 2016-04-27 DIAGNOSIS — E1140 Type 2 diabetes mellitus with diabetic neuropathy, unspecified: Secondary | ICD-10-CM

## 2016-04-27 DIAGNOSIS — R42 Dizziness and giddiness: Secondary | ICD-10-CM

## 2016-04-27 DIAGNOSIS — G629 Polyneuropathy, unspecified: Secondary | ICD-10-CM

## 2016-04-27 DIAGNOSIS — R739 Hyperglycemia, unspecified: Secondary | ICD-10-CM

## 2016-04-27 LAB — CBC WITH DIFFERENTIAL/PLATELET
BASOS PCT: 1 %
Basophils Absolute: 0 10*3/uL (ref 0–0.1)
Eosinophils Absolute: 0 10*3/uL (ref 0–0.7)
Eosinophils Relative: 0 %
HEMATOCRIT: 23.4 % — AB (ref 35.0–47.0)
HEMOGLOBIN: 8.2 g/dL — AB (ref 12.0–16.0)
LYMPHS PCT: 16 %
Lymphs Abs: 0.5 10*3/uL — ABNORMAL LOW (ref 1.0–3.6)
MCH: 32.6 pg (ref 26.0–34.0)
MCHC: 34.9 g/dL (ref 32.0–36.0)
MCV: 93.5 fL (ref 80.0–100.0)
MONOS PCT: 12 %
Monocytes Absolute: 0.4 10*3/uL (ref 0.2–0.9)
NEUTROS ABS: 2.2 10*3/uL (ref 1.4–6.5)
NEUTROS PCT: 71 %
Platelets: 111 10*3/uL — ABNORMAL LOW (ref 150–440)
RBC: 2.5 MIL/uL — ABNORMAL LOW (ref 3.80–5.20)
RDW: 15.5 % — ABNORMAL HIGH (ref 11.5–14.5)
WBC: 3.1 10*3/uL — ABNORMAL LOW (ref 3.6–11.0)

## 2016-04-27 LAB — COMPREHENSIVE METABOLIC PANEL
ALBUMIN: 3.5 g/dL (ref 3.5–5.0)
ALK PHOS: 73 U/L (ref 38–126)
ALT: 15 U/L (ref 14–54)
AST: 20 U/L (ref 15–41)
Anion gap: 7 (ref 5–15)
BILIRUBIN TOTAL: 0.5 mg/dL (ref 0.3–1.2)
BUN: 16 mg/dL (ref 6–20)
CALCIUM: 8.8 mg/dL — AB (ref 8.9–10.3)
CO2: 25 mmol/L (ref 22–32)
Chloride: 107 mmol/L (ref 101–111)
Creatinine, Ser: 1 mg/dL (ref 0.44–1.00)
GFR calc Af Amer: >60 mL/min (ref >60)
GFR, EST NON AFRICAN AMERICAN: 57 mL/min — AB (ref >60)
GLUCOSE: 274 mg/dL — AB (ref 65–99)
Potassium: 3.9 mmol/L (ref 3.5–5.1)
Sodium: 139 mmol/L (ref 135–145)
TOTAL PROTEIN: 6.5 g/dL (ref 6.5–8.1)

## 2016-04-27 MED ORDER — FAMOTIDINE IN NACL 20-0.9 MG/50ML-% IV SOLN
20.0000 mg | Freq: Once | INTRAVENOUS | Status: AC
  Administered 2016-04-27: 20 mg via INTRAVENOUS

## 2016-04-27 MED ORDER — SODIUM CHLORIDE 0.9 % IV SOLN
10.0000 mg | Freq: Once | INTRAVENOUS | Status: AC
  Administered 2016-04-27: 10 mg via INTRAVENOUS
  Filled 2016-04-27: qty 1

## 2016-04-27 MED ORDER — HEPARIN SOD (PORK) LOCK FLUSH 100 UNIT/ML IV SOLN
500.0000 [IU] | Freq: Once | INTRAVENOUS | Status: AC | PRN
  Administered 2016-04-27: 500 [IU]
  Filled 2016-04-27: qty 5

## 2016-04-27 MED ORDER — DIPHENHYDRAMINE HCL 50 MG/ML IJ SOLN
25.0000 mg | Freq: Once | INTRAMUSCULAR | Status: AC
  Administered 2016-04-27: 25 mg via INTRAVENOUS
  Filled 2016-04-27: qty 1

## 2016-04-27 MED ORDER — SODIUM CHLORIDE 0.9 % IV SOLN
Freq: Once | INTRAVENOUS | Status: AC
  Administered 2016-04-27: 10:00:00 via INTRAVENOUS
  Filled 2016-04-27: qty 1000

## 2016-04-27 MED ORDER — SODIUM CHLORIDE 0.9 % IV SOLN
573.0000 mg | Freq: Once | INTRAVENOUS | Status: AC
  Administered 2016-04-27: 570 mg via INTRAVENOUS
  Filled 2016-04-27: qty 57

## 2016-04-27 MED ORDER — SODIUM CHLORIDE 0.9% FLUSH
10.0000 mL | INTRAVENOUS | Status: DC | PRN
  Administered 2016-04-27: 10 mL
  Filled 2016-04-27: qty 10

## 2016-04-27 MED ORDER — PALONOSETRON HCL INJECTION 0.25 MG/5ML
0.2500 mg | Freq: Once | INTRAVENOUS | Status: AC
  Administered 2016-04-27: 0.25 mg via INTRAVENOUS
  Filled 2016-04-27: qty 5

## 2016-04-27 MED ORDER — DEXTROSE 5 % IV SOLN
175.0000 mg/m2 | Freq: Once | INTRAVENOUS | Status: AC
  Administered 2016-04-27: 378 mg via INTRAVENOUS
  Filled 2016-04-27: qty 63

## 2016-04-27 MED ORDER — OXYCODONE HCL 10 MG PO TABS: 10.0000 mg | ORAL_TABLET | Freq: Four times a day (QID) | ORAL | Status: DC | PRN

## 2016-04-27 NOTE — Progress Notes (Signed)
Patient is having numbness with tingling in right fingers and left foot up to her knee.  Does have a decrease in appetite.  Still having dizziness and is not driving due to dizziness.  Her blood pressure is 100/71 today and she is taking Lisinopril '10mg'$  QD.  Having difficulty sleeping at night and would like to discuss what can take.

## 2016-04-27 NOTE — Progress Notes (Signed)
[facility]  Telephone:(336) [phone] Fax:(336) [phone]  ID: Susan Willis OB: 1951-07-22  MR#: [id number]  RWE#:[id number]  Patient Care Team: Marguerita Merles, MD as PCP - General (Family Medicine)  CHIEF COMPLAINT: Lung mass.  INTERVAL HISTORY: Patient returns to clinic today for further evaluation and consideration of cycle 2 of consolidation carboplatinum and Taxol. She continues to feel dizzy today. She has numbness in her right fingers and left foot that comes and goes and does not interfere with her daily activities and it is not painful. She is having difficulty sleeping at night. She still has pain in her knees, she takes pain medication as needed, usually 2-3 tabs daily. She has no other neurologic complaints. She denies any chest pain or shortness of breath. She denies constipation, diarrhea, nausea, or vomiting.  She denies any recent fevers or illnesses. She denies cough, or hemoptysis. She has no urinary complaints. Patient offers no further specific complaints today.  REVIEW OF SYSTEMS:   Review of Systems  Constitutional: Negative for fever, chills, weight loss, malaise/fatigue and diaphoresis.  HENT: Negative.   Eyes: Negative.   Respiratory: Negative for cough, hemoptysis, sputum production, shortness of breath and wheezing.   Cardiovascular: Negative for chest pain, palpitations, orthopnea, claudication, leg swelling and PND.  Gastrointestinal: Negative for heartburn, nausea, vomiting, abdominal pain, diarrhea, constipation, blood in stool and melena.  Genitourinary: Negative.   Musculoskeletal: Positive for joint pain. Negative for myalgias.  Skin: Negative.   Neurological: Positive for dizziness and sensory change. Negative for tingling, focal weakness, seizures and weakness.  Endo/Heme/Allergies: Does not bruise/bleed easily.  Psychiatric/Behavioral: Negative for depression. The patient has insomnia. The patient is not nervous/anxious.     As per  HPI. Otherwise, a complete review of systems is negatve.  PAST MEDICAL HISTORY: Past Medical History  Diagnosis Date  . Arthritis   . Diabetes mellitus without complication ([place])   . Hypertension   . High cholesterol   . Blood dyscrasia   . Diabetic neuropathy ([place])   . Lung cancer ([place])     PAST SURGICAL HISTORY: Past Surgical History  Procedure Laterality Date  . Abdominal hysterectomy    . Cesarean section    . Endobronchial ultrasound N/A 11/26/2015    Procedure: ENDOBRONCHIAL ULTRASOUND;  Surgeon: Flora Lipps, MD;  Location: [facility];  Service: Cardiopulmonary;  Laterality: N/A;  . Electromagnetic navigation brochoscopy N/A 11/26/2015    Procedure: ELECTROMAGNETIC NAVIGATION BRONCHOSCOPY;  Surgeon: Flora Lipps, MD;  Location: [facility];  Service: Cardiopulmonary;  Laterality: N/A;  . Peripheral vascular catheterization N/A 12/09/2015    Procedure: Glori Luis Cath Insertion;  Surgeon: Katha Cabal, MD;  Location: [facility];  Service: Cardiovascular;  Laterality: N/A;    FAMILY HISTORY: Reviewed and unchanged. No reported history of malignancy or chronic disease.     ADVANCED DIRECTIVES:    HEALTH MAINTENANCE: Social History  Substance Use Topics  . Smoking status: Current Every Day Smoker -- 0.30 packs/day for 35 years    Types: Cigarettes  . Smokeless tobacco: Never Used     Comment: workiing on quitting, down to 2 packs per week now  . Alcohol Use: No     No Known Allergies  Current Outpatient Prescriptions  Medication Sig Dispense Refill  . atorvastatin (LIPITOR) 20 MG tablet Take 20 mg by mouth daily at 6 PM.    . insulin glargine (LANTUS) 100 UNIT/ML injection Inject 60 Units into the skin at bedtime.    . lidocaine-prilocaine (EMLA)  cream Apply to affected area once 30 g 6  . linagliptin (TRADJENTA) 5 MG TABS tablet Take 5 mg by mouth daily.     Marland Kitchen lisinopril (PRINIVIL,ZESTRIL) 10 MG tablet Take 10 mg by mouth daily.    . megestrol (MEGACE) 40 MG  tablet     . ondansetron (ZOFRAN) 8 MG tablet Take 1 tablet (8 mg total) by mouth 2 (two) times daily. Start the day after chemo for 3 days. Then take as needed for nausea or vomiting. 30 tablet 3  . Oxycodone HCl 10 MG TABS Take 1 tablet (10 mg total) by mouth every 6 (six) hours as needed. 90 tablet 0  . prochlorperazine (COMPAZINE) 10 MG tablet Take 1 tablet (10 mg total) by mouth every 6 (six) hours as needed for nausea, vomiting or refractory nausea / vomiting (Nausea or vomiting). 30 tablet 3  . promethazine (PHENERGAN) 25 MG tablet Take 1 tablet (25 mg total) by mouth every 6 (six) hours as needed for nausea or vomiting. 30 tablet 0  . sucralfate (CARAFATE) 1 g tablet Take 1 tablet (1 g total) by mouth 3 (three) times daily. Dissolve each tablet in 2-3 tbsp of warm water, swish and swallow. 90 tablet 3  . metFORMIN (GLUMETZA) 500 MG (MOD) 24 hr tablet Take 1,000 mg by mouth 2 (two) times daily with a meal. Reported on 04/27/2016     No current facility-administered medications for this visit.   Facility-Administered Medications Ordered in Other Visits  Medication Dose Route Frequency Provider Last Rate Last Dose  . heparin lock flush 100 unit/mL  500 Units Intracatheter Once PRN [name], MD      . sodium chloride flush (NS) 0.9 % injection 10 mL  10 mL Intracatheter PRN [name], MD   10 mL at 04/27/16 0848    OBJECTIVE: Filed Vitals:   04/27/16 0856  BP: 100/71  Pulse: 98  Temp: 98.4 F (36.9 C)  Resp: 20     Body mass index is 39.11 kg/(m^2).    ECOG FS:1 - Symptomatic but completely ambulatory  General: Well-developed, well-nourished, no acute distress. Eyes: Pink conjunctiva, anicteric sclera. Lungs: Clear to auscultation bilaterally. Heart: Regular rate and rhythm. No rubs, murmurs, or gallops. Abdomen: Soft, nontender, nondistended. No organomegaly noted, normoactive bowel sounds. Musculoskeletal: No edema, cyanosis, or clubbing. Neuro: Alert,  answering all questions appropriately. Cranial nerves grossly intact. Skin: No rashes or petechiae noted. Psych: Normal affect.   LAB RESULTS:  Lab Results  Component Value Date   NA 139 04/27/2016   K 3.9 04/27/2016   CL 107 04/27/2016   CO2 25 04/27/2016   GLUCOSE 274* 04/27/2016   BUN 16 04/27/2016   CREATININE 1.00 04/27/2016   CALCIUM 8.8* 04/27/2016   PROT 6.5 04/27/2016   ALBUMIN 3.5 04/27/2016   AST 20 04/27/2016   ALT 15 04/27/2016   ALKPHOS 73 04/27/2016   BILITOT 0.5 04/27/2016   GFRNONAA 57* 04/27/2016   GFRAA >60 04/27/2016    Lab Results  Component Value Date   WBC 3.1* 04/27/2016   NEUTROABS 2.2 04/27/2016   HGB 8.2* 04/27/2016   HCT 23.4* 04/27/2016   MCV 93.5 04/27/2016   PLT 111* 04/27/2016   Lab Results  Component Value Date   IRON 63 03/30/2016   TIBC 290 03/30/2016   IRONPCTSAT 22 03/30/2016    Lab Results  Component Value Date   FERRITIN 126 03/30/2016     STUDIES: No results found.  ASSESSMENT: Stage IIIa  squamous cell carcinoma of the lung.   PLAN:    1. Lung mass: Finished XRT on March 07, 2016. She finished 9 cycles of concurrent taxol/carbo. She has completed cycle 1 consolidation taxol/carbo and appeared to tolerate fairly well. Proceed with cycle 2 today. She will have a pet scan in 8 weeks and then follow up in clinic for results.  2. Anemia: HGB 8.2 today. Iron studies and ferritin are WNL. Monitor. 3. Thrombocytopenia: Platelets 111 today. Monitor 4. Hyperglycemia: Continue diabetic medications as prescribed. Monitor closely since patient will be receiving dexamethasone as a premedication. 5. Dizziness: Possibly due to her consistent low blood pressure. Patient has appointment with her pcp in June 2017 To discuss adjusting her blood pressure medications. 6. Peripheral Neuropathy: Monitor. 7. Leukopenia: WBC 3.1, ANC 2200. Proceed with treatment as above. Monitor 8. Insomnia: OTC sleep aid PRN. 9. Decreased appetite:  Frequent small meals. Patient has megace but it makes her feel more dizzy so she hasn't been taking it. 10. Pain: Continue with PRN Oxycodone 10 mg. Prescription for #90 given today.   Patient expressed understanding and was in agreement with this plan. She also understands that She can call clinic at any time with any questions, concerns, or complaints.    Mayra Reel, NP   04/27/2016 9:32 AM   Patient was seen and evaluated independently and I agree with the assessment and plan as dictated above.  [name], MD 04/27/2016 12:37 PM

## 2016-05-16 ENCOUNTER — Telehealth: Payer: Self-pay | Admitting: *Deleted

## 2016-05-16 NOTE — Telephone Encounter (Signed)
Complains of continued and worsened numbness in the tips of her fingers on the right hand. States it is driving her crazy, cannot feel anything. Please advise

## 2016-05-17 MED ORDER — GABAPENTIN 300 MG PO CAPS: 300.0000 mg | ORAL_CAPSULE | Freq: Every day | ORAL | Status: DC

## 2016-05-17 NOTE — Telephone Encounter (Signed)
Try gabapentin.  Start '300mg'$  at night and increase from there.

## 2016-05-17 NOTE — Telephone Encounter (Signed)
Rx e scribed, patient informed

## 2016-05-23 ENCOUNTER — Other Ambulatory Visit: Payer: Self-pay | Admitting: *Deleted

## 2016-05-23 MED ORDER — OXYCODONE HCL 10 MG PO TABS: 10.0000 mg | ORAL_TABLET | Freq: Four times a day (QID) | ORAL | Status: DC | PRN

## 2016-06-22 ENCOUNTER — Inpatient Hospital Stay: Payer: PPO | Attending: Oncology

## 2016-06-22 ENCOUNTER — Ambulatory Visit
Admission: RE | Admit: 2016-06-22 | Discharge: 2016-06-22 | Disposition: A | Discharge status: Home / Self Care | Payer: PPO | Source: Ambulatory Visit | Attending: Oncology | Admitting: Oncology

## 2016-06-22 ENCOUNTER — Other Ambulatory Visit: Payer: Self-pay | Admitting: *Deleted

## 2016-06-22 ENCOUNTER — Telehealth: Payer: Self-pay | Admitting: *Deleted

## 2016-06-22 DIAGNOSIS — E7800 Pure hypercholesterolemia, unspecified: Secondary | ICD-10-CM | POA: Insufficient documentation

## 2016-06-22 DIAGNOSIS — Z9221 Personal history of antineoplastic chemotherapy: Secondary | ICD-10-CM | POA: Insufficient documentation

## 2016-06-22 DIAGNOSIS — Z79899 Other long term (current) drug therapy: Secondary | ICD-10-CM | POA: Insufficient documentation

## 2016-06-22 DIAGNOSIS — F17210 Nicotine dependence, cigarettes, uncomplicated: Secondary | ICD-10-CM | POA: Insufficient documentation

## 2016-06-22 DIAGNOSIS — I2510 Atherosclerotic heart disease of native coronary artery without angina pectoris: Secondary | ICD-10-CM | POA: Insufficient documentation

## 2016-06-22 DIAGNOSIS — M799 Soft tissue disorder, unspecified: Secondary | ICD-10-CM | POA: Diagnosis not present

## 2016-06-22 DIAGNOSIS — G629 Polyneuropathy, unspecified: Secondary | ICD-10-CM | POA: Insufficient documentation

## 2016-06-22 DIAGNOSIS — I700 Atherosclerosis of aorta: Secondary | ICD-10-CM | POA: Diagnosis not present

## 2016-06-22 DIAGNOSIS — R630 Anorexia: Secondary | ICD-10-CM | POA: Insufficient documentation

## 2016-06-22 DIAGNOSIS — I10 Essential (primary) hypertension: Secondary | ICD-10-CM | POA: Insufficient documentation

## 2016-06-22 DIAGNOSIS — C3412 Malignant neoplasm of upper lobe, left bronchus or lung: Secondary | ICD-10-CM | POA: Insufficient documentation

## 2016-06-22 DIAGNOSIS — M533 Sacrococcygeal disorders, not elsewhere classified: Secondary | ICD-10-CM | POA: Diagnosis not present

## 2016-06-22 DIAGNOSIS — M1990 Unspecified osteoarthritis, unspecified site: Secondary | ICD-10-CM | POA: Insufficient documentation

## 2016-06-22 DIAGNOSIS — Z923 Personal history of irradiation: Secondary | ICD-10-CM | POA: Insufficient documentation

## 2016-06-22 DIAGNOSIS — E119 Type 2 diabetes mellitus without complications: Secondary | ICD-10-CM | POA: Insufficient documentation

## 2016-06-22 DIAGNOSIS — D72819 Decreased white blood cell count, unspecified: Secondary | ICD-10-CM | POA: Insufficient documentation

## 2016-06-22 DIAGNOSIS — D649 Anemia, unspecified: Secondary | ICD-10-CM | POA: Insufficient documentation

## 2016-06-22 DIAGNOSIS — D696 Thrombocytopenia, unspecified: Secondary | ICD-10-CM | POA: Insufficient documentation

## 2016-06-22 DIAGNOSIS — C3491 Malignant neoplasm of unspecified part of right bronchus or lung: Secondary | ICD-10-CM | POA: Diagnosis not present

## 2016-06-22 DIAGNOSIS — R42 Dizziness and giddiness: Secondary | ICD-10-CM | POA: Insufficient documentation

## 2016-06-22 DIAGNOSIS — G893 Neoplasm related pain (acute) (chronic): Secondary | ICD-10-CM | POA: Insufficient documentation

## 2016-06-22 LAB — GLUCOSE, CAPILLARY: GLUCOSE-CAPILLARY: 191 mg/dL — AB (ref 65–99)

## 2016-06-22 MED ORDER — FLUDEOXYGLUCOSE F - 18 (FDG) INJECTION
12.8200 | Freq: Once | INTRAVENOUS | Status: AC | PRN
  Administered 2016-06-22: 12.82 via INTRAVENOUS

## 2016-06-22 MED ORDER — OXYCODONE HCL 10 MG PO TABS: 10.0000 mg | ORAL_TABLET | Freq: Four times a day (QID) | ORAL | Status: DC | PRN

## 2016-06-22 NOTE — Telephone Encounter (Signed)
Patient called in to request refill of oxycodone. Prescription will be ready for pick up this morning.

## 2016-06-27 ENCOUNTER — Inpatient Hospital Stay (HOSPITAL_BASED_OUTPATIENT_CLINIC_OR_DEPARTMENT_OTHER): Payer: PPO | Admitting: Oncology

## 2016-06-27 ENCOUNTER — Inpatient Hospital Stay: Payer: PPO

## 2016-06-27 VITALS — BP 123/80 | HR 86 | Temp 95.3°F | Resp 18 | Wt 222.0 lb

## 2016-06-27 DIAGNOSIS — C801 Malignant (primary) neoplasm, unspecified: Secondary | ICD-10-CM

## 2016-06-27 DIAGNOSIS — R630 Anorexia: Secondary | ICD-10-CM

## 2016-06-27 DIAGNOSIS — Z9221 Personal history of antineoplastic chemotherapy: Secondary | ICD-10-CM | POA: Diagnosis not present

## 2016-06-27 DIAGNOSIS — G893 Neoplasm related pain (acute) (chronic): Secondary | ICD-10-CM

## 2016-06-27 DIAGNOSIS — D72819 Decreased white blood cell count, unspecified: Secondary | ICD-10-CM

## 2016-06-27 DIAGNOSIS — E7800 Pure hypercholesterolemia, unspecified: Secondary | ICD-10-CM | POA: Diagnosis not present

## 2016-06-27 DIAGNOSIS — F17210 Nicotine dependence, cigarettes, uncomplicated: Secondary | ICD-10-CM | POA: Diagnosis not present

## 2016-06-27 DIAGNOSIS — G629 Polyneuropathy, unspecified: Secondary | ICD-10-CM | POA: Diagnosis not present

## 2016-06-27 DIAGNOSIS — E119 Type 2 diabetes mellitus without complications: Secondary | ICD-10-CM | POA: Diagnosis not present

## 2016-06-27 DIAGNOSIS — Z79899 Other long term (current) drug therapy: Secondary | ICD-10-CM

## 2016-06-27 DIAGNOSIS — Z923 Personal history of irradiation: Secondary | ICD-10-CM

## 2016-06-27 DIAGNOSIS — D696 Thrombocytopenia, unspecified: Secondary | ICD-10-CM

## 2016-06-27 DIAGNOSIS — C3412 Malignant neoplasm of upper lobe, left bronchus or lung: Secondary | ICD-10-CM

## 2016-06-27 DIAGNOSIS — M1990 Unspecified osteoarthritis, unspecified site: Secondary | ICD-10-CM | POA: Diagnosis not present

## 2016-06-27 DIAGNOSIS — I10 Essential (primary) hypertension: Secondary | ICD-10-CM | POA: Diagnosis not present

## 2016-06-27 DIAGNOSIS — D649 Anemia, unspecified: Secondary | ICD-10-CM | POA: Diagnosis not present

## 2016-06-27 DIAGNOSIS — R42 Dizziness and giddiness: Secondary | ICD-10-CM | POA: Diagnosis not present

## 2016-06-27 MED ORDER — SODIUM CHLORIDE 0.9% FLUSH
10.0000 mL | INTRAVENOUS | Status: DC | PRN
  Administered 2016-06-27: 10 mL via INTRAVENOUS
  Filled 2016-06-27: qty 10

## 2016-06-27 MED ORDER — HEPARIN SOD (PORK) LOCK FLUSH 100 UNIT/ML IV SOLN
500.0000 [IU] | Freq: Once | INTRAVENOUS | Status: AC
  Administered 2016-06-27: 500 [IU] via INTRAVENOUS

## 2016-06-27 NOTE — Progress Notes (Signed)
Feeling dizzy today and continues to have numbness in both hands.

## 2016-06-27 NOTE — Progress Notes (Signed)
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  INTERVAL HISTORY: Patient returns to clinic today for further evaluation and discussion of her PET scan results. She continues to have peripheral neuropathy, there is only mildly helped with gabapentin. She otherwise feels well. She has no other neurologic complaints. She denies any recent fevers or illnesses. She denies any chest pain or shortness of breath. She denies constipation, diarrhea, nausea, or vomiting.  She has no urinary complaints. Patient offers no further specific complaints today.  REVIEW OF SYSTEMS:   Review of Systems  Constitutional: Negative.  Negative for fever, malaise/fatigue and weight loss.  Respiratory: Negative.  Negative for cough, hemoptysis and shortness of breath.   Cardiovascular: Negative.  Negative for chest pain.  Gastrointestinal: Negative.  Negative for abdominal pain.  Genitourinary: Negative.   Musculoskeletal: Positive for joint pain.  Neurological: Positive for sensory change. Negative for weakness.  Psychiatric/Behavioral: Negative.     As per HPI. Otherwise, a complete review of systems is negatve.  PAST MEDICAL HISTORY: Past Medical History:  Diagnosis Date  . Arthritis   . Blood dyscrasia   . Diabetes mellitus without complication ([place])   . Diabetic neuropathy ([place])   . High cholesterol   . Hypertension   . Lung cancer ([place])     PAST SURGICAL HISTORY: Past Surgical History:  Procedure Laterality Date  . ABDOMINAL HYSTERECTOMY    . CESAREAN SECTION    . ELECTROMAGNETIC NAVIGATION BROCHOSCOPY N/A 11/26/2015   Procedure: ELECTROMAGNETIC NAVIGATION BRONCHOSCOPY;  Surgeon: Flora Lipps, MD;  Location: [facility] ORS;  Service:  Cardiopulmonary;  Laterality: N/A;  . ENDOBRONCHIAL ULTRASOUND N/A 11/26/2015   Procedure: ENDOBRONCHIAL ULTRASOUND;  Surgeon: Flora Lipps, MD;  Location: [facility] ORS;  Service: Cardiopulmonary;  Laterality: N/A;  . PERIPHERAL VASCULAR CATHETERIZATION N/A 12/09/2015   Procedure: Glori Luis Cath Insertion;  Surgeon: Katha Cabal, MD;  Location: [place] [facility];  Service: Cardiovascular;  Laterality: N/A;    FAMILY HISTORY: Reviewed and unchanged. No reported history of malignancy or chronic disease.     ADVANCED DIRECTIVES:    HEALTH MAINTENANCE: Social History  Substance Use Topics  . Smoking status: Current Every Day Smoker    Packs/day: 0.30    Years: 35.00    Types: Cigarettes  . Smokeless tobacco: Never Used     Comment: workiing on quitting, down to 2 packs per week now  . Alcohol use No     No Known Allergies  Current Outpatient Prescriptions  Medication Sig Dispense Refill  . atorvastatin (LIPITOR) 20 MG tablet Take 20 mg by mouth daily at 6 PM.    . gabapentin (NEURONTIN) 300 MG capsule Take 1 capsule (300 mg total) by mouth at bedtime. 30 capsule 0  . insulin glargine (LANTUS) 100 UNIT/ML injection Inject 60 Units into the skin at bedtime.    . lidocaine-prilocaine (EMLA) cream Apply to affected area once 30 g 6  . linagliptin (TRADJENTA) 5 MG TABS tablet Take 5 mg by mouth daily.     Marland Kitchen lisinopril (PRINIVIL,ZESTRIL) 10 MG tablet Take 10 mg by mouth daily.    . metFORMIN (GLUMETZA) 500 MG (MOD) 24 hr tablet Take 1,000 mg by mouth 2 (two) times daily with a meal. Reported on 04/27/2016    .  ondansetron (ZOFRAN) 8 MG tablet Take 1 tablet (8 mg total) by mouth 2 (two) times daily. Start the day after chemo for 3 days. Then take as needed for nausea or vomiting. 30 tablet 3  . Oxycodone HCl 10 MG TABS Take 1 tablet (10 mg total) by mouth every 6 (six) hours as needed. 90 tablet 0  . prochlorperazine (COMPAZINE) 10 MG tablet Take 1 tablet (10 mg total) by mouth every 6 (six)  hours as needed for nausea, vomiting or refractory nausea / vomiting (Nausea or vomiting). 30 tablet 3  . promethazine (PHENERGAN) 25 MG tablet Take 1 tablet (25 mg total) by mouth every 6 (six) hours as needed for nausea or vomiting. 30 tablet 0   No current facility-administered medications for this visit.     OBJECTIVE: Vitals:   06/27/16 0931  BP: 123/80  Pulse: 86  Resp: 18  Temp: (!) 95.3 F (35.2 C)     Body mass index is 38.11 kg/m.    ECOG FS:1 - Symptomatic but completely ambulatory  General: Well-developed, well-nourished, no acute distress. Eyes: Pink conjunctiva, anicteric sclera. Lungs: Clear to auscultation bilaterally. Heart: Regular rate and rhythm. No rubs, murmurs, or gallops. Abdomen: Soft, nontender, nondistended. No organomegaly noted, normoactive bowel sounds. Musculoskeletal: No edema, cyanosis, or clubbing. Neuro: Alert, answering all questions appropriately. Cranial nerves grossly intact. Skin: No rashes or petechiae noted. Psych: Normal affect.   LAB RESULTS:  Lab Results  Component Value Date   NA 139 04/27/2016   K 3.9 04/27/2016   CL 107 04/27/2016   CO2 25 04/27/2016   GLUCOSE 274 (H) 04/27/2016   BUN 16 04/27/2016   CREATININE 1.00 04/27/2016   CALCIUM 8.8 (L) 04/27/2016   PROT 6.5 04/27/2016   ALBUMIN 3.5 04/27/2016   AST 20 04/27/2016   ALT 15 04/27/2016   ALKPHOS 73 04/27/2016   BILITOT 0.5 04/27/2016   GFRNONAA 57 (L) 04/27/2016   GFRAA >60 04/27/2016    Lab Results  Component Value Date   WBC 3.1 (L) 04/27/2016   NEUTROABS 2.2 04/27/2016   HGB 8.2 (L) 04/27/2016   HCT 23.4 (L) 04/27/2016   MCV 93.5 04/27/2016   PLT 111 (L) 04/27/2016   Lab Results  Component Value Date   IRON 63 03/30/2016   TIBC 290 03/30/2016   IRONPCTSAT 22 03/30/2016    Lab Results  Component Value Date   FERRITIN 126 03/30/2016     STUDIES: Nm Pet Image Restag (ps) Skull Base To Thigh  Result Date: 06/22/2016 CLINICAL DATA:   Subsequent treatment strategy for lung cancer. EXAM: NUCLEAR MEDICINE PET SKULL BASE TO THIGH TECHNIQUE: 12.8 mCi F-18 FDG was injected intravenously. Full-ring PET imaging was performed from the skull base to thigh after the radiotracer. CT data was obtained and used for attenuation correction and anatomic localization. FASTING BLOOD GLUCOSE:  Value: 191 mg/dl COMPARISON:  11/27/2015. FINDINGS: NECK No hypermetabolic lymph nodes in the neck. CT images show no acute findings. CHEST Residual soft tissue in the AP window/ left hilum measures approximately 1.1 x 2.0 cm (CT image 78) with hypermetabolism minimally above blood pool, SUV max 3.7. No additional hypermetabolic lymph nodes or hypermetabolic pulmonary nodules. Right IJ Port-A-Cath terminates at the SVC RA junction. Coronary artery calcification. Heart is at the upper limits of normal in size. No pericardial or pleural effusion. Upper lung zone predominant peribronchovascular nodularity is unchanged. ABDOMEN/PELVIS No abnormal hypermetabolism in the liver, adrenal glands, spleen or pancreas. No hypermetabolic lymph nodes. There is  mild hypermetabolism associated with a subcutaneous ill-defined soft tissue lesion with overlying ulceration, measuring 2.6 x 3.3 cm, new from the prior exam. Liver, gallbladder, adrenal glands, kidneys, spleen, pancreas, stomach and bowel are otherwise grossly unremarkable. No free fluid. Small periumbilical hernia contains fat and unobstructed small bowel. SKELETON No abnormal osseous hypermetabolism. IMPRESSION: 1. Marked response to therapy with minimal residual soft tissue in the AP window/left hilum and hypermetabolism minimally above blood pool. Previously described left upper lobe nodule has resolved as well. 2. Aortic atherosclerosis and coronary artery calcification. 3. Mild hypermetabolism associated with a subcutaneous soft tissue lesion and overlying ulceration overlying the right sacroiliac joint, presumably iatrogenic  or infectious/inflammatory in etiology. Electronically Signed   By: Lorin Picket M.D.   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 She finished 9 cycles of concurrent taxol/carbo. She has completed cycle 1 consolidation taxol/carbo and appeared to tolerate fairly well. Proceed with cycle 2 today. She will have a pet scan in 8 weeks and then follow up in clinic for results.  2. Anemia: HGB 8.2 today. Iron studies and ferritin are WNL. Monitor. 3. Thrombocytopenia: Platelets 111 today. Monitor 4. Hyperglycemia: Continue diabetic medications as prescribed. Monitor closely since patient will be receiving dexamethasone as a premedication. 5. Dizziness: Possibly due to her consistent low blood pressure. Patient has appointment with her pcp in June 2017 To discuss adjusting her blood pressure medications. 6. Peripheral Neuropathy: Monitor. 7. Leukopenia: WBC 3.1, ANC 2200. Proceed with treatment as above. Monitor 8. Insomnia: OTC sleep aid PRN. 9. Decreased appetite: Frequent small meals. Patient has megace but it makes her feel more dizzy so she hasn't been taking it. 10. Pain: Continue with PRN Oxycodone 10 mg. Prescription for #90 given today.   Patient expressed understanding and was in agreement with this plan. She also understands that She can call clinic at any time with any questions, concerns, or complaints.    [name], MD   06/27/2016 9:48 AM                        [place]  [phone]) [phone] Fax:(336) [phone]  ID: Susan Willis OB: 07-07-1951  MR#: [id number]  ERX#:[id number]  Patient Care Team: Marguerita Merles, MD as PCP - General (Family Medicine)  CHIEF COMPLAINT: Lung mass.  INTERVAL HISTORY: Patient returns to clinic today for further evaluation and  consideration of cycle 2 of consolidation carboplatinum and Taxol. She continues to feel dizzy today. She has numbness in her right fingers and left foot that comes and goes and does not interfere with her daily activities and it is not painful. She is having difficulty sleeping at night. She still has pain in her knees, she takes pain medication as needed, usually 2-3 tabs daily. She has no other neurologic complaints. She denies any chest pain or shortness of breath. She denies constipation, diarrhea, nausea, or vomiting.  She denies any recent fevers or illnesses. She denies cough, or hemoptysis. She has no urinary complaints. Patient offers no further specific complaints today.  REVIEW OF SYSTEMS:   Review of Systems  Constitutional: Negative for fever, chills, weight loss, malaise/fatigue and diaphoresis.  HENT: Negative.   Eyes: Negative.   Respiratory: Negative for cough, hemoptysis, sputum  production, shortness of breath and wheezing.   Cardiovascular: Negative for chest pain, palpitations, orthopnea, claudication, leg swelling and PND.  Gastrointestinal: Negative for heartburn, nausea, vomiting, abdominal pain, diarrhea, constipation, blood in stool and melena.  Genitourinary: Negative.   Musculoskeletal: Positive for joint pain. Negative for myalgias.  Skin: Negative.   Neurological: Positive for dizziness and sensory change. Negative for tingling, focal weakness, seizures and weakness.  Endo/Heme/Allergies: Does not bruise/bleed easily.  Psychiatric/Behavioral: Negative for depression. The patient has insomnia. The patient is not nervous/anxious.     As per HPI. Otherwise, a complete review of systems is negatve.  PAST MEDICAL HISTORY: Past Medical History:  Diagnosis Date  . Arthritis   . Blood dyscrasia   . Diabetes mellitus without complication ([place])   . Diabetic neuropathy ([place])   . High cholesterol   . Hypertension   . Lung cancer ([place])     PAST SURGICAL HISTORY: Past  Surgical History:  Procedure Laterality Date  . ABDOMINAL HYSTERECTOMY    . CESAREAN SECTION    . ELECTROMAGNETIC NAVIGATION BROCHOSCOPY N/A 11/26/2015   Procedure: ELECTROMAGNETIC NAVIGATION BRONCHOSCOPY;  Surgeon: Flora Lipps, MD;  Location: [facility];  Service: Cardiopulmonary;  Laterality: N/A;  . ENDOBRONCHIAL ULTRASOUND N/A 11/26/2015   Procedure: ENDOBRONCHIAL ULTRASOUND;  Surgeon: Flora Lipps, MD;  Location: [facility];  Service: Cardiopulmonary;  Laterality: N/A;  . PERIPHERAL VASCULAR CATHETERIZATION N/A 12/09/2015   Procedure: Glori Luis Cath Insertion;  Surgeon: Katha Cabal, MD;  Location: [place] [facility];  Service: Cardiovascular;  Laterality: N/A;    FAMILY HISTORY: Reviewed and unchanged. No reported history of malignancy or chronic disease.     ADVANCED DIRECTIVES:    HEALTH MAINTENANCE: Social History  Substance Use Topics  . Smoking status: Current Every Day Smoker    Packs/day: 0.30    Years: 35.00    Types: Cigarettes  . Smokeless tobacco: Never Used     Comment: workiing on quitting, down to 2 packs per week now  . Alcohol use No     No Known Allergies  Current Outpatient Prescriptions  Medication Sig Dispense Refill  . atorvastatin (LIPITOR) 20 MG tablet Take 20 mg by mouth daily at 6 PM.    . gabapentin (NEURONTIN) 300 MG capsule Take 1 capsule (300 mg total) by mouth at bedtime. 30 capsule 0  . insulin glargine (LANTUS) 100 UNIT/ML injection Inject 60 Units into the skin at bedtime.    . lidocaine-prilocaine (EMLA) cream Apply to affected area once 30 g 6  . linagliptin (TRADJENTA) 5 MG TABS tablet Take 5 mg by mouth daily.     Marland Kitchen lisinopril (PRINIVIL,ZESTRIL) 10 MG tablet Take 10 mg by mouth daily.    . metFORMIN (GLUMETZA) 500 MG (MOD) 24 hr tablet Take 1,000 mg by mouth 2 (two) times daily with a meal. Reported on 04/27/2016    . ondansetron (ZOFRAN) 8 MG tablet Take 1 tablet (8 mg total) by mouth 2 (two) times daily. Start the day after chemo for 3  days. Then take as needed for nausea or vomiting. 30 tablet 3  . Oxycodone HCl 10 MG TABS Take 1 tablet (10 mg total) by mouth every 6 (six) hours as needed. 90 tablet 0  . prochlorperazine (COMPAZINE) 10 MG tablet Take 1 tablet (10 mg total) by mouth every 6 (six) hours as needed for nausea, vomiting or refractory nausea / vomiting (Nausea or vomiting). 30 tablet 3  . promethazine (PHENERGAN) 25 MG tablet Take 1 tablet (25 mg total)  by mouth every 6 (six) hours as needed for nausea or vomiting. 30 tablet 0   No current facility-administered medications for this visit.     OBJECTIVE: Vitals:   06/27/16 0931  BP: 123/80  Pulse: 86  Resp: 18  Temp: (!) 95.3 F (35.2 C)     Body mass index is 38.11 kg/m.    ECOG FS:1 - Symptomatic but completely ambulatory  General: Well-developed, well-nourished, no acute distress. Eyes: Pink conjunctiva, anicteric sclera. Lungs: Clear to auscultation bilaterally. Heart: Regular rate and rhythm. No rubs, murmurs, or gallops. Abdomen: Soft, nontender, nondistended. No organomegaly noted, normoactive bowel sounds. Musculoskeletal: No edema, cyanosis, or clubbing. Neuro: Alert, answering all questions appropriately. Cranial nerves grossly intact. Skin: No rashes or petechiae noted. Psych: Normal affect.   LAB RESULTS:  Lab Results  Component Value Date   NA 139 04/27/2016   K 3.9 04/27/2016   CL 107 04/27/2016   CO2 25 04/27/2016   GLUCOSE 274 (H) 04/27/2016   BUN 16 04/27/2016   CREATININE 1.00 04/27/2016   CALCIUM 8.8 (L) 04/27/2016   PROT 6.5 04/27/2016   ALBUMIN 3.5 04/27/2016   AST 20 04/27/2016   ALT 15 04/27/2016   ALKPHOS 73 04/27/2016   BILITOT 0.5 04/27/2016   GFRNONAA 57 (L) 04/27/2016   GFRAA >60 04/27/2016    Lab Results  Component Value Date   WBC 3.1 (L) 04/27/2016   NEUTROABS 2.2 04/27/2016   HGB 8.2 (L) 04/27/2016   HCT 23.4 (L) 04/27/2016   MCV 93.5 04/27/2016   PLT 111 (L) 04/27/2016   Lab Results  Component  Value Date   IRON 63 03/30/2016   TIBC 290 03/30/2016   IRONPCTSAT 22 03/30/2016    Lab Results  Component Value Date   FERRITIN 126 03/30/2016     STUDIES: Nm Pet Image Restag (ps) Skull Base To Thigh  Result Date: 06/22/2016 CLINICAL DATA:  Subsequent treatment strategy for lung cancer. EXAM: NUCLEAR MEDICINE PET SKULL BASE TO THIGH TECHNIQUE: 12.8 mCi F-18 FDG was injected intravenously. Full-ring PET imaging was performed from the skull base to thigh after the radiotracer. CT data was obtained and used for attenuation correction and anatomic localization. FASTING BLOOD GLUCOSE:  Value: 191 mg/dl COMPARISON:  11/27/2015. FINDINGS: NECK No hypermetabolic lymph nodes in the neck. CT images show no acute findings. CHEST Residual soft tissue in the AP window/ left hilum measures approximately 1.1 x 2.0 cm (CT image 78) with hypermetabolism minimally above blood pool, SUV max 3.7. No additional hypermetabolic lymph nodes or hypermetabolic pulmonary nodules. Right IJ Port-A-Cath terminates at the SVC RA junction. Coronary artery calcification. Heart is at the upper limits of normal in size. No pericardial or pleural effusion. Upper lung zone predominant peribronchovascular nodularity is unchanged. ABDOMEN/PELVIS No abnormal hypermetabolism in the liver, adrenal glands, spleen or pancreas. No hypermetabolic lymph nodes. There is mild hypermetabolism associated with a subcutaneous ill-defined soft tissue lesion with overlying ulceration, measuring 2.6 x 3.3 cm, new from the prior exam. Liver, gallbladder, adrenal glands, kidneys, spleen, pancreas, stomach and bowel are otherwise grossly unremarkable. No free fluid. Small periumbilical hernia contains fat and unobstructed small bowel. SKELETON No abnormal osseous hypermetabolism. IMPRESSION: 1. Marked response to therapy with minimal residual soft tissue in the AP window/left hilum and hypermetabolism minimally above blood pool. Previously described left  upper lobe nodule has resolved as well. 2. Aortic atherosclerosis and coronary artery calcification. 3. Mild hypermetabolism associated with a subcutaneous soft tissue lesion and overlying ulceration overlying the  right sacroiliac joint, presumably iatrogenic or infectious/inflammatory in etiology. Electronically Signed   By: Lorin Picket M.D.   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  PLAN:    1. 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 PET scan results reviewed independently and reported as above with marked response to therapy with minimal residual soft tissue abnormality. No further intervention is needed at this time. Return to clinic in 3 months with repeat imaging with CT scan and further evaluation.  2. Pancytopenia: Secondary to chemotherapy, monitor. Previously, patient's iron stores were within normal limits.  3. Hyperglycemia: Patient's blood glucose remained persistently elevated. Continue current diabetic medications as prescribed.  4. Peripheral neuropathy: Secondary to Taxol. Continue gabapentin as prescribed. 5. Insomnia: Continue over-the-counter sleep aid as needed. 6. Pain: Patient does not complain of this today. Continue oxycodone as needed.     Patient expressed understanding and was in agreement with this plan. She also understands that She can call clinic at any time with any questions, concerns, or complaints.    [name], MD   06/28/2016 11:45 PM

## 2016-07-14 ENCOUNTER — Other Ambulatory Visit: Payer: Self-pay | Admitting: Nurse Practitioner

## 2016-07-20 ENCOUNTER — Other Ambulatory Visit: Payer: Self-pay | Admitting: *Deleted

## 2016-07-20 MED ORDER — OXYCODONE HCL 10 MG PO TABS: 10.0000 mg | ORAL_TABLET | Freq: Four times a day (QID) | ORAL | 0 refills | Status: DC | PRN

## 2016-08-10 ENCOUNTER — Encounter: Payer: Self-pay | Admitting: Radiation Oncology

## 2016-08-10 ENCOUNTER — Ambulatory Visit
Admission: RE | Admit: 2016-08-10 | Discharge: 2016-08-10 | Disposition: A | Discharge status: Home / Self Care | Payer: PPO | Source: Ambulatory Visit | Attending: Radiation Oncology | Admitting: Radiation Oncology

## 2016-08-10 VITALS — BP 119/75 | HR 69 | Temp 97.8°F | Resp 19 | Wt 222.8 lb

## 2016-08-10 DIAGNOSIS — C3412 Malignant neoplasm of upper lobe, left bronchus or lung: Secondary | ICD-10-CM | POA: Diagnosis not present

## 2016-08-10 DIAGNOSIS — F17210 Nicotine dependence, cigarettes, uncomplicated: Secondary | ICD-10-CM | POA: Diagnosis not present

## 2016-08-10 DIAGNOSIS — Z923 Personal history of irradiation: Secondary | ICD-10-CM | POA: Diagnosis not present

## 2016-08-10 DIAGNOSIS — C3490 Malignant neoplasm of unspecified part of unspecified bronchus or lung: Secondary | ICD-10-CM | POA: Diagnosis not present

## 2016-08-10 NOTE — Progress Notes (Signed)
Radiation Oncology Follow up Note  Name: Susan Willis   Date:   08/10/2016 MRN:  [id number] DOB: 18-Feb-1951    This 65 y.o. female presents to the clinic today for four-month follow-up status post IMRT with concurrent chemotherapy for stage IIIa non-small cell lung cancer.  REFERRING PROVIDER: Marguerita Merles, MD  HPI: Patient is a 65 year old female now out 4 months having completed IM RT radiation therapy with concurrent chemotherapy for stage IIIa non-small cell lung cancer. Tumor was a T4 N2 M0 lesion. Seen today in routine follow-up she is doing fairly well although complains of generalized weakness and malaise. She's had peripheral neuropathy from her prior chemotherapy with a minor benefit from gabapentin. She specifically denies cough hemoptysis chest tightness or dyspnea on exertion.. She had a PET CT scan July 19 showing marked response to therapy with only minimal residual soft tissue in the AP window. She did have pancytopenia secondary to chemotherapy which may account for some of her persistent weakness. She also complains of occasional sharp pains in her chest may be secondary to scar tissue from her prior treatments.  COMPLICATIONS OF TREATMENT: none  FOLLOW UP COMPLIANCE: keeps appointments   PHYSICAL EXAM:  BP 119/75   Pulse 69   Temp 97.8 F (36.6 C)   Resp 19   Wt 222 lb 12.4 oz (101.1 kg)   BMI 38.24 kg/m  Well-developed well-nourished patient in NAD. HEENT reveals PERLA, EOMI, discs not visualized.  Oral cavity is clear. No oral mucosal lesions are identified. Neck is clear without evidence of cervical or supraclavicular adenopathy. Lungs are clear to A&P. Cardiac examination is essentially unremarkable with regular rate and rhythm without murmur rub or thrill. Abdomen is benign with no organomegaly or masses noted. Motor sensory and DTR levels are equal and symmetric in the upper and lower extremities. Cranial nerves II through XII are grossly intact. Proprioception is  intact. No peripheral adenopathy or edema is identified. No motor or sensory levels are noted. Crude visual fields are within normal range.  RADIOLOGY RESULTS: Recent PET CT scan is reviewed compatible with the above-stated findings  PLAN: At this time patient is stable I've assured her fatigue will improve over time and is related to residual effects of her chemotherapy and pancytopenia. Otherwise please were overall progress and excellent response by PET CT criteria. I've asked to see her back in 6 months for follow-up. She knows to call sooner with any concerns.  I would like to take this opportunity to thank you for allowing me to participate in the care of your patient.Armstead Peaks., MD

## 2016-08-15 ENCOUNTER — Telehealth: Payer: Self-pay | Admitting: *Deleted

## 2016-08-15 ENCOUNTER — Inpatient Hospital Stay: Payer: PPO | Attending: Oncology

## 2016-08-15 ENCOUNTER — Other Ambulatory Visit: Payer: Self-pay | Admitting: *Deleted

## 2016-08-15 DIAGNOSIS — Z923 Personal history of irradiation: Secondary | ICD-10-CM | POA: Insufficient documentation

## 2016-08-15 DIAGNOSIS — Z9221 Personal history of antineoplastic chemotherapy: Secondary | ICD-10-CM | POA: Insufficient documentation

## 2016-08-15 DIAGNOSIS — Z452 Encounter for adjustment and management of vascular access device: Secondary | ICD-10-CM | POA: Diagnosis not present

## 2016-08-15 DIAGNOSIS — C3412 Malignant neoplasm of upper lobe, left bronchus or lung: Secondary | ICD-10-CM | POA: Diagnosis not present

## 2016-08-15 MED ORDER — HEPARIN SOD (PORK) LOCK FLUSH 100 UNIT/ML IV SOLN
500.0000 [IU] | Freq: Once | INTRAVENOUS | Status: AC
  Administered 2016-08-15: 500 [IU] via INTRAVENOUS

## 2016-08-15 MED ORDER — OXYCODONE HCL 10 MG PO TABS: 10.0000 mg | ORAL_TABLET | Freq: Four times a day (QID) | ORAL | 0 refills | Status: DC | PRN

## 2016-08-15 MED ORDER — SODIUM CHLORIDE 0.9% FLUSH
10.0000 mL | INTRAVENOUS | Status: DC | PRN
  Administered 2016-08-15: 10 mL via INTRAVENOUS
  Filled 2016-08-15: qty 10

## 2016-08-15 NOTE — Telephone Encounter (Signed)
Patient called to find out what time her appointment was today,  request pain medication refill and to report chest pain. Advised patient to go to emergency room for chest pain if the the pain was still present, notified her that her appointment was at 2:30 PM today and that she could get her medication prescription during her appointment.

## 2016-08-23 ENCOUNTER — Emergency Department: Payer: PPO

## 2016-08-23 ENCOUNTER — Emergency Department
Admission: EM | Admit: 2016-08-23 | Discharge: 2016-08-23 | Disposition: A | Discharge status: Home / Self Care | Payer: PPO | Attending: Emergency Medicine | Admitting: Emergency Medicine

## 2016-08-23 ENCOUNTER — Other Ambulatory Visit: Payer: Self-pay

## 2016-08-23 ENCOUNTER — Encounter: Payer: Self-pay | Admitting: Radiology

## 2016-08-23 DIAGNOSIS — Z79899 Other long term (current) drug therapy: Secondary | ICD-10-CM | POA: Diagnosis not present

## 2016-08-23 DIAGNOSIS — F17210 Nicotine dependence, cigarettes, uncomplicated: Secondary | ICD-10-CM | POA: Insufficient documentation

## 2016-08-23 DIAGNOSIS — R0789 Other chest pain: Secondary | ICD-10-CM | POA: Insufficient documentation

## 2016-08-23 DIAGNOSIS — I10 Essential (primary) hypertension: Secondary | ICD-10-CM | POA: Diagnosis not present

## 2016-08-23 DIAGNOSIS — E119 Type 2 diabetes mellitus without complications: Secondary | ICD-10-CM | POA: Insufficient documentation

## 2016-08-23 DIAGNOSIS — R918 Other nonspecific abnormal finding of lung field: Secondary | ICD-10-CM

## 2016-08-23 DIAGNOSIS — Z85118 Personal history of other malignant neoplasm of bronchus and lung: Secondary | ICD-10-CM | POA: Insufficient documentation

## 2016-08-23 DIAGNOSIS — R0602 Shortness of breath: Secondary | ICD-10-CM | POA: Diagnosis not present

## 2016-08-23 DIAGNOSIS — R911 Solitary pulmonary nodule: Secondary | ICD-10-CM | POA: Diagnosis not present

## 2016-08-23 DIAGNOSIS — Z7984 Long term (current) use of oral hypoglycemic drugs: Secondary | ICD-10-CM | POA: Insufficient documentation

## 2016-08-23 DIAGNOSIS — Z794 Long term (current) use of insulin: Secondary | ICD-10-CM | POA: Diagnosis not present

## 2016-08-23 DIAGNOSIS — R079 Chest pain, unspecified: Secondary | ICD-10-CM | POA: Diagnosis not present

## 2016-08-23 LAB — TROPONIN I

## 2016-08-23 LAB — CBC
HCT: 32.3 % — ABNORMAL LOW (ref 35.0–47.0)
Hemoglobin: 11.1 g/dL — ABNORMAL LOW (ref 12.0–16.0)
MCH: 30.7 pg (ref 26.0–34.0)
MCHC: 34.3 g/dL (ref 32.0–36.0)
MCV: 89.7 fL (ref 80.0–100.0)
PLATELETS: 137 10*3/uL — AB (ref 150–440)
RBC: 3.6 MIL/uL — AB (ref 3.80–5.20)
RDW: 14.4 % (ref 11.5–14.5)
WBC: 3.1 10*3/uL — AB (ref 3.6–11.0)

## 2016-08-23 LAB — BASIC METABOLIC PANEL
ANION GAP: 5 (ref 5–15)
BUN: 12 mg/dL (ref 6–20)
CALCIUM: 9.5 mg/dL (ref 8.9–10.3)
CO2: 26 mmol/L (ref 22–32)
CREATININE: 0.74 mg/dL (ref 0.44–1.00)
Chloride: 107 mmol/L (ref 101–111)
Glucose, Bld: 205 mg/dL — ABNORMAL HIGH (ref 65–99)
Potassium: 4.1 mmol/L (ref 3.5–5.1)
SODIUM: 138 mmol/L (ref 135–145)

## 2016-08-23 MED ORDER — GI COCKTAIL ~~LOC~~
30.0000 mL | ORAL | Status: AC
  Administered 2016-08-23: 30 mL via ORAL

## 2016-08-23 MED ORDER — KETOROLAC TROMETHAMINE 30 MG/ML IJ SOLN
15.0000 mg | INTRAMUSCULAR | Status: AC
  Administered 2016-08-23: 15 mg via INTRAVENOUS

## 2016-08-23 MED ORDER — IOPAMIDOL (ISOVUE-370) INJECTION 76%
75.0000 mL | Freq: Once | INTRAVENOUS | Status: AC | PRN
  Administered 2016-08-23: 75 mL via INTRAVENOUS
  Filled 2016-08-23: qty 75

## 2016-08-23 MED ORDER — GI COCKTAIL ~~LOC~~
ORAL | Status: AC
  Administered 2016-08-23: 30 mL via ORAL
  Filled 2016-08-23: qty 30

## 2016-08-23 MED ORDER — KETOROLAC TROMETHAMINE 30 MG/ML IJ SOLN
INTRAMUSCULAR | Status: AC
  Administered 2016-08-23: 15 mg via INTRAVENOUS
  Filled 2016-08-23: qty 1

## 2016-08-23 MED ORDER — OXYCODONE HCL 5 MG PO TABS
5.0000 mg | ORAL_TABLET | Freq: Four times a day (QID) | ORAL | 0 refills | Status: DC | PRN
Start: 2016-08-23 — End: 2016-09-09

## 2016-08-23 MED ORDER — SODIUM CHLORIDE 0.9 % IV BOLUS (SEPSIS)
1000.0000 mL | Freq: Once | INTRAVENOUS | Status: AC
  Administered 2016-08-23: 1000 mL via INTRAVENOUS

## 2016-08-23 NOTE — ED Triage Notes (Signed)
Pt arrives to triage from home with reports of chest pain that has been off and on for two weeks  She reports that the pain is worse at night and sometimes wakes her up  "I have been having it so I better get it checked cause it won't go away."

## 2016-08-23 NOTE — ED Provider Notes (Signed)
[facility] Emergency Department Provider Note  ____________________________________________  Time seen: Approximately 3:17 PM  I have reviewed the triage vital signs and the nursing notes.   HISTORY  Chief Complaint Chest Pain    HPI Susan Willis is a 65 y.o. female who complains of left-sided sharp chest pain for the past 3 weeks which is been constant, waxing and waning. No alleviating factors. Pain is in the left lateral chest radiating around to the anterior aspect and stops short of the sternum. Worse when she lies down supine but otherwise no aggravating factors.. Not pleuritic, not exertional. The pain is similar to pain she had when she was first diagnosed with lung cancer in the past. She has completed chemotherapy in May of this year. Denies fever chills or cough. No dizziness syncope or shortness of breath. Denies history of DVT or PE.  Past Medical History:  Diagnosis Date  . Arthritis   . Blood dyscrasia   . Diabetes mellitus without complication ([place])   . Diabetic neuropathy ([place])   . High cholesterol   . Hypertension   . Lung cancer [facility])      Patient Active Problem List   Diagnosis Date Noted  . Malignant neoplasm of upper lobe of left lung ([place]) 12/07/2015     Past Surgical History:  Procedure Laterality Date  . ABDOMINAL HYSTERECTOMY    . CESAREAN SECTION    . ELECTROMAGNETIC NAVIGATION BROCHOSCOPY N/A 11/26/2015   Procedure: ELECTROMAGNETIC NAVIGATION BRONCHOSCOPY;  Surgeon: Flora Lipps, MD;  Location: [facility] ORS;  Service: Cardiopulmonary;  Laterality: N/A;  . ENDOBRONCHIAL ULTRASOUND N/A 11/26/2015   Procedure: ENDOBRONCHIAL ULTRASOUND;  Surgeon: Flora Lipps, MD;  Location: [facility] ORS;  Service: Cardiopulmonary;  Laterality: N/A;  . PERIPHERAL VASCULAR CATHETERIZATION N/A 12/09/2015   Procedure: Glori Luis Cath Insertion;  Surgeon: Katha Cabal, MD;  Location: [place] [facility];  Service: Cardiovascular;  Laterality: N/A;      Prior to Admission medications   Medication Sig Start Date End Date Taking? Authorizing Provider  atorvastatin (LIPITOR) 20 MG tablet Take 20 mg by mouth daily at 6 PM.    Historical Provider, MD  gabapentin (NEURONTIN) 300 MG capsule Take 1 capsule (300 mg total) by mouth at bedtime. 05/17/16   [name], MD  insulin glargine (LANTUS) 100 UNIT/ML injection Inject 60 Units into the skin at bedtime.    Historical Provider, MD  lidocaine-prilocaine (EMLA) cream Apply to affected area once 02/17/16   Cammie Sickle, MD  linagliptin (TRADJENTA) 5 MG TABS tablet Take 5 mg by mouth daily.     Historical Provider, MD  lisinopril (PRINIVIL,ZESTRIL) 10 MG tablet Take 10 mg by mouth daily.    Historical Provider, MD  metFORMIN (GLUMETZA) 500 MG (MOD) 24 hr tablet Take 1,000 mg by mouth 2 (two) times daily with a meal. Reported on 04/27/2016    Historical Provider, MD  ondansetron (ZOFRAN) 8 MG tablet Take 1 tablet (8 mg total) by mouth 2 (two) times daily. Start the day after chemo for 3 days. Then take as needed for nausea or vomiting. 02/17/16   Cammie Sickle, MD  oxyCODONE (ROXICODONE) 5 MG immediate release tablet Take 1 tablet (5 mg total) by mouth every 6 (six) hours as needed for breakthrough pain. 08/23/16   Carrie Mew, MD  Oxycodone HCl 10 MG TABS Take 1 tablet (10 mg total) by mouth every 6 (six) hours as needed. 08/15/16   [name], MD  prochlorperazine (COMPAZINE) 10  MG tablet Take 1 tablet (10 mg total) by mouth every 6 (six) hours as needed for nausea, vomiting or refractory nausea / vomiting (Nausea or vomiting). 02/17/16   Cammie Sickle, MD  promethazine (PHENERGAN) 25 MG tablet Take 1 tablet (25 mg total) by mouth every 6 (six) hours as needed for nausea or vomiting. 02/24/16   [name], MD     Allergies Review of patient's allergies indicates no known allergies.   No family history on file.  Social History Social History   Substance Use Topics  . Smoking status: Current Every Day Smoker    Packs/day: 0.30    Years: 35.00    Types: Cigarettes  . Smokeless tobacco: Never Used     Comment: workiing on quitting, down to 2 packs per week now  . Alcohol use No    Review of Systems  Constitutional:   No fever or chills.  ENT:   No sore throat. No rhinorrhea. Cardiovascular:   Positive as above chest pain. Respiratory:   No dyspnea or cough. Gastrointestinal:   Negative for abdominal pain, vomiting and diarrhea.   Musculoskeletal:   Negative for focal pain or swelling  10-point ROS otherwise negative.  ____________________________________________   PHYSICAL EXAM:  VITAL SIGNS: ED Triage Vitals  Enc Vitals Group     BP 08/23/16 1350 (!) 113/48     Pulse Rate 08/23/16 1350 87     Resp 08/23/16 1350 17     Temp 08/23/16 1350 98.3 F (36.8 C)     Temp Source 08/23/16 1350 Oral     SpO2 08/23/16 1350 99 %     Weight 08/23/16 1351 222 lb (100.7 kg)     Height 08/23/16 1351 '5\' 5"'$  (1.651 m)     Head Circumference --      Peak Flow --      Pain Score 08/23/16 1351 8     Pain Loc --      Pain Edu? --      Excl. in [place]? --     Vital signs reviewed, nursing assessments reviewed.   Constitutional:   Alert and oriented. Well appearing and in no distress. Eyes:   No scleral icterus. No conjunctival pallor. PERRL. EOMI.  No nystagmus. ENT   Head:   Normocephalic and atraumatic.   Nose:   No congestion/rhinnorhea. No septal hematoma   Mouth/Throat:   MMM, no pharyngeal erythema. No peritonsillar mass.    Neck:   No stridor. No SubQ emphysema. No meningismus. Hematological/Lymphatic/Immunilogical:   No cervical lymphadenopathy. Cardiovascular:   RRR. Symmetric bilateral radial and DP pulses.  No murmurs.  Respiratory:   Normal respiratory effort without tachypnea nor retractions. Breath sounds are clear and equal bilaterally. No wheezes/rales/rhonchi.Anterior chest wall tender to the touch  in the left upper chest which reproduces her pain. However, left lateral chest is nontender. Gastrointestinal:   Soft and nontender. Non distended. There is no CVA tenderness.  No rebound, rigidity, or guarding. Genitourinary:   deferred Musculoskeletal:   Nontender with normal range of motion in all extremities. No joint effusions.  No lower extremity tenderness.  No edema. Neurologic:   Normal speech and language.  CN 2-10 normal. Motor grossly intact. No gross focal neurologic deficits are appreciated.  Skin:    Skin is warm, dry and intact. No rash noted.  No petechiae, purpura, or bullae.  ____________________________________________    LABS (pertinent positives/negatives) (all labs ordered are listed, but only abnormal results are displayed) Labs  Reviewed  BASIC METABOLIC PANEL  CBC  TROPONIN I   ____________________________________________   EKG  Interpreted by me  Date: 08/23/2016  Rate: 85  Rhythm: normal sinus rhythm  QRS Axis: normal  Intervals: normal  ST/T Wave abnormalities: normal  Conduction Disutrbances: none  Narrative Interpretation: unremarkable      ____________________________________________    RADIOLOGY  CT angiogram chest negative for PE or dissection. Shows multiple pulmonary nodules suggestive of metastatic disease. Chest x-ray unremarkable  ____________________________________________   PROCEDURES Procedures  ____________________________________________   INITIAL IMPRESSION / ASSESSMENT AND PLAN / ED COURSE  Pertinent labs & imaging results that were available during my care of the patient were reviewed by me and considered in my medical decision making (see chart for details).  Patient presents with sharp chest pain, atypical in nature. Unclear etiology. It is somewhat reproducible on exam, and the radiation pattern suggests perhaps a intercostal neuropathy. However, with her lung cancer history and constant sharp chest pain,  we will get a CT scan to ensure this is not a pulmonary embolism. Otherwise,Considering the patient's symptoms, medical history, and physical examination today, I have low suspicion for ACS, TAD, pneumothorax, carditis, mediastinitis, pneumonia, CHF, or sepsis.       Clinical Course  Comment By Time  CBC reveals wbc 3.1, hgb 11.1, plt 137. Bmp sig. For glu 205. Creat 0.74. Carrie Mew, MD 09/19 1443    ----------------------------------------- 4:56 PM on 08/23/2016 -----------------------------------------  Patient in good spirits, calm and comfortable. Pain persists, overall better but still has occasional intermittent sharp pains. Vital signs stable. Imaging shows some enlarged pulmonary nodules but no emergent condition. Controlled substances reporting system reviewed. Patient does receive monthly prescriptions of oxycodone from her oncology clinic, I will give her an additional prescription for a few days to ensure that she has an adequate supply. Encouraged to follow up closely with her primary care doctor and oncology. Return precautions reviewed. ____________________________________________   FINAL CLINICAL IMPRESSION(S) / ED DIAGNOSES  Final diagnoses:  Chest wall pain  Pulmonary nodules       Portions of this note were generated with dragon dictation software. Dictation errors may occur despite best attempts at proofreading.    Carrie Mew, MD 08/23/16 [phone]

## 2016-08-23 NOTE — ED Notes (Signed)
EDP at bedside  

## 2016-09-09 ENCOUNTER — Other Ambulatory Visit: Payer: PPO

## 2016-09-09 ENCOUNTER — Telehealth: Payer: Self-pay | Admitting: *Deleted

## 2016-09-09 MED ORDER — OXYCODONE HCL 10 MG PO TABS
10.0000 mg | ORAL_TABLET | Freq: Four times a day (QID) | ORAL | 0 refills | Status: DC | PRN
Start: 2016-09-09 — End: 2016-10-03

## 2016-09-09 MED ORDER — MORPHINE SULFATE ER 30 MG PO TBCR: 30.0000 mg | EXTENDED_RELEASE_TABLET | Freq: Two times a day (BID) | ORAL | 0 refills | Status: DC

## 2016-09-09 NOTE — Telephone Encounter (Signed)
Left message on VM that she has 2 prescriptions here to pick up and that Dr Grayland Ormond added a long acting pain med for her take

## 2016-09-09 NOTE — Telephone Encounter (Signed)
Called to report that she is completely out of Oxycodone, She was given #90 tabs of 10 mg on 9/11 and #12 tabs of 5 mg on 9/19 by ER MD. She is not on long acting med. Do you want to refill the Oxy 10 mg or add a long acting med and refill the Oxy 10 mg. Please advise. I can have Dr Mike Gip sign what ever you decide

## 2016-09-09 NOTE — Telephone Encounter (Signed)
Go ahead and refill and add ms contin 30 q12hrs.  Thanks!

## 2016-09-15 ENCOUNTER — Ambulatory Visit
Admission: RE | Admit: 2016-09-15 | Discharge: 2016-09-15 | Disposition: A | Discharge status: Home / Self Care | Payer: PPO | Source: Ambulatory Visit | Attending: Oncology | Admitting: Oncology

## 2016-09-15 ENCOUNTER — Inpatient Hospital Stay: Payer: PPO

## 2016-09-15 ENCOUNTER — Inpatient Hospital Stay: Payer: PPO | Attending: Oncology

## 2016-09-15 DIAGNOSIS — D649 Anemia, unspecified: Secondary | ICD-10-CM | POA: Insufficient documentation

## 2016-09-15 DIAGNOSIS — Z923 Personal history of irradiation: Secondary | ICD-10-CM | POA: Insufficient documentation

## 2016-09-15 DIAGNOSIS — E1165 Type 2 diabetes mellitus with hyperglycemia: Secondary | ICD-10-CM | POA: Diagnosis not present

## 2016-09-15 DIAGNOSIS — C3412 Malignant neoplasm of upper lobe, left bronchus or lung: Secondary | ICD-10-CM | POA: Diagnosis not present

## 2016-09-15 DIAGNOSIS — D696 Thrombocytopenia, unspecified: Secondary | ICD-10-CM | POA: Insufficient documentation

## 2016-09-15 DIAGNOSIS — R918 Other nonspecific abnormal finding of lung field: Secondary | ICD-10-CM | POA: Insufficient documentation

## 2016-09-15 DIAGNOSIS — I10 Essential (primary) hypertension: Secondary | ICD-10-CM | POA: Diagnosis not present

## 2016-09-15 DIAGNOSIS — E7800 Pure hypercholesterolemia, unspecified: Secondary | ICD-10-CM | POA: Insufficient documentation

## 2016-09-15 DIAGNOSIS — Z9221 Personal history of antineoplastic chemotherapy: Secondary | ICD-10-CM | POA: Diagnosis not present

## 2016-09-15 DIAGNOSIS — Z79899 Other long term (current) drug therapy: Secondary | ICD-10-CM | POA: Diagnosis not present

## 2016-09-15 DIAGNOSIS — C3490 Malignant neoplasm of unspecified part of unspecified bronchus or lung: Secondary | ICD-10-CM | POA: Diagnosis not present

## 2016-09-15 DIAGNOSIS — M1990 Unspecified osteoarthritis, unspecified site: Secondary | ICD-10-CM | POA: Insufficient documentation

## 2016-09-15 DIAGNOSIS — Z95828 Presence of other vascular implants and grafts: Secondary | ICD-10-CM

## 2016-09-15 HISTORY — DX: Malignant neoplasm of unspecified part of left bronchus or lung: C34.92

## 2016-09-15 LAB — CBC WITH DIFFERENTIAL/PLATELET
Basophils Absolute: 0 10*3/uL (ref 0–0.1)
Basophils Relative: 0 %
Eosinophils Absolute: 0 10*3/uL (ref 0–0.7)
Eosinophils Relative: 1 %
HCT: 29.4 % — ABNORMAL LOW (ref 35.0–47.0)
HEMOGLOBIN: 10 g/dL — AB (ref 12.0–16.0)
LYMPHS ABS: 0.9 10*3/uL — AB (ref 1.0–3.6)
LYMPHS PCT: 25 %
MCH: 30.1 pg (ref 26.0–34.0)
MCHC: 34.2 g/dL (ref 32.0–36.0)
MCV: 87.9 fL (ref 80.0–100.0)
Monocytes Absolute: 0.3 10*3/uL (ref 0.2–0.9)
Monocytes Relative: 10 %
NEUTROS PCT: 64 %
Neutro Abs: 2.2 10*3/uL (ref 1.4–6.5)
Platelets: 125 10*3/uL — ABNORMAL LOW (ref 150–440)
RBC: 3.34 MIL/uL — AB (ref 3.80–5.20)
RDW: 14.5 % (ref 11.5–14.5)
WBC: 3.4 10*3/uL — AB (ref 3.6–11.0)

## 2016-09-15 LAB — COMPREHENSIVE METABOLIC PANEL
ALK PHOS: 73 U/L (ref 38–126)
ALT: 14 U/L (ref 14–54)
AST: 15 U/L (ref 15–41)
Albumin: 3.5 g/dL (ref 3.5–5.0)
Anion gap: 8 (ref 5–15)
BUN: 12 mg/dL (ref 6–20)
CALCIUM: 9.1 mg/dL (ref 8.9–10.3)
CO2: 25 mmol/L (ref 22–32)
CREATININE: 0.68 mg/dL (ref 0.44–1.00)
Chloride: 104 mmol/L (ref 101–111)
Glucose, Bld: 181 mg/dL — ABNORMAL HIGH (ref 65–99)
Potassium: 4.3 mmol/L (ref 3.5–5.1)
SODIUM: 137 mmol/L (ref 135–145)
Total Bilirubin: 0.6 mg/dL (ref 0.3–1.2)
Total Protein: 6.6 g/dL (ref 6.5–8.1)

## 2016-09-15 MED ORDER — HEPARIN SOD (PORK) LOCK FLUSH 100 UNIT/ML IV SOLN
500.0000 [IU] | Freq: Once | INTRAVENOUS | Status: AC
  Administered 2016-09-15: 500 [IU] via INTRAVENOUS

## 2016-09-15 MED ORDER — IOPAMIDOL (ISOVUE-300) INJECTION 61%
75.0000 mL | Freq: Once | INTRAVENOUS | Status: AC | PRN
  Administered 2016-09-15: 75 mL via INTRAVENOUS

## 2016-09-15 MED ORDER — SODIUM CHLORIDE 0.9% FLUSH
10.0000 mL | INTRAVENOUS | Status: DC | PRN
  Administered 2016-09-15: 10 mL via INTRAVENOUS
  Filled 2016-09-15: qty 10

## 2016-09-21 NOTE — Progress Notes (Signed)
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  INTERVAL HISTORY: Patient returns to clinic today for further evaluation and discussion of her imaging results. She continues to have peripheral neuropathy. She also has left arm pain that is improved with oxycodone. She otherwise feels well. She has no other neurologic complaints. She denies any recent fevers or illnesses. She denies any chest pain, cough, hemoptysis, or shortness of breath. She denies constipation, diarrhea, nausea, or vomiting.  She has no urinary complaints. Patient offers no further specific complaints today.  REVIEW OF SYSTEMS:   Review of Systems  Constitutional: Negative.  Negative for fever, malaise/fatigue and weight loss.  Respiratory: Negative.  Negative for cough, hemoptysis and shortness of breath.   Cardiovascular: Negative.  Negative for chest pain and leg swelling.  Gastrointestinal: Negative.  Negative for abdominal pain.  Genitourinary: Negative.   Musculoskeletal: Positive for joint pain.  Neurological: Positive for sensory change. Negative for weakness.  Psychiatric/Behavioral: Negative.  The patient is not nervous/anxious.     As per HPI. Otherwise, a complete review of systems is negative.  PAST MEDICAL HISTORY: Past Medical History:  Diagnosis Date  . Arthritis   . Blood dyscrasia   . Cancer of left lung ([place]) 08/2015   Rad + chemo tx's.  . Diabetes mellitus without complication ([place])   . Diabetic neuropathy ([place])   . High cholesterol   . Hypertension     PAST SURGICAL HISTORY: Past Surgical History:  Procedure Laterality Date  . ABDOMINAL HYSTERECTOMY    . CESAREAN SECTION    . ELECTROMAGNETIC NAVIGATION BROCHOSCOPY N/A 11/26/2015   Procedure: ELECTROMAGNETIC NAVIGATION BRONCHOSCOPY;  Surgeon: Flora Lipps, MD;  Location: [facility];  Service: Cardiopulmonary;  Laterality: N/A;  . ENDOBRONCHIAL ULTRASOUND N/A 11/26/2015   Procedure: ENDOBRONCHIAL ULTRASOUND;  Surgeon: Flora Lipps, MD;  Location: [facility];  Service: Cardiopulmonary;  Laterality: N/A;  . PERIPHERAL VASCULAR CATHETERIZATION N/A 12/09/2015   Procedure: Glori Luis Cath Insertion;  Surgeon: Katha Cabal, MD;  Location: [facility];  Service: Cardiovascular;  Laterality: N/A;    FAMILY HISTORY: Reviewed and unchanged. No reported history of malignancy or chronic disease.     ADVANCED DIRECTIVES:    HEALTH MAINTENANCE: Social History  Substance Use Topics  . Smoking status: Current Every Day Smoker    Packs/day: 0.30    Years: 35.00    Types: Cigarettes  . Smokeless tobacco: Never Used     Comment: workiing on quitting, down to 2 packs per week now  . Alcohol use No     No Known Allergies  Current Outpatient Prescriptions  Medication Sig Dispense Refill  . atorvastatin (LIPITOR) 20 MG tablet Take 20 mg by mouth daily at 6 PM.    . gabapentin (NEURONTIN) 300 MG capsule Take 1 capsule (300 mg total) by mouth at bedtime. 30 capsule 0  . insulin glargine (LANTUS) 100 UNIT/ML injection Inject 60 Units into the skin at bedtime.    . lidocaine-prilocaine (EMLA) cream Apply to affected area once 30 g 6  . linagliptin (TRADJENTA) 5 MG TABS tablet Take 5 mg by mouth daily.     Marland Kitchen lisinopril (PRINIVIL,ZESTRIL) 10 MG tablet Take 10 mg by mouth daily.    . metFORMIN (GLUMETZA) 500 MG (MOD) 24  hr tablet Take 1,000 mg by mouth 2 (two) times daily with a meal. Reported on 04/27/2016    . morphine (MS CONTIN) 30 MG 12 hr tablet Take 1 tablet (30 mg total) by mouth every 12 (twelve) hours. 60 tablet 0  . ondansetron (ZOFRAN) 8 MG tablet Take 1 tablet (8 mg total) by mouth 2 (two) times daily. Start the day after chemo for 3 days. Then take as needed for nausea or  vomiting. 30 tablet 3  . Oxycodone HCl 10 MG TABS Take 1 tablet (10 mg total) by mouth every 6 (six) hours as needed. 90 tablet 0  . prochlorperazine (COMPAZINE) 10 MG tablet Take 1 tablet (10 mg total) by mouth every 6 (six) hours as needed for nausea, vomiting or refractory nausea / vomiting (Nausea or vomiting). 30 tablet 3  . promethazine (PHENERGAN) 25 MG tablet Take 1 tablet (25 mg total) by mouth every 6 (six) hours as needed for nausea or vomiting. 30 tablet 0   No current facility-administered medications for this visit.     OBJECTIVE: Vitals:   09/22/16 1445  BP: 121/72  Pulse: 87  Resp: 18  Temp: 98 F (36.7 C)     Body mass index is 36.87 kg/m.    ECOG FS:1 - Symptomatic but completely ambulatory  General: Well-developed, well-nourished, no acute distress. Eyes: Pink conjunctiva, anicteric sclera. Lungs: Clear to auscultation bilaterally. Heart: Regular rate and rhythm. No rubs, murmurs, or gallops. Abdomen: Soft, nontender, nondistended. No organomegaly noted, normoactive bowel sounds. Musculoskeletal: No edema, cyanosis, or clubbing. Neuro: Alert, answering all questions appropriately. Cranial nerves grossly intact. Skin: No rashes or petechiae noted. Psych: Normal affect.   LAB RESULTS:  Lab Results  Component Value Date   NA 137 09/15/2016   K 4.3 09/15/2016   CL 104 09/15/2016   CO2 25 09/15/2016   GLUCOSE 181 (H) 09/15/2016   BUN 12 09/15/2016   CREATININE 0.68 09/15/2016   CALCIUM 9.1 09/15/2016   PROT 6.6 09/15/2016   ALBUMIN 3.5 09/15/2016   AST 15 09/15/2016   ALT 14 09/15/2016   ALKPHOS 73 09/15/2016   BILITOT 0.6 09/15/2016   GFRNONAA >60 09/15/2016   GFRAA >60 09/15/2016    Lab Results  Component Value Date   WBC 3.4 (L) 09/15/2016   NEUTROABS 2.2 09/15/2016   HGB 10.0 (L) 09/15/2016   HCT 29.4 (L) 09/15/2016   MCV 87.9 09/15/2016   PLT 125 (L) 09/15/2016   Lab Results  Component Value Date   IRON 63 03/30/2016   TIBC 290  03/30/2016   IRONPCTSAT 22 03/30/2016    Lab Results  Component Value Date   FERRITIN 126 03/30/2016     STUDIES: Ct Chest W Contrast  Result Date: 09/15/2016 CLINICAL DATA:  Patient with history of lung cancer. Restaging evaluation. EXAM: CT CHEST WITH CONTRAST TECHNIQUE: Multidetector CT imaging of the chest was performed during intravenous contrast administration. CONTRAST:  13m ISOVUE-300 IOPAMIDOL (ISOVUE-300) INJECTION 61% COMPARISON:  CT chest 08/23/2016 ; PET-CT 06/22/2016. FINDINGS: Cardiovascular: Right anterior chest wall Port-A-Cath is present with tip terminating at the superior cavoatrial junction. No pericardial effusion. Coronary arterial vascular calcifications. Mediastinum/Nodes: Grossly unchanged soft tissue within the AP window measuring 1.9 x 1.2 cm (image 49; series 2). Grossly unchanged 1.3 cm precarinal lymph node (image 56; series 2). Esophagus is unremarkable. Lungs/Pleura: Central airways are patent. Grossly unchanged 1.5 x 0.8 cm irregular nodule within the left upper lobe (image 39; series 3). Grossly unchanged 7 mm nodule  superior segment right lower lobe (image 62; series 3). Interval increase in size of 5 mm right middle lobe nodule (image 78; series 3), previously 3 mm. Grossly unchanged 6 mm right middle lobe nodule (image 75; series 3). Grossly unchanged irregular nodularity within the superior segment left lower lobe (image 70; series 3). Interval development of a more focal 1.4 cm nodule within the left upper lobe (image 61; series 3) and an adjacent 0.7 cm nodule (image 63; series 3). No pleural effusion or pneumothorax. Upper Abdomen: Normal adrenal glands. Musculoskeletal: No aggressive or acute appearing osseous lesions. Mid thoracic spine degenerative changes. IMPRESSION: Interval development of a few new pulmonary nodules, particularly within the left upper lobe. Additional pulmonary nodules are grossly stable when compared to prior examination. Findings are  concerning for the possibility of pulmonary metastatic disease. Alternatively, a few of these nodules could be related to underlying pulmonary parenchymal disease. Grossly unchanged irregular nodule within the left upper lobe at the site of initial primary malignancy. Grossly unchanged left hilar soft tissue. Re- demonstrated scattered centrilobular ground-glass nodularity suggestive of Langerhans cell histiocytosis. Electronically Signed   By: Lovey Newcomer M.D.   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 She then underwent 2 cycles of consolidation carboplatinum and Taxol completing on Apr 27, 2016. CT scan results from September 15, 2016 reviewed independently and reported as above. Patient has had interval development of new pulmonary nodules particularly in the outflow upper lobe. Concern for possible metastatic disease, but these are too small to characterize and are under the sensitivity of the PET scan. Continue to monitor closely and will repeat CT scan in 3 months with follow-up 1-2 days later.   2. Anemia: Improved with hemoglobin 10.0. Previously iron stores were within normal limits, monitor.  3. Thrombocytopenia: Mild, likely due to of chemotherapy. Monitor. 4. Hyperglycemia: Continue diabetic medications as prescribed. 5. Pain: Continue with PRN Oxycodone 10 mg as needed.    Patient expressed understanding and was in agreement with this plan. She also understands that She can call clinic at any time with any questions, concerns, or complaints.    [name], MD   09/30/2016 10:45 AM                        [place]  Telephone:(336) [phone] Fax:(336) [phone]  ID: Susan Willis OB: [birth year]  MR#: [id number]  OEV#:[id number]  Patient Care  Team: Marguerita Merles, MD as PCP - General (Family Medicine)  CHIEF COMPLAINT: Lung mass.  INTERVAL HISTORY: Patient returns to clinic today for further evaluation and consideration of cycle 2 of consolidation carboplatinum and Taxol. She continues to feel dizzy today. She has numbness in her right fingers and left foot that comes and goes and does not interfere with her daily activities and it is not painful. She is having difficulty sleeping at night. She still has pain in her knees, she takes pain medication as needed, usually 2-3 tabs daily. She has no other neurologic complaints. She denies any chest pain or shortness of breath. She denies constipation, diarrhea, nausea, or vomiting.  She denies any recent fevers or illnesses. She denies cough, or hemoptysis. She has no urinary complaints. Patient offers no further specific  complaints today.  REVIEW OF SYSTEMS:   Review of Systems  Constitutional: Negative for fever, chills, weight loss, malaise/fatigue and diaphoresis.  HENT: Negative.   Eyes: Negative.   Respiratory: Negative for cough, hemoptysis, sputum production, shortness of breath and wheezing.   Cardiovascular: Negative for chest pain, palpitations, orthopnea, claudication, leg swelling and PND.  Gastrointestinal: Negative for heartburn, nausea, vomiting, abdominal pain, diarrhea, constipation, blood in stool and melena.  Genitourinary: Negative.   Musculoskeletal: Positive for joint pain. Negative for myalgias.  Skin: Negative.   Neurological: Positive for dizziness and sensory change. Negative for tingling, focal weakness, seizures and weakness.  Endo/Heme/Allergies: Does not bruise/bleed easily.  Psychiatric/Behavioral: Negative for depression. The patient has insomnia. The patient is not nervous/anxious.     As per HPI. Otherwise, a complete review of systems is negatve.  PAST MEDICAL HISTORY: Past Medical History:  Diagnosis Date  . Arthritis   . Blood dyscrasia   .  Cancer of left lung ([place]) 08/2015   Rad + chemo tx's.  . Diabetes mellitus without complication ([place])   . Diabetic neuropathy ([place])   . High cholesterol   . Hypertension     PAST SURGICAL HISTORY: Past Surgical History:  Procedure Laterality Date  . ABDOMINAL HYSTERECTOMY    . CESAREAN SECTION    . ELECTROMAGNETIC NAVIGATION BROCHOSCOPY N/A 11/26/2015   Procedure: ELECTROMAGNETIC NAVIGATION BRONCHOSCOPY;  Surgeon: Flora Lipps, MD;  Location: [facility];  Service: Cardiopulmonary;  Laterality: N/A;  . ENDOBRONCHIAL ULTRASOUND N/A 11/26/2015   Procedure: ENDOBRONCHIAL ULTRASOUND;  Surgeon: Flora Lipps, MD;  Location: [facility];  Service: Cardiopulmonary;  Laterality: N/A;  . PERIPHERAL VASCULAR CATHETERIZATION N/A 12/09/2015   Procedure: Glori Luis Cath Insertion;  Surgeon: Katha Cabal, MD;  Location: [place] [facility];  Service: Cardiovascular;  Laterality: N/A;    FAMILY HISTORY: Reviewed and unchanged. No reported history of malignancy or chronic disease.     ADVANCED DIRECTIVES:    HEALTH MAINTENANCE: Social History  Substance Use Topics  . Smoking status: Current Every Day Smoker    Packs/day: 0.30    Years: 35.00    Types: Cigarettes  . Smokeless tobacco: Never Used     Comment: workiing on quitting, down to 2 packs per week now  . Alcohol use No     No Known Allergies  Current Outpatient Prescriptions  Medication Sig Dispense Refill  . atorvastatin (LIPITOR) 20 MG tablet Take 20 mg by mouth daily at 6 PM.    . gabapentin (NEURONTIN) 300 MG capsule Take 1 capsule (300 mg total) by mouth at bedtime. 30 capsule 0  . insulin glargine (LANTUS) 100 UNIT/ML injection Inject 60 Units into the skin at bedtime.    . lidocaine-prilocaine (EMLA) cream Apply to affected area once 30 g 6  . linagliptin (TRADJENTA) 5 MG TABS tablet Take 5 mg by mouth daily.     Marland Kitchen lisinopril (PRINIVIL,ZESTRIL) 10 MG tablet Take 10 mg by mouth daily.    . metFORMIN (GLUMETZA) 500 MG (MOD) 24 hr  tablet Take 1,000 mg by mouth 2 (two) times daily with a meal. Reported on 04/27/2016    . morphine (MS CONTIN) 30 MG 12 hr tablet Take 1 tablet (30 mg total) by mouth every 12 (twelve) hours. 60 tablet 0  . ondansetron (ZOFRAN) 8 MG tablet Take 1 tablet (8 mg total) by mouth 2 (two) times daily. Start the day after chemo for 3 days. Then take as needed for nausea or vomiting. 30 tablet 3  .  Oxycodone HCl 10 MG TABS Take 1 tablet (10 mg total) by mouth every 6 (six) hours as needed. 90 tablet 0  . prochlorperazine (COMPAZINE) 10 MG tablet Take 1 tablet (10 mg total) by mouth every 6 (six) hours as needed for nausea, vomiting or refractory nausea / vomiting (Nausea or vomiting). 30 tablet 3  . promethazine (PHENERGAN) 25 MG tablet Take 1 tablet (25 mg total) by mouth every 6 (six) hours as needed for nausea or vomiting. 30 tablet 0   No current facility-administered medications for this visit.     OBJECTIVE: Vitals:   09/22/16 1445  BP: 121/72  Pulse: 87  Resp: 18  Temp: 98 F (36.7 C)     Body mass index is 36.87 kg/m.    ECOG FS:1 - Symptomatic but completely ambulatory  General: Well-developed, well-nourished, no acute distress. Eyes: Pink conjunctiva, anicteric sclera. Lungs: Clear to auscultation bilaterally. Heart: Regular rate and rhythm. No rubs, murmurs, or gallops. Abdomen: Soft, nontender, nondistended. No organomegaly noted, normoactive bowel sounds. Musculoskeletal: No edema, cyanosis, or clubbing. Neuro: Alert, answering all questions appropriately. Cranial nerves grossly intact. Skin: No rashes or petechiae noted. Psych: Normal affect.   LAB RESULTS:  Lab Results  Component Value Date   NA 137 09/15/2016   K 4.3 09/15/2016   CL 104 09/15/2016   CO2 25 09/15/2016   GLUCOSE 181 (H) 09/15/2016   BUN 12 09/15/2016   CREATININE 0.68 09/15/2016   CALCIUM 9.1 09/15/2016   PROT 6.6 09/15/2016   ALBUMIN 3.5 09/15/2016   AST 15 09/15/2016   ALT 14 09/15/2016    ALKPHOS 73 09/15/2016   BILITOT 0.6 09/15/2016   GFRNONAA >60 09/15/2016   GFRAA >60 09/15/2016    Lab Results  Component Value Date   WBC 3.4 (L) 09/15/2016   NEUTROABS 2.2 09/15/2016   HGB 10.0 (L) 09/15/2016   HCT 29.4 (L) 09/15/2016   MCV 87.9 09/15/2016   PLT 125 (L) 09/15/2016   Lab Results  Component Value Date   IRON 63 03/30/2016   TIBC 290 03/30/2016   IRONPCTSAT 22 03/30/2016    Lab Results  Component Value Date   FERRITIN 126 03/30/2016     STUDIES: Ct Chest W Contrast  Result Date: 09/15/2016 CLINICAL DATA:  Patient with history of lung cancer. Restaging evaluation. EXAM: CT CHEST WITH CONTRAST TECHNIQUE: Multidetector CT imaging of the chest was performed during intravenous contrast administration. CONTRAST:  8m ISOVUE-300 IOPAMIDOL (ISOVUE-300) INJECTION 61% COMPARISON:  CT chest 08/23/2016 ; PET-CT 06/22/2016. FINDINGS: Cardiovascular: Right anterior chest wall Port-A-Cath is present with tip terminating at the superior cavoatrial junction. No pericardial effusion. Coronary arterial vascular calcifications. Mediastinum/Nodes: Grossly unchanged soft tissue within the AP window measuring 1.9 x 1.2 cm (image 49; series 2). Grossly unchanged 1.3 cm precarinal lymph node (image 56; series 2). Esophagus is unremarkable. Lungs/Pleura: Central airways are patent. Grossly unchanged 1.5 x 0.8 cm irregular nodule within the left upper lobe (image 39; series 3). Grossly unchanged 7 mm nodule superior segment right lower lobe (image 62; series 3). Interval increase in size of 5 mm right middle lobe nodule (image 78; series 3), previously 3 mm. Grossly unchanged 6 mm right middle lobe nodule (image 75; series 3). Grossly unchanged irregular nodularity within the superior segment left lower lobe (image 70; series 3). Interval development of a more focal 1.4 cm nodule within the left upper lobe (image 61; series 3) and an adjacent 0.7 cm nodule (image 63; series 3). No pleural  effusion or pneumothorax. Upper Abdomen: Normal adrenal glands. Musculoskeletal: No aggressive or acute appearing osseous lesions. Mid thoracic spine degenerative changes. IMPRESSION: Interval development of a few new pulmonary nodules, particularly within the left upper lobe. Additional pulmonary nodules are grossly stable when compared to prior examination. Findings are concerning for the possibility of pulmonary metastatic disease. Alternatively, a few of these nodules could be related to underlying pulmonary parenchymal disease. Grossly unchanged irregular nodule within the left upper lobe at the site of initial primary malignancy. Grossly unchanged left hilar soft tissue. Re- demonstrated scattered centrilobular ground-glass nodularity suggestive of Langerhans cell histiocytosis. Electronically Signed   By: Lovey Newcomer M.D.   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  PLAN:    1. 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 PET scan results reviewed independently and reported as above with marked response to therapy with minimal residual soft tissue abnormality. No further intervention is needed at this time. Return to clinic in 3 months with repeat imaging with CT scan and further evaluation.  2. Pancytopenia: Secondary to chemotherapy, monitor. Previously, patient's iron stores were within normal limits.  3. Hyperglycemia: Patient's blood glucose remained persistently elevated. Continue current diabetic medications as prescribed.  4. Peripheral neuropathy: Secondary to Taxol. Continue gabapentin as prescribed. 5. Insomnia: Continue over-the-counter sleep aid as needed. 6. Pain: Patient does not complain of this today. Continue oxycodone as needed.     Patient expressed understanding and was in agreement with this plan. She also understands that  She can call clinic at any time with any questions, concerns, or complaints.    [name], MD   09/30/2016 10:45 AM

## 2016-09-22 ENCOUNTER — Inpatient Hospital Stay (HOSPITAL_BASED_OUTPATIENT_CLINIC_OR_DEPARTMENT_OTHER): Payer: PPO | Admitting: Oncology

## 2016-09-22 VITALS — BP 121/72 | HR 87 | Temp 98.0°F | Resp 18 | Wt 221.6 lb

## 2016-09-22 DIAGNOSIS — C3412 Malignant neoplasm of upper lobe, left bronchus or lung: Secondary | ICD-10-CM

## 2016-09-22 DIAGNOSIS — D696 Thrombocytopenia, unspecified: Secondary | ICD-10-CM

## 2016-09-22 DIAGNOSIS — Z9221 Personal history of antineoplastic chemotherapy: Secondary | ICD-10-CM

## 2016-09-22 DIAGNOSIS — E1165 Type 2 diabetes mellitus with hyperglycemia: Secondary | ICD-10-CM

## 2016-09-22 DIAGNOSIS — Z923 Personal history of irradiation: Secondary | ICD-10-CM

## 2016-09-22 DIAGNOSIS — D649 Anemia, unspecified: Secondary | ICD-10-CM | POA: Diagnosis not present

## 2016-09-22 DIAGNOSIS — Z79899 Other long term (current) drug therapy: Secondary | ICD-10-CM

## 2016-09-22 NOTE — Progress Notes (Signed)
States continues to have pain under left arm that takes oxycodone to relieve pain. States is in the process of finding new PCP that is closer to her home.

## 2016-10-03 ENCOUNTER — Other Ambulatory Visit: Payer: Self-pay | Admitting: *Deleted

## 2016-10-03 MED ORDER — OXYCODONE HCL 10 MG PO TABS: 10.0000 mg | ORAL_TABLET | Freq: Four times a day (QID) | ORAL | 0 refills | Status: DC | PRN

## 2016-10-31 ENCOUNTER — Other Ambulatory Visit: Payer: Self-pay | Admitting: *Deleted

## 2016-10-31 MED ORDER — OXYCODONE HCL 10 MG PO TABS: 10.0000 mg | ORAL_TABLET | Freq: Four times a day (QID) | ORAL | 0 refills | Status: DC | PRN

## 2016-11-03 ENCOUNTER — Inpatient Hospital Stay: Payer: PPO

## 2016-11-10 ENCOUNTER — Inpatient Hospital Stay: Payer: PPO

## 2016-11-10 ENCOUNTER — Inpatient Hospital Stay: Payer: PPO | Attending: Oncology

## 2016-11-10 DIAGNOSIS — D649 Anemia, unspecified: Secondary | ICD-10-CM | POA: Diagnosis not present

## 2016-11-10 DIAGNOSIS — D696 Thrombocytopenia, unspecified: Secondary | ICD-10-CM | POA: Insufficient documentation

## 2016-11-10 DIAGNOSIS — C3412 Malignant neoplasm of upper lobe, left bronchus or lung: Secondary | ICD-10-CM | POA: Diagnosis not present

## 2016-11-10 DIAGNOSIS — Z923 Personal history of irradiation: Secondary | ICD-10-CM | POA: Diagnosis not present

## 2016-11-10 DIAGNOSIS — Z9221 Personal history of antineoplastic chemotherapy: Secondary | ICD-10-CM | POA: Diagnosis not present

## 2016-11-10 DIAGNOSIS — Z452 Encounter for adjustment and management of vascular access device: Secondary | ICD-10-CM | POA: Diagnosis not present

## 2016-11-10 DIAGNOSIS — C801 Malignant (primary) neoplasm, unspecified: Secondary | ICD-10-CM

## 2016-11-10 MED ORDER — HEPARIN SOD (PORK) LOCK FLUSH 100 UNIT/ML IV SOLN
500.0000 [IU] | Freq: Once | INTRAVENOUS | Status: AC
  Administered 2016-11-10: 500 [IU] via INTRAVENOUS

## 2016-11-10 MED ORDER — SODIUM CHLORIDE 0.9 % IJ SOLN
10.0000 mL | Freq: Once | INTRAMUSCULAR | Status: AC
  Administered 2016-11-10: 10 mL via INTRAVENOUS
  Filled 2016-11-10: qty 10

## 2016-11-10 MED ORDER — HEPARIN SOD (PORK) LOCK FLUSH 100 UNIT/ML IV SOLN
INTRAVENOUS | Status: AC
  Filled 2016-11-10: qty 5

## 2016-11-10 NOTE — Progress Notes (Signed)
Survivorship Care Plan visit completed.  Treatment summary reviewed and given to patient.  ASCO answers booklet reviewed and given to patient.  CARE program and Cancer Transitions discussed with patient along with other resources cancer center offers to patients and caregivers.  Patient verbalized understanding.    

## 2016-11-21 DIAGNOSIS — C3412 Malignant neoplasm of upper lobe, left bronchus or lung: Secondary | ICD-10-CM | POA: Diagnosis not present

## 2016-11-21 DIAGNOSIS — Z23 Encounter for immunization: Secondary | ICD-10-CM | POA: Diagnosis not present

## 2016-11-21 DIAGNOSIS — E119 Type 2 diabetes mellitus without complications: Secondary | ICD-10-CM | POA: Diagnosis not present

## 2016-11-21 DIAGNOSIS — F17200 Nicotine dependence, unspecified, uncomplicated: Secondary | ICD-10-CM | POA: Diagnosis not present

## 2016-11-21 DIAGNOSIS — R3911 Hesitancy of micturition: Secondary | ICD-10-CM | POA: Diagnosis not present

## 2016-11-21 DIAGNOSIS — Z1231 Encounter for screening mammogram for malignant neoplasm of breast: Secondary | ICD-10-CM | POA: Diagnosis not present

## 2016-11-21 DIAGNOSIS — I10 Essential (primary) hypertension: Secondary | ICD-10-CM | POA: Diagnosis not present

## 2016-11-21 DIAGNOSIS — R7309 Other abnormal glucose: Secondary | ICD-10-CM | POA: Diagnosis not present

## 2016-11-29 ENCOUNTER — Other Ambulatory Visit: Payer: Self-pay | Admitting: *Deleted

## 2016-11-29 MED ORDER — OXYCODONE HCL 10 MG PO TABS: 10.0000 mg | ORAL_TABLET | Freq: Four times a day (QID) | ORAL | 0 refills | Status: DC | PRN

## 2016-12-01 DIAGNOSIS — F4320 Adjustment disorder, unspecified: Secondary | ICD-10-CM | POA: Diagnosis not present

## 2016-12-01 DIAGNOSIS — F17200 Nicotine dependence, unspecified, uncomplicated: Secondary | ICD-10-CM | POA: Diagnosis not present

## 2016-12-06 DIAGNOSIS — Z1231 Encounter for screening mammogram for malignant neoplasm of breast: Secondary | ICD-10-CM | POA: Diagnosis not present

## 2016-12-19 NOTE — Progress Notes (Deleted)
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  INTERVAL HISTORY: Patient returns to clinic today for further evaluation and discussion of her imaging results. She continues to have peripheral neuropathy. She also has left arm pain that is improved with oxycodone. She otherwise feels well. She has no other neurologic complaints. She denies any recent fevers or illnesses. She denies any chest pain, cough, hemoptysis, or shortness of breath. She denies constipation, diarrhea, nausea, or vomiting.  She has no urinary complaints. Patient offers no further specific complaints today.  REVIEW OF SYSTEMS:   Review of Systems  Constitutional: Negative.  Negative for fever, malaise/fatigue and weight loss.  Respiratory: Negative.  Negative for cough, hemoptysis and shortness of breath.   Cardiovascular: Negative.  Negative for chest pain and leg swelling.  Gastrointestinal: Negative.  Negative for abdominal pain.  Genitourinary: Negative.   Musculoskeletal: Positive for joint pain.  Neurological: Positive for sensory change. Negative for weakness.  Psychiatric/Behavioral: Negative.  The patient is not nervous/anxious.     As per HPI. Otherwise, a complete review of systems is negative.  PAST MEDICAL HISTORY: Past Medical History:  Diagnosis Date  . Arthritis   . Blood dyscrasia   . Cancer of left lung ([place]) 08/2015   Rad + chemo tx's.  . Diabetes mellitus without complication ([place])   . Diabetic neuropathy ([place])   . High cholesterol   . Hypertension     PAST SURGICAL  HISTORY: Past Surgical History:  Procedure Laterality Date  . ABDOMINAL HYSTERECTOMY    . CESAREAN SECTION    . ELECTROMAGNETIC NAVIGATION BROCHOSCOPY N/A 11/26/2015   Procedure: ELECTROMAGNETIC NAVIGATION BRONCHOSCOPY;  Surgeon: Flora Lipps, MD;  Location: [facility];  Service: Cardiopulmonary;  Laterality: N/A;  . ENDOBRONCHIAL ULTRASOUND N/A 11/26/2015   Procedure: ENDOBRONCHIAL ULTRASOUND;  Surgeon: Flora Lipps, MD;  Location: [facility]S;  Service: Cardiopulmonary;  Laterality: N/A;  . PERIPHERAL VASCULAR CATHETERIZATION N/A 12/09/2015   Procedure: Glori Luis Cath Insertion;  Surgeon: Katha Cabal, MD;  Location: [place] [facility];  Service: Cardiovascular;  Laterality: N/A;    FAMILY HISTORY: Reviewed and unchanged. No reported history of malignancy or chronic disease.     ADVANCED DIRECTIVES:    HEALTH MAINTENANCE: Social History  Substance Use Topics  . Smoking status: Current Every Day Smoker    Packs/day: 0.30    Years: 35.00    Types: Cigarettes  . Smokeless tobacco: Never Used     Comment: workiing on quitting, down to 2 packs per week now  . Alcohol use No     No Known Allergies  Current Outpatient Prescriptions  Medication Sig Dispense Refill  . atorvastatin (LIPITOR) 20 MG tablet Take 20 mg by mouth daily at 6 PM.    . gabapentin (NEURONTIN) 300 MG capsule Take 1 capsule (300 mg total) by mouth at bedtime. 30 capsule 0  . insulin glargine (LANTUS) 100 UNIT/ML injection Inject 60 Units into the skin at bedtime.    . lidocaine-prilocaine (EMLA) cream Apply to affected area once 30 g 6  . linagliptin (TRADJENTA) 5 MG TABS tablet Take 5 mg by mouth daily.     Marland Kitchen  lisinopril (PRINIVIL,ZESTRIL) 10 MG tablet Take 10 mg by mouth daily.    . metFORMIN (GLUMETZA) 500 MG (MOD) 24 hr tablet Take 1,000 mg by mouth 2 (two) times daily with a meal. Reported on 04/27/2016    . morphine (MS CONTIN) 30 MG 12 hr tablet Take 1 tablet (30 mg total) by mouth every 12 (twelve) hours. 60  tablet 0  . ondansetron (ZOFRAN) 8 MG tablet Take 1 tablet (8 mg total) by mouth 2 (two) times daily. Start the day after chemo for 3 days. Then take as needed for nausea or vomiting. 30 tablet 3  . Oxycodone HCl 10 MG TABS Take 1 tablet (10 mg total) by mouth every 6 (six) hours as needed. 90 tablet 0  . prochlorperazine (COMPAZINE) 10 MG tablet Take 1 tablet (10 mg total) by mouth every 6 (six) hours as needed for nausea, vomiting or refractory nausea / vomiting (Nausea or vomiting). 30 tablet 3  . promethazine (PHENERGAN) 25 MG tablet Take 1 tablet (25 mg total) by mouth every 6 (six) hours as needed for nausea or vomiting. 30 tablet 0   No current facility-administered medications for this visit.     OBJECTIVE: There were no vitals filed for this visit.   There is no height or weight on file to calculate BMI.    ECOG FS:1 - Symptomatic but completely ambulatory  General: Well-developed, well-nourished, no acute distress. Eyes: Pink conjunctiva, anicteric sclera. Lungs: Clear to auscultation bilaterally. Heart: Regular rate and rhythm. No rubs, murmurs, or gallops. Abdomen: Soft, nontender, nondistended. No organomegaly noted, normoactive bowel sounds. Musculoskeletal: No edema, cyanosis, or clubbing. Neuro: Alert, answering all questions appropriately. Cranial nerves grossly intact. Skin: No rashes or petechiae noted. Psych: Normal affect.   LAB RESULTS:  Lab Results  Component Value Date   NA 137 09/15/2016   K 4.3 09/15/2016   CL 104 09/15/2016   CO2 25 09/15/2016   GLUCOSE 181 (H) 09/15/2016   BUN 12 09/15/2016   CREATININE 0.68 09/15/2016   CALCIUM 9.1 09/15/2016   PROT 6.6 09/15/2016   ALBUMIN 3.5 09/15/2016   AST 15 09/15/2016   ALT 14 09/15/2016   ALKPHOS 73 09/15/2016   BILITOT 0.6 09/15/2016   GFRNONAA >60 09/15/2016   GFRAA >60 09/15/2016    Lab Results  Component Value Date   WBC 3.4 (L) 09/15/2016   NEUTROABS 2.2 09/15/2016   HGB 10.0 (L) 09/15/2016    HCT 29.4 (L) 09/15/2016   MCV 87.9 09/15/2016   PLT 125 (L) 09/15/2016   Lab Results  Component Value Date   IRON 63 03/30/2016   TIBC 290 03/30/2016   IRONPCTSAT 22 03/30/2016    Lab Results  Component Value Date   FERRITIN 126 03/30/2016     STUDIES: No results found.  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 She then underwent 2 cycles of consolidation carboplatinum and Taxol completing on Apr 27, 2016. CT scan results from September 15, 2016 reviewed independently and reported as above. Patient has had interval development of new pulmonary nodules particularly in the outflow upper lobe. Concern for possible metastatic disease, but these are too small to characterize and are under the sensitivity of the PET scan. Continue to monitor closely and will repeat CT scan in 3 months with follow-up 1-2  days later.   2. Anemia: Improved with hemoglobin 10.0. Previously iron stores were within normal limits, monitor.  3. Thrombocytopenia: Mild, likely due to of chemotherapy. Monitor. 4. Hyperglycemia: Continue diabetic medications as prescribed. 5. Pain: Continue with PRN Oxycodone 10 mg as needed.    Patient expressed understanding and was in agreement with this plan. She also understands that She can call clinic at any time with any questions, concerns, or complaints.    [name], MD   12/19/2016 10:32 PM                        [place]  Telephone:(336) [phone] Fax:(336) [phone]  ID: Susan Willis OB: 10-Jan-1951  MR#: [id number]  KDT#:[id number]  Patient Care Team: Marguerita Merles, MD as PCP - General (Family Medicine) [name], [name] [name], [name] [name], RN as  Oncology Nurse Navigator  CHIEF COMPLAINT: Lung mass.  INTERVAL HISTORY: Patient returns to clinic today for further evaluation and consideration of cycle 2 of consolidation carboplatinum and Taxol. She continues to feel dizzy today. She has numbness in her right fingers and left foot that comes and goes and does not interfere with her daily activities and it is not painful. She is having difficulty sleeping at night. She still has pain in her knees, she takes pain medication as needed, usually 2-3 tabs daily. She has no other neurologic complaints. She denies any chest pain or shortness of breath. She denies constipation, diarrhea, nausea, or vomiting.  She denies any recent fevers or illnesses. She denies cough, or hemoptysis. She has no urinary complaints. Patient offers no further specific complaints today.  REVIEW OF SYSTEMS:   Review of Systems  Constitutional: Negative for fever, chills, weight loss, malaise/fatigue and diaphoresis.  HENT: Negative.   Eyes: Negative.   Respiratory: Negative for cough, hemoptysis, sputum production, shortness of breath and wheezing.   Cardiovascular: Negative for chest pain, palpitations, orthopnea, claudication, leg swelling and PND.  Gastrointestinal: Negative for heartburn, nausea, vomiting, abdominal pain, diarrhea, constipation, blood in stool and melena.  Genitourinary: Negative.   Musculoskeletal: Positive for joint pain. Negative for myalgias.  Skin: Negative.   Neurological: Positive for dizziness and sensory change. Negative for tingling, focal weakness, seizures and weakness.  Endo/Heme/Allergies: Does not bruise/bleed easily.  Psychiatric/Behavioral: Negative for depression. The patient has insomnia. The patient is not nervous/anxious.     As per HPI. Otherwise, a complete review of systems is negatve.  PAST MEDICAL HISTORY: Past Medical History:  Diagnosis Date  . Arthritis   . Blood dyscrasia   . Cancer of left lung ([place]) 08/2015    Rad + chemo tx's.  . Diabetes mellitus without complication ([place])   . Diabetic neuropathy ([place])   . High cholesterol   . Hypertension     PAST SURGICAL HISTORY: Past Surgical History:  Procedure Laterality Date  . ABDOMINAL HYSTERECTOMY    . CESAREAN SECTION    . ELECTROMAGNETIC NAVIGATION BROCHOSCOPY N/A 11/26/2015   Procedure: ELECTROMAGNETIC NAVIGATION BRONCHOSCOPY;  Surgeon: Flora Lipps, MD;  Location: [facility];  Service: Cardiopulmonary;  Laterality: N/A;  . ENDOBRONCHIAL ULTRASOUND N/A 11/26/2015   Procedure: ENDOBRONCHIAL ULTRASOUND;  Surgeon: Flora Lipps, MD;  Location: [facility];  Service: Cardiopulmonary;  Laterality: N/A;  . PERIPHERAL VASCULAR CATHETERIZATION N/A 12/09/2015   Procedure: Glori Luis Cath Insertion;  Surgeon: Katha Cabal, MD;  Location: [place] [facility];  Service: Cardiovascular;  Laterality: N/A;    FAMILY HISTORY: Reviewed and unchanged. No reported history of malignancy or chronic disease.     ADVANCED DIRECTIVES:    HEALTH MAINTENANCE: Social History  Substance Use Topics  . Smoking status: Current Every Day Smoker    Packs/day: 0.30    Years: 35.00    Types: Cigarettes  . Smokeless tobacco: Never Used     Comment: workiing on quitting, down to 2 packs per week now  . Alcohol use No     No Known Allergies  Current Outpatient Prescriptions  Medication Sig Dispense Refill  . atorvastatin (LIPITOR) 20 MG tablet Take 20 mg by mouth daily at 6 PM.    . gabapentin (NEURONTIN) 300 MG capsule Take 1 capsule (300 mg total) by mouth at bedtime. 30 capsule 0  . insulin glargine (LANTUS) 100 UNIT/ML injection Inject 60 Units into the skin at bedtime.    . lidocaine-prilocaine (EMLA) cream Apply to affected area once 30 g 6  . linagliptin (TRADJENTA) 5 MG TABS tablet Take 5 mg by mouth daily.     Marland Kitchen lisinopril (PRINIVIL,ZESTRIL) 10 MG tablet Take 10 mg by mouth daily.    . metFORMIN (GLUMETZA) 500 MG (MOD) 24 hr tablet Take 1,000 mg by mouth 2 (two)  times daily with a meal. Reported on 04/27/2016    . morphine (MS CONTIN) 30 MG 12 hr tablet Take 1 tablet (30 mg total) by mouth every 12 (twelve) hours. 60 tablet 0  . ondansetron (ZOFRAN) 8 MG tablet Take 1 tablet (8 mg total) by mouth 2 (two) times daily. Start the day after chemo for 3 days. Then take as needed for nausea or vomiting. 30 tablet 3  . Oxycodone HCl 10 MG TABS Take 1 tablet (10 mg total) by mouth every 6 (six) hours as needed. 90 tablet 0  . prochlorperazine (COMPAZINE) 10 MG tablet Take 1 tablet (10 mg total) by mouth every 6 (six) hours as needed for nausea, vomiting or refractory nausea / vomiting (Nausea or vomiting). 30 tablet 3  . promethazine (PHENERGAN) 25 MG tablet Take 1 tablet (25 mg total) by mouth every 6 (six) hours as needed for nausea or vomiting. 30 tablet 0   No current facility-administered medications for this visit.     OBJECTIVE: There were no vitals filed for this visit.   There is no height or weight on file to calculate BMI.    ECOG FS:1 - Symptomatic but completely ambulatory  General: Well-developed, well-nourished, no acute distress. Eyes: Pink conjunctiva, anicteric sclera. Lungs: Clear to auscultation bilaterally. Heart: Regular rate and rhythm. No rubs, murmurs, or gallops. Abdomen: Soft, nontender, nondistended. No organomegaly noted, normoactive bowel sounds. Musculoskeletal: No edema, cyanosis, or clubbing. Neuro: Alert, answering all questions appropriately. Cranial nerves grossly intact. Skin: No rashes or petechiae noted. Psych: Normal affect.   LAB RESULTS:  Lab Results  Component Value Date   NA 137 09/15/2016   K 4.3 09/15/2016   CL 104 09/15/2016   CO2 25 09/15/2016   GLUCOSE 181 (H) 09/15/2016   BUN 12 09/15/2016   CREATININE 0.68 09/15/2016   CALCIUM 9.1 09/15/2016   PROT 6.6 09/15/2016   ALBUMIN 3.5 09/15/2016   AST 15 09/15/2016   ALT 14 09/15/2016   ALKPHOS 73 09/15/2016   BILITOT 0.6 09/15/2016   GFRNONAA >60  09/15/2016   GFRAA >60 09/15/2016    Lab Results  Component Value Date   WBC 3.4 (L) 09/15/2016   NEUTROABS  2.2 09/15/2016   HGB 10.0 (L) 09/15/2016   HCT 29.4 (L) 09/15/2016   MCV 87.9 09/15/2016   PLT 125 (L) 09/15/2016   Lab Results  Component Value Date   IRON 63 03/30/2016   TIBC 290 03/30/2016   IRONPCTSAT 22 03/30/2016    Lab Results  Component Value Date   FERRITIN 126 03/30/2016     STUDIES: No results found.  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  PLAN:    1. 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 PET scan results reviewed independently and reported as above with marked response to therapy with minimal residual soft tissue abnormality. No further intervention is needed at this time. Return to clinic in 3 months with repeat imaging with CT scan and further evaluation.  2. Pancytopenia: Secondary to chemotherapy, monitor. Previously, patient's iron stores were within normal limits.  3. Hyperglycemia: Patient's blood glucose remained persistently elevated. Continue current diabetic medications as prescribed.  4. Peripheral neuropathy: Secondary to Taxol. Continue gabapentin as prescribed. 5. Insomnia: Continue over-the-counter sleep aid as needed. 6. Pain: Patient does not complain of this today. Continue oxycodone as needed.     Patient expressed understanding and was in agreement with this plan. She also understands that She can call clinic at any time with any questions, concerns, or complaints.    [name], MD   12/19/2016 10:32 PM

## 2016-12-21 ENCOUNTER — Inpatient Hospital Stay: Payer: PPO | Attending: Oncology

## 2016-12-21 ENCOUNTER — Ambulatory Visit
Admission: RE | Admit: 2016-12-21 | Discharge: 2016-12-21 | Disposition: A | Discharge status: Home / Self Care | Payer: PPO | Source: Ambulatory Visit | Attending: Oncology | Admitting: Oncology

## 2016-12-21 DIAGNOSIS — C3412 Malignant neoplasm of upper lobe, left bronchus or lung: Secondary | ICD-10-CM | POA: Insufficient documentation

## 2016-12-21 DIAGNOSIS — D649 Anemia, unspecified: Secondary | ICD-10-CM | POA: Insufficient documentation

## 2016-12-21 DIAGNOSIS — Z79899 Other long term (current) drug therapy: Secondary | ICD-10-CM | POA: Insufficient documentation

## 2016-12-21 DIAGNOSIS — Z9221 Personal history of antineoplastic chemotherapy: Secondary | ICD-10-CM | POA: Insufficient documentation

## 2016-12-21 DIAGNOSIS — D696 Thrombocytopenia, unspecified: Secondary | ICD-10-CM | POA: Insufficient documentation

## 2016-12-21 DIAGNOSIS — Z923 Personal history of irradiation: Secondary | ICD-10-CM | POA: Insufficient documentation

## 2016-12-22 ENCOUNTER — Inpatient Hospital Stay: Payer: PPO | Admitting: Oncology

## 2016-12-22 ENCOUNTER — Inpatient Hospital Stay: Payer: PPO

## 2016-12-28 DIAGNOSIS — C3412 Malignant neoplasm of upper lobe, left bronchus or lung: Secondary | ICD-10-CM | POA: Diagnosis not present

## 2016-12-28 DIAGNOSIS — F17200 Nicotine dependence, unspecified, uncomplicated: Secondary | ICD-10-CM | POA: Diagnosis not present

## 2016-12-28 DIAGNOSIS — E119 Type 2 diabetes mellitus without complications: Secondary | ICD-10-CM | POA: Diagnosis not present

## 2016-12-28 DIAGNOSIS — Z1231 Encounter for screening mammogram for malignant neoplasm of breast: Secondary | ICD-10-CM | POA: Diagnosis not present

## 2016-12-28 DIAGNOSIS — F4320 Adjustment disorder, unspecified: Secondary | ICD-10-CM | POA: Diagnosis not present

## 2016-12-28 DIAGNOSIS — R7309 Other abnormal glucose: Secondary | ICD-10-CM | POA: Diagnosis not present

## 2017-01-03 ENCOUNTER — Ambulatory Visit
Admission: RE | Admit: 2017-01-03 | Discharge: 2017-01-03 | Disposition: A | Discharge status: Home / Self Care | Payer: PPO | Source: Ambulatory Visit | Attending: Oncology | Admitting: Oncology

## 2017-01-03 ENCOUNTER — Inpatient Hospital Stay: Payer: PPO

## 2017-01-03 DIAGNOSIS — D649 Anemia, unspecified: Secondary | ICD-10-CM | POA: Diagnosis not present

## 2017-01-03 DIAGNOSIS — Z95828 Presence of other vascular implants and grafts: Secondary | ICD-10-CM

## 2017-01-03 DIAGNOSIS — Z923 Personal history of irradiation: Secondary | ICD-10-CM | POA: Diagnosis not present

## 2017-01-03 DIAGNOSIS — R918 Other nonspecific abnormal finding of lung field: Secondary | ICD-10-CM | POA: Diagnosis not present

## 2017-01-03 DIAGNOSIS — R590 Localized enlarged lymph nodes: Secondary | ICD-10-CM | POA: Insufficient documentation

## 2017-01-03 DIAGNOSIS — Z9221 Personal history of antineoplastic chemotherapy: Secondary | ICD-10-CM | POA: Diagnosis not present

## 2017-01-03 DIAGNOSIS — C3412 Malignant neoplasm of upper lobe, left bronchus or lung: Secondary | ICD-10-CM | POA: Diagnosis not present

## 2017-01-03 DIAGNOSIS — Z79899 Other long term (current) drug therapy: Secondary | ICD-10-CM | POA: Diagnosis not present

## 2017-01-03 DIAGNOSIS — D696 Thrombocytopenia, unspecified: Secondary | ICD-10-CM | POA: Diagnosis not present

## 2017-01-03 LAB — COMPREHENSIVE METABOLIC PANEL
ALBUMIN: 3.4 g/dL — AB (ref 3.5–5.0)
ALT: 14 U/L (ref 14–54)
AST: 18 U/L (ref 15–41)
Alkaline Phosphatase: 76 U/L (ref 38–126)
Anion gap: 6 (ref 5–15)
BILIRUBIN TOTAL: 0.4 mg/dL (ref 0.3–1.2)
BUN: 13 mg/dL (ref 6–20)
CHLORIDE: 109 mmol/L (ref 101–111)
CO2: 25 mmol/L (ref 22–32)
Calcium: 9.2 mg/dL (ref 8.9–10.3)
Creatinine, Ser: 0.77 mg/dL (ref 0.44–1.00)
GFR calc Af Amer: >60 mL/min (ref >60)
GFR calc non Af Amer: >60 mL/min (ref >60)
GLUCOSE: 192 mg/dL — AB (ref 65–99)
POTASSIUM: 4.1 mmol/L (ref 3.5–5.1)
Sodium: 140 mmol/L (ref 135–145)
TOTAL PROTEIN: 6.6 g/dL (ref 6.5–8.1)

## 2017-01-03 LAB — CBC WITH DIFFERENTIAL/PLATELET
BASOS ABS: 0 10*3/uL (ref 0–0.1)
BASOS PCT: 0 %
EOS ABS: 0 10*3/uL (ref 0–0.7)
EOS PCT: 1 %
HCT: 30.9 % — ABNORMAL LOW (ref 35.0–47.0)
Hemoglobin: 10.4 g/dL — ABNORMAL LOW (ref 12.0–16.0)
Lymphocytes Relative: 24 %
Lymphs Abs: 0.9 10*3/uL — ABNORMAL LOW (ref 1.0–3.6)
MCH: 29.6 pg (ref 26.0–34.0)
MCHC: 33.6 g/dL (ref 32.0–36.0)
MCV: 88.1 fL (ref 80.0–100.0)
MONOS PCT: 8 %
Monocytes Absolute: 0.3 10*3/uL (ref 0.2–0.9)
Neutro Abs: 2.5 10*3/uL (ref 1.4–6.5)
Neutrophils Relative %: 67 %
PLATELETS: 132 10*3/uL — AB (ref 150–440)
RBC: 3.5 MIL/uL — ABNORMAL LOW (ref 3.80–5.20)
RDW: 14.7 % — AB (ref 11.5–14.5)
WBC: 3.8 10*3/uL (ref 3.6–11.0)

## 2017-01-03 MED ORDER — IOPAMIDOL (ISOVUE-300) INJECTION 61%
75.0000 mL | Freq: Once | INTRAVENOUS | Status: AC | PRN
  Administered 2017-01-03: 75 mL via INTRAVENOUS

## 2017-01-03 MED ORDER — HEPARIN SOD (PORK) LOCK FLUSH 100 UNIT/ML IV SOLN
500.0000 [IU] | Freq: Once | INTRAVENOUS | Status: AC
  Administered 2017-01-03: 500 [IU] via INTRAVENOUS

## 2017-01-03 MED ORDER — SODIUM CHLORIDE 0.9% FLUSH
10.0000 mL | INTRAVENOUS | Status: DC | PRN
  Administered 2017-01-03: 10 mL via INTRAVENOUS
  Filled 2017-01-03: qty 10

## 2017-01-04 NOTE — Progress Notes (Signed)
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  INTERVAL HISTORY: Patient returns to clinic today for further evaluation and discussion of her imaging results. She has persistent left flank pain, but otherwise feels well. She does not complain of peripheral neuropathy today. She otherwise feels well. She has no other neurologic complaints. She denies any recent fevers or illnesses. She denies any chest pain, cough, hemoptysis, or shortness of breath. She denies constipation, diarrhea, nausea, or vomiting.  She has no urinary complaints. Patient offers no further specific complaints today.  REVIEW OF SYSTEMS:   Review of Systems  Constitutional: Negative.  Negative for fever, malaise/fatigue and weight loss.  Respiratory: Negative.  Negative for cough, hemoptysis and shortness of breath.   Cardiovascular: Negative.  Negative for chest pain and leg swelling.  Gastrointestinal: Negative.  Negative for abdominal pain.  Genitourinary: Positive for flank pain.  Musculoskeletal: Positive for joint pain.  Neurological: Negative for sensory change and weakness.  Psychiatric/Behavioral: Negative.  The patient is not nervous/anxious.     As per HPI. Otherwise, a complete review of systems is negative.  PAST MEDICAL HISTORY: Past Medical History:  Diagnosis Date  . Arthritis   . Blood dyscrasia   . Cancer of left lung ([place]) 08/2015   Rad + chemo tx's.  . Diabetes mellitus without complication ([place])   . Diabetic neuropathy ([place])   . High cholesterol   . Hypertension      PAST SURGICAL HISTORY: Past Surgical History:  Procedure Laterality Date  . ABDOMINAL HYSTERECTOMY    . CESAREAN SECTION    . ELECTROMAGNETIC NAVIGATION BROCHOSCOPY N/A 11/26/2015   Procedure: ELECTROMAGNETIC NAVIGATION BRONCHOSCOPY;  Surgeon: Flora Lipps, MD;  Location: [facility];  Service: Cardiopulmonary;  Laterality: N/A;  . ENDOBRONCHIAL ULTRASOUND N/A 11/26/2015   Procedure: ENDOBRONCHIAL ULTRASOUND;  Surgeon: Flora Lipps, MD;  Location: [facility];  Service: Cardiopulmonary;  Laterality: N/A;  . PERIPHERAL VASCULAR CATHETERIZATION N/A 12/09/2015   Procedure: Glori Luis Cath Insertion;  Surgeon: Katha Cabal, MD;  Location: [place] [facility];  Service: Cardiovascular;  Laterality: N/A;    FAMILY HISTORY: Reviewed and unchanged. No reported history of malignancy or chronic disease.     ADVANCED DIRECTIVES:    HEALTH MAINTENANCE: Social History  Substance Use Topics  . Smoking status: Current Every Day Smoker    Packs/day: 0.30    Years: 35.00    Types: Cigarettes  . Smokeless tobacco: Never Used     Comment: workiing on quitting, down to 2 packs per week now  . Alcohol use No     No Known Allergies  Current Outpatient Prescriptions  Medication Sig Dispense Refill  . insulin glargine (LANTUS) 100 UNIT/ML injection Inject 60 Units into the skin at bedtime.    . lidocaine-prilocaine (EMLA) cream Apply to affected area once 30 g 6  . linagliptin (TRADJENTA) 5 MG TABS tablet Take 5 mg by mouth daily.     Marland Kitchen lisinopril (PRINIVIL,ZESTRIL) 10 MG tablet Take 10 mg by mouth daily.    Marland Kitchen atorvastatin (LIPITOR) 20 MG tablet Take 20 mg by mouth daily at 6 PM.    .  gabapentin (NEURONTIN) 300 MG capsule Take 1 capsule (300 mg total) by mouth at bedtime. (Patient not taking: Reported on 01/05/2017) 30 capsule 0  . traMADol (ULTRAM) 50 MG tablet Take 1 tablet (50 mg total) by mouth 2 (two) times daily. 60 tablet 2   No current facility-administered medications for this visit.      OBJECTIVE: Vitals:   01/05/17 0923  BP: 122/77  Pulse: 80  Temp: 97.3 F (36.3 C)     Body mass index is 35.84 kg/m.    ECOG FS:1 - Symptomatic but completely ambulatory  General: Well-developed, well-nourished, no acute distress. Eyes: Pink conjunctiva, anicteric sclera. Lungs: Clear to auscultation bilaterally. Heart: Regular rate and rhythm. No rubs, murmurs, or gallops. Abdomen: Soft, nontender, nondistended. No organomegaly noted, normoactive bowel sounds. Musculoskeletal: No edema, cyanosis, or clubbing. Neuro: Alert, answering all questions appropriately. Cranial nerves grossly intact. Skin: No rashes or petechiae noted. Psych: Normal affect.   LAB RESULTS:  Lab Results  Component Value Date   NA 140 01/03/2017   K 4.1 01/03/2017   CL 109 01/03/2017   CO2 25 01/03/2017   GLUCOSE 192 (H) 01/03/2017   BUN 13 01/03/2017   CREATININE 0.77 01/03/2017   CALCIUM 9.2 01/03/2017   PROT 6.6 01/03/2017   ALBUMIN 3.4 (L) 01/03/2017   AST 18 01/03/2017   ALT 14 01/03/2017   ALKPHOS 76 01/03/2017   BILITOT 0.4 01/03/2017   GFRNONAA >60 01/03/2017   GFRAA >60 01/03/2017    Lab Results  Component Value Date   WBC 3.8 01/03/2017   NEUTROABS 2.5 01/03/2017   HGB 10.4 (L) 01/03/2017   HCT 30.9 (L) 01/03/2017   MCV 88.1 01/03/2017   PLT 132 (L) 01/03/2017   Lab Results  Component Value Date   IRON 63 03/30/2016   TIBC 290 03/30/2016   IRONPCTSAT 22 03/30/2016    Lab Results  Component Value Date   FERRITIN 126 03/30/2016     STUDIES: Ct Chest W Contrast  Result Date: 01/03/2017 CLINICAL DATA:  LEFT upper lobe neoplasm. LEFT-sided pain. Current smoker EXAM: CT CHEST WITH CONTRAST TECHNIQUE: Multidetector CT imaging of the chest was performed during intravenous contrast administration. CONTRAST:  59m ISOVUE-300 IOPAMIDOL (ISOVUE-300) INJECTION 61% COMPARISON:  09/15/2016 FINDINGS: Cardiovascular: Coronary artery calcification and aortic atherosclerotic  calcification. Mediastinum/Nodes: Prevascular lymph node measuring 20 mm x 19 mm (image 57, series 2) is increased in size from 19 x 12 mm. Other small mediastinal lymph nodes are similar in size. Lungs/Pleura: LEFT upper lobe pulmonary nodule measures 14 mm x 9 mm (image 49, series 3) compared to 15 mm x 8 mm. Additional bilateral sub 5 mm pulmonary nodules are not changed significantly prior. Example 5 mm nodule in the LEFT upper lobe (image 74, series 3). 5 mm nodule RIGHT lower lobe (image 90, series 3). No clear pulmonary nodules There is underlying parabronchovascular thickening in the the LEFT upper lobe and LEFT lower lobe however also in RIGHT lung. Upper Abdomen: Limited view of the liver, kidneys, pancreas are unremarkable. Normal adrenal glands. Musculoskeletal: No aggressive osseous lesion IMPRESSION: 1. Interval increase in size of prevascular lymph node adjacent to the LEFT hilum. Findings concerning for lung cancer recurrence. Recommend FDG PET scan further evaluation. 2.   Stable LEFT upper lobe pulmonary nodule. 3. Additional scattered pulmonary nodules and bronchovascular thickening unchanged. Electronically Signed   By: SSuzy BouchardM.D.   On: 01/03/2017 16:44    ASSESSMENT: Stage IIIa squamous cell carcinoma of the upper lobe  of left lung   PLAN:    1. 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 She then underwent 2 cycles of consolidation carboplatinum and Taxol completing on Apr 27, 2016. CT scan results from January 03, 2017 reviewed independently and reported as above. The pulmonary nodules are essentially unchanged, but patient has an enlarging prevascular lymph node in the left hilum. Patient was discussed at length at cancer conference. Will get a PET scan in the next 1-2 weeks for further evaluation and then consider XRT if positive. Return to clinic 1 to 2 days after PET scan for further  evaluation.  2. Anemia: Improving. Previously iron stores were within normal limits, monitor.  3. Thrombocytopenia: Mild, likely due to of chemotherapy. Monitor. 4. Hyperglycemia: Continue diabetic medications as prescribed. 5. Pain: Appears to be musculoskeletal in nature. Recommended patient discontinue oxycodone and she was given a prescription for tramadol.  Patient expressed understanding and was in agreement with this plan. She also understands that She can call clinic at any time with any questions, concerns, or complaints.    [name], MD   01/08/2017 8:22 AM

## 2017-01-05 ENCOUNTER — Inpatient Hospital Stay: Payer: PPO | Attending: Oncology | Admitting: Oncology

## 2017-01-05 VITALS — BP 122/77 | HR 80 | Temp 97.3°F | Ht 66.0 in | Wt 222.1 lb

## 2017-01-05 DIAGNOSIS — M1990 Unspecified osteoarthritis, unspecified site: Secondary | ICD-10-CM | POA: Insufficient documentation

## 2017-01-05 DIAGNOSIS — Z794 Long term (current) use of insulin: Secondary | ICD-10-CM | POA: Diagnosis not present

## 2017-01-05 DIAGNOSIS — C3412 Malignant neoplasm of upper lobe, left bronchus or lung: Secondary | ICD-10-CM | POA: Insufficient documentation

## 2017-01-05 DIAGNOSIS — Z79899 Other long term (current) drug therapy: Secondary | ICD-10-CM | POA: Insufficient documentation

## 2017-01-05 DIAGNOSIS — E1165 Type 2 diabetes mellitus with hyperglycemia: Secondary | ICD-10-CM | POA: Insufficient documentation

## 2017-01-05 DIAGNOSIS — I10 Essential (primary) hypertension: Secondary | ICD-10-CM | POA: Insufficient documentation

## 2017-01-05 DIAGNOSIS — Z9221 Personal history of antineoplastic chemotherapy: Secondary | ICD-10-CM | POA: Insufficient documentation

## 2017-01-05 DIAGNOSIS — E7800 Pure hypercholesterolemia, unspecified: Secondary | ICD-10-CM | POA: Insufficient documentation

## 2017-01-05 DIAGNOSIS — Z923 Personal history of irradiation: Secondary | ICD-10-CM | POA: Insufficient documentation

## 2017-01-05 DIAGNOSIS — F17210 Nicotine dependence, cigarettes, uncomplicated: Secondary | ICD-10-CM | POA: Diagnosis not present

## 2017-01-05 DIAGNOSIS — D649 Anemia, unspecified: Secondary | ICD-10-CM | POA: Insufficient documentation

## 2017-01-05 MED ORDER — TRAMADOL HCL 50 MG PO TABS: 50.0000 mg | ORAL_TABLET | Freq: Two times a day (BID) | ORAL | 2 refills | Status: DC

## 2017-01-05 NOTE — Progress Notes (Signed)
atient here for follow up. She has intermittant pain in her left side. No pain today.

## 2017-01-06 ENCOUNTER — Telehealth: Payer: Self-pay | Admitting: *Deleted

## 2017-01-06 DIAGNOSIS — C3412 Malignant neoplasm of upper lobe, left bronchus or lung: Secondary | ICD-10-CM

## 2017-01-06 NOTE — Telephone Encounter (Signed)
Pt was discussed at tumor case conference and it was recommended that pt pursue PET scan for further evaluation. Pt has been made aware. Message sent to schedule to schedule pt for PET scan then follow up with Dr. Grayland Ormond. Pt verbalized understanding.

## 2017-01-12 DIAGNOSIS — F4320 Adjustment disorder, unspecified: Secondary | ICD-10-CM | POA: Diagnosis not present

## 2017-01-13 ENCOUNTER — Encounter
Admission: RE | Admit: 2017-01-13 | Discharge: 2017-01-13 | Disposition: A | Discharge status: Home / Self Care | Payer: PPO | Source: Ambulatory Visit | Attending: Oncology | Admitting: Oncology

## 2017-01-13 DIAGNOSIS — C3412 Malignant neoplasm of upper lobe, left bronchus or lung: Secondary | ICD-10-CM

## 2017-01-13 LAB — GLUCOSE, CAPILLARY: Glucose-Capillary: 193 mg/dL — ABNORMAL HIGH (ref 65–99)

## 2017-01-13 MED ORDER — FLUDEOXYGLUCOSE F - 18 (FDG) INJECTION
12.1200 | Freq: Once | INTRAVENOUS | Status: AC | PRN
  Administered 2017-01-13: 12.12 via INTRAVENOUS

## 2017-01-19 ENCOUNTER — Telehealth: Payer: Self-pay | Admitting: *Deleted

## 2017-01-19 NOTE — Telephone Encounter (Signed)
Take one half tramadol and see if dizziness improves.

## 2017-01-19 NOTE — Telephone Encounter (Signed)
Reports that Dr Grayland Ormond changed her Oxycodone to Tramadol and it is making her dizzy and she feels she has no energy. Asking to be put back on Oxycodone. Please advise

## 2017-01-19 NOTE — Telephone Encounter (Signed)
Advised to take 1/2 tablet to see if that helps, she said she will try it

## 2017-02-06 ENCOUNTER — Other Ambulatory Visit: Payer: Self-pay | Admitting: *Deleted

## 2017-02-06 DIAGNOSIS — C3412 Malignant neoplasm of upper lobe, left bronchus or lung: Secondary | ICD-10-CM

## 2017-02-06 NOTE — Telephone Encounter (Signed)
She has refills on current Rx

## 2017-02-23 DIAGNOSIS — F4320 Adjustment disorder, unspecified: Secondary | ICD-10-CM | POA: Diagnosis not present

## 2017-02-23 DIAGNOSIS — I10 Essential (primary) hypertension: Secondary | ICD-10-CM | POA: Diagnosis not present

## 2017-02-23 DIAGNOSIS — R7309 Other abnormal glucose: Secondary | ICD-10-CM | POA: Diagnosis not present

## 2017-02-23 DIAGNOSIS — E119 Type 2 diabetes mellitus without complications: Secondary | ICD-10-CM | POA: Diagnosis not present

## 2017-02-23 DIAGNOSIS — G932 Benign intracranial hypertension: Secondary | ICD-10-CM | POA: Diagnosis not present

## 2017-03-02 ENCOUNTER — Encounter: Payer: Self-pay | Admitting: Radiation Oncology

## 2017-03-02 ENCOUNTER — Ambulatory Visit
Admission: RE | Admit: 2017-03-02 | Discharge: 2017-03-02 | Disposition: A | Discharge status: Home / Self Care | Payer: PPO | Source: Ambulatory Visit | Attending: Radiation Oncology | Admitting: Radiation Oncology

## 2017-03-02 VITALS — BP 145/83 | HR 77 | Temp 95.3°F | Resp 20 | Wt 238.8 lb

## 2017-03-02 DIAGNOSIS — R05 Cough: Secondary | ICD-10-CM | POA: Insufficient documentation

## 2017-03-02 DIAGNOSIS — C3412 Malignant neoplasm of upper lobe, left bronchus or lung: Secondary | ICD-10-CM

## 2017-03-02 DIAGNOSIS — Z9221 Personal history of antineoplastic chemotherapy: Secondary | ICD-10-CM | POA: Diagnosis not present

## 2017-03-02 DIAGNOSIS — F17210 Nicotine dependence, cigarettes, uncomplicated: Secondary | ICD-10-CM | POA: Insufficient documentation

## 2017-03-02 DIAGNOSIS — Z923 Personal history of irradiation: Secondary | ICD-10-CM | POA: Insufficient documentation

## 2017-03-02 NOTE — Progress Notes (Signed)
Radiation Oncology Follow up Note  Name: Susan Willis   Date:   03/02/2017 MRN:  [id number] DOB: [birth year]    This 66 y.o. 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  REFERRING PROVIDER: Marguerita Merles, MD  HPI: Patient is a 66 year old female now out 10 months having completed combined modality treatment with chemotherapy and radiation therapy for a T4 N2 M0 non-small cell lung cancer of the left lung. She is seen today in routine follow-up is doing fairly well she does have a mild slightly productive cough no hemoptysis. She specifically denies dyspnea on exertion or marked shortness of breath.. She was noted to have a enlarging left hilar lymph node back in February PET CT scan demonstrated hypermetabolic activity in this region. She is seen today in routine follow-up.  COMPLICATIONS OF TREATMENT: none  FOLLOW UP COMPLIANCE: keeps appointments   PHYSICAL EXAM:  BP (!) 145/83   Pulse 77   Temp (!) 95.3 F (35.2 C)   Resp 20   Wt 238 lb 12.1 oz (108.3 kg)   BMI 38.54 kg/m  Well-developed well-nourished patient in NAD. HEENT reveals PERLA, EOMI, discs not visualized.  Oral cavity is clear. No oral mucosal lesions are identified. Neck is clear without evidence of cervical or supraclavicular adenopathy. Lungs are clear to A&P. Cardiac examination is essentially unremarkable with regular rate and rhythm without murmur rub or thrill. Abdomen is benign with no organomegaly or masses noted. Motor sensory and DTR levels are equal and symmetric in the upper and lower extremities. Cranial nerves II through XII are grossly intact. Proprioception is intact. No peripheral adenopathy or edema is identified. No motor or sensory levels are noted. Crude visual fields are within normal range.  RADIOLOGY RESULTS: CT scans and PET/CT scans reviewed and compatible with the above-stated  findings  PLAN: At this time am referring patient back to Dr. Grayland Ormond for evaluation. Based on her enlarging and hyperbolic metabolic left hilar lymph node she may need either biopsy or to go ahead with salvage chemotherapy. Should this area continue to grow I always offer further radiation therapy to this area if indicated. Patient was set up for follow-up with medical oncology. Will discuss the case personally with medical oncology. Patient is to call with any concerns.  I would like to take this opportunity to thank you for allowing me to participate in the care of your patient.Armstead Peaks., MD

## 2017-03-06 NOTE — Progress Notes (Signed)
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  INTERVAL HISTORY: Patient returns to clinic today for further evaluation and discussion of her imaging results. She has noted some dizziness which started this morning, but otherwise feels well. She has no other neurologic complaints. She does not complain of peripheral neuropathy today. She denies any recent fevers or illnesses. She denies any chest pain, cough, hemoptysis, or shortness of breath. She denies constipation, diarrhea, nausea, or vomiting.  She has no urinary complaints. Patient offers no further specific complaints today.  REVIEW OF SYSTEMS:   Review of Systems  Constitutional: Negative.  Negative for fever, malaise/fatigue and weight loss.  Respiratory: Negative.  Negative for cough, hemoptysis and shortness of breath.   Cardiovascular: Negative.  Negative for chest pain and leg swelling.  Gastrointestinal: Negative.  Negative for abdominal pain.  Genitourinary: Positive for flank pain.  Musculoskeletal: Positive for joint pain.  Neurological: Positive for dizziness. Negative for sensory change and weakness.  Psychiatric/Behavioral: Negative.  The patient is not nervous/anxious.     As per HPI. Otherwise, a complete review of systems is negative.  PAST MEDICAL HISTORY: Past Medical History:  Diagnosis Date  . Arthritis   . Blood dyscrasia   . Cancer of left lung ([place]) 08/2015   Rad + chemo tx's.  . Diabetes mellitus without complication ([place])   . Diabetic neuropathy ([place])   . High cholesterol   .  Hypertension     PAST SURGICAL HISTORY: Past Surgical History:  Procedure Laterality Date  . ABDOMINAL HYSTERECTOMY    . CESAREAN SECTION    . ELECTROMAGNETIC NAVIGATION BROCHOSCOPY N/A 11/26/2015   Procedure: ELECTROMAGNETIC NAVIGATION BRONCHOSCOPY;  Surgeon: Flora Lipps, MD;  Location: [facility] OR[facility];  Service: Cardiopulmonary;  Laterality: N/A;  . ENDOBRONCHIAL ULTRASOUND N/A 11/26/2015   Procedure: ENDOBRONCHIAL ULTRASOUND;  Surgeon: Flora Lipps, MD;  Location: [facility] ORS;  Service: Cardiopulmonary;  Laterality: N/A;  . PERIPHERAL VASCULAR CATHETERIZATION N/A 12/09/2015   Procedure: Glori Luis Cath Insertion;  Surgeon: Katha Cabal, MD;  Location: [place] [facility];  Service: Cardiovascular;  Laterality: N/A;    FAMILY HISTORY: Reviewed and unchanged. No reported history of malignancy or chronic disease.     ADVANCED DIRECTIVES:    HEALTH MAINTENANCE: Social History  Substance Use Topics  . Smoking status: Current Every Day Smoker    Packs/day: 0.30    Years: 35.00    Types: Cigarettes  . Smokeless tobacco: Never Used     Comment: workiing on quitting, down to 2 packs per week now  . Alcohol use No     No Known Allergies  Current Outpatient Prescriptions  Medication Sig Dispense Refill  . atorvastatin (LIPITOR) 20 MG tablet Take 20 mg by mouth daily at 6 PM.    . gabapentin (NEURONTIN) 300 MG capsule Take 1 capsule (300 mg total) by mouth at bedtime. 30 capsule 0  . insulin glargine (LANTUS) 100 UNIT/ML injection Inject 60 Units into the skin at bedtime.    . lidocaine-prilocaine (EMLA) cream Apply to affected area once 30 g 6  . linagliptin (TRADJENTA) 5 MG TABS tablet Take 5 mg  by mouth daily.     Marland Kitchen lisinopril (PRINIVIL,ZESTRIL) 10 MG tablet Take 10 mg by mouth daily.    . traMADol (ULTRAM) 50 MG tablet Take 1 tablet (50 mg total) by mouth 2 (two) times daily. 60 tablet 2   No current facility-administered medications for this visit.     OBJECTIVE: Vitals:    03/07/17 1439  BP: 135/78  Pulse: 80  Resp: 18  Temp: (!) 96.3 F (35.7 C)     Body mass index is 38.98 kg/m.    ECOG FS:1 - Symptomatic but completely ambulatory  General: Well-developed, well-nourished, no acute distress. Eyes: Pink conjunctiva, anicteric sclera. Lungs: Clear to auscultation bilaterally. Heart: Regular rate and rhythm. No rubs, murmurs, or gallops. Abdomen: Soft, nontender, nondistended. No organomegaly noted, normoactive bowel sounds. Musculoskeletal: No edema, cyanosis, or clubbing. Neuro: Alert, answering all questions appropriately. Cranial nerves grossly intact. Skin: No rashes or petechiae noted. Psych: Normal affect.   LAB RESULTS:  Lab Results  Component Value Date   NA 138 03/07/2017   K 4.0 03/07/2017   CL 108 03/07/2017   CO2 26 03/07/2017   GLUCOSE 227 (H) 03/07/2017   BUN 14 03/07/2017   CREATININE 0.75 03/07/2017   CALCIUM 9.1 03/07/2017   PROT 6.6 03/07/2017   ALBUMIN 3.4 (L) 03/07/2017   AST 18 03/07/2017   ALT 15 03/07/2017   ALKPHOS 67 03/07/2017   BILITOT 0.5 03/07/2017   GFRNONAA >60 03/07/2017   GFRAA >60 03/07/2017    Lab Results  Component Value Date   WBC 3.8 03/07/2017   NEUTROABS 2.5 03/07/2017   HGB 10.5 (L) 03/07/2017   HCT 31.1 (L) 03/07/2017   MCV 87.6 03/07/2017   PLT 132 (L) 03/07/2017   Lab Results  Component Value Date   IRON 63 03/30/2016   TIBC 290 03/30/2016   IRONPCTSAT 22 03/30/2016    Lab Results  Component Value Date   FERRITIN 126 03/30/2016     STUDIES: No results found.  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 She then underwent 2 cycles of consolidation carboplatinum and Taxol completing on Apr 27, 2016. PET scan results reviewed independently highly suspicious for recurrent disease locally in her  mediastinum. Patient was discussed at tumor board, and the consensus was to proceed with every 3 weeks concurrent chemotherapy and XRT. It is unclear the utility of Durvalumab in this setting. Return to clinic in approximately 2 weeks to initiate treatment.   2. Anemia: Improving. Previously iron stores were within normal limits, monitor.  3. Thrombocytopenia: Mild, likely due to of chemotherapy. Monitor. 4. Hyperglycemia: Continue diabetic medications as prescribed. 5. Pain: Appears to be musculoskeletal in nature. Continue tramadol as needed.  6. Dizziness: If persists, consider MRI the brain.  Approximately 30 minutes was spent in discussion of which greater than 50% was consultation.   Patient expressed understanding and was in agreement with this plan. She also understands that She can call clinic at any time with any questions, concerns, or complaints.    [name], MD   03/12/2017 8:18 AM

## 2017-03-07 ENCOUNTER — Inpatient Hospital Stay: Payer: PPO | Attending: Oncology

## 2017-03-07 ENCOUNTER — Other Ambulatory Visit: Payer: Self-pay | Admitting: *Deleted

## 2017-03-07 ENCOUNTER — Inpatient Hospital Stay (HOSPITAL_BASED_OUTPATIENT_CLINIC_OR_DEPARTMENT_OTHER): Payer: PPO | Admitting: Oncology

## 2017-03-07 VITALS — BP 135/78 | HR 80 | Temp 96.3°F | Resp 18 | Wt 241.5 lb

## 2017-03-07 DIAGNOSIS — F17210 Nicotine dependence, cigarettes, uncomplicated: Secondary | ICD-10-CM

## 2017-03-07 DIAGNOSIS — E119 Type 2 diabetes mellitus without complications: Secondary | ICD-10-CM | POA: Diagnosis not present

## 2017-03-07 DIAGNOSIS — Z923 Personal history of irradiation: Secondary | ICD-10-CM

## 2017-03-07 DIAGNOSIS — D696 Thrombocytopenia, unspecified: Secondary | ICD-10-CM | POA: Diagnosis not present

## 2017-03-07 DIAGNOSIS — D649 Anemia, unspecified: Secondary | ICD-10-CM | POA: Insufficient documentation

## 2017-03-07 DIAGNOSIS — Z9221 Personal history of antineoplastic chemotherapy: Secondary | ICD-10-CM | POA: Diagnosis not present

## 2017-03-07 DIAGNOSIS — C3412 Malignant neoplasm of upper lobe, left bronchus or lung: Secondary | ICD-10-CM

## 2017-03-07 DIAGNOSIS — Z79899 Other long term (current) drug therapy: Secondary | ICD-10-CM | POA: Diagnosis not present

## 2017-03-07 DIAGNOSIS — E7800 Pure hypercholesterolemia, unspecified: Secondary | ICD-10-CM | POA: Insufficient documentation

## 2017-03-07 DIAGNOSIS — R42 Dizziness and giddiness: Secondary | ICD-10-CM | POA: Diagnosis not present

## 2017-03-07 DIAGNOSIS — I10 Essential (primary) hypertension: Secondary | ICD-10-CM | POA: Insufficient documentation

## 2017-03-07 DIAGNOSIS — M1990 Unspecified osteoarthritis, unspecified site: Secondary | ICD-10-CM | POA: Insufficient documentation

## 2017-03-07 DIAGNOSIS — Z95828 Presence of other vascular implants and grafts: Secondary | ICD-10-CM

## 2017-03-07 LAB — CBC WITH DIFFERENTIAL/PLATELET
Basophils Absolute: 0 10*3/uL (ref 0–0.1)
Basophils Relative: 0 %
EOS ABS: 0 10*3/uL (ref 0–0.7)
Eosinophils Relative: 1 %
HEMATOCRIT: 31.1 % — AB (ref 35.0–47.0)
HEMOGLOBIN: 10.5 g/dL — AB (ref 12.0–16.0)
LYMPHS PCT: 27 %
Lymphs Abs: 1 10*3/uL (ref 1.0–3.6)
MCH: 29.7 pg (ref 26.0–34.0)
MCHC: 33.9 g/dL (ref 32.0–36.0)
MCV: 87.6 fL (ref 80.0–100.0)
Monocytes Absolute: 0.3 10*3/uL (ref 0.2–0.9)
Monocytes Relative: 8 %
NEUTROS ABS: 2.5 10*3/uL (ref 1.4–6.5)
NEUTROS PCT: 64 %
Platelets: 132 10*3/uL — ABNORMAL LOW (ref 150–440)
RBC: 3.55 MIL/uL — AB (ref 3.80–5.20)
RDW: 14.6 % — ABNORMAL HIGH (ref 11.5–14.5)
WBC: 3.8 10*3/uL (ref 3.6–11.0)

## 2017-03-07 LAB — COMPREHENSIVE METABOLIC PANEL
ALK PHOS: 67 U/L (ref 38–126)
ALT: 15 U/L (ref 14–54)
AST: 18 U/L (ref 15–41)
Albumin: 3.4 g/dL — ABNORMAL LOW (ref 3.5–5.0)
Anion gap: 4 — ABNORMAL LOW (ref 5–15)
BUN: 14 mg/dL (ref 6–20)
CALCIUM: 9.1 mg/dL (ref 8.9–10.3)
CO2: 26 mmol/L (ref 22–32)
CREATININE: 0.75 mg/dL (ref 0.44–1.00)
Chloride: 108 mmol/L (ref 101–111)
GFR calc non Af Amer: >60 mL/min (ref >60)
GLUCOSE: 227 mg/dL — AB (ref 65–99)
Potassium: 4 mmol/L (ref 3.5–5.1)
SODIUM: 138 mmol/L (ref 135–145)
Total Bilirubin: 0.5 mg/dL (ref 0.3–1.2)
Total Protein: 6.6 g/dL (ref 6.5–8.1)

## 2017-03-07 MED ORDER — HEPARIN SOD (PORK) LOCK FLUSH 100 UNIT/ML IV SOLN
500.0000 [IU] | Freq: Once | INTRAVENOUS | Status: AC
  Administered 2017-03-07: 500 [IU] via INTRAVENOUS

## 2017-03-07 MED ORDER — SODIUM CHLORIDE 0.9% FLUSH
10.0000 mL | INTRAVENOUS | Status: DC | PRN
  Administered 2017-03-07: 10 mL via INTRAVENOUS
  Filled 2017-03-07: qty 10

## 2017-03-07 NOTE — Progress Notes (Signed)
States is feeling dizzy which started today.

## 2017-03-17 ENCOUNTER — Other Ambulatory Visit: Payer: Self-pay | Admitting: *Deleted

## 2017-03-17 DIAGNOSIS — C3412 Malignant neoplasm of upper lobe, left bronchus or lung: Secondary | ICD-10-CM

## 2017-03-17 NOTE — Telephone Encounter (Signed)
Too early for refill  

## 2017-04-18 ENCOUNTER — Ambulatory Visit
Admission: RE | Admit: 2017-04-18 | Discharge: 2017-04-18 | Disposition: A | Discharge status: Home / Self Care | Payer: PPO | Source: Ambulatory Visit | Attending: Oncology | Admitting: Oncology

## 2017-04-18 DIAGNOSIS — J439 Emphysema, unspecified: Secondary | ICD-10-CM | POA: Diagnosis not present

## 2017-04-18 DIAGNOSIS — C3412 Malignant neoplasm of upper lobe, left bronchus or lung: Secondary | ICD-10-CM

## 2017-04-18 DIAGNOSIS — C3490 Malignant neoplasm of unspecified part of unspecified bronchus or lung: Secondary | ICD-10-CM | POA: Diagnosis not present

## 2017-04-18 DIAGNOSIS — I700 Atherosclerosis of aorta: Secondary | ICD-10-CM | POA: Insufficient documentation

## 2017-04-18 MED ORDER — IOPAMIDOL (ISOVUE-300) INJECTION 61%
75.0000 mL | Freq: Once | INTRAVENOUS | Status: AC | PRN
  Administered 2017-04-18: 75 mL via INTRAVENOUS

## 2017-04-23 NOTE — Progress Notes (Signed)
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  INTERVAL HISTORY: Patient returns to clinic today for further evaluation and discussion of her imaging results. She complains of persistent headaches and worsening dizziness. She also has increased weakness and fatigue. She has no other neurologic complaints. She does not complain of peripheral neuropathy today. She denies any recent fevers or illnesses. She denies any chest pain, cough, hemoptysis, or shortness of breath. She denies constipation, diarrhea, nausea, or vomiting.  She has no urinary complaints. Patient offers no further specific complaints today.  REVIEW OF SYSTEMS:   Review of Systems  Constitutional: Positive for malaise/fatigue. Negative for fever and weight loss.  Respiratory: Negative for cough, hemoptysis and shortness of breath.   Cardiovascular: Negative.  Negative for chest pain and leg swelling.  Gastrointestinal: Negative.  Negative for abdominal pain.  Genitourinary: Positive for flank pain.  Musculoskeletal: Positive for joint pain.  Skin: Negative.  Negative for rash.  Neurological: Positive for dizziness, weakness and headaches. Negative for sensory change.  Psychiatric/Behavioral: Negative.  The patient is not nervous/anxious.     As per HPI. Otherwise, a complete review of systems is negative.  PAST MEDICAL HISTORY: Past Medical History:  Diagnosis Date  . Arthritis   . Blood dyscrasia   . Cancer of left lung ([place]) 08/2015   Rad + chemo tx's.  . Diabetes mellitus without  complication ([place])   . Diabetic neuropathy ([place])   . High cholesterol   . Hypertension     PAST SURGICAL HISTORY: Past Surgical History:  Procedure Laterality Date  . ABDOMINAL HYSTERECTOMY    . CESAREAN SECTION    . ELECTROMAGNETIC NAVIGATION BROCHOSCOPY N/A 11/26/2015   Procedure: ELECTROMAGNETIC NAVIGATION BRONCHOSCOPY;  Surgeon: Flora Lipps, MD;  Location: [facility];  Service: Cardiopulmonary;  Laterality: N/A;  . ENDOBRONCHIAL ULTRASOUND N/A 11/26/2015   Procedure: ENDOBRONCHIAL ULTRASOUND;  Surgeon: Flora Lipps, MD;  Location: [facility];  Service: Cardiopulmonary;  Laterality: N/A;  . PERIPHERAL VASCULAR CATHETERIZATION N/A 12/09/2015   Procedure: Glori Luis Cath Insertion;  Surgeon: Katha Cabal, MD;  Location: [place] [facility];  Service: Cardiovascular;  Laterality: N/A;    FAMILY HISTORY: Reviewed and unchanged. No reported history of malignancy or chronic disease.     ADVANCED DIRECTIVES:    HEALTH MAINTENANCE: Social History  Substance Use Topics  . Smoking status: Current Every Day Smoker    Packs/day: 0.30    Years: 35.00    Types: Cigarettes  . Smokeless tobacco: Never Used     Comment: workiing on quitting, down to 2 packs per week now  . Alcohol use No     No Known Allergies  Current Outpatient Prescriptions  Medication Sig Dispense Refill  . atorvastatin (LIPITOR) 20 MG tablet Take 20 mg by mouth daily at 6 PM.    . gabapentin (NEURONTIN) 300 MG capsule Take 1 capsule (300 mg total) by mouth at bedtime. (Patient not taking: Reported on 04/24/2017) 30 capsule 0  . insulin glargine (LANTUS) 100 UNIT/ML injection Inject 60 Units into the skin at bedtime.    . lidocaine-prilocaine (EMLA) cream Apply to affected area once (  Patient not taking: Reported on 04/24/2017) 30 g 6  . linagliptin (TRADJENTA) 5 MG TABS tablet Take 5 mg by mouth daily.     Marland Kitchen lisinopril (PRINIVIL,ZESTRIL) 10 MG tablet Take 10 mg by mouth daily.    . traMADol (ULTRAM) 50 MG tablet Take 1  tablet (50 mg total) by mouth 2 (two) times daily. (Patient not taking: Reported on 04/24/2017) 60 tablet 2   No current facility-administered medications for this visit.     OBJECTIVE: Vitals:   04/24/17 1013  BP: 126/74  Pulse: 76  Resp: 20  Temp: 98.1 F (36.7 C)     Body mass index is 39.46 kg/m.    ECOG FS:1 - Symptomatic but completely ambulatory  General: Well-developed, well-nourished, no acute distress. Eyes: Pink conjunctiva, anicteric sclera. Lungs: Clear to auscultation bilaterally. Heart: Regular rate and rhythm. No rubs, murmurs, or gallops. Abdomen: Soft, nontender, nondistended. No organomegaly noted, normoactive bowel sounds. Musculoskeletal: No edema, cyanosis, or clubbing. Neuro: Alert, answering all questions appropriately. Cranial nerves grossly intact. Skin: No rashes or petechiae noted. Psych: Normal affect.   LAB RESULTS:  Lab Results  Component Value Date   NA 138 03/07/2017   K 4.0 03/07/2017   CL 108 03/07/2017   CO2 26 03/07/2017   GLUCOSE 227 (H) 03/07/2017   BUN 14 03/07/2017   CREATININE 0.75 03/07/2017   CALCIUM 9.1 03/07/2017   PROT 6.6 03/07/2017   ALBUMIN 3.4 (L) 03/07/2017   AST 18 03/07/2017   ALT 15 03/07/2017   ALKPHOS 67 03/07/2017   BILITOT 0.5 03/07/2017   GFRNONAA >60 03/07/2017   GFRAA >60 03/07/2017    Lab Results  Component Value Date   WBC 3.8 03/07/2017   NEUTROABS 2.5 03/07/2017   HGB 10.5 (L) 03/07/2017   HCT 31.1 (L) 03/07/2017   MCV 87.6 03/07/2017   PLT 132 (L) 03/07/2017   Lab Results  Component Value Date   IRON 63 03/30/2016   TIBC 290 03/30/2016   IRONPCTSAT 22 03/30/2016    Lab Results  Component Value Date   FERRITIN 126 03/30/2016     STUDIES: Ct Chest W Contrast  Result Date: 04/18/2017 CLINICAL DATA:  Followup lung cancer. Status post radiation therapy. EXAM: CT CHEST WITH CONTRAST TECHNIQUE: Multidetector CT imaging of the chest was performed during intravenous contrast  administration. CONTRAST:  68m ISOVUE-300 IOPAMIDOL (ISOVUE-300) INJECTION 61% COMPARISON:  01/13/2017 and 01/03/2017. FINDINGS: Cardiovascular: Mild cardiac enlargement. Aortic atherosclerosis noted. No significant pericardial effusion identified. Mediastinum/Nodes: The trachea appears patent and is midline. Normal appearance of the esophagus. No axillary or supraclavicular adenopathy. Index right paratracheal node measures 1.2 cm, image 54 of series 2. Previously 1.1 cm. Prevascular nodal mass measures 4 x 2.5 cm, image 49 of series 2. Lungs/Pleura: No pleural effusion. Mild changes of centrilobular emphysema. Diffuse underlying peribronchovascular thickening throughout both lungs again noted. Index lesion within the left upper lobe measures 2.9 cm, image 41 of series 3. Previously 1.4 cm peer a adjacent lesion measures 3.2 cm, image 41 of series 3. Previous 1.8 cm. Paramediastinal fibrotic change within the left lung is again noted compatible with external beam radiation. Previously noted 5 mm right lower lobe lung nodule has decreased in size in the interval. Now measuring 4 mm. Upper Abdomen: No acute abnormality. Musculoskeletal: There is degenerative disc disease identified within the thoracic spine. No aggressive lytic or sclerotic bone lesions. IMPRESSION: 1. Interval progression of disease. There are 2 lesions within the left upper lobe which demonstrates significant increase  in size in the interval. The index left-sided pre-vascular lymph node has also increased in size in the interval. 2. Similar appearance of changes secondary to external beam radiation within the left lung. 3. Similar appearance of diffuse peribronchovascular thickening in both lungs. 4. Aortic Atherosclerosis (ICD10-I70.0) and Emphysema (ICD10-J43.9). Electronically Signed   By: Kerby Moors M.D.   On: 04/18/2017 09:43   Mr Jeri Cos MA Contrast  Result Date: 04/27/2017 CLINICAL DATA:  Dizziness and balance issues. Problems for 1  year, now constant. Lung cancer. EXAM: MRI HEAD WITHOUT AND WITH CONTRAST TECHNIQUE: Multiplanar, multiecho pulse sequences of the brain and surrounding structures were obtained without and with intravenous contrast. CONTRAST:  44m MULTIHANCE GADOBENATE DIMEGLUMINE 529 MG/ML IV SOLN COMPARISON:  01/05/2016.  PET scan 01/13/2017. FINDINGS: Brain: No evidence for acute stroke, acute hemorrhage, mass lesion, hydrocephalus, or extra-axial fluid. Normal for age cerebral volume. Mild subcortical and periventricular T2 and FLAIR hyperintensities, likely chronic microvascular ischemic change. Vascular: Normal flow voids. Skull and upper cervical spine: Normal marrow signal. On sagittal T1 weighted images, there is evidence for significant impaction of the cerebellar tonsils into the upper cervical canal, downward displacement of 16 mm below the foramen magnum, with abnormal peg like shape, consistent Chiari I malformation. There is compression, but not abnormal signal, at the cervicomedullary junction. Empty sella is noted, with flattening of the gland, slight expansion of the sella. Sinuses/Orbits: No orbital masses or proptosis. Globes appear symmetric. Sinuses appear well aerated, without evidence for air-fluid level. Other: No nasopharyngeal pathology or mastoid fluid. Scalp and other visualized extracranial soft tissues grossly unremarkable. IMPRESSION: Chiari I malformation, with slight progression since 2017. No hydrocephalus, but there is increased downward displacement of cerebellar tonsils. Neurosurgical consultation is warranted, as the degree of tonsillar herniation appears to be causing significant mass effect on the cervicomedullary junction. Electronically Signed   By: JStaci RighterM.D.   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 She then underwent 2 cycles of consolidation carboplatinum and Taxol completing on Apr 27, 2016. PET scan results from January 13, 2017 reviewed independently highly suspicious for recurrent disease locally in her mediastinum. Patient missed several appointments since that time, therefore was elected to repeat imaging with CT scan to assess for interval change. CT scan results from Apr 18, 2017 reviewed independently and reported as above with progression of disease. A referral has been made to radiation oncology to assess whether additional XRT is possible if patient pursues XRT, will include concurrent weekly carboplatinum and Taxol. If not an option, patient will return to clinic in 2 weeks to initiate cycle 1 of nivolumab.  2. Headaches/dizziness: MRI of the brain reviewed independently and reported as above with no obvious metastatic disease, but neurosurgical referral was recommended. 3. Anemia: Improving. Previously iron stores were within normal limits, monitor.  4. Thrombocytopenia: Mild, monitor. 5. Hyperglycemia: Continue diabetic medications as prescribed.   Approximately 30 minutes was spent in discussion of which greater than 50% was consultation.   Patient expressed understanding and was in agreement with this plan. She also understands that She can call clinic at any time with any questions, concerns, or complaints.    TLloyd Huger MD   04/29/2017 7:28 AM

## 2017-04-24 ENCOUNTER — Inpatient Hospital Stay: Payer: PPO | Attending: Oncology | Admitting: Oncology

## 2017-04-24 VITALS — BP 126/74 | HR 76 | Temp 98.1°F | Resp 20 | Wt 244.5 lb

## 2017-04-24 DIAGNOSIS — I10 Essential (primary) hypertension: Secondary | ICD-10-CM | POA: Diagnosis not present

## 2017-04-24 DIAGNOSIS — Z79899 Other long term (current) drug therapy: Secondary | ICD-10-CM | POA: Diagnosis not present

## 2017-04-24 DIAGNOSIS — F17210 Nicotine dependence, cigarettes, uncomplicated: Secondary | ICD-10-CM | POA: Diagnosis not present

## 2017-04-24 DIAGNOSIS — E7800 Pure hypercholesterolemia, unspecified: Secondary | ICD-10-CM | POA: Diagnosis not present

## 2017-04-24 DIAGNOSIS — Z923 Personal history of irradiation: Secondary | ICD-10-CM | POA: Insufficient documentation

## 2017-04-24 DIAGNOSIS — C3412 Malignant neoplasm of upper lobe, left bronchus or lung: Secondary | ICD-10-CM | POA: Diagnosis not present

## 2017-04-24 DIAGNOSIS — Z794 Long term (current) use of insulin: Secondary | ICD-10-CM | POA: Insufficient documentation

## 2017-04-24 DIAGNOSIS — E1165 Type 2 diabetes mellitus with hyperglycemia: Secondary | ICD-10-CM | POA: Insufficient documentation

## 2017-04-24 DIAGNOSIS — D649 Anemia, unspecified: Secondary | ICD-10-CM

## 2017-04-24 DIAGNOSIS — R51 Headache: Secondary | ICD-10-CM

## 2017-04-24 DIAGNOSIS — Z9221 Personal history of antineoplastic chemotherapy: Secondary | ICD-10-CM | POA: Insufficient documentation

## 2017-04-24 DIAGNOSIS — D696 Thrombocytopenia, unspecified: Secondary | ICD-10-CM | POA: Diagnosis not present

## 2017-04-24 NOTE — Progress Notes (Signed)
Patient here today for follow up regarding lung cancer, CT results. Patient reports headaches, dizziness, progressive weakness and loss of balance.

## 2017-04-27 ENCOUNTER — Ambulatory Visit
Admission: RE | Admit: 2017-04-27 | Discharge: 2017-04-27 | Disposition: A | Discharge status: Home / Self Care | Payer: PPO | Source: Ambulatory Visit | Attending: Oncology | Admitting: Oncology

## 2017-04-27 DIAGNOSIS — G935 Compression of brain: Secondary | ICD-10-CM | POA: Insufficient documentation

## 2017-04-27 DIAGNOSIS — C3412 Malignant neoplasm of upper lobe, left bronchus or lung: Secondary | ICD-10-CM | POA: Insufficient documentation

## 2017-04-27 DIAGNOSIS — R42 Dizziness and giddiness: Secondary | ICD-10-CM | POA: Diagnosis not present

## 2017-04-27 MED ORDER — GADOBENATE DIMEGLUMINE 529 MG/ML IV SOLN
20.0000 mL | Freq: Once | INTRAVENOUS | Status: AC | PRN
  Administered 2017-04-27: 20 mL via INTRAVENOUS

## 2017-04-29 MED ORDER — LIDOCAINE-PRILOCAINE 2.5-2.5 % EX CREA: TOPICAL_CREAM | CUTANEOUS | 3 refills | Status: DC

## 2017-04-29 NOTE — Progress Notes (Signed)
START OFF PATHWAY REGIMEN - Non-Small Cell Lung   [id number]:Nivolumab 240 mg q14 Days:   A cycle is every 14 days:     Nivolumab   **Always confirm dose/schedule in your pharmacy ordering system**    Patient Characteristics: Local Recurrence AJCC T Category: TX Current Disease Status: Local Recurrence AJCC N Category: NX AJCC M Category: M0 AJCC 8 Stage Grouping: IIIA  Intent of Therapy: Non-Curative / Palliative Intent, Discussed with Patient

## 2017-05-02 ENCOUNTER — Other Ambulatory Visit: Payer: Self-pay

## 2017-05-02 DIAGNOSIS — R51 Headache: Secondary | ICD-10-CM

## 2017-05-02 DIAGNOSIS — C3412 Malignant neoplasm of upper lobe, left bronchus or lung: Secondary | ICD-10-CM

## 2017-05-02 DIAGNOSIS — R519 Headache, unspecified: Secondary | ICD-10-CM

## 2017-05-04 ENCOUNTER — Ambulatory Visit
Admission: RE | Admit: 2017-05-04 | Discharge: 2017-05-04 | Disposition: A | Discharge status: Home / Self Care | Payer: PPO | Source: Ambulatory Visit | Attending: Radiation Oncology | Admitting: Radiation Oncology

## 2017-05-04 ENCOUNTER — Encounter: Payer: Self-pay | Admitting: Radiation Oncology

## 2017-05-04 VITALS — BP 124/74 | HR 80 | Temp 97.9°F | Wt 247.1 lb

## 2017-05-04 DIAGNOSIS — C7802 Secondary malignant neoplasm of left lung: Secondary | ICD-10-CM | POA: Insufficient documentation

## 2017-05-04 DIAGNOSIS — C3412 Malignant neoplasm of upper lobe, left bronchus or lung: Secondary | ICD-10-CM | POA: Diagnosis not present

## 2017-05-04 DIAGNOSIS — F17210 Nicotine dependence, cigarettes, uncomplicated: Secondary | ICD-10-CM | POA: Diagnosis not present

## 2017-05-04 DIAGNOSIS — R51 Headache: Secondary | ICD-10-CM | POA: Insufficient documentation

## 2017-05-04 DIAGNOSIS — Z923 Personal history of irradiation: Secondary | ICD-10-CM | POA: Diagnosis not present

## 2017-05-04 DIAGNOSIS — Z510 Encounter for antineoplastic radiation therapy: Secondary | ICD-10-CM | POA: Diagnosis not present

## 2017-05-04 NOTE — Progress Notes (Signed)
Radiation Oncology Follow up Note  Name: Susan Willis   Date:   05/04/2017 MRN:  [id number] DOB: [birth year]    This 66 y.o. 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  REFERRING PROVIDER: Herminio Commons, MD  HPI: patient is a 66 year old female now out 1 year having completed radiation therapy with concurrent chemotherapy for a T4 N2 M0 non-small cell lung carcinoma the left lung.she has been having headaches and recent MRI scan of her brain showed no evidence of intracranial lesions. Back in February 2018 PET CT scan showed hypermetabolic activity in left hilar region consistent with persistent or recurrent disease.recent CT scan showed again interval progression of disease with 2 lesions in the left upper lobe and left-sided prevascular lymph node also increased in size in the interval. She does have a mild cough no hemoptysis. I been asked to evaluate her for possible salvage radiation therapy with concurrent chemotherapy. She is having no dysphagia.  COMPLICATIONS OF TREATMENT: none  FOLLOW UP COMPLIANCE: keeps appointments   PHYSICAL EXAM:  BP 124/74   Pulse 80   Temp 97.9 F (36.6 C)   Wt 247 lb 2.2 oz (112.1 kg)   BMI 39.89 kg/m  Well-developed obese female in NAD. Well-developed well-nourished patient in NAD. HEENT reveals PERLA, EOMI, discs not visualized.  Oral cavity is clear. No oral mucosal lesions are identified. Neck is clear without evidence of cervical or supraclavicular adenopathy. Lungs are clear to A&P. Cardiac examination is essentially unremarkable with regular rate and rhythm without murmur rub or thrill. Abdomen is benign with no organomegaly or masses noted. Motor sensory and DTR levels are equal and symmetric in the upper and lower extremities. Cranial nerves II through XII are grossly intact. Proprioception is  intact. No peripheral adenopathy or edema is identified. No motor or sensory levels are noted. Crude visual fields are within normal range.  RADIOLOGY RESULTS: CT scans and PET/CT scan reviewed and compatible with the above-stated findings  PLAN: at the present time I like to go ahead with salvage radiation therapy to her left hilum trying to cooperate the 2 lesions an area of hypermetabolic activity in the left hilum with I M RT radiation therapy. Would plan on delivering 4000 cGy in 10 fractions with concurrent chemotherapy. Risks and benefits of treatment including retreating previously irradiated lung tissue possible cough fatigue alteration of blood counts and possible radiation esophagitis all were discussed in detail with the patient.I believe we can contour treatment fields to limit the amount ofnormal lung involvedand spare as much previously treated area as possible. I will arrange for concurrent chemotherapy along with our radiation therapy. I personally ordered and scheduled CT simulation for next week.There will be extra effort by both professional staff as well as technical staff to coordinate and manage concurrent chemoradiation and ensuing side effects during her treatments.atient has my treatment plan well.  I would like to take this opportunity to thank you for allowing me to participate in the care of your patient.Armstead Peaks., MD

## 2017-05-08 ENCOUNTER — Inpatient Hospital Stay (HOSPITAL_BASED_OUTPATIENT_CLINIC_OR_DEPARTMENT_OTHER): Payer: PPO | Admitting: Oncology

## 2017-05-08 ENCOUNTER — Ambulatory Visit: Payer: PPO | Admitting: Radiation Oncology

## 2017-05-08 ENCOUNTER — Ambulatory Visit: Payer: PPO

## 2017-05-08 ENCOUNTER — Inpatient Hospital Stay: Payer: PPO | Attending: Oncology

## 2017-05-08 ENCOUNTER — Ambulatory Visit
Admission: RE | Admit: 2017-05-08 | Discharge: 2017-05-08 | Disposition: A | Discharge status: Home / Self Care | Payer: PPO | Source: Ambulatory Visit | Attending: Radiation Oncology | Admitting: Radiation Oncology

## 2017-05-08 ENCOUNTER — Inpatient Hospital Stay: Payer: PPO

## 2017-05-08 VITALS — BP 128/78 | HR 79 | Temp 96.3°F | Wt 248.4 lb

## 2017-05-08 DIAGNOSIS — D696 Thrombocytopenia, unspecified: Secondary | ICD-10-CM | POA: Diagnosis not present

## 2017-05-08 DIAGNOSIS — Z79899 Other long term (current) drug therapy: Secondary | ICD-10-CM

## 2017-05-08 DIAGNOSIS — E1165 Type 2 diabetes mellitus with hyperglycemia: Secondary | ICD-10-CM | POA: Diagnosis not present

## 2017-05-08 DIAGNOSIS — D649 Anemia, unspecified: Secondary | ICD-10-CM | POA: Insufficient documentation

## 2017-05-08 DIAGNOSIS — Z923 Personal history of irradiation: Secondary | ICD-10-CM

## 2017-05-08 DIAGNOSIS — I10 Essential (primary) hypertension: Secondary | ICD-10-CM | POA: Insufficient documentation

## 2017-05-08 DIAGNOSIS — Z510 Encounter for antineoplastic radiation therapy: Secondary | ICD-10-CM | POA: Diagnosis not present

## 2017-05-08 DIAGNOSIS — E7800 Pure hypercholesterolemia, unspecified: Secondary | ICD-10-CM | POA: Insufficient documentation

## 2017-05-08 DIAGNOSIS — C3412 Malignant neoplasm of upper lobe, left bronchus or lung: Secondary | ICD-10-CM | POA: Insufficient documentation

## 2017-05-08 DIAGNOSIS — F17210 Nicotine dependence, cigarettes, uncomplicated: Secondary | ICD-10-CM

## 2017-05-08 DIAGNOSIS — Z794 Long term (current) use of insulin: Secondary | ICD-10-CM | POA: Diagnosis not present

## 2017-05-08 DIAGNOSIS — Z5111 Encounter for antineoplastic chemotherapy: Secondary | ICD-10-CM | POA: Insufficient documentation

## 2017-05-08 LAB — CBC WITH DIFFERENTIAL/PLATELET
Basophils Absolute: 0 10*3/uL (ref 0–0.1)
Basophils Relative: 0 %
EOS ABS: 0.1 10*3/uL (ref 0–0.7)
Eosinophils Relative: 2 %
HCT: 29.3 % — ABNORMAL LOW (ref 35.0–47.0)
HEMOGLOBIN: 10 g/dL — AB (ref 12.0–16.0)
LYMPHS ABS: 1 10*3/uL (ref 1.0–3.6)
LYMPHS PCT: 27 %
MCH: 30 pg (ref 26.0–34.0)
MCHC: 34.1 g/dL (ref 32.0–36.0)
MCV: 87.9 fL (ref 80.0–100.0)
Monocytes Absolute: 0.3 10*3/uL (ref 0.2–0.9)
Monocytes Relative: 9 %
NEUTROS ABS: 2.3 10*3/uL (ref 1.4–6.5)
Neutrophils Relative %: 62 %
Platelets: 124 10*3/uL — ABNORMAL LOW (ref 150–440)
RBC: 3.34 MIL/uL — AB (ref 3.80–5.20)
RDW: 14.5 % (ref 11.5–14.5)
WBC: 3.6 10*3/uL (ref 3.6–11.0)

## 2017-05-08 LAB — COMPREHENSIVE METABOLIC PANEL
ALK PHOS: 98 U/L (ref 38–126)
ALT: 23 U/L (ref 14–54)
AST: 30 U/L (ref 15–41)
Albumin: 3.4 g/dL — ABNORMAL LOW (ref 3.5–5.0)
Anion gap: 3 — ABNORMAL LOW (ref 5–15)
BUN: 16 mg/dL (ref 6–20)
CO2: 26 mmol/L (ref 22–32)
Calcium: 9.1 mg/dL (ref 8.9–10.3)
Chloride: 111 mmol/L (ref 101–111)
Creatinine, Ser: 0.79 mg/dL (ref 0.44–1.00)
GFR calc non Af Amer: >60 mL/min (ref >60)
Glucose, Bld: 229 mg/dL — ABNORMAL HIGH (ref 65–99)
Potassium: 4.1 mmol/L (ref 3.5–5.1)
SODIUM: 140 mmol/L (ref 135–145)
Total Bilirubin: 0.5 mg/dL (ref 0.3–1.2)
Total Protein: 6.5 g/dL (ref 6.5–8.1)

## 2017-05-08 MED ORDER — SODIUM CHLORIDE 0.9 % IV SOLN
Freq: Once | INTRAVENOUS | Status: AC
  Administered 2017-05-08: 10:00:00 via INTRAVENOUS
  Filled 2017-05-08: qty 1000

## 2017-05-08 MED ORDER — SODIUM CHLORIDE 0.9% FLUSH
10.0000 mL | INTRAVENOUS | Status: DC | PRN
  Administered 2017-05-08: 10 mL via INTRAVENOUS
  Filled 2017-05-08: qty 10

## 2017-05-08 MED ORDER — SODIUM CHLORIDE 0.9 % IV SOLN
240.0000 mg | Freq: Once | INTRAVENOUS | Status: AC
  Administered 2017-05-08: 240 mg via INTRAVENOUS
  Filled 2017-05-08: qty 24

## 2017-05-08 MED ORDER — HEPARIN SOD (PORK) LOCK FLUSH 100 UNIT/ML IV SOLN
500.0000 [IU] | Freq: Once | INTRAVENOUS | Status: AC
  Administered 2017-05-08: 500 [IU] via INTRAVENOUS

## 2017-05-08 NOTE — Progress Notes (Signed)
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  INTERVAL HISTORY: Patient returns to clinic today for further evaluation and initiation of nivolumab.  She continues to have headaches and dizziness. She also has increased weakness and fatigue. She has no other neurologic complaints. She does not complain of peripheral neuropathy today. She denies any recent fevers or illnesses. She denies any chest pain, cough, hemoptysis, or shortness of breath. She denies constipation, diarrhea, nausea, or vomiting.  She has no urinary complaints. Patient offers no further specific complaints today.  REVIEW OF SYSTEMS:   Review of Systems  Constitutional: Positive for malaise/fatigue. Negative for fever and weight loss.  Respiratory: Negative for cough, hemoptysis and shortness of breath.   Cardiovascular: Negative.  Negative for chest pain and leg swelling.  Gastrointestinal: Negative.  Negative for abdominal pain.  Genitourinary: Positive for flank pain.  Musculoskeletal: Positive for joint pain.  Skin: Negative.  Negative for rash.  Neurological: Positive for dizziness, weakness and headaches. Negative for sensory change.  Psychiatric/Behavioral: Negative.  The patient is not nervous/anxious.     As per HPI. Otherwise, a complete review of systems is negative.  PAST MEDICAL HISTORY: Past Medical History:  Diagnosis Date  . Arthritis   . Blood dyscrasia   . Cancer of left lung ([place]) 08/2015   Rad + chemo tx's.  . Diabetes mellitus without complication ([place])   . Diabetic  neuropathy ([place])   . High cholesterol   . Hypertension     PAST SURGICAL HISTORY: Past Surgical History:  Procedure Laterality Date  . ABDOMINAL HYSTERECTOMY    . CESAREAN SECTION    . ELECTROMAGNETIC NAVIGATION BROCHOSCOPY N/A 11/26/2015   Procedure: ELECTROMAGNETIC NAVIGATION BRONCHOSCOPY;  Surgeon: Flora Lipps, MD;  Location: [facility];  Service: Cardiopulmonary;  Laterality: N/A;  . ENDOBRONCHIAL ULTRASOUND N/A 11/26/2015   Procedure: ENDOBRONCHIAL ULTRASOUND;  Surgeon: Flora Lipps, MD;  Location: [facility];  Service: Cardiopulmonary;  Laterality: N/A;  . PERIPHERAL VASCULAR CATHETERIZATION N/A 12/09/2015   Procedure: Glori Luis Cath Insertion;  Surgeon: Katha Cabal, MD;  Location: [facility];  Service: Cardiovascular;  Laterality: N/A;    FAMILY HISTORY: Reviewed and unchanged. No reported history of malignancy or chronic disease.     ADVANCED DIRECTIVES:    HEALTH MAINTENANCE: Social History  Substance Use Topics  . Smoking status: Current Every Day Smoker    Packs/day: 0.30    Years: 35.00    Types: Cigarettes  . Smokeless tobacco: Never Used     Comment: workiing on quitting, down to 2 packs per week now  . Alcohol use No     No Known Allergies  Current Outpatient Prescriptions  Medication Sig Dispense Refill  . lidocaine-prilocaine (EMLA) cream Apply to affected area once 30 g 6  . traMADol (ULTRAM) 50 MG tablet Take 1 tablet (50 mg total) by mouth 2 (two) times daily. 60 tablet 2  . atorvastatin (LIPITOR) 20 MG tablet Take 20 mg by mouth daily at 6 PM.    . gabapentin (NEURONTIN) 300 MG capsule Take 1 capsule (300 mg total) by mouth at bedtime. (Patient not taking: Reported  on 04/24/2017) 30 capsule 0  . insulin glargine (LANTUS) 100 UNIT/ML injection Inject 60 Units into the skin at bedtime.    . lidocaine-prilocaine (EMLA) cream Apply to affected area once (Patient not taking: Reported on 05/08/2017) 30 g 3  . linagliptin (TRADJENTA) 5 MG TABS tablet  Take 5 mg by mouth daily.     Marland Kitchen lisinopril (PRINIVIL,ZESTRIL) 10 MG tablet Take 10 mg by mouth daily.     No current facility-administered medications for this visit.     OBJECTIVE: Vitals:   05/08/17 0937  BP: 128/78  Pulse: 79  Temp: (!) 96.3 F (35.7 C)     Body mass index is 40.09 kg/m.    ECOG FS:1 - Symptomatic but completely ambulatory  General: Well-developed, well-nourished, no acute distress. Eyes: Pink conjunctiva, anicteric sclera. Lungs: Clear to auscultation bilaterally. Heart: Regular rate and rhythm. No rubs, murmurs, or gallops. Abdomen: Soft, nontender, nondistended. No organomegaly noted, normoactive bowel sounds. Musculoskeletal: No edema, cyanosis, or clubbing. Neuro: Alert, answering all questions appropriately. Cranial nerves grossly intact. Skin: No rashes or petechiae noted. Psych: Normal affect.   LAB RESULTS:  Lab Results  Component Value Date   NA 140 05/08/2017   K 4.1 05/08/2017   CL 111 05/08/2017   CO2 26 05/08/2017   GLUCOSE 229 (H) 05/08/2017   BUN 16 05/08/2017   CREATININE 0.79 05/08/2017   CALCIUM 9.1 05/08/2017   PROT 6.5 05/08/2017   ALBUMIN 3.4 (L) 05/08/2017   AST 30 05/08/2017   ALT 23 05/08/2017   ALKPHOS 98 05/08/2017   BILITOT 0.5 05/08/2017   GFRNONAA >60 05/08/2017   GFRAA >60 05/08/2017    Lab Results  Component Value Date   WBC 3.6 05/08/2017   NEUTROABS 2.3 05/08/2017   HGB 10.0 (L) 05/08/2017   HCT 29.3 (L) 05/08/2017   MCV 87.9 05/08/2017   PLT 124 (L) 05/08/2017   Lab Results  Component Value Date   IRON 63 03/30/2016   TIBC 290 03/30/2016   IRONPCTSAT 22 03/30/2016    Lab Results  Component Value Date   FERRITIN 126 03/30/2016     STUDIES: Ct Chest W Contrast  Result Date: 04/18/2017 CLINICAL DATA:  Followup lung cancer. Status post radiation therapy. EXAM: CT CHEST WITH CONTRAST TECHNIQUE: Multidetector CT imaging of the chest was performed during intravenous contrast administration.  CONTRAST:  43mL ISOVUE-300 IOPAMIDOL (ISOVUE-300) INJECTION 61% COMPARISON:  01/13/2017 and 01/03/2017. FINDINGS: Cardiovascular: Mild cardiac enlargement. Aortic atherosclerosis noted. No significant pericardial effusion identified. Mediastinum/Nodes: The trachea appears patent and is midline. Normal appearance of the esophagus. No axillary or supraclavicular adenopathy. Index right paratracheal node measures 1.2 cm, image 54 of series 2. Previously 1.1 cm. Prevascular nodal mass measures 4 x 2.5 cm, image 49 of series 2. Lungs/Pleura: No pleural effusion. Mild changes of centrilobular emphysema. Diffuse underlying peribronchovascular thickening throughout both lungs again noted. Index lesion within the left upper lobe measures 2.9 cm, image 41 of series 3. Previously 1.4 cm peer a adjacent lesion measures 3.2 cm, image 41 of series 3. Previous 1.8 cm. Paramediastinal fibrotic change within the left lung is again noted compatible with external beam radiation. Previously noted 5 mm right lower lobe lung nodule has decreased in size in the interval. Now measuring 4 mm. Upper Abdomen: No acute abnormality. Musculoskeletal: There is degenerative disc disease identified within the thoracic spine. No aggressive lytic or sclerotic bone lesions. IMPRESSION: 1. Interval progression of disease. There are 2 lesions within the left upper lobe which  demonstrates significant increase in size in the interval. The index left-sided pre-vascular lymph node has also increased in size in the interval. 2. Similar appearance of changes secondary to external beam radiation within the left lung. 3. Similar appearance of diffuse peribronchovascular thickening in both lungs. 4. Aortic Atherosclerosis (ICD10-I70.0) and Emphysema (ICD10-J43.9). Electronically Signed   By: Kerby Moors M.D.   On: 04/18/2017 09:43   Mr Jeri Cos JO Contrast  Result Date: 04/27/2017 CLINICAL DATA:  Dizziness and balance issues. Problems for 1 year, now  constant. Lung cancer. EXAM: MRI HEAD WITHOUT AND WITH CONTRAST TECHNIQUE: Multiplanar, multiecho pulse sequences of the brain and surrounding structures were obtained without and with intravenous contrast. CONTRAST:  45mL MULTIHANCE GADOBENATE DIMEGLUMINE 529 MG/ML IV SOLN COMPARISON:  01/05/2016.  PET scan 01/13/2017. FINDINGS: Brain: No evidence for acute stroke, acute hemorrhage, mass lesion, hydrocephalus, or extra-axial fluid. Normal for age cerebral volume. Mild subcortical and periventricular T2 and FLAIR hyperintensities, likely chronic microvascular ischemic change. Vascular: Normal flow voids. Skull and upper cervical spine: Normal marrow signal. On sagittal T1 weighted images, there is evidence for significant impaction of the cerebellar tonsils into the upper cervical canal, downward displacement of 16 mm below the foramen magnum, with abnormal peg like shape, consistent Chiari I malformation. There is compression, but not abnormal signal, at the cervicomedullary junction. Empty sella is noted, with flattening of the gland, slight expansion of the sella. Sinuses/Orbits: No orbital masses or proptosis. Globes appear symmetric. Sinuses appear well aerated, without evidence for air-fluid level. Other: No nasopharyngeal pathology or mastoid fluid. Scalp and other visualized extracranial soft tissues grossly unremarkable. IMPRESSION: Chiari I malformation, with slight progression since 2017. No hydrocephalus, but there is increased downward displacement of cerebellar tonsils. Neurosurgical consultation is warranted, as the degree of tonsillar herniation appears to be causing significant mass effect on the cervicomedullary junction. Electronically Signed   By: Staci Righter M.D.   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 She then underwent 2 cycles of consolidation carboplatinum and Taxol completing on Apr 27, 2016.  CT scan results from Apr 18, 2017 reviewed independently and reported as above with progression of disease. A referral has been made to radiation oncology to assess whether additional XRT is possible. Proceed with cycle 1 of nivolumab today. Return to clinic in 2 weeks for consideration of cycle 2. 2. Headaches/dizziness: MRI of the brain reviewed independently and reported as above with no obvious metastatic disease. Neurosurgical referral was placed. 3. Anemia: Improving. Previously iron stores were within normal limits, monitor.  4. Thrombocytopenia: Mild, monitor. 5. Hyperglycemia: Continue diabetic medications as prescribed.   Approximately 30 minutes was spent in discussion of which greater than 50% was consultation.   Patient expressed understanding and was in agreement with this plan. She also understands that She can call clinic at any time with any questions, concerns, or complaints.    [name], MD   05/12/2017 4:28 PM

## 2017-05-08 NOTE — Progress Notes (Signed)
Patient here today for follow up.  Patient states no new concerns today  

## 2017-05-09 LAB — THYROID PANEL WITH TSH
Free Thyroxine Index: 2 (ref 1.2–4.9)
T3 UPTAKE RATIO: 28 % (ref 24–39)
T4, Total: 7.1 ug/dL (ref 4.5–12.0)
TSH: 1.26 u[IU]/mL (ref 0.450–4.500)

## 2017-05-11 DIAGNOSIS — F17210 Nicotine dependence, cigarettes, uncomplicated: Secondary | ICD-10-CM | POA: Diagnosis not present

## 2017-05-11 DIAGNOSIS — Z510 Encounter for antineoplastic radiation therapy: Secondary | ICD-10-CM | POA: Diagnosis not present

## 2017-05-11 DIAGNOSIS — C3412 Malignant neoplasm of upper lobe, left bronchus or lung: Secondary | ICD-10-CM | POA: Diagnosis not present

## 2017-05-12 MED ORDER — TRAMADOL HCL 50 MG PO TABS: 50.0000 mg | ORAL_TABLET | Freq: Two times a day (BID) | ORAL | 2 refills | Status: DC

## 2017-05-17 ENCOUNTER — Ambulatory Visit
Admission: RE | Admit: 2017-05-17 | Discharge: 2017-05-17 | Disposition: A | Discharge status: Home / Self Care | Payer: PPO | Source: Ambulatory Visit | Attending: Radiation Oncology | Admitting: Radiation Oncology

## 2017-05-17 ENCOUNTER — Other Ambulatory Visit: Payer: Self-pay | Admitting: *Deleted

## 2017-05-17 DIAGNOSIS — C3412 Malignant neoplasm of upper lobe, left bronchus or lung: Secondary | ICD-10-CM

## 2017-05-17 MED ORDER — TRAMADOL HCL 50 MG PO TABS: 50.0000 mg | ORAL_TABLET | Freq: Two times a day (BID) | ORAL | 2 refills | Status: DC

## 2017-05-17 NOTE — Addendum Note (Signed)
Addended by: Betti Cruz on: 05/17/2017 10:33 AM   Modules accepted: Orders

## 2017-05-17 NOTE — Telephone Encounter (Signed)
This was just prescribed on 6/8 for # 60 tabs to be taken BID

## 2017-05-18 ENCOUNTER — Ambulatory Visit
Admission: RE | Admit: 2017-05-18 | Discharge: 2017-05-18 | Disposition: A | Discharge status: Home / Self Care | Payer: PPO | Source: Ambulatory Visit | Attending: Radiation Oncology | Admitting: Radiation Oncology

## 2017-05-18 DIAGNOSIS — Z510 Encounter for antineoplastic radiation therapy: Secondary | ICD-10-CM | POA: Diagnosis not present

## 2017-05-18 DIAGNOSIS — C3412 Malignant neoplasm of upper lobe, left bronchus or lung: Secondary | ICD-10-CM | POA: Diagnosis not present

## 2017-05-18 DIAGNOSIS — F17210 Nicotine dependence, cigarettes, uncomplicated: Secondary | ICD-10-CM | POA: Diagnosis not present

## 2017-05-19 ENCOUNTER — Ambulatory Visit: Payer: PPO

## 2017-05-20 NOTE — Progress Notes (Signed)
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  INTERVAL HISTORY: Patient returns to clinic today for further evaluation and consideration of cycle 2 of nivolumab.  She continues to have headaches and dizziness, but has not yet seen neurosurgery. She does not complain of weakness or fatigue today. She has no other neurologic complaints. She does not complain of peripheral neuropathy today. She denies any recent fevers or illnesses. She denies any chest pain, cough, hemoptysis, or shortness of breath. She denies constipation, diarrhea, nausea, or vomiting.  She has some hesitancy with urination. Patient offers no further specific complaints today.  REVIEW OF SYSTEMS:   Review of Systems  Constitutional: Negative for fever, malaise/fatigue and weight loss.  Respiratory: Negative for cough, hemoptysis and shortness of breath.   Cardiovascular: Negative.  Negative for chest pain and leg swelling.  Gastrointestinal: Negative.  Negative for abdominal pain.  Genitourinary: Negative for flank pain.  Musculoskeletal: Positive for joint pain.  Skin: Negative.  Negative for rash.  Neurological: Positive for dizziness and headaches. Negative for sensory change and weakness.  Psychiatric/Behavioral: Negative.  The patient is not nervous/anxious.     As per HPI. Otherwise, a complete review of systems is negative.  PAST MEDICAL HISTORY: Past Medical History:  Diagnosis Date  . Arthritis   . Blood dyscrasia   . Cancer of left lung ([place]) 08/2015   Rad + chemo tx's.  .  Diabetes mellitus without complication ([place])   . Diabetic neuropathy ([place])   . High cholesterol   . Hypertension     PAST SURGICAL HISTORY: Past Surgical History:  Procedure Laterality Date  . ABDOMINAL HYSTERECTOMY    . CESAREAN SECTION    . ELECTROMAGNETIC NAVIGATION BROCHOSCOPY N/A 11/26/2015   Procedure: ELECTROMAGNETIC NAVIGATION BRONCHOSCOPY;  Surgeon: Flora Lipps, MD;  Location: [facility];  Service: Cardiopulmonary;  Laterality: N/A;  . ENDOBRONCHIAL ULTRASOUND N/A 11/26/2015   Procedure: ENDOBRONCHIAL ULTRASOUND;  Surgeon: Flora Lipps, MD;  Location: [facility];  Service: Cardiopulmonary;  Laterality: N/A;  . PERIPHERAL VASCULAR CATHETERIZATION N/A 12/09/2015   Procedure: Glori Luis Cath Insertion;  Surgeon: Katha Cabal, MD;  Location: [place] [facility];  Service: Cardiovascular;  Laterality: N/A;    FAMILY HISTORY: Reviewed and unchanged. No reported history of malignancy or chronic disease.     ADVANCED DIRECTIVES:    HEALTH MAINTENANCE: Social History  Substance Use Topics  . Smoking status: Current Every Day Smoker    Packs/day: 0.30    Years: 35.00    Types: Cigarettes  . Smokeless tobacco: Never Used     Comment: workiing on quitting, down to 2 packs per week now  . Alcohol use No     No Known Allergies  Current Outpatient Prescriptions  Medication Sig Dispense Refill  . atorvastatin (LIPITOR) 20 MG tablet Take 20 mg by mouth daily at 6 PM.    . gabapentin (NEURONTIN) 300 MG capsule Take 1 capsule (300 mg total) by mouth at bedtime. 30 capsule 0  . insulin glargine (LANTUS) 100 UNIT/ML injection Inject 60 Units into the skin at bedtime.    . lidocaine-prilocaine (EMLA) cream Apply to  affected area once 30 g 6  . linagliptin (TRADJENTA) 5 MG TABS tablet Take 5 mg by mouth daily.     Marland Kitchen lisinopril (PRINIVIL,ZESTRIL) 10 MG tablet Take 10 mg by mouth daily.    . traMADol (ULTRAM) 50 MG tablet Take 1 tablet (50 mg total) by mouth 2 (two) times daily. 60 tablet 2    No current facility-administered medications for this visit.     OBJECTIVE: Vitals:   05/22/17 0930  BP: 105/69  Pulse: 80  Resp: 18  Temp: (!) 93.7 F (34.3 C)     Body mass index is 39.57 kg/m.    ECOG FS:1 - Symptomatic but completely ambulatory  General: Well-developed, well-nourished, no acute distress. Eyes: Pink conjunctiva, anicteric sclera. Lungs: Clear to auscultation bilaterally. Heart: Regular rate and rhythm. No rubs, murmurs, or gallops. Abdomen: Soft, nontender, nondistended. No organomegaly noted, normoactive bowel sounds. Musculoskeletal: No edema, cyanosis, or clubbing. Neuro: Alert, answering all questions appropriately. Cranial nerves grossly intact. Skin: No rashes or petechiae noted. Psych: Normal affect.   LAB RESULTS:  Lab Results  Component Value Date   NA 136 05/22/2017   K 4.2 05/22/2017   CL 104 05/22/2017   CO2 25 05/22/2017   GLUCOSE 216 (H) 05/22/2017   BUN 23 (H) 05/22/2017   CREATININE 0.84 05/22/2017   CALCIUM 9.2 05/22/2017   PROT 6.9 05/22/2017   ALBUMIN 3.6 05/22/2017   AST 23 05/22/2017   ALT 17 05/22/2017   ALKPHOS 84 05/22/2017   BILITOT 0.6 05/22/2017   GFRNONAA >60 05/22/2017   GFRAA >60 05/22/2017    Lab Results  Component Value Date   WBC 4.1 05/22/2017   NEUTROABS 2.8 05/22/2017   HGB 10.8 (L) 05/22/2017   HCT 31.4 (L) 05/22/2017   MCV 87.5 05/22/2017   PLT 143 (L) 05/22/2017   Lab Results  Component Value Date   IRON 63 03/30/2016   TIBC 290 03/30/2016   IRONPCTSAT 22 03/30/2016    Lab Results  Component Value Date   FERRITIN 126 03/30/2016     STUDIES: Mr Jeri Cos ZS Contrast  Result Date: 04/27/2017 CLINICAL DATA:  Dizziness and balance issues. Problems for 1 year, now constant. Lung cancer. EXAM: MRI HEAD WITHOUT AND WITH CONTRAST TECHNIQUE: Multiplanar, multiecho pulse sequences of the brain and surrounding structures were obtained without and with intravenous contrast. CONTRAST:  62mL  MULTIHANCE GADOBENATE DIMEGLUMINE 529 MG/ML IV SOLN COMPARISON:  01/05/2016.  PET scan 01/13/2017. FINDINGS: Brain: No evidence for acute stroke, acute hemorrhage, mass lesion, hydrocephalus, or extra-axial fluid. Normal for age cerebral volume. Mild subcortical and periventricular T2 and FLAIR hyperintensities, likely chronic microvascular ischemic change. Vascular: Normal flow voids. Skull and upper cervical spine: Normal marrow signal. On sagittal T1 weighted images, there is evidence for significant impaction of the cerebellar tonsils into the upper cervical canal, downward displacement of 16 mm below the foramen magnum, with abnormal peg like shape, consistent Chiari I malformation. There is compression, but not abnormal signal, at the cervicomedullary junction. Empty sella is noted, with flattening of the gland, slight expansion of the sella. Sinuses/Orbits: No orbital masses or proptosis. Globes appear symmetric. Sinuses appear well aerated, without evidence for air-fluid level. Other: No nasopharyngeal pathology or mastoid fluid. Scalp and other visualized extracranial soft tissues grossly unremarkable. IMPRESSION: Chiari I malformation, with slight progression since 2017. No hydrocephalus, but there is increased downward displacement of cerebellar tonsils. Neurosurgical consultation is warranted, as the degree of tonsillar herniation appears to be causing significant mass  effect on the cervicomedullary junction. Electronically Signed   By: Staci Righter M.D.   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 She then underwent 2 cycles of consolidation carboplatinum and Taxol completing on Apr 27, 2016.  CT scan results from Apr 18, 2017 reviewed independently with  progression of disease. A referral has been made to radiation oncology to assess whether additional XRT is possible. Proceed with cycle 2 of nivolumab today. Return to clinic in 2 weeks for consideration of cycle 3. 2. Headaches/dizziness: MRI of the brain reviewed independently and reported as above with no obvious metastatic disease. Neurosurgical referral was placed. 3. Anemia: Improving. Previously iron stores were within normal limits, monitor.  4. Thrombocytopenia: Mild, monitor. 5. Hyperglycemia: Continue diabetic medications as prescribed. 6. Urinary symptoms: UA is negative, urine culture pending.   Patient expressed understanding and was in agreement with this plan. She also understands that She can call clinic at any time with any questions, concerns, or complaints.    [name], MD   05/23/2017 8:19 AM

## 2017-05-22 ENCOUNTER — Inpatient Hospital Stay: Payer: PPO

## 2017-05-22 ENCOUNTER — Ambulatory Visit
Admission: RE | Admit: 2017-05-22 | Discharge: 2017-05-22 | Disposition: A | Discharge status: Home / Self Care | Payer: PPO | Source: Ambulatory Visit | Attending: Radiation Oncology | Admitting: Radiation Oncology

## 2017-05-22 ENCOUNTER — Inpatient Hospital Stay (HOSPITAL_BASED_OUTPATIENT_CLINIC_OR_DEPARTMENT_OTHER): Payer: PPO | Admitting: Oncology

## 2017-05-22 VITALS — BP 105/69 | HR 80 | Temp 93.7°F | Resp 18 | Wt 245.2 lb

## 2017-05-22 DIAGNOSIS — Z79899 Other long term (current) drug therapy: Secondary | ICD-10-CM | POA: Diagnosis not present

## 2017-05-22 DIAGNOSIS — Z5111 Encounter for antineoplastic chemotherapy: Secondary | ICD-10-CM | POA: Diagnosis not present

## 2017-05-22 DIAGNOSIS — F17210 Nicotine dependence, cigarettes, uncomplicated: Secondary | ICD-10-CM

## 2017-05-22 DIAGNOSIS — D696 Thrombocytopenia, unspecified: Secondary | ICD-10-CM

## 2017-05-22 DIAGNOSIS — D649 Anemia, unspecified: Secondary | ICD-10-CM | POA: Diagnosis not present

## 2017-05-22 DIAGNOSIS — Z510 Encounter for antineoplastic radiation therapy: Secondary | ICD-10-CM | POA: Diagnosis not present

## 2017-05-22 DIAGNOSIS — R300 Dysuria: Secondary | ICD-10-CM

## 2017-05-22 DIAGNOSIS — C3412 Malignant neoplasm of upper lobe, left bronchus or lung: Secondary | ICD-10-CM

## 2017-05-22 DIAGNOSIS — Z923 Personal history of irradiation: Secondary | ICD-10-CM

## 2017-05-22 LAB — CBC WITH DIFFERENTIAL/PLATELET
BASOS PCT: 1 %
Basophils Absolute: 0 10*3/uL (ref 0–0.1)
EOS ABS: 0.1 10*3/uL (ref 0–0.7)
Eosinophils Relative: 2 %
HEMATOCRIT: 31.4 % — AB (ref 35.0–47.0)
Hemoglobin: 10.8 g/dL — ABNORMAL LOW (ref 12.0–16.0)
LYMPHS ABS: 0.8 10*3/uL — AB (ref 1.0–3.6)
Lymphocytes Relative: 20 %
MCH: 30 pg (ref 26.0–34.0)
MCHC: 34.3 g/dL (ref 32.0–36.0)
MCV: 87.5 fL (ref 80.0–100.0)
MONO ABS: 0.4 10*3/uL (ref 0.2–0.9)
MONOS PCT: 9 %
Neutro Abs: 2.8 10*3/uL (ref 1.4–6.5)
Neutrophils Relative %: 68 %
Platelets: 143 10*3/uL — ABNORMAL LOW (ref 150–440)
RBC: 3.6 MIL/uL — ABNORMAL LOW (ref 3.80–5.20)
RDW: 14.1 % (ref 11.5–14.5)
WBC: 4.1 10*3/uL (ref 3.6–11.0)

## 2017-05-22 LAB — URINALYSIS, COMPLETE (UACMP) WITH MICROSCOPIC
BACTERIA UA: NONE SEEN
Bilirubin Urine: NEGATIVE
Glucose, UA: NEGATIVE mg/dL
Hgb urine dipstick: NEGATIVE
Ketones, ur: NEGATIVE mg/dL
Leukocytes, UA: NEGATIVE
Nitrite: NEGATIVE
PROTEIN: NEGATIVE mg/dL
SPECIFIC GRAVITY, URINE: 1.018 (ref 1.005–1.030)
pH: 5 (ref 5.0–8.0)

## 2017-05-22 LAB — COMPREHENSIVE METABOLIC PANEL
ALBUMIN: 3.6 g/dL (ref 3.5–5.0)
ALK PHOS: 84 U/L (ref 38–126)
ALT: 17 U/L (ref 14–54)
ANION GAP: 7 (ref 5–15)
AST: 23 U/L (ref 15–41)
BILIRUBIN TOTAL: 0.6 mg/dL (ref 0.3–1.2)
BUN: 23 mg/dL — ABNORMAL HIGH (ref 6–20)
CALCIUM: 9.2 mg/dL (ref 8.9–10.3)
CO2: 25 mmol/L (ref 22–32)
Chloride: 104 mmol/L (ref 101–111)
Creatinine, Ser: 0.84 mg/dL (ref 0.44–1.00)
GFR calc non Af Amer: >60 mL/min (ref >60)
GLUCOSE: 216 mg/dL — AB (ref 65–99)
POTASSIUM: 4.2 mmol/L (ref 3.5–5.1)
SODIUM: 136 mmol/L (ref 135–145)
TOTAL PROTEIN: 6.9 g/dL (ref 6.5–8.1)

## 2017-05-22 MED ORDER — HEPARIN SOD (PORK) LOCK FLUSH 100 UNIT/ML IV SOLN
500.0000 [IU] | Freq: Once | INTRAVENOUS | Status: AC
  Administered 2017-05-22: 500 [IU] via INTRAVENOUS
  Filled 2017-05-22: qty 5

## 2017-05-22 MED ORDER — SODIUM CHLORIDE 0.9 % IV SOLN
240.0000 mg | Freq: Once | INTRAVENOUS | Status: AC
  Administered 2017-05-22: 240 mg via INTRAVENOUS
  Filled 2017-05-22: qty 24

## 2017-05-22 MED ORDER — SODIUM CHLORIDE 0.9 % IV SOLN
Freq: Once | INTRAVENOUS | Status: AC
Start: 2017-05-22 — End: 2017-05-22
  Administered 2017-05-22: 10:00:00 via INTRAVENOUS
  Filled 2017-05-22: qty 1000

## 2017-05-22 MED ORDER — SODIUM CHLORIDE 0.9% FLUSH
10.0000 mL | Freq: Once | INTRAVENOUS | Status: AC
  Administered 2017-05-22: 10 mL via INTRAVENOUS
  Filled 2017-05-22: qty 10

## 2017-05-22 NOTE — Progress Notes (Signed)
Patient here today for follow up.  Patient c/o decrease urine flow and left breast pain.

## 2017-05-23 ENCOUNTER — Ambulatory Visit
Admission: RE | Admit: 2017-05-23 | Discharge: 2017-05-23 | Disposition: A | Discharge status: Home / Self Care | Payer: PPO | Source: Ambulatory Visit | Attending: Radiation Oncology | Admitting: Radiation Oncology

## 2017-05-23 DIAGNOSIS — C3412 Malignant neoplasm of upper lobe, left bronchus or lung: Secondary | ICD-10-CM | POA: Diagnosis not present

## 2017-05-23 DIAGNOSIS — Z510 Encounter for antineoplastic radiation therapy: Secondary | ICD-10-CM | POA: Diagnosis not present

## 2017-05-23 DIAGNOSIS — F17210 Nicotine dependence, cigarettes, uncomplicated: Secondary | ICD-10-CM | POA: Diagnosis not present

## 2017-05-23 LAB — THYROID PANEL WITH TSH
FREE THYROXINE INDEX: 2.5 (ref 1.2–4.9)
T3 Uptake Ratio: 30 % (ref 24–39)
T4, Total: 8.3 ug/dL (ref 4.5–12.0)
TSH: 1.23 u[IU]/mL (ref 0.450–4.500)

## 2017-05-24 ENCOUNTER — Ambulatory Visit
Admission: RE | Admit: 2017-05-24 | Discharge: 2017-05-24 | Disposition: A | Discharge status: Home / Self Care | Payer: PPO | Source: Ambulatory Visit | Attending: Radiation Oncology | Admitting: Radiation Oncology

## 2017-05-24 DIAGNOSIS — Z510 Encounter for antineoplastic radiation therapy: Secondary | ICD-10-CM | POA: Diagnosis not present

## 2017-05-24 DIAGNOSIS — C3412 Malignant neoplasm of upper lobe, left bronchus or lung: Secondary | ICD-10-CM | POA: Diagnosis not present

## 2017-05-24 DIAGNOSIS — F17210 Nicotine dependence, cigarettes, uncomplicated: Secondary | ICD-10-CM | POA: Diagnosis not present

## 2017-05-25 ENCOUNTER — Ambulatory Visit
Admission: RE | Admit: 2017-05-25 | Discharge: 2017-05-25 | Disposition: A | Discharge status: Home / Self Care | Payer: PPO | Source: Ambulatory Visit | Attending: Radiation Oncology | Admitting: Radiation Oncology

## 2017-05-25 DIAGNOSIS — C3412 Malignant neoplasm of upper lobe, left bronchus or lung: Secondary | ICD-10-CM | POA: Diagnosis not present

## 2017-05-25 DIAGNOSIS — F17210 Nicotine dependence, cigarettes, uncomplicated: Secondary | ICD-10-CM | POA: Diagnosis not present

## 2017-05-25 DIAGNOSIS — Z510 Encounter for antineoplastic radiation therapy: Secondary | ICD-10-CM | POA: Diagnosis not present

## 2017-05-25 LAB — URINE CULTURE: CULTURE: NO GROWTH

## 2017-05-26 ENCOUNTER — Ambulatory Visit
Admission: RE | Admit: 2017-05-26 | Discharge: 2017-05-26 | Disposition: A | Discharge status: Home / Self Care | Payer: PPO | Source: Ambulatory Visit | Attending: Radiation Oncology | Admitting: Radiation Oncology

## 2017-05-26 DIAGNOSIS — C3412 Malignant neoplasm of upper lobe, left bronchus or lung: Secondary | ICD-10-CM | POA: Diagnosis not present

## 2017-05-26 DIAGNOSIS — F17210 Nicotine dependence, cigarettes, uncomplicated: Secondary | ICD-10-CM | POA: Diagnosis not present

## 2017-05-26 DIAGNOSIS — Z510 Encounter for antineoplastic radiation therapy: Secondary | ICD-10-CM | POA: Diagnosis not present

## 2017-05-29 ENCOUNTER — Ambulatory Visit
Admission: RE | Admit: 2017-05-29 | Discharge: 2017-05-29 | Disposition: A | Discharge status: Home / Self Care | Payer: PPO | Source: Ambulatory Visit | Attending: Radiation Oncology | Admitting: Radiation Oncology

## 2017-05-29 DIAGNOSIS — Z510 Encounter for antineoplastic radiation therapy: Secondary | ICD-10-CM | POA: Diagnosis not present

## 2017-05-29 DIAGNOSIS — F17210 Nicotine dependence, cigarettes, uncomplicated: Secondary | ICD-10-CM | POA: Diagnosis not present

## 2017-05-29 DIAGNOSIS — C3412 Malignant neoplasm of upper lobe, left bronchus or lung: Secondary | ICD-10-CM | POA: Diagnosis not present

## 2017-05-30 ENCOUNTER — Ambulatory Visit
Admission: RE | Admit: 2017-05-30 | Discharge: 2017-05-30 | Disposition: A | Discharge status: Home / Self Care | Payer: PPO | Source: Ambulatory Visit | Attending: Radiation Oncology | Admitting: Radiation Oncology

## 2017-05-30 DIAGNOSIS — F17210 Nicotine dependence, cigarettes, uncomplicated: Secondary | ICD-10-CM | POA: Diagnosis not present

## 2017-05-30 DIAGNOSIS — C3412 Malignant neoplasm of upper lobe, left bronchus or lung: Secondary | ICD-10-CM | POA: Diagnosis not present

## 2017-05-30 DIAGNOSIS — Z510 Encounter for antineoplastic radiation therapy: Secondary | ICD-10-CM | POA: Diagnosis not present

## 2017-05-31 ENCOUNTER — Ambulatory Visit: Payer: PPO

## 2017-05-31 ENCOUNTER — Ambulatory Visit
Admission: RE | Admit: 2017-05-31 | Discharge: 2017-05-31 | Disposition: A | Discharge status: Home / Self Care | Payer: PPO | Source: Ambulatory Visit | Attending: Radiation Oncology | Admitting: Radiation Oncology

## 2017-05-31 DIAGNOSIS — Z1389 Encounter for screening for other disorder: Secondary | ICD-10-CM | POA: Diagnosis not present

## 2017-05-31 DIAGNOSIS — Z0000 Encounter for general adult medical examination without abnormal findings: Secondary | ICD-10-CM | POA: Diagnosis not present

## 2017-05-31 DIAGNOSIS — C3412 Malignant neoplasm of upper lobe, left bronchus or lung: Secondary | ICD-10-CM | POA: Diagnosis not present

## 2017-05-31 DIAGNOSIS — Z510 Encounter for antineoplastic radiation therapy: Secondary | ICD-10-CM | POA: Diagnosis not present

## 2017-05-31 DIAGNOSIS — F17210 Nicotine dependence, cigarettes, uncomplicated: Secondary | ICD-10-CM | POA: Diagnosis not present

## 2017-06-01 ENCOUNTER — Ambulatory Visit
Admission: RE | Admit: 2017-06-01 | Discharge: 2017-06-01 | Disposition: A | Discharge status: Home / Self Care | Payer: PPO | Source: Ambulatory Visit | Attending: Radiation Oncology | Admitting: Radiation Oncology

## 2017-06-01 DIAGNOSIS — Z510 Encounter for antineoplastic radiation therapy: Secondary | ICD-10-CM | POA: Diagnosis not present

## 2017-06-01 DIAGNOSIS — C3412 Malignant neoplasm of upper lobe, left bronchus or lung: Secondary | ICD-10-CM | POA: Diagnosis not present

## 2017-06-01 DIAGNOSIS — F17210 Nicotine dependence, cigarettes, uncomplicated: Secondary | ICD-10-CM | POA: Diagnosis not present

## 2017-06-04 NOTE — Progress Notes (Signed)
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  INTERVAL HISTORY: Patient returns to clinic today for further evaluation and consideration of cycle 3 of nivolumab.  She continues to have headaches and dizziness, but has not yet seen neurosurgery. Her appointment is tomorrow.  She does complain of worsening weakness and fatigue. She has no other neurologic complaints. She does not complain of peripheral neuropathy today. She denies any recent fevers or illnesses. She denies any chest pain, cough, hemoptysis, or shortness of breath. She denies constipation, diarrhea, nausea, or vomiting.  She has some hesitancy with urination. Patient offers no further specific complaints today.  REVIEW OF SYSTEMS:   Review of Systems  Constitutional: Positive for malaise/fatigue. Negative for fever and weight loss.  Respiratory: Negative for cough, hemoptysis and shortness of breath.   Cardiovascular: Negative.  Negative for chest pain and leg swelling.  Gastrointestinal: Negative.  Negative for abdominal pain.  Genitourinary: Negative for flank pain.  Musculoskeletal: Positive for joint pain and myalgias.  Skin: Negative.  Negative for rash.  Neurological: Positive for dizziness, weakness and headaches. Negative for sensory change.  Psychiatric/Behavioral: Negative.  The patient is not nervous/anxious.     As per HPI. Otherwise, a complete review of systems is negative.  PAST MEDICAL HISTORY: Past Medical History:  Diagnosis Date  . Arthritis   . Blood dyscrasia   . Cancer of  left lung ([place]) 08/2015   Rad + chemo tx's.  . Diabetes mellitus without complication ([place])   . Diabetic neuropathy ([place])   . High cholesterol   . Hypertension     PAST SURGICAL HISTORY: Past Surgical History:  Procedure Laterality Date  . ABDOMINAL HYSTERECTOMY    . CESAREAN SECTION    . ELECTROMAGNETIC NAVIGATION BROCHOSCOPY N/A 11/26/2015   Procedure: ELECTROMAGNETIC NAVIGATION BRONCHOSCOPY;  Surgeon: Flora Lipps, MD;  Location: [facility];  Service: Cardiopulmonary;  Laterality: N/A;  . ENDOBRONCHIAL ULTRASOUND N/A 11/26/2015   Procedure: ENDOBRONCHIAL ULTRASOUND;  Surgeon: Flora Lipps, MD;  Location: [facility]S;  Service: Cardiopulmonary;  Laterality: N/A;  . PERIPHERAL VASCULAR CATHETERIZATION N/A 12/09/2015   Procedure: Glori Luis Cath Insertion;  Surgeon: Katha Cabal, MD;  Location: [facility];  Service: Cardiovascular;  Laterality: N/A;    FAMILY HISTORY: Reviewed and unchanged. No reported history of malignancy or chronic disease.     ADVANCED DIRECTIVES:    HEALTH MAINTENANCE: Social History  Substance Use Topics  . Smoking status: Current Every Day Smoker    Packs/day: 0.30    Years: 35.00    Types: Cigarettes  . Smokeless tobacco: Never Used     Comment: workiing on quitting, down to 2 packs per week now  . Alcohol use No     No Known Allergies  Current Outpatient Prescriptions  Medication Sig Dispense Refill  . atorvastatin (LIPITOR) 20 MG tablet Take 20 mg by mouth daily at 6 PM.    . gabapentin (NEURONTIN) 300 MG capsule Take 1 capsule (300 mg total) by mouth at bedtime. 30 capsule 0  . insulin glargine (LANTUS) 100 UNIT/ML injection Inject 60 Units into the skin at bedtime.    Marland Kitchen  lidocaine-prilocaine (EMLA) cream Apply to affected area once 30 g 6  . linagliptin (TRADJENTA) 5 MG TABS tablet Take 5 mg by mouth daily.     Marland Kitchen lisinopril (PRINIVIL,ZESTRIL) 10 MG tablet Take 10 mg by mouth daily.    Marland Kitchen oxyCODONE (OXY IR/ROXICODONE) 5 MG immediate  release tablet Take 1 tablet (5 mg total) by mouth every 6 (six) hours as needed for severe pain. 30 tablet 0  . traMADol (ULTRAM) 50 MG tablet Take 1 tablet (50 mg total) by mouth 2 (two) times daily. (Patient not taking: Reported on 06/05/2017) 60 tablet 2   No current facility-administered medications for this visit.    Facility-Administered Medications Ordered in Other Visits  Medication Dose Route Frequency Provider Last Rate Last Dose  . sodium chloride flush (NS) 0.9 % injection 10 mL  10 mL Intravenous PRN [name], MD   10 mL at 06/05/17 0820    OBJECTIVE: Vitals:   06/05/17 0914  BP: 125/79  Pulse: 85  Resp: 20  Temp: (!) 96.3 F (35.7 C)     Body mass index is 39.75 kg/m.    ECOG FS:1 - Symptomatic but completely ambulatory  General: Well-developed, well-nourished, no acute distress. Eyes: Pink conjunctiva, anicteric sclera. Lungs: Clear to auscultation bilaterally. Heart: Regular rate and rhythm. No rubs, murmurs, or gallops. Abdomen: Soft, nontender, nondistended. No organomegaly noted, normoactive bowel sounds. Musculoskeletal: No edema, cyanosis, or clubbing. Neuro: Alert, answering all questions appropriately. Cranial nerves grossly intact. Skin: No rashes or petechiae noted. Psych: Normal affect.   LAB RESULTS:  Lab Results  Component Value Date   NA 139 06/05/2017   K 4.2 06/05/2017   CL 108 06/05/2017   CO2 25 06/05/2017   GLUCOSE 230 (H) 06/05/2017   BUN 17 06/05/2017   CREATININE 0.73 06/05/2017   CALCIUM 9.2 06/05/2017   PROT 6.8 06/05/2017   ALBUMIN 3.4 (L) 06/05/2017   AST 39 06/05/2017   ALT 33 06/05/2017   ALKPHOS 92 06/05/2017   BILITOT 0.4 06/05/2017   GFRNONAA >60 06/05/2017   GFRAA >60 06/05/2017    Lab Results  Component Value Date   WBC 3.4 (L) 06/05/2017   NEUTROABS 2.4 06/05/2017   HGB 10.4 (L) 06/05/2017   HCT 30.5 (L) 06/05/2017   MCV 87.1 06/05/2017   PLT 135 (L) 06/05/2017   Lab Results  Component Value  Date   IRON 63 03/30/2016   TIBC 290 03/30/2016   IRONPCTSAT 22 03/30/2016    Lab Results  Component Value Date   FERRITIN 126 03/30/2016     STUDIES: No results found.  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 She then underwent 2 cycles of consolidation carboplatinum and Taxol completing on Apr 27, 2016.  CT scan results from Apr 18, 2017 reviewed independently with progression of disease. Patient has now completed additional XRT. Proceed with cycle 3 of nivolumab today. Return to clinic in 2 weeks for consideration of cycle 4. 2. Headaches/dizziness: MRI of the brain reviewed independently and reported as above with no obvious metastatic disease. Patient has evaluation by neurosurgery tomorrow. 3. Anemia: Improving. Previously iron stores were within normal limits, monitor.  4. Thrombocytopenia: Mild, monitor. 5. Hyperglycemia: Continue diabetic medications as prescribed. 6. Urinary symptoms: UA and culture are negative.  7. Pain: Patient was given a prescription for oxycodone today.   Patient expressed understanding and was in agreement with this plan. She also understands that She can call clinic at any time with any questions, concerns, or complaints.    [name], MD   06/05/2017 1:40 PM

## 2017-06-05 ENCOUNTER — Inpatient Hospital Stay: Payer: PPO | Attending: Oncology | Admitting: Oncology

## 2017-06-05 ENCOUNTER — Inpatient Hospital Stay: Payer: PPO

## 2017-06-05 VITALS — BP 125/79 | HR 85 | Temp 96.3°F | Resp 20 | Wt 246.3 lb

## 2017-06-05 DIAGNOSIS — Z5111 Encounter for antineoplastic chemotherapy: Secondary | ICD-10-CM | POA: Insufficient documentation

## 2017-06-05 DIAGNOSIS — R51 Headache: Secondary | ICD-10-CM | POA: Insufficient documentation

## 2017-06-05 DIAGNOSIS — C3412 Malignant neoplasm of upper lobe, left bronchus or lung: Secondary | ICD-10-CM | POA: Diagnosis not present

## 2017-06-05 DIAGNOSIS — F17210 Nicotine dependence, cigarettes, uncomplicated: Secondary | ICD-10-CM | POA: Insufficient documentation

## 2017-06-05 DIAGNOSIS — E7800 Pure hypercholesterolemia, unspecified: Secondary | ICD-10-CM | POA: Diagnosis not present

## 2017-06-05 DIAGNOSIS — Z794 Long term (current) use of insulin: Secondary | ICD-10-CM | POA: Diagnosis not present

## 2017-06-05 DIAGNOSIS — R42 Dizziness and giddiness: Secondary | ICD-10-CM

## 2017-06-05 DIAGNOSIS — Z79899 Other long term (current) drug therapy: Secondary | ICD-10-CM | POA: Diagnosis not present

## 2017-06-05 DIAGNOSIS — R531 Weakness: Secondary | ICD-10-CM | POA: Insufficient documentation

## 2017-06-05 DIAGNOSIS — I10 Essential (primary) hypertension: Secondary | ICD-10-CM | POA: Insufficient documentation

## 2017-06-05 DIAGNOSIS — D649 Anemia, unspecified: Secondary | ICD-10-CM | POA: Insufficient documentation

## 2017-06-05 DIAGNOSIS — E1165 Type 2 diabetes mellitus with hyperglycemia: Secondary | ICD-10-CM | POA: Insufficient documentation

## 2017-06-05 DIAGNOSIS — D696 Thrombocytopenia, unspecified: Secondary | ICD-10-CM | POA: Insufficient documentation

## 2017-06-05 DIAGNOSIS — R5383 Other fatigue: Secondary | ICD-10-CM | POA: Insufficient documentation

## 2017-06-05 LAB — CBC WITH DIFFERENTIAL/PLATELET
BASOS PCT: 1 %
Basophils Absolute: 0 10*3/uL (ref 0–0.1)
Eosinophils Absolute: 0.1 10*3/uL (ref 0–0.7)
Eosinophils Relative: 3 %
HCT: 30.5 % — ABNORMAL LOW (ref 35.0–47.0)
HEMOGLOBIN: 10.4 g/dL — AB (ref 12.0–16.0)
Lymphocytes Relative: 16 %
Lymphs Abs: 0.6 10*3/uL — ABNORMAL LOW (ref 1.0–3.6)
MCH: 29.7 pg (ref 26.0–34.0)
MCHC: 34.1 g/dL (ref 32.0–36.0)
MCV: 87.1 fL (ref 80.0–100.0)
Monocytes Absolute: 0.4 10*3/uL (ref 0.2–0.9)
Monocytes Relative: 11 %
NEUTROS PCT: 69 %
Neutro Abs: 2.4 10*3/uL (ref 1.4–6.5)
Platelets: 135 10*3/uL — ABNORMAL LOW (ref 150–440)
RBC: 3.51 MIL/uL — AB (ref 3.80–5.20)
RDW: 14.3 % (ref 11.5–14.5)
WBC: 3.4 10*3/uL — AB (ref 3.6–11.0)

## 2017-06-05 LAB — COMPREHENSIVE METABOLIC PANEL
ALBUMIN: 3.4 g/dL — AB (ref 3.5–5.0)
ALK PHOS: 92 U/L (ref 38–126)
ALT: 33 U/L (ref 14–54)
AST: 39 U/L (ref 15–41)
Anion gap: 6 (ref 5–15)
BUN: 17 mg/dL (ref 6–20)
CALCIUM: 9.2 mg/dL (ref 8.9–10.3)
CO2: 25 mmol/L (ref 22–32)
CREATININE: 0.73 mg/dL (ref 0.44–1.00)
Chloride: 108 mmol/L (ref 101–111)
GFR calc Af Amer: >60 mL/min (ref >60)
GFR calc non Af Amer: >60 mL/min (ref >60)
GLUCOSE: 230 mg/dL — AB (ref 65–99)
Potassium: 4.2 mmol/L (ref 3.5–5.1)
Sodium: 139 mmol/L (ref 135–145)
Total Bilirubin: 0.4 mg/dL (ref 0.3–1.2)
Total Protein: 6.8 g/dL (ref 6.5–8.1)

## 2017-06-05 MED ORDER — SODIUM CHLORIDE 0.9% FLUSH
10.0000 mL | INTRAVENOUS | Status: DC | PRN
Start: 2017-06-05 — End: 2017-06-05
  Administered 2017-06-05: 10 mL via INTRAVENOUS
  Filled 2017-06-05: qty 10

## 2017-06-05 MED ORDER — OXYCODONE HCL 5 MG PO TABS: 5.0000 mg | ORAL_TABLET | Freq: Four times a day (QID) | ORAL | 0 refills | Status: DC | PRN

## 2017-06-05 MED ORDER — SODIUM CHLORIDE 0.9 % IV SOLN
240.0000 mg | Freq: Once | INTRAVENOUS | Status: AC
  Administered 2017-06-05: 240 mg via INTRAVENOUS
  Filled 2017-06-05: qty 24

## 2017-06-05 MED ORDER — HEPARIN SOD (PORK) LOCK FLUSH 100 UNIT/ML IV SOLN
500.0000 [IU] | Freq: Once | INTRAVENOUS | Status: AC
  Administered 2017-06-05: 500 [IU] via INTRAVENOUS

## 2017-06-05 MED ORDER — SODIUM CHLORIDE 0.9 % IV SOLN
Freq: Once | INTRAVENOUS | Status: AC
  Administered 2017-06-05: 10:00:00 via INTRAVENOUS
  Filled 2017-06-05: qty 1000

## 2017-06-05 NOTE — Progress Notes (Signed)
Patient denies any concerns today.  

## 2017-06-06 DIAGNOSIS — G935 Compression of brain: Secondary | ICD-10-CM | POA: Diagnosis not present

## 2017-06-06 LAB — THYROID PANEL WITH TSH
Free Thyroxine Index: 3.4 (ref 1.2–4.9)
T3 UPTAKE RATIO: 32 % (ref 24–39)
T4 TOTAL: 10.7 ug/dL (ref 4.5–12.0)
TSH: 0.203 u[IU]/mL — AB (ref 0.450–4.500)

## 2017-06-18 NOTE — Progress Notes (Signed)
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  INTERVAL HISTORY: Patient returns to clinic today for further evaluation and consideration of cycle 4 of nivolumab.  She continues to have headaches and dizziness and is actively being evaluated by neurosurgery. She continues to have mild weakness and fatigue. She has no other neurologic complaints. She does not complain of peripheral neuropathy today. She denies any recent fevers or illnesses. She denies any chest pain, cough, hemoptysis, or shortness of breath. She denies constipation, diarrhea, nausea, or vomiting.  She has some hesitancy with urination. Patient offers no further specific complaints today.  REVIEW OF SYSTEMS:   Review of Systems  Constitutional: Positive for malaise/fatigue. Negative for fever and weight loss.  Respiratory: Negative for cough, hemoptysis and shortness of breath.   Cardiovascular: Negative.  Negative for chest pain and leg swelling.  Gastrointestinal: Negative.  Negative for abdominal pain.  Genitourinary: Negative for flank pain.  Musculoskeletal: Positive for joint pain and myalgias.  Skin: Negative.  Negative for rash.  Neurological: Positive for dizziness, weakness and headaches. Negative for sensory change.  Psychiatric/Behavioral: Negative.  The patient is not nervous/anxious.     As per HPI. Otherwise, a complete review of systems is negative.  PAST MEDICAL HISTORY: Past Medical History:  Diagnosis Date  . Arthritis   . Blood dyscrasia   . Cancer of left lung ([place])  08/2015   Rad + chemo tx's.  . Diabetes mellitus without complication ([place])   . Diabetic neuropathy ([place])   . High cholesterol   . Hypertension     PAST SURGICAL HISTORY: Past Surgical History:  Procedure Laterality Date  . ABDOMINAL HYSTERECTOMY    . CESAREAN SECTION    . ELECTROMAGNETIC NAVIGATION BROCHOSCOPY N/A 11/26/2015   Procedure: ELECTROMAGNETIC NAVIGATION BRONCHOSCOPY;  Surgeon: Flora Lipps, MD;  Location: [facility];  Service: Cardiopulmonary;  Laterality: N/A;  . ENDOBRONCHIAL ULTRASOUND N/A 11/26/2015   Procedure: ENDOBRONCHIAL ULTRASOUND;  Surgeon: Flora Lipps, MD;  Location: [facility];  Service: Cardiopulmonary;  Laterality: N/A;  . PERIPHERAL VASCULAR CATHETERIZATION N/A 12/09/2015   Procedure: Glori Luis Cath Insertion;  Surgeon: Katha Cabal, MD;  Location: [facility];  Service: Cardiovascular;  Laterality: N/A;    FAMILY HISTORY: Reviewed and unchanged. No reported history of malignancy or chronic disease.     ADVANCED DIRECTIVES:    HEALTH MAINTENANCE: Social History  Substance Use Topics  . Smoking status: Current Every Day Smoker    Packs/day: 0.30    Years: 35.00    Types: Cigarettes  . Smokeless tobacco: Never Used     Comment: workiing on quitting, down to 2 packs per week now  . Alcohol use No     No Known Allergies  Current Outpatient Prescriptions  Medication Sig Dispense Refill  . atorvastatin (LIPITOR) 20 MG tablet Take 20 mg by mouth daily at 6 PM.    . gabapentin (NEURONTIN) 300 MG capsule Take 1 capsule (300 mg total) by mouth at bedtime. 30 capsule 0  . insulin glargine (LANTUS) 100 UNIT/ML injection Inject 60 Units into the skin at bedtime.    . lidocaine-prilocaine (EMLA)  cream Apply to affected area once 30 g 6  . linagliptin (TRADJENTA) 5 MG TABS tablet Take 5 mg by mouth daily.     Marland Kitchen lisinopril (PRINIVIL,ZESTRIL) 10 MG tablet Take 10 mg by mouth daily.    Marland Kitchen oxyCODONE (OXY IR/ROXICODONE) 5 MG immediate release tablet Take 1  tablet (5 mg total) by mouth every 6 (six) hours as needed for severe pain. 30 tablet 0  . traMADol (ULTRAM) 50 MG tablet Take 1 tablet (50 mg total) by mouth 2 (two) times daily. 60 tablet 2   No current facility-administered medications for this visit.     OBJECTIVE: Vitals:   06/19/17 1001  BP: 106/70  Pulse: 90  Resp: 18  Temp: (!) 97.1 F (36.2 C)     Body mass index is 39.41 kg/m.    ECOG FS:1 - Symptomatic but completely ambulatory  General: Well-developed, well-nourished, no acute distress. Eyes: Pink conjunctiva, anicteric sclera. Lungs: Clear to auscultation bilaterally. Heart: Regular rate and rhythm. No rubs, murmurs, or gallops. Abdomen: Soft, nontender, nondistended. No organomegaly noted, normoactive bowel sounds. Musculoskeletal: No edema, cyanosis, or clubbing. Neuro: Alert, answering all questions appropriately. Cranial nerves grossly intact. Skin: No rashes or petechiae noted. Psych: Normal affect.   LAB RESULTS:  Lab Results  Component Value Date   NA 137 06/19/2017   K 4.4 06/19/2017   CL 105 06/19/2017   CO2 27 06/19/2017   GLUCOSE 232 (H) 06/19/2017   BUN 19 06/19/2017   CREATININE 0.70 06/19/2017   CALCIUM 9.8 06/19/2017   PROT 7.3 06/19/2017   ALBUMIN 3.4 (L) 06/19/2017   AST 32 06/19/2017   ALT 45 06/19/2017   ALKPHOS 103 06/19/2017   BILITOT 0.6 06/19/2017   GFRNONAA >60 06/19/2017   GFRAA >60 06/19/2017    Lab Results  Component Value Date   WBC 3.7 06/19/2017   NEUTROABS 2.5 06/19/2017   HGB 10.5 (L) 06/19/2017   HCT 30.4 (L) 06/19/2017   MCV 85.9 06/19/2017   PLT 144 (L) 06/19/2017   Lab Results  Component Value Date   IRON 63 03/30/2016   TIBC 290 03/30/2016   IRONPCTSAT 22 03/30/2016    Lab Results  Component Value Date   FERRITIN 126 03/30/2016     STUDIES: No results found.  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 Stage IIIa squamous cell carcinoma of  the upper lobe of left lung: Patient completed her initial treatment with concurrent chemotherapy and XRT on March 07, 2016. She then underwent 2 cycles of consolidation carboplatinum and Taxol completing on Apr 27, 2016.  CT scan results from Apr 18, 2017 reviewed independently with progression of disease. Patient has now completed additional XRT. Proceed with cycle 4 of nivolumab today. Return to clinic in 2 weeks for consideration of cycle 5. Will reimage after cycle 6-8. 2. Headaches/dizziness: MRI of the brain reviewed independently and reported as above with no obvious metastatic disease. Continue evaluation and treatment per neurosurgery. 3. Anemia: Improving. Previously iron stores were within normal limits, monitor.  4. Thrombocytopenia: Mild, monitor. 5. Hyperglycemia: Continue diabetic medications as prescribed. 6. Pain: Continue oxycodone as prescribed.  Patient expressed understanding and was in agreement with this plan. She also understands that She can call clinic at any time with any questions, concerns, or complaints.    [name], MD   06/21/2017 1:49 PM

## 2017-06-19 ENCOUNTER — Inpatient Hospital Stay: Payer: PPO

## 2017-06-19 ENCOUNTER — Inpatient Hospital Stay (HOSPITAL_BASED_OUTPATIENT_CLINIC_OR_DEPARTMENT_OTHER): Payer: PPO | Admitting: Oncology

## 2017-06-19 VITALS — BP 106/70 | HR 90 | Temp 97.1°F | Resp 18 | Wt 244.2 lb

## 2017-06-19 DIAGNOSIS — I10 Essential (primary) hypertension: Secondary | ICD-10-CM | POA: Diagnosis not present

## 2017-06-19 DIAGNOSIS — Z5111 Encounter for antineoplastic chemotherapy: Secondary | ICD-10-CM | POA: Diagnosis not present

## 2017-06-19 DIAGNOSIS — E1165 Type 2 diabetes mellitus with hyperglycemia: Secondary | ICD-10-CM | POA: Diagnosis not present

## 2017-06-19 DIAGNOSIS — E7800 Pure hypercholesterolemia, unspecified: Secondary | ICD-10-CM | POA: Diagnosis not present

## 2017-06-19 DIAGNOSIS — D696 Thrombocytopenia, unspecified: Secondary | ICD-10-CM | POA: Diagnosis not present

## 2017-06-19 DIAGNOSIS — Z79899 Other long term (current) drug therapy: Secondary | ICD-10-CM

## 2017-06-19 DIAGNOSIS — R5383 Other fatigue: Secondary | ICD-10-CM | POA: Diagnosis not present

## 2017-06-19 DIAGNOSIS — D649 Anemia, unspecified: Secondary | ICD-10-CM | POA: Diagnosis not present

## 2017-06-19 DIAGNOSIS — R51 Headache: Secondary | ICD-10-CM

## 2017-06-19 DIAGNOSIS — C3412 Malignant neoplasm of upper lobe, left bronchus or lung: Secondary | ICD-10-CM

## 2017-06-19 DIAGNOSIS — Z794 Long term (current) use of insulin: Secondary | ICD-10-CM

## 2017-06-19 DIAGNOSIS — R531 Weakness: Secondary | ICD-10-CM | POA: Diagnosis not present

## 2017-06-19 DIAGNOSIS — R42 Dizziness and giddiness: Secondary | ICD-10-CM

## 2017-06-19 DIAGNOSIS — F17210 Nicotine dependence, cigarettes, uncomplicated: Secondary | ICD-10-CM | POA: Diagnosis not present

## 2017-06-19 LAB — CBC WITH DIFFERENTIAL/PLATELET
Basophils Absolute: 0 10*3/uL (ref 0–0.1)
Basophils Relative: 0 %
EOS ABS: 0.1 10*3/uL (ref 0–0.7)
EOS PCT: 3 %
HCT: 30.4 % — ABNORMAL LOW (ref 35.0–47.0)
Hemoglobin: 10.5 g/dL — ABNORMAL LOW (ref 12.0–16.0)
LYMPHS ABS: 0.6 10*3/uL — AB (ref 1.0–3.6)
LYMPHS PCT: 17 %
MCH: 29.7 pg (ref 26.0–34.0)
MCHC: 34.6 g/dL (ref 32.0–36.0)
MCV: 85.9 fL (ref 80.0–100.0)
MONO ABS: 0.4 10*3/uL (ref 0.2–0.9)
MONOS PCT: 12 %
Neutro Abs: 2.5 10*3/uL (ref 1.4–6.5)
Neutrophils Relative %: 68 %
PLATELETS: 144 10*3/uL — AB (ref 150–440)
RBC: 3.54 MIL/uL — ABNORMAL LOW (ref 3.80–5.20)
RDW: 14.1 % (ref 11.5–14.5)
WBC: 3.7 10*3/uL (ref 3.6–11.0)

## 2017-06-19 LAB — COMPREHENSIVE METABOLIC PANEL
ALT: 45 U/L (ref 14–54)
ANION GAP: 5 (ref 5–15)
AST: 32 U/L (ref 15–41)
Albumin: 3.4 g/dL — ABNORMAL LOW (ref 3.5–5.0)
Alkaline Phosphatase: 103 U/L (ref 38–126)
BUN: 19 mg/dL (ref 6–20)
CHLORIDE: 105 mmol/L (ref 101–111)
CO2: 27 mmol/L (ref 22–32)
CREATININE: 0.7 mg/dL (ref 0.44–1.00)
Calcium: 9.8 mg/dL (ref 8.9–10.3)
Glucose, Bld: 232 mg/dL — ABNORMAL HIGH (ref 65–99)
Potassium: 4.4 mmol/L (ref 3.5–5.1)
SODIUM: 137 mmol/L (ref 135–145)
Total Bilirubin: 0.6 mg/dL (ref 0.3–1.2)
Total Protein: 7.3 g/dL (ref 6.5–8.1)

## 2017-06-19 MED ORDER — SODIUM CHLORIDE 0.9 % IV SOLN
240.0000 mg | Freq: Once | INTRAVENOUS | Status: AC
  Administered 2017-06-19: 240 mg via INTRAVENOUS
  Filled 2017-06-19: qty 24

## 2017-06-19 MED ORDER — SODIUM CHLORIDE 0.9 % IV SOLN
Freq: Once | INTRAVENOUS | Status: AC
  Administered 2017-06-19: 11:00:00 via INTRAVENOUS
  Filled 2017-06-19: qty 1000

## 2017-06-19 MED ORDER — SODIUM CHLORIDE 0.9% FLUSH
10.0000 mL | INTRAVENOUS | Status: DC | PRN
  Filled 2017-06-19: qty 10

## 2017-06-19 MED ORDER — HEPARIN SOD (PORK) LOCK FLUSH 100 UNIT/ML IV SOLN
500.0000 [IU] | Freq: Once | INTRAVENOUS | Status: AC | PRN
  Administered 2017-06-19: 500 [IU]

## 2017-06-20 LAB — THYROID PANEL WITH TSH
Free Thyroxine Index: 6.6 — ABNORMAL HIGH (ref 1.2–4.9)
T3 Uptake Ratio: 39 % (ref 24–39)
T4 TOTAL: 16.9 ug/dL — AB (ref 4.5–12.0)
TSH: 0.017 u[IU]/mL — AB (ref 0.450–4.500)

## 2017-06-23 DIAGNOSIS — M50322 Other cervical disc degeneration at C5-C6 level: Secondary | ICD-10-CM | POA: Diagnosis not present

## 2017-06-23 DIAGNOSIS — G935 Compression of brain: Secondary | ICD-10-CM | POA: Diagnosis not present

## 2017-06-23 DIAGNOSIS — M4802 Spinal stenosis, cervical region: Secondary | ICD-10-CM | POA: Diagnosis not present

## 2017-06-23 DIAGNOSIS — M50323 Other cervical disc degeneration at C6-C7 level: Secondary | ICD-10-CM | POA: Diagnosis not present

## 2017-07-03 ENCOUNTER — Inpatient Hospital Stay: Payer: PPO

## 2017-07-03 ENCOUNTER — Inpatient Hospital Stay (HOSPITAL_BASED_OUTPATIENT_CLINIC_OR_DEPARTMENT_OTHER): Payer: PPO | Admitting: Oncology

## 2017-07-03 ENCOUNTER — Encounter: Payer: Self-pay | Admitting: Oncology

## 2017-07-03 VITALS — BP 134/73 | HR 97 | Temp 97.4°F | Wt 241.7 lb

## 2017-07-03 DIAGNOSIS — R5383 Other fatigue: Secondary | ICD-10-CM | POA: Diagnosis not present

## 2017-07-03 DIAGNOSIS — D696 Thrombocytopenia, unspecified: Secondary | ICD-10-CM | POA: Diagnosis not present

## 2017-07-03 DIAGNOSIS — C3412 Malignant neoplasm of upper lobe, left bronchus or lung: Secondary | ICD-10-CM | POA: Diagnosis not present

## 2017-07-03 DIAGNOSIS — E119 Type 2 diabetes mellitus without complications: Secondary | ICD-10-CM | POA: Insufficient documentation

## 2017-07-03 DIAGNOSIS — D649 Anemia, unspecified: Secondary | ICD-10-CM | POA: Diagnosis not present

## 2017-07-03 DIAGNOSIS — R51 Headache: Secondary | ICD-10-CM

## 2017-07-03 DIAGNOSIS — Z79899 Other long term (current) drug therapy: Secondary | ICD-10-CM

## 2017-07-03 DIAGNOSIS — E1165 Type 2 diabetes mellitus with hyperglycemia: Secondary | ICD-10-CM

## 2017-07-03 DIAGNOSIS — E782 Mixed hyperlipidemia: Secondary | ICD-10-CM | POA: Insufficient documentation

## 2017-07-03 DIAGNOSIS — I2510 Atherosclerotic heart disease of native coronary artery without angina pectoris: Secondary | ICD-10-CM | POA: Insufficient documentation

## 2017-07-03 DIAGNOSIS — F17210 Nicotine dependence, cigarettes, uncomplicated: Secondary | ICD-10-CM | POA: Diagnosis not present

## 2017-07-03 DIAGNOSIS — R42 Dizziness and giddiness: Secondary | ICD-10-CM

## 2017-07-03 DIAGNOSIS — I739 Peripheral vascular disease, unspecified: Secondary | ICD-10-CM | POA: Insufficient documentation

## 2017-07-03 DIAGNOSIS — Z5111 Encounter for antineoplastic chemotherapy: Secondary | ICD-10-CM | POA: Diagnosis not present

## 2017-07-03 LAB — CBC WITH DIFFERENTIAL/PLATELET
BASOS PCT: 0 %
Basophils Absolute: 0 10*3/uL (ref 0–0.1)
EOS ABS: 0.1 10*3/uL (ref 0–0.7)
Eosinophils Relative: 4 %
HCT: 29.1 % — ABNORMAL LOW (ref 35.0–47.0)
HEMOGLOBIN: 9.9 g/dL — AB (ref 12.0–16.0)
Lymphocytes Relative: 19 %
Lymphs Abs: 0.7 10*3/uL — ABNORMAL LOW (ref 1.0–3.6)
MCH: 29.3 pg (ref 26.0–34.0)
MCHC: 34 g/dL (ref 32.0–36.0)
MCV: 86 fL (ref 80.0–100.0)
MONOS PCT: 14 %
Monocytes Absolute: 0.5 10*3/uL (ref 0.2–0.9)
NEUTROS PCT: 63 %
Neutro Abs: 2.2 10*3/uL (ref 1.4–6.5)
Platelets: 125 10*3/uL — ABNORMAL LOW (ref 150–440)
RBC: 3.39 MIL/uL — AB (ref 3.80–5.20)
RDW: 13.5 % (ref 11.5–14.5)
WBC: 3.5 10*3/uL — AB (ref 3.6–11.0)

## 2017-07-03 LAB — COMPREHENSIVE METABOLIC PANEL
ALBUMIN: 3.2 g/dL — AB (ref 3.5–5.0)
ALK PHOS: 91 U/L (ref 38–126)
ALT: 40 U/L (ref 14–54)
ANION GAP: 5 (ref 5–15)
AST: 32 U/L (ref 15–41)
BUN: 20 mg/dL (ref 6–20)
CALCIUM: 10 mg/dL (ref 8.9–10.3)
CO2: 25 mmol/L (ref 22–32)
Chloride: 106 mmol/L (ref 101–111)
Creatinine, Ser: 0.66 mg/dL (ref 0.44–1.00)
GFR calc Af Amer: >60 mL/min (ref >60)
GFR calc non Af Amer: >60 mL/min (ref >60)
GLUCOSE: 231 mg/dL — AB (ref 65–99)
Potassium: 4.5 mmol/L (ref 3.5–5.1)
SODIUM: 136 mmol/L (ref 135–145)
Total Bilirubin: 0.5 mg/dL (ref 0.3–1.2)
Total Protein: 6.6 g/dL (ref 6.5–8.1)

## 2017-07-03 MED ORDER — NIVOLUMAB CHEMO INJECTION 100 MG/10ML
240.0000 mg | Freq: Once | INTRAVENOUS | Status: AC
  Administered 2017-07-03: 240 mg via INTRAVENOUS
  Filled 2017-07-03: qty 24

## 2017-07-03 MED ORDER — SODIUM CHLORIDE 0.9 % IV SOLN
Freq: Once | INTRAVENOUS | Status: AC
  Administered 2017-07-03: 11:00:00 via INTRAVENOUS
  Filled 2017-07-03: qty 1000

## 2017-07-03 MED ORDER — SODIUM CHLORIDE 0.9% FLUSH
10.0000 mL | INTRAVENOUS | Status: DC | PRN
  Administered 2017-07-03: 10 mL via INTRAVENOUS
  Filled 2017-07-03: qty 10

## 2017-07-03 MED ORDER — OXYCODONE HCL 5 MG PO TABS: 5.0000 mg | ORAL_TABLET | Freq: Four times a day (QID) | ORAL | 0 refills | Status: DC | PRN

## 2017-07-03 MED ORDER — HEPARIN SOD (PORK) LOCK FLUSH 100 UNIT/ML IV SOLN
500.0000 [IU] | Freq: Once | INTRAVENOUS | Status: AC
  Administered 2017-07-03: 500 [IU] via INTRAVENOUS

## 2017-07-03 NOTE — Progress Notes (Signed)
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  INTERVAL HISTORY: Patient returns to clinic today for further evaluation and consideration of cycle 5 of nivolumab.  She continues to have headaches and dizziness and is actively being evaluated by neurosurgery. She has an appointment tomorrow. Her appetite is getting progressively worse due to the changes with her taste buds. She wishes to meet with a dietician. She continues to have mild weakness and fatigue. Her pain is much better with the use of the pain medication. She complains of a chronic cough with minimal white sputum production. She has no other neurologic complaints. She does not complain of peripheral neuropathy today. She denies any recent fevers or illnesses. She denies any chest pain, hemoptysis, or shortness of breath. She denies constipation, diarrhea, nausea, or vomiting.  She has some hesitancy with urination. Patient offers no further specific complaints today.  REVIEW OF SYSTEMS:   Review of Systems  Constitutional: Positive for malaise/fatigue and weight loss. Negative for fever.  Respiratory: Positive for cough and shortness of breath. Negative for hemoptysis.        Chronic   Cardiovascular: Negative.  Negative for chest pain and leg swelling.  Gastrointestinal: Negative.  Negative for abdominal pain.  Genitourinary: Negative for flank pain.  Musculoskeletal: Positive for joint pain and myalgias.  Skin: Negative.  Negative for rash.  Neurological: Positive for dizziness, weakness and headaches.  Negative for sensory change.  Psychiatric/Behavioral: Negative.  The patient is not nervous/anxious.     As per HPI. Otherwise, a complete review of systems is negative.  PAST MEDICAL HISTORY: Past Medical History:  Diagnosis Date  . Arthritis   . Blood dyscrasia   . Cancer of left lung ([place]) 08/2015   Rad + chemo tx's.  . Diabetes mellitus without complication ([place])   . Diabetic neuropathy ([place])   . High cholesterol   . Hypertension     PAST SURGICAL HISTORY: Past Surgical History:  Procedure Laterality Date  . ABDOMINAL HYSTERECTOMY    . CESAREAN SECTION    . ELECTROMAGNETIC NAVIGATION BROCHOSCOPY N/A 11/26/2015   Procedure: ELECTROMAGNETIC NAVIGATION BRONCHOSCOPY;  Surgeon: Flora Lipps, MD;  Location: [facility];  Service: Cardiopulmonary;  Laterality: N/A;  . ENDOBRONCHIAL ULTRASOUND N/A 11/26/2015   Procedure: ENDOBRONCHIAL ULTRASOUND;  Surgeon: Flora Lipps, MD;  Location: [facility];  Service: Cardiopulmonary;  Laterality: N/A;  . PERIPHERAL VASCULAR CATHETERIZATION N/A 12/09/2015   Procedure: Glori Luis Cath Insertion;  Surgeon: Katha Cabal, MD;  Location: [facility];  Service: Cardiovascular;  Laterality: N/A;    FAMILY HISTORY: Reviewed and unchanged. No reported history of malignancy or chronic disease.     ADVANCED DIRECTIVES:    HEALTH MAINTENANCE: Social History  Substance Use Topics  . Smoking status: Current Every Day Smoker    Packs/day: 0.30    Years: 35.00    Types: Cigarettes  . Smokeless tobacco: Never Used     Comment: workiing on quitting, down to 2 packs per week now  . Alcohol use No     No Known Allergies  Current Outpatient Prescriptions  Medication Sig Dispense Refill  .  atorvastatin (LIPITOR) 20 MG tablet Take 20 mg by mouth daily at 6 PM.    . gabapentin (NEURONTIN) 300 MG capsule Take 1 capsule (300 mg total) by mouth at bedtime. 30 capsule 0  . insulin glargine (LANTUS) 100 UNIT/ML injection Inject 60 Units into the skin at  bedtime.    . lidocaine-prilocaine (EMLA) cream Apply to affected area once 30 g 6  . linagliptin (TRADJENTA) 5 MG TABS tablet Take 5 mg by mouth daily.     Marland Kitchen lisinopril (PRINIVIL,ZESTRIL) 10 MG tablet Take 10 mg by mouth daily.    Marland Kitchen oxyCODONE (OXY IR/ROXICODONE) 5 MG immediate release tablet Take 1 tablet (5 mg total) by mouth every 6 (six) hours as needed for severe pain. 30 tablet 0  . traMADol (ULTRAM) 50 MG tablet Take 1 tablet (50 mg total) by mouth 2 (two) times daily. 60 tablet 2   No current facility-administered medications for this visit.    Facility-Administered Medications Ordered in Other Visits  Medication Dose Route Frequency Provider Last Rate Last Dose  . sodium chloride flush (NS) 0.9 % injection 10 mL  10 mL Intravenous PRN [name], MD   10 mL at 07/03/17 0915    OBJECTIVE: Vitals:   07/03/17 1004  BP: 134/73  Pulse: 97  Temp: (!) 97.4 F (36.3 C)     Body mass index is 39.01 kg/m.    ECOG FS:1 - Symptomatic but completely ambulatory  General: Well-developed, well-nourished, no acute distress. Eyes: Pink conjunctiva, anicteric sclera. Lungs: Clear to auscultation bilaterally. Heart: Regular rate and rhythm. No rubs, murmurs, or gallops. Abdomen: Soft, nontender, nondistended. No organomegaly noted, normoactive bowel sounds. Musculoskeletal: No edema, cyanosis, or clubbing. Neuro: Alert, answering all questions appropriately. Cranial nerves grossly intact. Skin: No rashes or petechiae noted. Psych: Normal affect.   LAB RESULTS:  Lab Results  Component Value Date   NA 136 07/03/2017   K 4.5 07/03/2017   CL 106 07/03/2017   CO2 25 07/03/2017   GLUCOSE 231 (H) 07/03/2017   BUN 20 07/03/2017   CREATININE 0.66 07/03/2017   CALCIUM 10.0 07/03/2017   PROT 6.6 07/03/2017   ALBUMIN 3.2 (L) 07/03/2017   AST 32 07/03/2017   ALT 40 07/03/2017   ALKPHOS 91 07/03/2017   BILITOT 0.5 07/03/2017   GFRNONAA >60 07/03/2017   GFRAA >60 07/03/2017     Lab Results  Component Value Date   WBC 3.5 (L) 07/03/2017   NEUTROABS 2.2 07/03/2017   HGB 9.9 (L) 07/03/2017   HCT 29.1 (L) 07/03/2017   MCV 86.0 07/03/2017   PLT 125 (L) 07/03/2017   Lab Results  Component Value Date   IRON 63 03/30/2016   TIBC 290 03/30/2016   IRONPCTSAT 22 03/30/2016    Lab Results  Component Value Date   FERRITIN 126 03/30/2016     STUDIES: No results found.  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 She then underwent 2 cycles of consolidation carboplatinum and Taxol completing on Apr 27, 2016.  CT scan results from Apr 18, 2017 reviewed independently with progression of disease. Patient has now completed additional XRT. Proceed with cycle 5 of nivolumab today. Return to clinic in 2 weeks for consideration of cycle 6. Will reimage after cycle 6-8. 2. Headaches/dizziness:  MRI of the brain reviewed independently and reported as above with no obvious metastatic disease. Continue evaluation and treatment per neurosurgery. 3. Anemia: Improving. Hemoglobin 9.9. Continue to monitor.  4. Thrombocytopenia: Mild, monitor. PLT 125 today.  5. Hyperglycemia: Continue diabetic medications as prescribed. 6. Pain: Continue oxycodone as prescribed.  7. Weight loss/decerase in appetite: Dietary consult with Jolie.   Patient expressed understanding and was in agreement with this plan. She also understands that She can call clinic at any time with any questions, concerns, or complaints.    Jacquelin Hawking, NP   07/03/2017 2:10 PM

## 2017-07-04 DIAGNOSIS — G935 Compression of brain: Secondary | ICD-10-CM | POA: Diagnosis not present

## 2017-07-04 LAB — THYROID PANEL WITH TSH
Free Thyroxine Index: 8.4 — ABNORMAL HIGH (ref 1.2–4.9)
T3 UPTAKE RATIO: 45 % — AB (ref 24–39)
T4 TOTAL: 18.7 ug/dL — AB (ref 4.5–12.0)
TSH: 0.01 u[IU]/mL — AB (ref 0.450–4.500)

## 2017-07-13 ENCOUNTER — Ambulatory Visit
Admission: RE | Admit: 2017-07-13 | Discharge: 2017-07-13 | Disposition: A | Discharge status: Home / Self Care | Payer: PPO | Source: Ambulatory Visit | Attending: Radiation Oncology | Admitting: Radiation Oncology

## 2017-07-13 ENCOUNTER — Inpatient Hospital Stay: Payer: PPO | Attending: Oncology

## 2017-07-13 ENCOUNTER — Encounter: Payer: Self-pay | Admitting: Radiation Oncology

## 2017-07-13 VITALS — BP 111/66 | HR 90 | Temp 98.0°F | Wt 240.9 lb

## 2017-07-13 DIAGNOSIS — F17210 Nicotine dependence, cigarettes, uncomplicated: Secondary | ICD-10-CM | POA: Insufficient documentation

## 2017-07-13 DIAGNOSIS — R531 Weakness: Secondary | ICD-10-CM | POA: Insufficient documentation

## 2017-07-13 DIAGNOSIS — Z923 Personal history of irradiation: Secondary | ICD-10-CM | POA: Insufficient documentation

## 2017-07-13 DIAGNOSIS — C3412 Malignant neoplasm of upper lobe, left bronchus or lung: Secondary | ICD-10-CM | POA: Insufficient documentation

## 2017-07-13 DIAGNOSIS — I10 Essential (primary) hypertension: Secondary | ICD-10-CM | POA: Insufficient documentation

## 2017-07-13 DIAGNOSIS — Z794 Long term (current) use of insulin: Secondary | ICD-10-CM | POA: Insufficient documentation

## 2017-07-13 DIAGNOSIS — Z5111 Encounter for antineoplastic chemotherapy: Secondary | ICD-10-CM | POA: Insufficient documentation

## 2017-07-13 DIAGNOSIS — R0602 Shortness of breath: Secondary | ICD-10-CM | POA: Insufficient documentation

## 2017-07-13 DIAGNOSIS — Z9221 Personal history of antineoplastic chemotherapy: Secondary | ICD-10-CM | POA: Insufficient documentation

## 2017-07-13 DIAGNOSIS — Z79899 Other long term (current) drug therapy: Secondary | ICD-10-CM | POA: Insufficient documentation

## 2017-07-13 DIAGNOSIS — E7800 Pure hypercholesterolemia, unspecified: Secondary | ICD-10-CM | POA: Insufficient documentation

## 2017-07-13 DIAGNOSIS — E1140 Type 2 diabetes mellitus with diabetic neuropathy, unspecified: Secondary | ICD-10-CM | POA: Insufficient documentation

## 2017-07-13 DIAGNOSIS — R05 Cough: Secondary | ICD-10-CM | POA: Diagnosis not present

## 2017-07-13 DIAGNOSIS — R42 Dizziness and giddiness: Secondary | ICD-10-CM | POA: Insufficient documentation

## 2017-07-13 DIAGNOSIS — R51 Headache: Secondary | ICD-10-CM | POA: Insufficient documentation

## 2017-07-13 DIAGNOSIS — R5383 Other fatigue: Secondary | ICD-10-CM | POA: Diagnosis not present

## 2017-07-13 DIAGNOSIS — D649 Anemia, unspecified: Secondary | ICD-10-CM | POA: Insufficient documentation

## 2017-07-13 NOTE — Progress Notes (Signed)
Nutrition Assessment   Reason for Assessment:   Referral from NP for poor appetite, weight loss  ASSESSMENT:  66 year old female with stage III SCC of upper left lung currently on nivolumab.  Has completed radiation therapy.  Past medical history of DM, diabetic neuropathy, high cholesterol, HTN, CAD  Patient seen in clinic today with daughter in law [place].  Patient reports decreased appetite for the last 2-3 weeks.  Reports no desire to eat.  "I don't want anything." Reports ate a chicken thigh, some rice and gravy and cornbread yesterday and that was all per patient.  Reports that continuing to smoke takes appetite away as well.  Reports that she loves to cook but does not feel like doing that either.  Does report some taste changes.    No report of nausea, vomiting, diarrhea or constipation.  Does have difficulty chewing due to poor dentition  Nutrition Focused Physical Exam: deferred  Medications: lantus  Labs: glucose 231  Anthropometrics:   Height: 66 inches Weight: 240 lb 13.6 oz Noted 05/08/2017 248 lb 6 oz  BMI: 39  3% weight loss in the last 2 months, not significant  Estimated Energy Needs  Kcals: 2400 calories/d Protein: 89-118 g/d Fluid: 2.4 L/d  NUTRITION DIAGNOSIS: Inadequate food and beverage intake related to poor appetite, smoking as evidenced by 3% weight loss in the last 2 months   MALNUTRITION DIAGNOSIS: continue to monitor   INTERVENTION:   Discussed importance of good nutrition. Encouraged small frequent meals during the day, setting a clock or timer to remind her to eat.  Encouraged good sources of protein at every meal Discussed taste changes and strategies to help. Fact sheet given Discussed oral nutrition supplements and gave patient samples and coupons to try.  Encouraged patient to keep check on blood glucose and call MD if needed.  If intake does not improve and weight continues to decrease may be a candidate for appetite stimulant.       MONITORING, EVALUATION, GOAL: weight trends, intake, labs   NEXT VISIT: to be scheduled during infusion  Elizer Bostic B. Zenia Resides, [place], [place] Registered Dietitian [phone] (pager)

## 2017-07-13 NOTE — Progress Notes (Signed)
Radiation Oncology Follow up Note  Name: Susan Willis   Date:   07/13/2017 MRN:  [id number] DOB: [birth year]    This 66 y.o. 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 She had concurrent chemoradiation 1 year prior for T4 N2 non-small cell lung cancer of the left lung. She had persistent left hilar hypermetabolic activity on PET CT scan. We treated again with concurrent chemoradiation and she is seen today in routine follow-up and is doing fairly well specifically denies hemoptysis. She is having no dysphagia. She has a slight productive cough.she is currently onnivolumab. She feels fatigued and weak. She's currently on oxycodone for pain. She also history of headaches MRI the brain showed no evidence of metastatic disease.  COMPLICATIONS OF TREATMENT: none  FOLLOW UP COMPLIANCE: keeps appointments   PHYSICAL EXAM:  BP 111/66   Pulse 90   Temp 98 F (36.7 C)   Wt 240 lb 13.6 oz (109.2 kg)   BMI 38.87 kg/m  Well-developed obese female in NAD. Well-developed well-nourished patient in NAD. HEENT reveals PERLA, EOMI, discs not visualized.  Oral cavity is clear. No oral mucosal lesions are identified. Neck is clear without evidence of cervical or supraclavicular adenopathy. Lungs are clear to A&P. Cardiac examination is essentially unremarkable with regular rate and rhythm without murmur rub or thrill. Abdomen is benign with no organomegaly or masses noted. Motor sensory and DTR levels are equal and symmetric in the upper and lower extremities. Cranial nerves II through XII are grossly intact. Proprioception is intact. No peripheral adenopathy or edema is identified. No motor or sensory levels are  noted. Crude visual fields are within normal range.  RADIOLOGY RESULTS: no current films for review  PLAN: present time patient is recovering well from her radiation therapy treatments. She continues on immunotherapy under Dr. Gary Fleet direction. I'm otherwise please were overall progress. I will review a follow-up CT scan when it becomes available. I've asked to see her back in 4-5 months for follow-up. Patient knows to call sooner with any concerns.  I would like to take this opportunity to thank you for allowing me to participate in the care of your patient.Armstead Peaks., MD

## 2017-07-16 NOTE — Progress Notes (Signed)
Susan Willis  [phone] Fax:(336) [phone]  ID: Susan Willis OB: Jun 03, 1951  MR#: [id number]  KVQ#:[id number]  Patient Care Team: Herminio Commons, MD as PCP - General (Family Medicine) [name], [name] [name], [name] [name], RN as Oncology Nurse Navigator  CHIEF COMPLAINT: Stage IIIa squamous cell carcinoma of the upper lobe of left lung.  INTERVAL HISTORY: Patient returns to clinic today for further evaluation and consideration of cycle 6 of 8 of  nivolumab. She complains of worsening cough, shortness of breath, fatigue, and right leg weakness since her last treatment. She continues to have headaches and dizziness and was seen by Dr. Aris Lot with neurosurgery who recommended a Chiari malformation decompression. She says that she is having an increasingly difficult time mobilizing and has discussed with PCP but is unsure of next course of action. She denies fevers, illness, chest pain, constipation, diarrhea, nausea, vomiting. She denies urinary and other complaints.    REVIEW OF SYSTEMS:   Review of Systems  Constitutional: Positive for malaise/fatigue. Negative for fever and weight loss.  HENT: Negative for congestion.   Respiratory: Positive for cough. Negative for hemoptysis and shortness of breath.   Cardiovascular: Negative.  Negative for chest pain and leg swelling.  Gastrointestinal: Negative.  Negative for abdominal pain, constipation, diarrhea, nausea and vomiting.  Genitourinary: Negative for flank pain.  Musculoskeletal: Positive for joint pain and myalgias.  Skin: Negative.  Negative for rash.  Neurological: Positive for dizziness, weakness and headaches. Negative for sensory change.  Psychiatric/Behavioral: Negative.  The patient is not nervous/anxious.     As per HPI. Otherwise, a complete review of systems is negative.  PAST MEDICAL  HISTORY: Past Medical History:  Diagnosis Date  . Arthritis   . Blood dyscrasia   . Cancer of left lung ([place]) 08/2015   Rad + chemo tx's.  . Diabetes mellitus without complication ([place])   . Diabetic neuropathy ([place])   . High cholesterol   . Hypertension     PAST SURGICAL HISTORY: Past Surgical History:  Procedure Laterality Date  . ABDOMINAL HYSTERECTOMY    . CESAREAN SECTION    . ELECTROMAGNETIC NAVIGATION BROCHOSCOPY N/A 11/26/2015   Procedure: ELECTROMAGNETIC NAVIGATION BRONCHOSCOPY;  Surgeon: Flora Lipps, MD;  Location: [facility] OR[facility];  Service: Cardiopulmonary;  Laterality: N/A;  . ENDOBRONCHIAL ULTRASOUND N/A 11/26/2015   Procedure: ENDOBRONCHIAL ULTRASOUND;  Surgeon: Flora Lipps, MD;  Location: [facility] OR[facility];  Service: Cardiopulmonary;  Laterality: N/A;  . PERIPHERAL VASCULAR CATHETERIZATION N/A 12/09/2015   Procedure: Glori Luis Cath Insertion;  Surgeon: Katha Cabal, MD;  Location: [place] [facility];  Service: Cardiovascular;  Laterality: N/A;    FAMILY HISTORY: Reviewed and unchanged. No reported history of malignancy or chronic disease.     ADVANCED DIRECTIVES:    HEALTH MAINTENANCE: Social History  Substance Use Topics  . Smoking status: Current Every Day Smoker    Packs/day: 0.30    Years: 35.00    Types: Cigarettes  . Smokeless tobacco: Never Used     Comment: workiing on quitting, down to 2 packs per week now  . Alcohol use No     No Known Allergies  Current Outpatient Prescriptions  Medication Sig Dispense Refill  . atorvastatin (LIPITOR) 20 MG tablet Take 20 mg by mouth daily at 6 PM.    . gabapentin (NEURONTIN) 300 MG capsule Take 1 capsule (300 mg total) by mouth at bedtime. 30 capsule 0  .  lidocaine-prilocaine (EMLA) cream Apply to affected area once 30 g 6  . lisinopril (PRINIVIL,ZESTRIL) 10 MG tablet Take 10 mg by mouth daily.    Marland Kitchen oxyCODONE (OXY IR/ROXICODONE) 5 MG immediate release tablet Take 1 tablet (5 mg total) by mouth every 6 (six) hours as  needed for severe pain. 30 tablet 0  . traMADol (ULTRAM) 50 MG tablet Take 1 tablet (50 mg total) by mouth 2 (two) times daily. 60 tablet 2  . albuterol (PROVENTIL HFA;VENTOLIN HFA) 108 (90 Base) MCG/ACT inhaler Inhale 2 puffs into the lungs every 6 (six) hours as needed for wheezing or shortness of breath. 1 Inhaler 2  . insulin glargine (LANTUS) 100 UNIT/ML injection Inject 60 Units into the skin at bedtime.    Marland Kitchen linagliptin (TRADJENTA) 5 MG TABS tablet Take 5 mg by mouth daily.      No current facility-administered medications for this visit.     OBJECTIVE: Vitals:   07/17/17 1050  BP: 107/71  Pulse: 89  Resp: 18  Temp: (!) 97.5 F (36.4 C)  SpO2: 97%     Body mass index is 39.08 kg/m.    ECOG FS:1 - Symptomatic but completely ambulatory  General: Well-developed, well-nourished, no acute distress. Eyes: Pink conjunctiva, anicteric sclera. Lungs: Clear to auscultation bilaterally. Heart: Regular rate and rhythm. No rubs, murmurs, or gallops. Abdomen: Soft, nontender, nondistended. No organomegaly noted, normoactive bowel sounds. Musculoskeletal: No edema, cyanosis, or clubbing. Neuro: Alert, answering all questions appropriately. Cranial nerves grossly intact. Skin: No rashes or petechiae noted. Psych: Normal affect.   LAB RESULTS:  Lab Results  Component Value Date   NA 137 07/17/2017   K 4.6 07/17/2017   CL 107 07/17/2017   CO2 23 07/17/2017   GLUCOSE 266 (H) 07/17/2017   BUN 17 07/17/2017   CREATININE 0.72 07/17/2017   CALCIUM 9.4 07/17/2017   PROT 6.6 07/17/2017   ALBUMIN 3.2 (L) 07/17/2017   AST 35 07/17/2017   ALT 31 07/17/2017   ALKPHOS 76 07/17/2017   BILITOT 0.5 07/17/2017   GFRNONAA >60 07/17/2017   GFRAA >60 07/17/2017    Lab Results  Component Value Date   WBC 3.1 (L) 07/17/2017   NEUTROABS 1.8 07/17/2017   HGB 9.6 (L) 07/17/2017   HCT 28.4 (L) 07/17/2017   MCV 85.2 07/17/2017   PLT 133 (L) 07/17/2017   Lab Results  Component Value Date    IRON 63 03/30/2016   TIBC 290 03/30/2016   IRONPCTSAT 22 03/30/2016    Lab Results  Component Value Date   FERRITIN 126 03/30/2016     STUDIES: No results found.  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 She then underwent 2 cycles of consolidation carboplatinum and Taxol completing on Apr 27, 2016.  CT scan results from Apr 18, 2017 reviewed independently with progression of disease. Patient has now completed additional XRT. Proceed with cycle 6 of nivolumab today. Return to clinic in 2 weeks for consideration of cycle 7. Will reimage after cycle 8. Will refill EMLA cream for port access. Will f/u with RT 12/2017.  2. Headaches/dizziness: MRI of the brain reviewed independently and reported as above with no obvious metastatic disease. Patient has seen Dr. Aris Lot with neurosurgery. Will discuss patient with him to clear for Neurosurgery intervention.  3. Anemia: Hmg today 9.6 which is slightly lower than previous levels. Will check iron studies on next blood draw in 2 weeks. Previously iron stores were within normal limits.  4. Thrombocytopenia: Mild, monitor. 5. Hyperglycemia: Continue diabetic medications as prescribed. 6. Pain: Continue oxycodone as prescribed and will re-fill gabapentin today. 7. Shortness of breath: Complains of worsening cough and shortness of breath. Will do chest x-ray today and start albuterol inhaler.   Patient expressed understanding and was in agreement with this plan. She also understands that She can call clinic at any time with any questions, concerns, or complaints.   Ronique Simerly G. Zenia Resides, NP 07/17/17, 12:03PM  Patient was seen and evaluated independently and I agree with the assessment and plan as dictated above.  [name], MD 07/17/17 4:50  PM

## 2017-07-17 ENCOUNTER — Inpatient Hospital Stay (HOSPITAL_BASED_OUTPATIENT_CLINIC_OR_DEPARTMENT_OTHER): Payer: PPO | Admitting: Oncology

## 2017-07-17 ENCOUNTER — Inpatient Hospital Stay: Payer: PPO

## 2017-07-17 VITALS — BP 107/71 | HR 89 | Temp 97.5°F | Resp 18 | Wt 242.1 lb

## 2017-07-17 DIAGNOSIS — E1140 Type 2 diabetes mellitus with diabetic neuropathy, unspecified: Secondary | ICD-10-CM | POA: Diagnosis not present

## 2017-07-17 DIAGNOSIS — Z5111 Encounter for antineoplastic chemotherapy: Secondary | ICD-10-CM | POA: Diagnosis not present

## 2017-07-17 DIAGNOSIS — G620 Drug-induced polyneuropathy: Secondary | ICD-10-CM

## 2017-07-17 DIAGNOSIS — R42 Dizziness and giddiness: Secondary | ICD-10-CM | POA: Diagnosis not present

## 2017-07-17 DIAGNOSIS — Z79899 Other long term (current) drug therapy: Secondary | ICD-10-CM | POA: Diagnosis not present

## 2017-07-17 DIAGNOSIS — C3412 Malignant neoplasm of upper lobe, left bronchus or lung: Secondary | ICD-10-CM

## 2017-07-17 DIAGNOSIS — Z794 Long term (current) use of insulin: Secondary | ICD-10-CM | POA: Diagnosis not present

## 2017-07-17 DIAGNOSIS — E7800 Pure hypercholesterolemia, unspecified: Secondary | ICD-10-CM | POA: Diagnosis not present

## 2017-07-17 DIAGNOSIS — R0602 Shortness of breath: Secondary | ICD-10-CM | POA: Diagnosis not present

## 2017-07-17 DIAGNOSIS — T451X5A Adverse effect of antineoplastic and immunosuppressive drugs, initial encounter: Secondary | ICD-10-CM

## 2017-07-17 DIAGNOSIS — F17210 Nicotine dependence, cigarettes, uncomplicated: Secondary | ICD-10-CM

## 2017-07-17 DIAGNOSIS — R51 Headache: Secondary | ICD-10-CM | POA: Diagnosis not present

## 2017-07-17 DIAGNOSIS — Z923 Personal history of irradiation: Secondary | ICD-10-CM

## 2017-07-17 DIAGNOSIS — I10 Essential (primary) hypertension: Secondary | ICD-10-CM | POA: Diagnosis not present

## 2017-07-17 DIAGNOSIS — R439 Unspecified disturbances of smell and taste: Secondary | ICD-10-CM

## 2017-07-17 DIAGNOSIS — D649 Anemia, unspecified: Secondary | ICD-10-CM | POA: Diagnosis not present

## 2017-07-17 DIAGNOSIS — R05 Cough: Secondary | ICD-10-CM

## 2017-07-17 LAB — CBC WITH DIFFERENTIAL/PLATELET
Basophils Absolute: 0 10*3/uL (ref 0–0.1)
Basophils Relative: 0 %
EOS ABS: 0.1 10*3/uL (ref 0–0.7)
EOS PCT: 4 %
HCT: 28.4 % — ABNORMAL LOW (ref 35.0–47.0)
Hemoglobin: 9.6 g/dL — ABNORMAL LOW (ref 12.0–16.0)
LYMPHS ABS: 0.8 10*3/uL — AB (ref 1.0–3.6)
LYMPHS PCT: 24 %
MCH: 28.9 pg (ref 26.0–34.0)
MCHC: 33.9 g/dL (ref 32.0–36.0)
MCV: 85.2 fL (ref 80.0–100.0)
MONOS PCT: 14 %
Monocytes Absolute: 0.4 10*3/uL (ref 0.2–0.9)
NEUTROS ABS: 1.8 10*3/uL (ref 1.4–6.5)
Neutrophils Relative %: 58 %
PLATELETS: 133 10*3/uL — AB (ref 150–440)
RBC: 3.34 MIL/uL — AB (ref 3.80–5.20)
RDW: 13.8 % (ref 11.5–14.5)
WBC: 3.1 10*3/uL — ABNORMAL LOW (ref 3.6–11.0)

## 2017-07-17 LAB — COMPREHENSIVE METABOLIC PANEL
ALT: 31 U/L (ref 14–54)
ANION GAP: 7 (ref 5–15)
AST: 35 U/L (ref 15–41)
Albumin: 3.2 g/dL — ABNORMAL LOW (ref 3.5–5.0)
Alkaline Phosphatase: 76 U/L (ref 38–126)
BUN: 17 mg/dL (ref 6–20)
CHLORIDE: 107 mmol/L (ref 101–111)
CO2: 23 mmol/L (ref 22–32)
Calcium: 9.4 mg/dL (ref 8.9–10.3)
Creatinine, Ser: 0.72 mg/dL (ref 0.44–1.00)
GFR calc non Af Amer: >60 mL/min (ref >60)
Glucose, Bld: 266 mg/dL — ABNORMAL HIGH (ref 65–99)
Potassium: 4.6 mmol/L (ref 3.5–5.1)
SODIUM: 137 mmol/L (ref 135–145)
Total Bilirubin: 0.5 mg/dL (ref 0.3–1.2)
Total Protein: 6.6 g/dL (ref 6.5–8.1)

## 2017-07-17 MED ORDER — LIDOCAINE-PRILOCAINE 2.5-2.5 % EX CREA: TOPICAL_CREAM | CUTANEOUS | 6 refills | Status: DC

## 2017-07-17 MED ORDER — NIVOLUMAB CHEMO INJECTION 100 MG/10ML
240.0000 mg | Freq: Once | INTRAVENOUS | Status: AC
  Administered 2017-07-17: 240 mg via INTRAVENOUS
  Filled 2017-07-17: qty 24

## 2017-07-17 MED ORDER — HEPARIN SOD (PORK) LOCK FLUSH 100 UNIT/ML IV SOLN
500.0000 [IU] | Freq: Once | INTRAVENOUS | Status: AC | PRN
  Administered 2017-07-17: 500 [IU]
  Filled 2017-07-17: qty 5

## 2017-07-17 MED ORDER — GABAPENTIN 300 MG PO CAPS: 300.0000 mg | ORAL_CAPSULE | Freq: Every day | ORAL | 0 refills | Status: DC

## 2017-07-17 MED ORDER — SODIUM CHLORIDE 0.9 % IV SOLN
Freq: Once | INTRAVENOUS | Status: AC
  Administered 2017-07-17: 12:00:00 via INTRAVENOUS
  Filled 2017-07-17: qty 1000

## 2017-07-17 MED ORDER — ALBUTEROL SULFATE HFA 108 (90 BASE) MCG/ACT IN AERS: 2.0000 | INHALATION_SPRAY | Freq: Four times a day (QID) | RESPIRATORY_TRACT | 2 refills | Status: DC | PRN

## 2017-07-18 LAB — THYROID PANEL WITH TSH
Free Thyroxine Index: 4.8 (ref 1.2–4.9)
T3 UPTAKE RATIO: 37 % (ref 24–39)
T4 TOTAL: 13 ug/dL — AB (ref 4.5–12.0)
TSH: <0.006 u[IU]/mL — ABNORMAL LOW (ref 0.450–4.500)

## 2017-07-30 NOTE — Progress Notes (Signed)
[facility]  Telephone:(336) [phone] Fax:(336) [phone]  ID: Susan Willis OB: Feb 06, 1951  MR#: [id number]  YKD#:[id number]  Patient Care Team: Herminio Commons, MD as PCP - General (Family Medicine) [name], [name] [name], [name] [name], RN as Oncology Nurse Navigator  CHIEF COMPLAINT: Stage IIIa squamous cell carcinoma of the upper lobe of left lung.  INTERVAL HISTORY: Patient returns to clinic today for further evaluation and consideration of cycle 7 of nivolumab. She continues to have mild weakness and fatigue, but otherwise feels well. She has no other neurologic complaints. She does not complain of peripheral neuropathy today. She denies any recent fevers or illnesses. She denies any chest pain, cough, hemoptysis, or shortness of breath. She denies constipation, diarrhea, nausea, or vomiting.  She has no urinary complaints. Patient offers no further specific complaints today.  REVIEW OF SYSTEMS:   Review of Systems  Constitutional: Positive for malaise/fatigue. Negative for fever and weight loss.  Respiratory: Negative for cough, hemoptysis and shortness of breath.   Cardiovascular: Negative.  Negative for chest pain and leg swelling.  Gastrointestinal: Negative.  Negative for abdominal pain.  Genitourinary: Negative for flank pain.  Musculoskeletal: Positive for joint pain. Negative for myalgias.  Skin: Negative.  Negative for rash.  Neurological: Positive for dizziness, weakness and headaches. Negative for sensory change.  Psychiatric/Behavioral: Negative.  The patient is not nervous/anxious.     As per HPI. Otherwise, a complete review of systems is negative.  PAST MEDICAL HISTORY: Past Medical History:  Diagnosis Date  . Arthritis   . Blood dyscrasia   . Cancer of left lung ([place]) 08/2015   Rad + chemo tx's.  . Diabetes mellitus without complication  ([place])   . Diabetic neuropathy ([place])   . High cholesterol   . Hypertension     PAST SURGICAL HISTORY: Past Surgical History:  Procedure Laterality Date  . ABDOMINAL HYSTERECTOMY    . CESAREAN SECTION    . ELECTROMAGNETIC NAVIGATION BROCHOSCOPY N/A 11/26/2015   Procedure: ELECTROMAGNETIC NAVIGATION BRONCHOSCOPY;  Surgeon: Flora Lipps, MD;  Location: [facility] OR[facility];  Service: Cardiopulmonary;  Laterality: N/A;  . ENDOBRONCHIAL ULTRASOUND N/A 11/26/2015   Procedure: ENDOBRONCHIAL ULTRASOUND;  Surgeon: Flora Lipps, MD;  Location: [facility];  Service: Cardiopulmonary;  Laterality: N/A;  . PERIPHERAL VASCULAR CATHETERIZATION N/A 12/09/2015   Procedure: Glori Luis Cath Insertion;  Surgeon: Katha Cabal, MD;  Location: [place] [facility];  Service: Cardiovascular;  Laterality: N/A;    FAMILY HISTORY: Reviewed and unchanged. No reported history of malignancy or chronic disease.     ADVANCED DIRECTIVES:    HEALTH MAINTENANCE: Social History  Substance Use Topics  . Smoking status: Current Every Day Smoker    Packs/day: 0.30    Years: 35.00    Types: Cigarettes  . Smokeless tobacco: Never Used     Comment: workiing on quitting, down to 2 packs per week now  . Alcohol use No     No Known Allergies  Current Outpatient Prescriptions  Medication Sig Dispense Refill  . albuterol (PROVENTIL HFA;VENTOLIN HFA) 108 (90 Base) MCG/ACT inhaler Inhale 2 puffs into the lungs every 6 (six) hours as needed for wheezing or shortness of breath. 1 Inhaler 2  . atorvastatin (LIPITOR) 20 MG tablet Take 20 mg by mouth daily at 6 PM.    . gabapentin (NEURONTIN) 300 MG capsule Take 1 capsule (300 mg total) by mouth at bedtime. 30 capsule 0  .  insulin glargine (LANTUS) 100 UNIT/ML injection Inject 60 Units into the skin at bedtime.    . lidocaine-prilocaine (EMLA) cream Apply to affected area once 30 g 6  . linagliptin (TRADJENTA) 5 MG TABS tablet Take 5 mg by mouth daily.     Marland Kitchen lisinopril (PRINIVIL,ZESTRIL) 10  MG tablet Take 10 mg by mouth daily.    Marland Kitchen oxyCODONE (OXY IR/ROXICODONE) 5 MG immediate release tablet Take 1 tablet (5 mg total) by mouth every 6 (six) hours as needed for severe pain. 30 tablet 0  . traMADol (ULTRAM) 50 MG tablet Take 1 tablet (50 mg total) by mouth 2 (two) times daily. 60 tablet 2   No current facility-administered medications for this visit.    Facility-Administered Medications Ordered in Other Visits  Medication Dose Route Frequency Provider Last Rate Last Dose  . heparin lock flush 100 unit/mL  500 Units Intravenous Once [name], MD      . sodium chloride flush (NS) 0.9 % injection 10 mL  10 mL Intravenous PRN [name], MD   10 mL at 07/31/17 0916    OBJECTIVE: Vitals:   07/31/17 0924  BP: 113/65  Pulse: 81  Resp: 18  Temp: (!) 97.5 F (36.4 C)  SpO2: 98%     Body mass index is 39.21 kg/m.    ECOG FS:1 - Symptomatic but completely ambulatory  General: Well-developed, well-nourished, no acute distress. Eyes: Pink conjunctiva, anicteric sclera. Lungs: Clear to auscultation bilaterally. Heart: Regular rate and rhythm. No rubs, murmurs, or gallops. Abdomen: Soft, nontender, nondistended. No organomegaly noted, normoactive bowel sounds. Musculoskeletal: No edema, cyanosis, or clubbing. Neuro: Alert, answering all questions appropriately. Cranial nerves grossly intact. Skin: No rashes or petechiae noted. Psych: Normal affect.   LAB RESULTS:  Lab Results  Component Value Date   NA 136 07/31/2017   K 4.2 07/31/2017   CL 105 07/31/2017   CO2 25 07/31/2017   GLUCOSE 232 (H) 07/31/2017   BUN 12 07/31/2017   CREATININE 0.75 07/31/2017   CALCIUM 9.1 07/31/2017   PROT 6.6 07/31/2017   ALBUMIN 3.3 (L) 07/31/2017   AST 24 07/31/2017   ALT 22 07/31/2017   ALKPHOS 75 07/31/2017   BILITOT 0.4 07/31/2017   GFRNONAA >60 07/31/2017   GFRAA >60 07/31/2017    Lab Results  Component Value Date   WBC 3.6 07/31/2017   NEUTROABS 2.4  07/31/2017   HGB 10.2 (L) 07/31/2017   HCT 29.6 (L) 07/31/2017   MCV 84.6 07/31/2017   PLT 140 (L) 07/31/2017   Lab Results  Component Value Date   IRON 63 03/30/2016   TIBC 290 03/30/2016   IRONPCTSAT 22 03/30/2016    Lab Results  Component Value Date   FERRITIN 126 03/30/2016     STUDIES: No results found.  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 She then underwent 2 cycles of consolidation carboplatinum and Taxol completing on Apr 27, 2016.  CT scan results from Apr 18, 2017 reviewed independently with progression of disease. Patient has now completed additional XRT. Proceed with cycle 7 of nivolumab today. Return to clinic in 2 weeks for consideration of cycle 8. Will reimage prior to next appointment. 2. Headaches/dizziness: MRI of the brain reviewed independently and reported as above with no  obvious metastatic disease. 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 Okay to proceed from an oncology standpoint. 3. Anemia: Improving. Previously iron stores were within normal limits, monitor.  4. Thrombocytopenia: Mild, monitor. 5. Hyperglycemia: Continue diabetic medications as prescribed. 6. Pain: Patient does not complain of this today. Continue oxycodone as prescribed. 7. Thyroid: Patient's TSH is trending down with a mildly elevated T4. Today's results are pending. Consider endocrinology referral.  Patient expressed understanding and was in agreement with this plan. She also understands that She can call clinic at any time with any questions, concerns, or complaints.    [name], MD   07/31/2017 9:45 AM

## 2017-07-31 ENCOUNTER — Inpatient Hospital Stay: Payer: PPO

## 2017-07-31 ENCOUNTER — Inpatient Hospital Stay (HOSPITAL_BASED_OUTPATIENT_CLINIC_OR_DEPARTMENT_OTHER): Payer: PPO | Admitting: Oncology

## 2017-07-31 VITALS — BP 113/65 | HR 81 | Temp 97.5°F | Resp 18 | Wt 242.9 lb

## 2017-07-31 DIAGNOSIS — R42 Dizziness and giddiness: Secondary | ICD-10-CM

## 2017-07-31 DIAGNOSIS — Z923 Personal history of irradiation: Secondary | ICD-10-CM | POA: Diagnosis not present

## 2017-07-31 DIAGNOSIS — R05 Cough: Secondary | ICD-10-CM | POA: Diagnosis not present

## 2017-07-31 DIAGNOSIS — D649 Anemia, unspecified: Secondary | ICD-10-CM

## 2017-07-31 DIAGNOSIS — Z79899 Other long term (current) drug therapy: Secondary | ICD-10-CM | POA: Diagnosis not present

## 2017-07-31 DIAGNOSIS — R0602 Shortness of breath: Secondary | ICD-10-CM | POA: Diagnosis not present

## 2017-07-31 DIAGNOSIS — F17210 Nicotine dependence, cigarettes, uncomplicated: Secondary | ICD-10-CM

## 2017-07-31 DIAGNOSIS — Z5111 Encounter for antineoplastic chemotherapy: Secondary | ICD-10-CM | POA: Diagnosis not present

## 2017-07-31 DIAGNOSIS — C3412 Malignant neoplasm of upper lobe, left bronchus or lung: Secondary | ICD-10-CM

## 2017-07-31 DIAGNOSIS — R51 Headache: Secondary | ICD-10-CM | POA: Diagnosis not present

## 2017-07-31 LAB — COMPREHENSIVE METABOLIC PANEL
ALK PHOS: 75 U/L (ref 38–126)
ALT: 22 U/L (ref 14–54)
ANION GAP: 6 (ref 5–15)
AST: 24 U/L (ref 15–41)
Albumin: 3.3 g/dL — ABNORMAL LOW (ref 3.5–5.0)
BILIRUBIN TOTAL: 0.4 mg/dL (ref 0.3–1.2)
BUN: 12 mg/dL (ref 6–20)
CALCIUM: 9.1 mg/dL (ref 8.9–10.3)
CO2: 25 mmol/L (ref 22–32)
CREATININE: 0.75 mg/dL (ref 0.44–1.00)
Chloride: 105 mmol/L (ref 101–111)
Glucose, Bld: 232 mg/dL — ABNORMAL HIGH (ref 65–99)
Potassium: 4.2 mmol/L (ref 3.5–5.1)
SODIUM: 136 mmol/L (ref 135–145)
TOTAL PROTEIN: 6.6 g/dL (ref 6.5–8.1)

## 2017-07-31 LAB — CBC WITH DIFFERENTIAL/PLATELET
Basophils Absolute: 0 10*3/uL (ref 0–0.1)
Basophils Relative: 0 %
EOS ABS: 0.1 10*3/uL (ref 0–0.7)
Eosinophils Relative: 4 %
HCT: 29.6 % — ABNORMAL LOW (ref 35.0–47.0)
HEMOGLOBIN: 10.2 g/dL — AB (ref 12.0–16.0)
LYMPHS ABS: 0.7 10*3/uL — AB (ref 1.0–3.6)
LYMPHS PCT: 19 %
MCH: 29.1 pg (ref 26.0–34.0)
MCHC: 34.3 g/dL (ref 32.0–36.0)
MCV: 84.6 fL (ref 80.0–100.0)
MONOS PCT: 10 %
Monocytes Absolute: 0.4 10*3/uL (ref 0.2–0.9)
NEUTROS PCT: 67 %
Neutro Abs: 2.4 10*3/uL (ref 1.4–6.5)
Platelets: 140 10*3/uL — ABNORMAL LOW (ref 150–440)
RBC: 3.49 MIL/uL — AB (ref 3.80–5.20)
RDW: 14 % (ref 11.5–14.5)
WBC: 3.6 10*3/uL (ref 3.6–11.0)

## 2017-07-31 LAB — FERRITIN: Ferritin: 81 ng/mL (ref 11–307)

## 2017-07-31 LAB — IRON AND TIBC
Iron: 42 ug/dL (ref 28–170)
SATURATION RATIOS: 15 % (ref 10.4–31.8)
TIBC: 280 ug/dL (ref 250–450)
UIBC: 238 ug/dL

## 2017-07-31 MED ORDER — SODIUM CHLORIDE 0.9 % IV SOLN
Freq: Once | INTRAVENOUS | Status: AC
  Administered 2017-07-31: 10:00:00 via INTRAVENOUS
  Filled 2017-07-31: qty 1000

## 2017-07-31 MED ORDER — HEPARIN SOD (PORK) LOCK FLUSH 100 UNIT/ML IV SOLN: 500.0000 [IU] | Freq: Once | INTRAVENOUS | Status: DC | PRN

## 2017-07-31 MED ORDER — SODIUM CHLORIDE 0.9% FLUSH
10.0000 mL | INTRAVENOUS | Status: DC | PRN
  Administered 2017-07-31: 10 mL via INTRAVENOUS
  Filled 2017-07-31: qty 10

## 2017-07-31 MED ORDER — SODIUM CHLORIDE 0.9 % IV SOLN
240.0000 mg | Freq: Once | INTRAVENOUS | Status: AC
  Administered 2017-07-31: 240 mg via INTRAVENOUS
  Filled 2017-07-31: qty 24

## 2017-07-31 MED ORDER — HEPARIN SOD (PORK) LOCK FLUSH 100 UNIT/ML IV SOLN
500.0000 [IU] | Freq: Once | INTRAVENOUS | Status: AC
Start: 2017-07-31 — End: 2017-07-31
  Administered 2017-07-31: 500 [IU] via INTRAVENOUS
  Filled 2017-07-31: qty 5

## 2017-07-31 NOTE — Progress Notes (Signed)
Nutrition Follow-up:  Nutrition follow-up completed in infusion this am.  Patient with left lung cancer.    Patient reports appetite has improved some since last seen.  Patient reports has been drinking ensure shakes (usually 1 per day). Reports yesterday drank shake for breakfast, did not eat lunch and ate cabbage, barbecue chicken, cornbread for dinner last night.  Reports taste is little better.    Some nausea reported but managed by medications.  No change in bowel habits   Medications: reviewed  Labs: glucose 232  Anthropometrics:   Weight increased to 242 lb 14.4 oz today, increased from 240 lb 13. 6 oz on 8/9.    NUTRITION DIAGNOSIS: Inadequate food and beverage intake improving   MALNUTRITION DIAGNOSIS: continue to monitor   INTERVENTION:   Encouraged patient to continue to consume good sources of protein at every meal.  Encouraged patient not to skip meals.   Encouraged patient to continue to drink oral nutrition supplements for added nutrition Patient to monitor blood glucose level and call MD if needed for medication adjustment    MONITORING, EVALUATION, GOAL: weight trends, intake, labs   NEXT VISIT: Sept 10th during infusion  Susan Willis B. Zenia Resides, [place], [place] Registered Dietitian [phone] (pager)

## 2017-08-01 LAB — THYROID PANEL WITH TSH
FREE THYROXINE INDEX: 2 (ref 1.2–4.9)
T3 UPTAKE RATIO: 27 % (ref 24–39)
T4 TOTAL: 7.3 ug/dL (ref 4.5–12.0)
TSH: 0.076 u[IU]/mL — AB (ref 0.450–4.500)

## 2017-08-11 ENCOUNTER — Telehealth: Payer: Self-pay | Admitting: *Deleted

## 2017-08-11 NOTE — Telephone Encounter (Signed)
Dr Gwynneth Aliment office is closed for the day and I will have to call back on Monday. Patient as an appointment with D rFinn on Monday and I will try to get Dr Gwynneth Aliment Monday mornig to give her apt while she is here

## 2017-08-11 NOTE — Telephone Encounter (Signed)
PET dept called to report that they were unable to perform PET due to patient glu being up to 268. Patient had called earlier this morning to report that BS was 220 did not eat and actually went for a walk to try to get it down, but it went up, so they told her at 11 AM when it was 268 not to come in for exam. It has not been rescheduled at this time as the patient reports she has been  Having trouble with her sugars for a week or so now. States she may need medicine adjustments. Please advise

## 2017-08-11 NOTE — Telephone Encounter (Signed)
Diabetes is being managed by her pcp.  Please make them aware to help adjust her meds appropriately.

## 2017-08-11 NOTE — Progress Notes (Signed)
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  INTERVAL HISTORY: Patient returns to clinic today for further evaluation and consideration of cycle 8 of nivolumab. Her PET scan for repeat staging purposes was postponed secondary to hyperglycemia. She continues to have mild weakness and fatigue, but otherwise feels well. She has no new neurologic complaints. She does not complain of peripheral neuropathy today. She denies any recent fevers or illnesses. She denies any chest pain, cough, hemoptysis, or shortness of breath. She denies nausea, constipation, diarrhea, nausea, or vomiting.  She has no urinary complaints. Patient offers no further specific complaints today.  REVIEW OF SYSTEMS:   Review of Systems  Constitutional: Positive for malaise/fatigue. Negative for fever and weight loss.  Respiratory: Negative for cough, hemoptysis and shortness of breath.   Cardiovascular: Negative.  Negative for chest pain and leg swelling.  Gastrointestinal: Negative.  Negative for abdominal pain.  Genitourinary: Negative for flank pain.  Musculoskeletal: Positive for joint pain. Negative for myalgias.  Skin: Negative.  Negative for rash.  Neurological: Positive for dizziness, weakness and headaches. Negative for sensory change.  Psychiatric/Behavioral: Negative.  The patient is not nervous/anxious.     As per HPI. Otherwise, a complete review of systems is negative.  PAST MEDICAL HISTORY: Past Medical History:  Diagnosis Date  . Arthritis   . Blood dyscrasia   . Cancer of  left lung ([place]) 08/2015   Rad + chemo tx's.  . Diabetes mellitus without complication ([place])   . Diabetic neuropathy ([place])   . High cholesterol   . Hypertension     PAST SURGICAL HISTORY: Past Surgical History:  Procedure Laterality Date  . ABDOMINAL HYSTERECTOMY    . CESAREAN SECTION    . ELECTROMAGNETIC NAVIGATION BROCHOSCOPY N/A 11/26/2015   Procedure: ELECTROMAGNETIC NAVIGATION BRONCHOSCOPY;  Surgeon: Flora Lipps, MD;  Location: [facility];  Service: Cardiopulmonary;  Laterality: N/A;  . ENDOBRONCHIAL ULTRASOUND N/A 11/26/2015   Procedure: ENDOBRONCHIAL ULTRASOUND;  Surgeon: Flora Lipps, MD;  Location: [facility];  Service: Cardiopulmonary;  Laterality: N/A;  . PERIPHERAL VASCULAR CATHETERIZATION N/A 12/09/2015   Procedure: Glori Luis Cath Insertion;  Surgeon: Katha Cabal, MD;  Location: [facility];  Service: Cardiovascular;  Laterality: N/A;    FAMILY HISTORY: Reviewed and unchanged. No reported history of malignancy or chronic disease.     ADVANCED DIRECTIVES:    HEALTH MAINTENANCE: Social History  Substance Use Topics  . Smoking status: Current Every Day Smoker    Packs/day: 0.30    Years: 35.00    Types: Cigarettes  . Smokeless tobacco: Never Used     Comment: workiing on quitting, down to 2 packs per week now  . Alcohol use No     No Known Allergies  Current Outpatient Prescriptions  Medication Sig Dispense Refill  . albuterol (PROVENTIL HFA;VENTOLIN HFA) 108 (90 Base) MCG/ACT inhaler Inhale 2 puffs into the lungs every 6 (six) hours as needed for wheezing or shortness of breath. 1 Inhaler 2  . atorvastatin (LIPITOR) 20 MG tablet Take 20 mg by mouth daily at 6 PM.    . gabapentin (NEURONTIN) 300 MG capsule Take  1 capsule (300 mg total) by mouth at bedtime. 30 capsule 0  . insulin glargine (LANTUS) 100 UNIT/ML injection Inject 60 Units into the skin at bedtime.    . lidocaine-prilocaine (EMLA) cream Apply to affected area once 30 g 6  . linagliptin  (TRADJENTA) 5 MG TABS tablet Take 5 mg by mouth daily.     Marland Kitchen lisinopril (PRINIVIL,ZESTRIL) 10 MG tablet Take 10 mg by mouth daily.    Marland Kitchen oxyCODONE (OXY IR/ROXICODONE) 5 MG immediate release tablet Take 1 tablet (5 mg total) by mouth every 6 (six) hours as needed for severe pain. 30 tablet 0  . traMADol (ULTRAM) 50 MG tablet Take 1 tablet (50 mg total) by mouth 2 (two) times daily. 60 tablet 2   No current facility-administered medications for this visit.    Facility-Administered Medications Ordered in Other Visits  Medication Dose Route Frequency Provider Last Rate Last Dose  . heparin lock flush 100 unit/mL  500 Units Intravenous Once [name], MD        OBJECTIVE: Vitals:   08/14/17 0909  BP: 130/60  Pulse: 77  Resp: 20  Temp: (!) 96.8 F (36 C)     Body mass index is 40.5 kg/m.    ECOG FS:1 - Symptomatic but completely ambulatory  General: Well-developed, well-nourished, no acute distress. Eyes: Pink conjunctiva, anicteric sclera. Lungs: Clear to auscultation bilaterally. Heart: Regular rate and rhythm. No rubs, murmurs, or gallops. Abdomen: Soft, nontender, nondistended. No organomegaly noted, normoactive bowel sounds. Musculoskeletal: No edema, cyanosis, or clubbing. Neuro: Alert, answering all questions appropriately. Cranial nerves grossly intact. Skin: No rashes or petechiae noted. Psych: Normal affect.   LAB RESULTS:  Lab Results  Component Value Date   NA 136 08/14/2017   K 3.9 08/14/2017   CL 106 08/14/2017   CO2 25 08/14/2017   GLUCOSE 250 (H) 08/14/2017   BUN 13 08/14/2017   CREATININE 0.68 08/14/2017   CALCIUM 9.1 08/14/2017   PROT 6.6 08/14/2017   ALBUMIN 3.3 (L) 08/14/2017   AST 20 08/14/2017   ALT 17 08/14/2017   ALKPHOS 70 08/14/2017   BILITOT 0.6 08/14/2017   GFRNONAA >60 08/14/2017   GFRAA >60 08/14/2017    Lab Results  Component Value Date   WBC 4.2 08/14/2017   NEUTROABS 2.8 08/14/2017   HGB 10.3 (L) 08/14/2017   HCT 30.1  (L) 08/14/2017   MCV 85.0 08/14/2017   PLT 149 (L) 08/14/2017   Lab Results  Component Value Date   IRON 42 07/31/2017   TIBC 280 07/31/2017   IRONPCTSAT 15 07/31/2017    Lab Results  Component Value Date   FERRITIN 81 07/31/2017     STUDIES: No results found.  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 She then underwent 2 cycles of consolidation carboplatinum and Taxol completing on Apr 27, 2016.  CT scan results from Apr 18, 2017 reviewed independently with progression of disease. Patient has now completed additional XRT. Proceed with cycle 8 of nivolumab today. Return to clinic in 2 weeks for consideration of cycle 9. Will attempt to reschedule PET scan prior to next treatment provided her blood glucose is under control.  2. Headaches/dizziness: MRI of the brain reviewed independently and reported as above with no obvious metastatic disease. 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 Okay to proceed from an oncology standpoint. 3. Anemia: Improving. Previously iron stores were within normal limits, monitor.  4. Thrombocytopenia: Mild, monitor. 5. Hyperglycemia: Continue diabetic medications as prescribed. Patient has been instructed to follow-up with her primary care physician to help improve her glycemic control. 6. Pain: Patient does not complain of this today. Continue oxycodone as prescribed. 7. Thyroid: Patient's TSH is trending down with a mildly elevated T4. Today's results are pending. Consider endocrinology referral.  Patient expressed understanding and was in agreement with this plan. She also understands that She can call clinic at any time with any questions, concerns, or complaints.    [name], MD   08/14/2017 10:04 AM

## 2017-08-14 ENCOUNTER — Inpatient Hospital Stay: Payer: PPO

## 2017-08-14 ENCOUNTER — Inpatient Hospital Stay: Payer: PPO | Attending: Oncology | Admitting: Oncology

## 2017-08-14 ENCOUNTER — Other Ambulatory Visit: Payer: Self-pay | Admitting: *Deleted

## 2017-08-14 VITALS — BP 132/75 | HR 78 | Resp 20

## 2017-08-14 VITALS — BP 130/60 | HR 77 | Temp 96.8°F | Resp 20 | Wt 250.9 lb

## 2017-08-14 DIAGNOSIS — R51 Headache: Secondary | ICD-10-CM

## 2017-08-14 DIAGNOSIS — R531 Weakness: Secondary | ICD-10-CM | POA: Diagnosis not present

## 2017-08-14 DIAGNOSIS — R5383 Other fatigue: Secondary | ICD-10-CM

## 2017-08-14 DIAGNOSIS — C3412 Malignant neoplasm of upper lobe, left bronchus or lung: Secondary | ICD-10-CM

## 2017-08-14 DIAGNOSIS — M129 Arthropathy, unspecified: Secondary | ICD-10-CM | POA: Diagnosis not present

## 2017-08-14 DIAGNOSIS — E7800 Pure hypercholesterolemia, unspecified: Secondary | ICD-10-CM

## 2017-08-14 DIAGNOSIS — Z79899 Other long term (current) drug therapy: Secondary | ICD-10-CM | POA: Diagnosis not present

## 2017-08-14 DIAGNOSIS — F17210 Nicotine dependence, cigarettes, uncomplicated: Secondary | ICD-10-CM

## 2017-08-14 DIAGNOSIS — D696 Thrombocytopenia, unspecified: Secondary | ICD-10-CM

## 2017-08-14 DIAGNOSIS — Z923 Personal history of irradiation: Secondary | ICD-10-CM

## 2017-08-14 DIAGNOSIS — E1165 Type 2 diabetes mellitus with hyperglycemia: Secondary | ICD-10-CM

## 2017-08-14 DIAGNOSIS — I10 Essential (primary) hypertension: Secondary | ICD-10-CM

## 2017-08-14 DIAGNOSIS — Z9221 Personal history of antineoplastic chemotherapy: Secondary | ICD-10-CM

## 2017-08-14 DIAGNOSIS — E1140 Type 2 diabetes mellitus with diabetic neuropathy, unspecified: Secondary | ICD-10-CM | POA: Diagnosis not present

## 2017-08-14 DIAGNOSIS — D649 Anemia, unspecified: Secondary | ICD-10-CM | POA: Diagnosis not present

## 2017-08-14 DIAGNOSIS — Z5112 Encounter for antineoplastic immunotherapy: Secondary | ICD-10-CM | POA: Diagnosis not present

## 2017-08-14 DIAGNOSIS — Z794 Long term (current) use of insulin: Secondary | ICD-10-CM | POA: Diagnosis not present

## 2017-08-14 LAB — COMPREHENSIVE METABOLIC PANEL
ALK PHOS: 70 U/L (ref 38–126)
ALT: 17 U/L (ref 14–54)
AST: 20 U/L (ref 15–41)
Albumin: 3.3 g/dL — ABNORMAL LOW (ref 3.5–5.0)
Anion gap: 5 (ref 5–15)
BUN: 13 mg/dL (ref 6–20)
CALCIUM: 9.1 mg/dL (ref 8.9–10.3)
CO2: 25 mmol/L (ref 22–32)
CREATININE: 0.68 mg/dL (ref 0.44–1.00)
Chloride: 106 mmol/L (ref 101–111)
GFR calc Af Amer: >60 mL/min (ref >60)
GFR calc non Af Amer: >60 mL/min (ref >60)
GLUCOSE: 250 mg/dL — AB (ref 65–99)
Potassium: 3.9 mmol/L (ref 3.5–5.1)
SODIUM: 136 mmol/L (ref 135–145)
Total Bilirubin: 0.6 mg/dL (ref 0.3–1.2)
Total Protein: 6.6 g/dL (ref 6.5–8.1)

## 2017-08-14 LAB — CBC WITH DIFFERENTIAL/PLATELET
BASOS PCT: 0 %
Basophils Absolute: 0 10*3/uL (ref 0–0.1)
EOS ABS: 0.1 10*3/uL (ref 0–0.7)
Eosinophils Relative: 4 %
HEMATOCRIT: 30.1 % — AB (ref 35.0–47.0)
HEMOGLOBIN: 10.3 g/dL — AB (ref 12.0–16.0)
LYMPHS ABS: 0.9 10*3/uL — AB (ref 1.0–3.6)
Lymphocytes Relative: 22 %
MCH: 29.1 pg (ref 26.0–34.0)
MCHC: 34.2 g/dL (ref 32.0–36.0)
MCV: 85 fL (ref 80.0–100.0)
Monocytes Absolute: 0.3 10*3/uL (ref 0.2–0.9)
Monocytes Relative: 6 %
NEUTROS ABS: 2.8 10*3/uL (ref 1.4–6.5)
NEUTROS PCT: 68 %
Platelets: 149 10*3/uL — ABNORMAL LOW (ref 150–440)
RBC: 3.55 MIL/uL — AB (ref 3.80–5.20)
RDW: 14.7 % — ABNORMAL HIGH (ref 11.5–14.5)
WBC: 4.2 10*3/uL (ref 3.6–11.0)

## 2017-08-14 MED ORDER — HEPARIN SOD (PORK) LOCK FLUSH 100 UNIT/ML IV SOLN
500.0000 [IU] | Freq: Once | INTRAVENOUS | Status: DC
  Filled 2017-08-14: qty 5

## 2017-08-14 MED ORDER — SODIUM CHLORIDE 0.9 % IV SOLN
240.0000 mg | Freq: Once | INTRAVENOUS | Status: AC
  Administered 2017-08-14: 240 mg via INTRAVENOUS
  Filled 2017-08-14: qty 24

## 2017-08-14 MED ORDER — SODIUM CHLORIDE 0.9 % IV SOLN
Freq: Once | INTRAVENOUS | Status: AC
  Administered 2017-08-14: 10:00:00 via INTRAVENOUS
  Filled 2017-08-14: qty 1000

## 2017-08-14 MED ORDER — SODIUM CHLORIDE 0.9% FLUSH
10.0000 mL | Freq: Once | INTRAVENOUS | Status: AC
  Administered 2017-08-14: 10 mL via INTRAVENOUS
  Filled 2017-08-14: qty 10

## 2017-08-14 MED ORDER — TRAMADOL HCL 50 MG PO TABS: 50.0000 mg | ORAL_TABLET | Freq: Two times a day (BID) | ORAL | 2 refills | Status: DC

## 2017-08-14 MED ORDER — HEPARIN SOD (PORK) LOCK FLUSH 100 UNIT/ML IV SOLN
500.0000 [IU] | Freq: Once | INTRAVENOUS | Status: AC | PRN
  Administered 2017-08-14: 500 [IU]

## 2017-08-14 NOTE — Progress Notes (Signed)
Patient denies any concerns today.  

## 2017-08-14 NOTE — Telephone Encounter (Signed)
Patient accepted appt

## 2017-08-14 NOTE — Progress Notes (Signed)
Nutrition Follow-up:  Nutrition follow-up completed in infusion this am.  Patient with lung cancer and followed by Dr. Grayland Ormond.  Patient reports appetite has improved.  Had family reunion this weekend and ate well.  Eating graham crackers during visit today in infusion.  Patient reports that she has been drinking regular soda recently, noted elevated blood glucose.   No other nutrition symptoms mentioned.  Medications: reviewed  Labs: glucose 250  Anthropometrics:   Weight increased to 250 lb 14.4 oz today from 242 lb 14.40 oz on 8/27   NUTRITION DIAGNOSIS: Inadequate food and beverage intake improving   MALNUTRITION DIAGNOSIS: continue to monitor    INTERVENTION:   Discussed importance of good blood glucose control and choosing healthy foods to help with better blood glucose control.  Blood glucose fact sheet given to patient. Recommend at this time glucerna shake, boost glucose control shake vs regular products for better blood glucose control.  May not need at all with improved intake and weight gain.    MONITORING, EVALUATION, GOAL: weight trends, intake, labs   NEXT VISIT: Sept 24 during infusion  Thelma Viana B. Zenia Resides, [place], [place] Registered Dietitian [phone] (pager)

## 2017-08-14 NOTE — Telephone Encounter (Signed)
Appointment given for tomorrow with Dr Gwynneth Aliment at 10:20 am. Will give this to patient while in office today

## 2017-08-15 DIAGNOSIS — E119 Type 2 diabetes mellitus without complications: Secondary | ICD-10-CM | POA: Diagnosis not present

## 2017-08-15 LAB — THYROID PANEL WITH TSH
FREE THYROXINE INDEX: 0.6 — AB (ref 1.2–4.9)
T3 UPTAKE RATIO: 20 % — AB (ref 24–39)
T4, Total: 3.2 ug/dL — ABNORMAL LOW (ref 4.5–12.0)
TSH: 22.31 u[IU]/mL — AB (ref 0.450–4.500)

## 2017-08-25 ENCOUNTER — Encounter
Admission: RE | Admit: 2017-08-25 | Discharge: 2017-08-25 | Disposition: A | Discharge status: Home / Self Care | Payer: PPO | Source: Ambulatory Visit | Attending: Oncology | Admitting: Oncology

## 2017-08-25 DIAGNOSIS — C3490 Malignant neoplasm of unspecified part of unspecified bronchus or lung: Secondary | ICD-10-CM | POA: Diagnosis not present

## 2017-08-25 DIAGNOSIS — C3412 Malignant neoplasm of upper lobe, left bronchus or lung: Secondary | ICD-10-CM | POA: Insufficient documentation

## 2017-08-25 LAB — GLUCOSE, CAPILLARY: GLUCOSE-CAPILLARY: 149 mg/dL — AB (ref 65–99)

## 2017-08-25 MED ORDER — FLUDEOXYGLUCOSE F - 18 (FDG) INJECTION
12.7300 | Freq: Once | INTRAVENOUS | Status: AC | PRN
  Administered 2017-08-25: 12.73 via INTRAVENOUS

## 2017-08-27 NOTE — Progress Notes (Signed)
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  INTERVAL HISTORY: Patient returns to clinic today for further evaluation and consideration of cycle 9 of nivolumab. She continues to have mild weakness and fatigue, but otherwise feels well. She has no new neurologic complaints. She does not complain of peripheral neuropathy today. She denies any recent fevers or illnesses. She denies any chest pain, cough, hemoptysis, or shortness of breath. She denies nausea, constipation, diarrhea, nausea, or vomiting.  She has no urinary complaints. Patient offers no further specific complaints today.  REVIEW OF SYSTEMS:   Review of Systems  Constitutional: Positive for malaise/fatigue. Negative for fever and weight loss.  Respiratory: Negative for cough, hemoptysis and shortness of breath.   Cardiovascular: Negative.  Negative for chest pain and leg swelling.  Gastrointestinal: Negative.  Negative for abdominal pain.  Genitourinary: Negative for flank pain.  Musculoskeletal: Positive for joint pain. Negative for myalgias.  Skin: Negative.  Negative for rash.  Neurological: Positive for weakness. Negative for dizziness, sensory change and headaches.  Psychiatric/Behavioral: Negative.  The patient is not nervous/anxious.     As per HPI. Otherwise, a complete review of systems is negative.  PAST MEDICAL HISTORY: Past Medical History:  Diagnosis Date  . Arthritis   . Blood dyscrasia   . Cancer of left lung ([place]) 08/2015   Rad + chemo tx's.  . Diabetes mellitus without  complication ([place])   . Diabetic neuropathy ([place])   . High cholesterol   . Hypertension     PAST SURGICAL HISTORY: Past Surgical History:  Procedure Laterality Date  . ABDOMINAL HYSTERECTOMY    . CESAREAN SECTION    . ELECTROMAGNETIC NAVIGATION BROCHOSCOPY N/A 11/26/2015   Procedure: ELECTROMAGNETIC NAVIGATION BRONCHOSCOPY;  Surgeon: Flora Lipps, MD;  Location: [facility];  Service: Cardiopulmonary;  Laterality: N/A;  . ENDOBRONCHIAL ULTRASOUND N/A 11/26/2015   Procedure: ENDOBRONCHIAL ULTRASOUND;  Surgeon: Flora Lipps, MD;  Location: [facility];  Service: Cardiopulmonary;  Laterality: N/A;  . PERIPHERAL VASCULAR CATHETERIZATION N/A 12/09/2015   Procedure: Glori Luis Cath Insertion;  Surgeon: Katha Cabal, MD;  Location: [facility];  Service: Cardiovascular;  Laterality: N/A;    FAMILY HISTORY: Reviewed and unchanged. No reported history of malignancy or chronic disease.     ADVANCED DIRECTIVES:    HEALTH MAINTENANCE: Social History  Substance Use Topics  . Smoking status: Current Every Day Smoker    Packs/day: 0.30    Years: 35.00    Types: Cigarettes  . Smokeless tobacco: Never Used     Comment: workiing on quitting, down to 2 packs per week now  . Alcohol use No     No Known Allergies  Current Outpatient Prescriptions  Medication Sig Dispense Refill  . albuterol (PROVENTIL HFA;VENTOLIN HFA) 108 (90 Base) MCG/ACT inhaler Inhale 2 puffs into the lungs every 6 (six) hours as needed for wheezing or shortness of breath. 1 Inhaler 2  . atorvastatin (LIPITOR) 20 MG tablet Take 20 mg by mouth daily at 6 PM.    . gabapentin (NEURONTIN) 300 MG capsule Take 1 capsule (300 mg total) by mouth at bedtime. 30 capsule 0  .  insulin glargine (LANTUS) 100 UNIT/ML injection Inject 60 Units into the skin at bedtime.    . lidocaine-prilocaine (EMLA) cream Apply to affected area once 30 g 6  . linagliptin (TRADJENTA) 5 MG TABS tablet Take 5 mg by mouth daily.     Marland Kitchen lisinopril  (PRINIVIL,ZESTRIL) 10 MG tablet Take 10 mg by mouth daily.    Marland Kitchen oxyCODONE (OXY IR/ROXICODONE) 5 MG immediate release tablet Take 1 tablet (5 mg total) by mouth every 6 (six) hours as needed for severe pain. 30 tablet 0  . traMADol (ULTRAM) 50 MG tablet Take 1 tablet (50 mg total) by mouth 2 (two) times daily. 60 tablet 2   No current facility-administered medications for this visit.     OBJECTIVE: Vitals:   08/28/17 0901  BP: (!) 147/87  Pulse: 76  Resp: 18  Temp: (!) 96.1 F (35.6 C)     Body mass index is 40.69 kg/m.    ECOG FS:1 - Symptomatic but completely ambulatory  General: Well-developed, well-nourished, no acute distress. Eyes: Pink conjunctiva, anicteric sclera. Lungs: Clear to auscultation bilaterally. Heart: Regular rate and rhythm. No rubs, murmurs, or gallops. Abdomen: Soft, nontender, nondistended. No organomegaly noted, normoactive bowel sounds. Musculoskeletal: No edema, cyanosis, or clubbing. Neuro: Alert, answering all questions appropriately. Cranial nerves grossly intact. Skin: No rashes or petechiae noted. Psych: Normal affect.   LAB RESULTS:  Lab Results  Component Value Date   NA 137 08/28/2017   K 4.2 08/28/2017   CL 105 08/28/2017   CO2 26 08/28/2017   GLUCOSE 244 (H) 08/28/2017   BUN 12 08/28/2017   CREATININE 0.81 08/28/2017   CALCIUM 9.3 08/28/2017   PROT 6.8 08/28/2017   ALBUMIN 3.5 08/28/2017   AST 21 08/28/2017   ALT 17 08/28/2017   ALKPHOS 85 08/28/2017   BILITOT 0.5 08/28/2017   GFRNONAA >60 08/28/2017   GFRAA >60 08/28/2017    Lab Results  Component Value Date   WBC 4.4 08/28/2017   NEUTROABS 3.0 08/28/2017   HGB 11.4 (L) 08/28/2017   HCT 33.1 (L) 08/28/2017   MCV 86.5 08/28/2017   PLT 150 08/28/2017   Lab Results  Component Value Date   IRON 42 07/31/2017   TIBC 280 07/31/2017   IRONPCTSAT 15 07/31/2017    Lab Results  Component Value Date   FERRITIN 81 07/31/2017     STUDIES: Nm Pet Image Restag (ps) Skull  Base To Thigh  Result Date: 08/25/2017 CLINICAL DATA:  Subsequent treatment strategy for lung cancer. EXAM: NUCLEAR MEDICINE PET SKULL BASE TO THIGH TECHNIQUE: 12.73 mCi F-18 FDG was injected intravenously. Full-ring PET imaging was performed from the skull base to thigh after the radiotracer. CT data was obtained and used for attenuation correction and anatomic localization. FASTING BLOOD GLUCOSE:  Value: 149 mg/dl COMPARISON:  PET-CT January 13, 2017, chest CT Apr 18, 2017, and brain MRI Apr 27, 2017 FINDINGS: NECK: No hypermetabolic lymph nodes in the neck. CHEST: Opacity remains at the site of the patient's previous tumor in the left upper lobe. There is mild associated increased uptake which is similar to mediastinal blood pool. The maximum SUV of 4 is at the site of the previous positive node. The maximum SUV in this region previously with 6.8. No other abnormal FDG uptake in the chest. At the site of the patient's previously avid node, the uptake is slightly higher with a maximum SUV of Mediastinal blood pool uptake on image 93 is 4.1. ABDOMEN/PELVIS: No abnormal hypermetabolic activity within the  liver, pancreas, adrenal glands, or spleen. No hypermetabolic lymph nodes in the abdomen or pelvis. SKELETON: No focal hypermetabolic activity to suggest skeletal metastasis. IMPRESSION: 1. There is decreasing opacity in the left upper lobe at the site of the patient's primary tumor. The low level of uptake is most in keeping with post therapeutic change. The most prominent uptake is at the site of the previous prevascular node. The maximum SUV of 4 in this region today is decreased in the interval and may also be post therapeutic. Recommend attention to this region on follow-up. 2. No distant metastatic disease. 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 Stage IIIa squamous cell  carcinoma of the upper lobe of left lung: Patient completed her initial treatment with concurrent chemotherapy and XRT on March 07, 2016. She then underwent 2 cycles of consolidation carboplatinum and Taxol completing on Apr 27, 2016.  CT scan results from Apr 18, 2017 reviewed independently with progression of disease. Patient has now completed additional XRT. PET scan results from August 25, 2017 reviewed independently and reported as above with improvement of disease burden. Will reimage in January 2019. Proceed with cycle 9 of nivolumab today. Return to clinic in 2 weeks for consideration of cycle 10.  2. Headaches/dizziness: MRI of the brain reviewed independently and reported as above with no obvious metastatic disease. 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 Okay to proceed from an oncology standpoint. 3. Anemia: Improving. Previously iron stores were within normal limits, monitor.  4. Thrombocytopenia: Resolved.  5. Hyperglycemia: Continue diabetic medications as prescribed. Patient has been instructed to follow-up with her primary care physician to help improve her glycemic control. 6. Pain: Patient does not complain of this today. Continue oxycodone as prescribed. 7. Thyroid: Patient's TSH is trending down with a mildly elevated T4. Consider endocrinology referral.  Patient expressed understanding and was in agreement with this plan. She also understands that She can call clinic at any time with any questions, concerns, or complaints.    [name], MD   08/31/2017 10:13 AM

## 2017-08-28 ENCOUNTER — Inpatient Hospital Stay: Payer: PPO

## 2017-08-28 ENCOUNTER — Inpatient Hospital Stay (HOSPITAL_BASED_OUTPATIENT_CLINIC_OR_DEPARTMENT_OTHER): Payer: PPO | Admitting: Oncology

## 2017-08-28 VITALS — BP 147/87 | HR 76 | Temp 96.1°F | Resp 18 | Wt 252.1 lb

## 2017-08-28 DIAGNOSIS — Z79899 Other long term (current) drug therapy: Secondary | ICD-10-CM | POA: Diagnosis not present

## 2017-08-28 DIAGNOSIS — C3412 Malignant neoplasm of upper lobe, left bronchus or lung: Secondary | ICD-10-CM

## 2017-08-28 DIAGNOSIS — Z9221 Personal history of antineoplastic chemotherapy: Secondary | ICD-10-CM | POA: Diagnosis not present

## 2017-08-28 DIAGNOSIS — E1165 Type 2 diabetes mellitus with hyperglycemia: Secondary | ICD-10-CM | POA: Diagnosis not present

## 2017-08-28 DIAGNOSIS — F17210 Nicotine dependence, cigarettes, uncomplicated: Secondary | ICD-10-CM | POA: Diagnosis not present

## 2017-08-28 DIAGNOSIS — R5383 Other fatigue: Secondary | ICD-10-CM | POA: Diagnosis not present

## 2017-08-28 DIAGNOSIS — D696 Thrombocytopenia, unspecified: Secondary | ICD-10-CM

## 2017-08-28 DIAGNOSIS — Z923 Personal history of irradiation: Secondary | ICD-10-CM | POA: Diagnosis not present

## 2017-08-28 DIAGNOSIS — D649 Anemia, unspecified: Secondary | ICD-10-CM | POA: Diagnosis not present

## 2017-08-28 DIAGNOSIS — R51 Headache: Secondary | ICD-10-CM

## 2017-08-28 DIAGNOSIS — Z5112 Encounter for antineoplastic immunotherapy: Secondary | ICD-10-CM | POA: Diagnosis not present

## 2017-08-28 LAB — CBC WITH DIFFERENTIAL/PLATELET
BASOS ABS: 0 10*3/uL (ref 0–0.1)
BASOS PCT: 1 %
EOS ABS: 0.2 10*3/uL (ref 0–0.7)
Eosinophils Relative: 4 %
HEMATOCRIT: 33.1 % — AB (ref 35.0–47.0)
HEMOGLOBIN: 11.4 g/dL — AB (ref 12.0–16.0)
Lymphocytes Relative: 23 %
Lymphs Abs: 1 10*3/uL (ref 1.0–3.6)
MCH: 29.7 pg (ref 26.0–34.0)
MCHC: 34.3 g/dL (ref 32.0–36.0)
MCV: 86.5 fL (ref 80.0–100.0)
MONOS PCT: 6 %
Monocytes Absolute: 0.2 10*3/uL (ref 0.2–0.9)
NEUTROS ABS: 3 10*3/uL (ref 1.4–6.5)
NEUTROS PCT: 66 %
Platelets: 150 10*3/uL (ref 150–440)
RBC: 3.82 MIL/uL (ref 3.80–5.20)
RDW: 15.9 % — ABNORMAL HIGH (ref 11.5–14.5)
WBC: 4.4 10*3/uL (ref 3.6–11.0)

## 2017-08-28 LAB — COMPREHENSIVE METABOLIC PANEL
ALBUMIN: 3.5 g/dL (ref 3.5–5.0)
ALK PHOS: 85 U/L (ref 38–126)
ALT: 17 U/L (ref 14–54)
ANION GAP: 6 (ref 5–15)
AST: 21 U/L (ref 15–41)
BILIRUBIN TOTAL: 0.5 mg/dL (ref 0.3–1.2)
BUN: 12 mg/dL (ref 6–20)
CALCIUM: 9.3 mg/dL (ref 8.9–10.3)
CO2: 26 mmol/L (ref 22–32)
Chloride: 105 mmol/L (ref 101–111)
Creatinine, Ser: 0.81 mg/dL (ref 0.44–1.00)
GFR calc Af Amer: >60 mL/min (ref >60)
GFR calc non Af Amer: >60 mL/min (ref >60)
GLUCOSE: 244 mg/dL — AB (ref 65–99)
Potassium: 4.2 mmol/L (ref 3.5–5.1)
SODIUM: 137 mmol/L (ref 135–145)
TOTAL PROTEIN: 6.8 g/dL (ref 6.5–8.1)

## 2017-08-28 MED ORDER — OXYCODONE HCL 5 MG PO TABS: 5.0000 mg | ORAL_TABLET | Freq: Four times a day (QID) | ORAL | 0 refills | Status: DC | PRN

## 2017-08-28 MED ORDER — SODIUM CHLORIDE 0.9% FLUSH
10.0000 mL | INTRAVENOUS | Status: DC | PRN
  Administered 2017-08-28: 10 mL via INTRAVENOUS
  Filled 2017-08-28: qty 10

## 2017-08-28 MED ORDER — SODIUM CHLORIDE 0.9 % IV SOLN
Freq: Once | INTRAVENOUS | Status: AC
  Administered 2017-08-28: 10:00:00 via INTRAVENOUS
  Filled 2017-08-28: qty 1000

## 2017-08-28 MED ORDER — HEPARIN SOD (PORK) LOCK FLUSH 100 UNIT/ML IV SOLN
500.0000 [IU] | Freq: Once | INTRAVENOUS | Status: AC
  Administered 2017-08-28: 500 [IU] via INTRAVENOUS
  Filled 2017-08-28: qty 5

## 2017-08-28 MED ORDER — NIVOLUMAB CHEMO INJECTION 100 MG/10ML
240.0000 mg | Freq: Once | INTRAVENOUS | Status: AC
  Administered 2017-08-28: 240 mg via INTRAVENOUS
  Filled 2017-08-28: qty 24

## 2017-08-28 NOTE — Progress Notes (Signed)
Patient denies any concerns today.  

## 2017-08-28 NOTE — Progress Notes (Signed)
Nutrition Follow-up:  Nutrition follow-up completed in infusion this am.  Patient with lung cancer and followed by Dr. Grayland Ormond.  Patient reports appetite is good.  Reports that she is trying to avoid regular soda and beverages with sugar although continues to use regular sugar in coffee.  Reports that she is drinking glucerna shake typically for breakfast, skips lunch then has meat and vegetable or beans and vegetable for dinner.    Reports that she has been checking blood glucose a little more regular and has started taking her insulin more regularly.   Medications: lantus, tradjenta  Labs: glucose 244  Anthropometrics:   Weight increased to 252 lb 1.6 oz from 250 lb 14.4 oz on 9/10  NUTRITION DIAGNOSIS: Inadequate food and beverage intake improving   MALNUTRITION DIAGNOSIS: none at this time   INTERVENTION:   Encouraged patient to avoid sugar containing beverages to help with better blood glucose control.  Encouraged patient to use splenda in coffee vs regular sugar Encouraged patient to continue to take diabetic medication to control blood glucose.   Discussed importance of eating regular meals and focused on good sources of protein foods.   Patient not eating breakfast but drinking glucerna.  Coupons given  MONITORING, EVALUATION, GOAL: weight trends, glucose   NEXT VISIT: as needed  Juvenal Umar B. Zenia Resides, [place], [place] Registered Dietitian [phone] (pager)

## 2017-08-29 LAB — THYROID PANEL WITH TSH
FREE THYROXINE INDEX: 0.3 — AB (ref 1.2–4.9)
T3 UPTAKE RATIO: 18 % — AB (ref 24–39)
T4 TOTAL: 1.7 ug/dL — AB (ref 4.5–12.0)
TSH: 57.07 u[IU]/mL — AB (ref 0.450–4.500)

## 2017-08-31 ENCOUNTER — Ambulatory Visit: Payer: PPO | Admitting: Radiation Oncology

## 2017-09-10 NOTE — Progress Notes (Signed)
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  INTERVAL HISTORY: Patient returns to clinic today for further evaluation and consideration of cycle 10 of nivolumab. She has worsening weakness and fatigue. She has a poor appetite and states she feels "empty" inside. She has no new neurologic complaints. She does not complain of peripheral neuropathy today. She denies any recent fevers or illnesses. She denies any chest pain, cough, hemoptysis, or shortness of breath. She denies nausea, constipation, diarrhea, nausea, or vomiting.  She has no urinary complaints. Patient offers no further specific complaints today.  REVIEW OF SYSTEMS:   Review of Systems  Constitutional: Positive for malaise/fatigue. Negative for fever and weight loss.  Respiratory: Negative for cough, hemoptysis and shortness of breath.   Cardiovascular: Negative.  Negative for chest pain and leg swelling.  Gastrointestinal: Negative.  Negative for abdominal pain.  Genitourinary: Negative for flank pain.  Musculoskeletal: Positive for joint pain. Negative for myalgias.  Skin: Negative.  Negative for rash.  Neurological: Positive for weakness. Negative for dizziness, sensory change and headaches.  Psychiatric/Behavioral: Negative.  The patient is not nervous/anxious.     As per HPI. Otherwise, a complete review of systems is negative.  PAST MEDICAL HISTORY: Past Medical History:  Diagnosis Date  . Arthritis   . Blood dyscrasia   . Cancer of left lung ([place]) 08/2015   Rad + chemo tx's.  .  Diabetes mellitus without complication ([place])   . Diabetic neuropathy ([place])   . High cholesterol   . Hypertension     PAST SURGICAL HISTORY: Past Surgical History:  Procedure Laterality Date  . ABDOMINAL HYSTERECTOMY    . CESAREAN SECTION    . ELECTROMAGNETIC NAVIGATION BROCHOSCOPY N/A 11/26/2015   Procedure: ELECTROMAGNETIC NAVIGATION BRONCHOSCOPY;  Surgeon: Flora Lipps, MD;  Location: [facility];  Service: Cardiopulmonary;  Laterality: N/A;  . ENDOBRONCHIAL ULTRASOUND N/A 11/26/2015   Procedure: ENDOBRONCHIAL ULTRASOUND;  Surgeon: Flora Lipps, MD;  Location: [facility];  Service: Cardiopulmonary;  Laterality: N/A;  . PERIPHERAL VASCULAR CATHETERIZATION N/A 12/09/2015   Procedure: Glori Luis Cath Insertion;  Surgeon: Katha Cabal, MD;  Location: [place] [facility];  Service: Cardiovascular;  Laterality: N/A;    FAMILY HISTORY: Reviewed and unchanged. No reported history of malignancy or chronic disease.     ADVANCED DIRECTIVES:    HEALTH MAINTENANCE: Social History  Substance Use Topics  . Smoking status: Current Every Day Smoker    Packs/day: 0.30    Years: 35.00    Types: Cigarettes  . Smokeless tobacco: Never Used     Comment: workiing on quitting, down to 2 packs per week now  . Alcohol use No     No Known Allergies  Current Outpatient Prescriptions  Medication Sig Dispense Refill  . albuterol (PROVENTIL HFA;VENTOLIN HFA) 108 (90 Base) MCG/ACT inhaler Inhale 2 puffs into the lungs every 6 (six) hours as needed for wheezing or shortness of breath. 1 Inhaler 2  . atorvastatin (LIPITOR) 20 MG tablet Take 20 mg by mouth daily at 6 PM.    . gabapentin (NEURONTIN) 300 MG capsule Take 1 capsule (300 mg total) by mouth  at bedtime. 30 capsule 0  . insulin glargine (LANTUS) 100 UNIT/ML injection Inject 60 Units into the skin at bedtime.    . lidocaine-prilocaine (EMLA) cream Apply to affected area once 30 g 6  . linagliptin (TRADJENTA) 5 MG TABS tablet Take 5 mg by mouth daily.      Marland Kitchen lisinopril (PRINIVIL,ZESTRIL) 10 MG tablet Take 10 mg by mouth daily.    Marland Kitchen oxyCODONE (OXY IR/ROXICODONE) 5 MG immediate release tablet Take 1 tablet (5 mg total) by mouth every 6 (six) hours as needed for severe pain. 30 tablet 0  . traMADol (ULTRAM) 50 MG tablet Take 1 tablet (50 mg total) by mouth 2 (two) times daily. 60 tablet 2   No current facility-administered medications for this visit.     OBJECTIVE: There were no vitals filed for this visit.   There is no height or weight on file to calculate BMI.    ECOG FS:1 - Symptomatic but completely ambulatory  General: Well-developed, well-nourished, no acute distress. Eyes: Pink conjunctiva, anicteric sclera. Lungs: Clear to auscultation bilaterally. Heart: Regular rate and rhythm. No rubs, murmurs, or gallops. Abdomen: Soft, nontender, nondistended. No organomegaly noted, normoactive bowel sounds. Musculoskeletal: No edema, cyanosis, or clubbing. Neuro: Alert, answering all questions appropriately. Cranial nerves grossly intact. Skin: No rashes or petechiae noted. Psych: Normal affect.   LAB RESULTS:  Lab Results  Component Value Date   NA 137 08/28/2017   K 4.2 08/28/2017   CL 105 08/28/2017   CO2 26 08/28/2017   GLUCOSE 244 (H) 08/28/2017   BUN 12 08/28/2017   CREATININE 0.81 08/28/2017   CALCIUM 9.3 08/28/2017   PROT 6.8 08/28/2017   ALBUMIN 3.5 08/28/2017   AST 21 08/28/2017   ALT 17 08/28/2017   ALKPHOS 85 08/28/2017   BILITOT 0.5 08/28/2017   GFRNONAA >60 08/28/2017   GFRAA >60 08/28/2017    Lab Results  Component Value Date   WBC 4.4 08/28/2017   NEUTROABS 3.0 08/28/2017   HGB 11.4 (L) 08/28/2017   HCT 33.1 (L) 08/28/2017   MCV 86.5 08/28/2017   PLT 150 08/28/2017   Lab Results  Component Value Date   IRON 42 07/31/2017   TIBC 280 07/31/2017   IRONPCTSAT 15 07/31/2017    Lab Results  Component Value Date   FERRITIN 81 07/31/2017     STUDIES: Nm Pet Image Restag (ps) Skull Base To  Thigh  Result Date: 08/25/2017 CLINICAL DATA:  Subsequent treatment strategy for lung cancer. EXAM: NUCLEAR MEDICINE PET SKULL BASE TO THIGH TECHNIQUE: 12.73 mCi F-18 FDG was injected intravenously. Full-ring PET imaging was performed from the skull base to thigh after the radiotracer. CT data was obtained and used for attenuation correction and anatomic localization. FASTING BLOOD GLUCOSE:  Value: 149 mg/dl COMPARISON:  PET-CT January 13, 2017, chest CT Apr 18, 2017, and brain MRI Apr 27, 2017 FINDINGS: NECK: No hypermetabolic lymph nodes in the neck. CHEST: Opacity remains at the site of the patient's previous tumor in the left upper lobe. There is mild associated increased uptake which is similar to mediastinal blood pool. The maximum SUV of 4 is at the site of the previous positive node. The maximum SUV in this region previously with 6.8. No other abnormal FDG uptake in the chest. At the site of the patient's previously avid node, the uptake is slightly higher with a maximum SUV of Mediastinal blood pool uptake on image 93 is 4.1. ABDOMEN/PELVIS: No abnormal hypermetabolic activity within the liver, pancreas, adrenal glands,  or spleen. No hypermetabolic lymph nodes in the abdomen or pelvis. SKELETON: No focal hypermetabolic activity to suggest skeletal metastasis. IMPRESSION: 1. There is decreasing opacity in the left upper lobe at the site of the patient's primary tumor. The low level of uptake is most in keeping with post therapeutic change. The most prominent uptake is at the site of the previous prevascular node. The maximum SUV of 4 in this region today is decreased in the interval and may also be post therapeutic. Recommend attention to this region on follow-up. 2. No distant metastatic disease. 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 Stage IIIa squamous cell carcinoma  of the upper lobe of left lung: Patient completed her initial treatment with concurrent chemotherapy and XRT on March 07, 2016. She then underwent 2 cycles of consolidation carboplatinum and Taxol completing on Apr 27, 2016.  CT scan results from Apr 18, 2017 reviewed independently with progression of disease. Patient has now completed additional XRT. PET scan results from August 25, 2017 reviewed independently and reported as above with improvement of disease burden. Will reimage in January 2019. Delay cycle 10 of nivolumab today. Return to clinic in 2 weeks for rconsideration of cycle 10.  2. Headaches/dizziness: MRI of the brain reviewed independently and reported as above with no obvious metastatic disease. 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 Okay to proceed from an oncology standpoint. Patient states she has postponed her appointments with neurosurgery until she can resolve a personal matter involving her son and daughter-in-law. 3. Anemia: Improving. Previously iron stores were within normal limits, monitor.  4. Thrombocytopenia: Resolved.  5. Hyperglycemia: Continue diabetic medications as prescribed. Patient has been instructed to follow-up with her primary care physician to help improve her glycemic control. 6. Pain: Patient does not complain of this today. Continue oxycodone as prescribed. 7. Thyroid: Patient's thyroid panel is decreased likely contributing to her worsening weakness and fatigue and "emptiness" inside. Delay treatment as above and initiate 100 g Synthroid daily. Patient was also given a referral to endocrinology.   Patient expressed understanding and was in agreement with this plan. She also understands that She can call clinic at any time with any questions, concerns, or complaints.    [name], MD   09/10/2017 8:40 AM

## 2017-09-11 ENCOUNTER — Inpatient Hospital Stay: Payer: PPO

## 2017-09-11 ENCOUNTER — Inpatient Hospital Stay: Payer: PPO | Attending: Oncology | Admitting: Oncology

## 2017-09-11 VITALS — BP 107/72 | HR 76 | Resp 18 | Wt 257.2 lb

## 2017-09-11 DIAGNOSIS — Z5112 Encounter for antineoplastic immunotherapy: Secondary | ICD-10-CM | POA: Insufficient documentation

## 2017-09-11 DIAGNOSIS — R531 Weakness: Secondary | ICD-10-CM | POA: Insufficient documentation

## 2017-09-11 DIAGNOSIS — E039 Hypothyroidism, unspecified: Secondary | ICD-10-CM

## 2017-09-11 DIAGNOSIS — Z9221 Personal history of antineoplastic chemotherapy: Secondary | ICD-10-CM

## 2017-09-11 DIAGNOSIS — C3412 Malignant neoplasm of upper lobe, left bronchus or lung: Secondary | ICD-10-CM | POA: Insufficient documentation

## 2017-09-11 DIAGNOSIS — Z794 Long term (current) use of insulin: Secondary | ICD-10-CM | POA: Insufficient documentation

## 2017-09-11 DIAGNOSIS — Z923 Personal history of irradiation: Secondary | ICD-10-CM | POA: Diagnosis not present

## 2017-09-11 DIAGNOSIS — Z79899 Other long term (current) drug therapy: Secondary | ICD-10-CM | POA: Insufficient documentation

## 2017-09-11 DIAGNOSIS — Z95828 Presence of other vascular implants and grafts: Secondary | ICD-10-CM

## 2017-09-11 DIAGNOSIS — I10 Essential (primary) hypertension: Secondary | ICD-10-CM | POA: Insufficient documentation

## 2017-09-11 DIAGNOSIS — D696 Thrombocytopenia, unspecified: Secondary | ICD-10-CM | POA: Insufficient documentation

## 2017-09-11 DIAGNOSIS — F17210 Nicotine dependence, cigarettes, uncomplicated: Secondary | ICD-10-CM | POA: Insufficient documentation

## 2017-09-11 DIAGNOSIS — D649 Anemia, unspecified: Secondary | ICD-10-CM | POA: Diagnosis not present

## 2017-09-11 DIAGNOSIS — E1140 Type 2 diabetes mellitus with diabetic neuropathy, unspecified: Secondary | ICD-10-CM | POA: Insufficient documentation

## 2017-09-11 DIAGNOSIS — E7800 Pure hypercholesterolemia, unspecified: Secondary | ICD-10-CM | POA: Diagnosis not present

## 2017-09-11 LAB — CBC WITH DIFFERENTIAL/PLATELET
BASOS ABS: 0 10*3/uL (ref 0–0.1)
BASOS PCT: 1 %
Eosinophils Absolute: 0.1 10*3/uL (ref 0–0.7)
Eosinophils Relative: 4 %
HEMATOCRIT: 33.4 % — AB (ref 35.0–47.0)
HEMOGLOBIN: 11.1 g/dL — AB (ref 12.0–16.0)
Lymphocytes Relative: 26 %
Lymphs Abs: 0.9 10*3/uL — ABNORMAL LOW (ref 1.0–3.6)
MCH: 29.3 pg (ref 26.0–34.0)
MCHC: 33.1 g/dL (ref 32.0–36.0)
MCV: 88.6 fL (ref 80.0–100.0)
Monocytes Absolute: 0.2 10*3/uL (ref 0.2–0.9)
Monocytes Relative: 5 %
NEUTROS ABS: 2.2 10*3/uL (ref 1.4–6.5)
NEUTROS PCT: 64 %
Platelets: 144 10*3/uL — ABNORMAL LOW (ref 150–440)
RBC: 3.77 MIL/uL — ABNORMAL LOW (ref 3.80–5.20)
RDW: 16.5 % — ABNORMAL HIGH (ref 11.5–14.5)
WBC: 3.4 10*3/uL — ABNORMAL LOW (ref 3.6–11.0)

## 2017-09-11 LAB — COMPREHENSIVE METABOLIC PANEL
ALBUMIN: 3.4 g/dL — AB (ref 3.5–5.0)
ALK PHOS: 67 U/L (ref 38–126)
ALT: 18 U/L (ref 14–54)
AST: 23 U/L (ref 15–41)
Anion gap: 7 (ref 5–15)
BILIRUBIN TOTAL: 0.6 mg/dL (ref 0.3–1.2)
BUN: 13 mg/dL (ref 6–20)
CALCIUM: 8.8 mg/dL — AB (ref 8.9–10.3)
CO2: 27 mmol/L (ref 22–32)
Chloride: 105 mmol/L (ref 101–111)
Creatinine, Ser: 0.92 mg/dL (ref 0.44–1.00)
GFR calc Af Amer: >60 mL/min (ref >60)
GFR calc non Af Amer: >60 mL/min (ref >60)
GLUCOSE: 255 mg/dL — AB (ref 65–99)
Potassium: 3.9 mmol/L (ref 3.5–5.1)
Sodium: 139 mmol/L (ref 135–145)
TOTAL PROTEIN: 6.4 g/dL — AB (ref 6.5–8.1)

## 2017-09-11 MED ORDER — HEPARIN SOD (PORK) LOCK FLUSH 100 UNIT/ML IV SOLN
500.0000 [IU] | Freq: Once | INTRAVENOUS | Status: AC
  Administered 2017-09-11: 500 [IU] via INTRAVENOUS

## 2017-09-11 MED ORDER — LEVOTHYROXINE SODIUM 100 MCG PO TABS: 100.0000 ug | ORAL_TABLET | Freq: Every day | ORAL | 2 refills | Status: DC

## 2017-09-11 NOTE — Progress Notes (Signed)
No treatment today, port deaccessed. Gauze dressing applied

## 2017-09-11 NOTE — Progress Notes (Signed)
Patient reports weakness, fatigue, dizziness and issues with balance today.

## 2017-09-12 LAB — THYROID PANEL WITH TSH
Free Thyroxine Index: 0.2 — ABNORMAL LOW (ref 1.2–4.9)
T3 Uptake Ratio: 19 % — ABNORMAL LOW (ref 24–39)
T4, Total: 0.8 ug/dL — ABNORMAL LOW (ref 4.5–12.0)
TSH: 69.38 u[IU]/mL — AB (ref 0.450–4.500)

## 2017-09-22 ENCOUNTER — Other Ambulatory Visit: Payer: Self-pay | Admitting: Oncology

## 2017-09-23 NOTE — Progress Notes (Signed)
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  INTERVAL HISTORY: Patient returns to clinic today for further evaluation and reconsideration of cycle 10 of nivolumab. Since initiating Synthroid she feels significantly improved and back to her baseline. Her appetite has improved as well. She has no new neurologic complaints. She does not complain of peripheral neuropathy today. She denies any recent fevers or illnesses. She denies any chest pain, cough, hemoptysis, or shortness of breath. She denies nausea, constipation, diarrhea, nausea, or vomiting.  She has no urinary complaints. Patient offers no specific complaints today.  REVIEW OF SYSTEMS:   Review of Systems  Constitutional: Negative.  Negative for fever, malaise/fatigue and weight loss.  Respiratory: Negative for cough, hemoptysis and shortness of breath.   Cardiovascular: Negative.  Negative for chest pain and leg swelling.  Gastrointestinal: Negative.  Negative for abdominal pain.  Genitourinary: Negative for flank pain.  Musculoskeletal: Negative.  Negative for joint pain and myalgias.  Skin: Negative.  Negative for rash.  Neurological: Negative for dizziness, sensory change, weakness and headaches.  Psychiatric/Behavioral: Negative.  The patient is not nervous/anxious.     As per HPI. Otherwise, a complete review of systems is negative.  PAST MEDICAL HISTORY: Past Medical History:  Diagnosis Date  . Arthritis   . Blood dyscrasia   . Cancer  of left lung ([place]) 08/2015   Rad + chemo tx's.  . Diabetes mellitus without complication ([place])   . Diabetic neuropathy ([place])   . High cholesterol   . Hypertension     PAST SURGICAL HISTORY: Past Surgical History:  Procedure Laterality Date  . ABDOMINAL HYSTERECTOMY    . CESAREAN SECTION    . ELECTROMAGNETIC NAVIGATION BROCHOSCOPY N/A 11/26/2015   Procedure: ELECTROMAGNETIC NAVIGATION BRONCHOSCOPY;  Surgeon: Flora Lipps, MD;  Location: [facility];  Service: Cardiopulmonary;  Laterality: N/A;  . ENDOBRONCHIAL ULTRASOUND N/A 11/26/2015   Procedure: ENDOBRONCHIAL ULTRASOUND;  Surgeon: Flora Lipps, MD;  Location: [facility];  Service: Cardiopulmonary;  Laterality: N/A;  . PERIPHERAL VASCULAR CATHETERIZATION N/A 12/09/2015   Procedure: Glori Luis Cath Insertion;  Surgeon: Katha Cabal, MD;  Location: [place] [facility];  Service: Cardiovascular;  Laterality: N/A;    FAMILY HISTORY: Reviewed and unchanged. No reported history of malignancy or chronic disease.     ADVANCED DIRECTIVES:    HEALTH MAINTENANCE: Social History  Substance Use Topics  . Smoking status: Current Every Day Smoker    Packs/day: 0.30    Years: 35.00    Types: Cigarettes  . Smokeless tobacco: Never Used     Comment: workiing on quitting, down to 2 packs per week now  . Alcohol use No     No Known Allergies  Current Outpatient Prescriptions  Medication Sig Dispense Refill  . albuterol (PROVENTIL HFA;VENTOLIN HFA) 108 (90 Base) MCG/ACT inhaler Inhale 2 puffs into the lungs every 6 (six) hours as needed for wheezing or shortness of breath. 1 Inhaler 2  . atorvastatin (LIPITOR) 20 MG tablet Take 20 mg by mouth daily at 6 PM.    . gabapentin (NEURONTIN) 300 MG capsule Take  1 capsule (300 mg total) by mouth at bedtime. 30 capsule 0  . insulin glargine (LANTUS) 100 UNIT/ML injection Inject 60 Units into the skin at bedtime.    Marland Kitchen levothyroxine (SYNTHROID) 100 MCG tablet Take 1 tablet (100 mcg total) by mouth daily  before breakfast. 30 tablet 2  . lidocaine-prilocaine (EMLA) cream Apply to affected area once 30 g 6  . linagliptin (TRADJENTA) 5 MG TABS tablet Take 5 mg by mouth daily.     Marland Kitchen lisinopril (PRINIVIL,ZESTRIL) 10 MG tablet Take 10 mg by mouth daily.    Marland Kitchen oxyCODONE (OXY IR/ROXICODONE) 5 MG immediate release tablet Take 1 tablet (5 mg total) by mouth every 6 (six) hours as needed for severe pain. 30 tablet 0  . traMADol (ULTRAM) 50 MG tablet Take 1 tablet (50 mg total) by mouth 2 (two) times daily. 60 tablet 2   No current facility-administered medications for this visit.    Facility-Administered Medications Ordered in Other Visits  Medication Dose Route Frequency Provider Last Rate Last Dose  . 0.9 %  sodium chloride infusion   Intravenous Once [name], MD      . heparin lock flush 100 unit/mL  500 Units Intravenous Once [name], MD      . heparin lock flush 100 unit/mL  500 Units Intracatheter Once PRN [name], MD      . nivolumab (OPDIVO) 240 mg in sodium chloride 0.9 % 100 mL chemo infusion  240 mg Intravenous Once [name], MD      . sodium chloride flush (NS) 0.9 % injection 10 mL  10 mL Intracatheter PRN [name], MD        OBJECTIVE: Vitals:   09/25/17 0936  BP: (!) 160/88  Pulse: 83  Resp: 18  Temp: (!) 96.7 F (35.9 C)     Body mass index is 41.16 kg/m.    ECOG FS:1 - Symptomatic but completely ambulatory  General: Well-developed, well-nourished, no acute distress. Eyes: Pink conjunctiva, anicteric sclera. Lungs: Clear to auscultation bilaterally. Heart: Regular rate and rhythm. No rubs, murmurs, or gallops. Abdomen: Soft, nontender, nondistended. No organomegaly noted, normoactive bowel sounds. Musculoskeletal: No edema, cyanosis, or clubbing. Neuro: Alert, answering all questions appropriately. Cranial nerves grossly intact. Skin: No rashes or petechiae noted. Psych: Normal affect.   LAB RESULTS:  Lab Results    Component Value Date   NA 137 09/25/2017   K 4.2 09/25/2017   CL 103 09/25/2017   CO2 26 09/25/2017   GLUCOSE 233 (H) 09/25/2017   BUN 14 09/25/2017   CREATININE 0.77 09/25/2017   CALCIUM 9.1 09/25/2017   PROT 7.0 09/25/2017   ALBUMIN 3.7 09/25/2017   AST 21 09/25/2017   ALT 18 09/25/2017   ALKPHOS 85 09/25/2017   BILITOT 0.6 09/25/2017   GFRNONAA >60 09/25/2017   GFRAA >60 09/25/2017    Lab Results  Component Value Date   WBC 3.5 (L) 09/25/2017   NEUTROABS 2.3 09/25/2017   HGB 11.1 (L) 09/25/2017   HCT 33.0 (L) 09/25/2017   MCV 88.4 09/25/2017   PLT 134 (L) 09/25/2017   Lab Results  Component Value Date   IRON 42 07/31/2017   TIBC 280 07/31/2017   IRONPCTSAT 15 07/31/2017    Lab Results  Component Value Date   FERRITIN 81 07/31/2017     STUDIES: No results found.  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 She then underwent 2 cycles of consolidation carboplatinum and Taxol completing on Apr 27, 2016.  CT scan results from Apr 18, 2017 reviewed independently with progression of disease. Patient has now completed additional XRT. PET scan results from August 25, 2017 reviewed independently with improvement of disease burden. Will reimage in January 2019. Proceed with cycle 10 of nivolumab today. Return to clinic in 2 weeks for consideration of cycle 11.  2. Headaches/dizziness: MRI of the brain reviewed independently with no obvious metastatic disease. 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 Okay to proceed from an oncology standpoint. Patient states she has postponed her appointments with neurosurgery until she can resolve a personal matter involving her son and daughter-in-law. 3. Anemia: Improving. Previously iron stores  were within normal limits, monitor.  4. Thrombocytopenia: Mild, monitor.  5. Hyperglycemia: Continue diabetic medications as prescribed. Patient has been instructed to follow-up with her primary care physician to help improve her glycemic control. 6. Pain: Patient does not complain of this today. Continue oxycodone as prescribed. 7. Thyroid: Patient's symptoms have significantly improved since initiating 100 g Synthroid daily. Thyroid panel from today is pending. Patient rescheduled her endocrinology appointment last week, but has been instructed to keep this appointment for further assistance not only with her Synthroid dosing, but possibly with her diabetic monitoring.   Patient expressed understanding and was in agreement with this plan. She also understands that She can call clinic at any time with any questions, concerns, or complaints.    [name], MD   09/25/2017 10:06 AM

## 2017-09-25 ENCOUNTER — Inpatient Hospital Stay (HOSPITAL_BASED_OUTPATIENT_CLINIC_OR_DEPARTMENT_OTHER): Payer: PPO | Admitting: Oncology

## 2017-09-25 ENCOUNTER — Inpatient Hospital Stay: Payer: PPO

## 2017-09-25 ENCOUNTER — Encounter (INDEPENDENT_AMBULATORY_CARE_PROVIDER_SITE_OTHER): Payer: Self-pay

## 2017-09-25 VITALS — BP 160/88 | HR 83 | Temp 96.7°F | Resp 18 | Wt 255.0 lb

## 2017-09-25 DIAGNOSIS — E039 Hypothyroidism, unspecified: Secondary | ICD-10-CM

## 2017-09-25 DIAGNOSIS — C3412 Malignant neoplasm of upper lobe, left bronchus or lung: Secondary | ICD-10-CM

## 2017-09-25 DIAGNOSIS — D649 Anemia, unspecified: Secondary | ICD-10-CM

## 2017-09-25 DIAGNOSIS — Z5112 Encounter for antineoplastic immunotherapy: Secondary | ICD-10-CM | POA: Diagnosis not present

## 2017-09-25 DIAGNOSIS — Z923 Personal history of irradiation: Secondary | ICD-10-CM

## 2017-09-25 DIAGNOSIS — Z9221 Personal history of antineoplastic chemotherapy: Secondary | ICD-10-CM

## 2017-09-25 DIAGNOSIS — D696 Thrombocytopenia, unspecified: Secondary | ICD-10-CM | POA: Diagnosis not present

## 2017-09-25 DIAGNOSIS — Z79899 Other long term (current) drug therapy: Secondary | ICD-10-CM | POA: Diagnosis not present

## 2017-09-25 DIAGNOSIS — F17210 Nicotine dependence, cigarettes, uncomplicated: Secondary | ICD-10-CM | POA: Diagnosis not present

## 2017-09-25 LAB — COMPREHENSIVE METABOLIC PANEL
ALBUMIN: 3.7 g/dL (ref 3.5–5.0)
ALT: 18 U/L (ref 14–54)
ANION GAP: 8 (ref 5–15)
AST: 21 U/L (ref 15–41)
Alkaline Phosphatase: 85 U/L (ref 38–126)
BUN: 14 mg/dL (ref 6–20)
CO2: 26 mmol/L (ref 22–32)
Calcium: 9.1 mg/dL (ref 8.9–10.3)
Chloride: 103 mmol/L (ref 101–111)
Creatinine, Ser: 0.77 mg/dL (ref 0.44–1.00)
GFR calc Af Amer: >60 mL/min (ref >60)
GFR calc non Af Amer: >60 mL/min (ref >60)
GLUCOSE: 233 mg/dL — AB (ref 65–99)
POTASSIUM: 4.2 mmol/L (ref 3.5–5.1)
SODIUM: 137 mmol/L (ref 135–145)
Total Bilirubin: 0.6 mg/dL (ref 0.3–1.2)
Total Protein: 7 g/dL (ref 6.5–8.1)

## 2017-09-25 LAB — CBC WITH DIFFERENTIAL/PLATELET
Basophils Absolute: 0 10*3/uL (ref 0–0.1)
Basophils Relative: 1 %
EOS PCT: 3 %
Eosinophils Absolute: 0.1 10*3/uL (ref 0–0.7)
HCT: 33 % — ABNORMAL LOW (ref 35.0–47.0)
Hemoglobin: 11.1 g/dL — ABNORMAL LOW (ref 12.0–16.0)
LYMPHS ABS: 0.9 10*3/uL — AB (ref 1.0–3.6)
LYMPHS PCT: 25 %
MCH: 29.7 pg (ref 26.0–34.0)
MCHC: 33.7 g/dL (ref 32.0–36.0)
MCV: 88.4 fL (ref 80.0–100.0)
MONOS PCT: 7 %
Monocytes Absolute: 0.3 10*3/uL (ref 0.2–0.9)
Neutro Abs: 2.3 10*3/uL (ref 1.4–6.5)
Neutrophils Relative %: 64 %
Platelets: 134 10*3/uL — ABNORMAL LOW (ref 150–440)
RBC: 3.74 MIL/uL — AB (ref 3.80–5.20)
RDW: 17.3 % — AB (ref 11.5–14.5)
WBC: 3.5 10*3/uL — AB (ref 3.6–11.0)

## 2017-09-25 MED ORDER — HEPARIN SOD (PORK) LOCK FLUSH 100 UNIT/ML IV SOLN
500.0000 [IU] | Freq: Once | INTRAVENOUS | Status: AC
  Administered 2017-09-25: 500 [IU] via INTRAVENOUS
  Filled 2017-09-25: qty 5

## 2017-09-25 MED ORDER — SODIUM CHLORIDE 0.9% FLUSH
10.0000 mL | Freq: Once | INTRAVENOUS | Status: AC
  Administered 2017-09-25: 10 mL via INTRAVENOUS
  Filled 2017-09-25: qty 10

## 2017-09-25 MED ORDER — SODIUM CHLORIDE 0.9% FLUSH
10.0000 mL | INTRAVENOUS | Status: DC | PRN
  Filled 2017-09-25: qty 10

## 2017-09-25 MED ORDER — HEPARIN SOD (PORK) LOCK FLUSH 100 UNIT/ML IV SOLN: 500.0000 [IU] | Freq: Once | INTRAVENOUS | Status: DC | PRN

## 2017-09-25 MED ORDER — SODIUM CHLORIDE 0.9 % IV SOLN
Freq: Once | INTRAVENOUS | Status: AC
  Administered 2017-09-25: 10:00:00 via INTRAVENOUS
  Filled 2017-09-25: qty 1000

## 2017-09-25 MED ORDER — SODIUM CHLORIDE 0.9 % IV SOLN
240.0000 mg | Freq: Once | INTRAVENOUS | Status: AC
  Administered 2017-09-25: 240 mg via INTRAVENOUS
  Filled 2017-09-25: qty 24

## 2017-09-25 NOTE — Progress Notes (Signed)
Patient reports she is feeling much better since starting Synthroid, has appt scheduled for 11/9 with Endocrinology.

## 2017-09-26 LAB — THYROID PANEL WITH TSH
FREE THYROXINE INDEX: 1.2 (ref 1.2–4.9)
T3 Uptake Ratio: 24 % (ref 24–39)
T4, Total: 5.2 ug/dL (ref 4.5–12.0)
TSH: 59.63 u[IU]/mL — ABNORMAL HIGH (ref 0.450–4.500)

## 2017-10-04 NOTE — Progress Notes (Signed)
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  INTERVAL HISTORY: Patient returns to clinic today for further evaluation and reconsideration of cycle 11 of nivolumab. Since initiating Synthroid she feels significantly improved and nearly back to her baseline. Her appetite has improved as well.  She has persistent weakness and fatigue and is now complaining of worsening memory.  She continues to have occasional dizziness, but no other neurologic complaints. She does not complain of peripheral neuropathy today. She denies any recent fevers or illnesses. She denies any chest pain, cough, hemoptysis, or shortness of breath. She denies nausea, constipation, diarrhea, nausea, or vomiting.  She has no urinary complaints. Patient offers no further specific complaints today.  REVIEW OF SYSTEMS:   Review of Systems  Constitutional: Positive for malaise/fatigue. Negative for fever and weight loss.  Respiratory: Negative.  Negative for cough, hemoptysis and shortness of breath.   Cardiovascular: Negative.  Negative for chest pain and leg swelling.  Gastrointestinal: Negative.  Negative for abdominal pain.  Genitourinary: Negative for flank pain.  Musculoskeletal: Negative.  Negative for joint pain and myalgias.  Skin: Negative.  Negative for rash.  Neurological: Positive for dizziness and weakness. Negative for sensory change and headaches.  Psychiatric/Behavioral: Positive for memory loss. The patient is not  nervous/anxious.     As per HPI. Otherwise, a complete review of systems is negative.  PAST MEDICAL HISTORY: Past Medical History:  Diagnosis Date  . Arthritis   . Blood dyscrasia   . Cancer of left lung ([place]) 08/2015   Rad + chemo tx's.  . Diabetes mellitus without complication ([place])   . Diabetic neuropathy ([place])   . High cholesterol   . Hypertension     PAST SURGICAL HISTORY: Past Surgical History:  Procedure Laterality Date  . ABDOMINAL HYSTERECTOMY    . CESAREAN SECTION      FAMILY HISTORY: Reviewed and unchanged. No reported history of malignancy or chronic disease.     ADVANCED DIRECTIVES:    HEALTH MAINTENANCE: Social History   Tobacco Use  . Smoking status: Current Every Day Smoker    Packs/day: 0.30    Years: 35.00    Pack years: 10.50    Types: Cigarettes  . Smokeless tobacco: Never Used  . Tobacco comment: workiing on quitting, down to 2 packs per week now  Substance Use Topics  . Alcohol use: No    Alcohol/week: 0.0 oz  . Drug use: No     No Known Allergies  Current Outpatient Medications  Medication Sig Dispense Refill  . albuterol (PROVENTIL HFA;VENTOLIN HFA) 108 (90 Base) MCG/ACT inhaler Inhale 2 puffs into the lungs every 6 (six) hours as needed for wheezing or shortness of breath. 1 Inhaler 2  . atorvastatin (LIPITOR) 20 MG tablet Take 20 mg by mouth daily at 6 PM.    . gabapentin (NEURONTIN) 300 MG capsule Take 1 capsule (300 mg total) at bedtime by mouth. 30 capsule 2  . insulin glargine (LANTUS) 100 UNIT/ML injection Inject 60 Units into the skin at bedtime.    Marland Kitchen levothyroxine (SYNTHROID) 100 MCG  tablet Take 1 tablet (100 mcg total) daily before breakfast by mouth. 30 tablet 2  . lidocaine-prilocaine (EMLA) cream Apply to affected area once 30 g 6  . linagliptin (TRADJENTA) 5 MG TABS tablet Take 5 mg by mouth daily.     Marland Kitchen lisinopril (PRINIVIL,ZESTRIL) 10 MG tablet Take 10 mg by mouth daily.    Marland Kitchen oxyCODONE (OXY IR/ROXICODONE) 5 MG  immediate release tablet Take 1 tablet (5 mg total) every 6 (six) hours as needed by mouth for severe pain. 30 tablet 0   No current facility-administered medications for this visit.    Facility-Administered Medications Ordered in Other Visits  Medication Dose Route Frequency Provider Last Rate Last Dose  . heparin lock flush 100 unit/mL  500 Units Intravenous Once [name], MD      . nivolumab (OPDIVO) 240 mg in sodium chloride 0.9 % 100 mL chemo infusion  240 mg Intravenous Once [name], MD 248 mL/hr at 10/09/17 1020 240 mg at 10/09/17 1020  . sodium chloride flush (NS) 0.9 % injection 10 mL  10 mL Intravenous PRN [name], MD   10 mL at 10/09/17 0835    OBJECTIVE: Vitals:   10/09/17 0913  BP: 137/86  Pulse: 84  Resp: 20  Temp: (!) 96 F (35.6 C)     Body mass index is 41.06 kg/m.    ECOG FS:1 - Symptomatic but completely ambulatory  General: Well-developed, well-nourished, no acute distress. Eyes: Pink conjunctiva, anicteric sclera. Lungs: Clear to auscultation bilaterally. Heart: Regular rate and rhythm. No rubs, murmurs, or gallops. Abdomen: Soft, nontender, nondistended. No organomegaly noted, normoactive bowel sounds. Musculoskeletal: No edema, cyanosis, or clubbing. Neuro: Alert, answering all questions appropriately. Cranial nerves grossly intact. Skin: No rashes or petechiae noted. Psych: Normal affect.   LAB RESULTS:  Lab Results  Component Value Date   NA 138 10/09/2017   K 4.3 10/09/2017   CL 106 10/09/2017   CO2 26 10/09/2017   GLUCOSE 252 (H) 10/09/2017   BUN 24 (H) 10/09/2017   CREATININE 0.87 10/09/2017   CALCIUM 9.3 10/09/2017   PROT 7.0 10/09/2017   ALBUMIN 3.7 10/09/2017   AST 21 10/09/2017   ALT 17 10/09/2017   ALKPHOS 84 10/09/2017   BILITOT 0.5 10/09/2017   GFRNONAA >60 10/09/2017   GFRAA >60 10/09/2017    Lab Results  Component Value Date   WBC 3.9 10/09/2017   NEUTROABS 2.4 10/09/2017   HGB 10.8 (L)  10/09/2017   HCT 32.1 (L) 10/09/2017   MCV 89.2 10/09/2017   PLT 132 (L) 10/09/2017   Lab Results  Component Value Date   IRON 42 07/31/2017   TIBC 280 07/31/2017   IRONPCTSAT 15 07/31/2017    Lab Results  Component Value Date   FERRITIN 81 07/31/2017     STUDIES: No results found.  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 She then underwent 2 cycles of consolidation carboplatinum and Taxol completing on Apr 27, 2016.  CT scan results from Apr 18, 2017 reviewed independently with progression of disease. Patient has now completed additional XRT. PET scan results from August 25, 2017 reviewed independently with improvement of disease burden. Will reimage in January 2019. Proceed with cycle 11 of nivolumab today. Return to clinic in  2 weeks for consideration of cycle 12.  2. Headaches/dizziness: MRI of the brain reviewed independently with no obvious metastatic disease. 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 Okay to proceed from an oncology standpoint. Patient states she has postponed her appointments with neurosurgery until she can resolve a personal matter involving her son and daughter-in-law. 3. Anemia: Improving. Previously iron stores were within normal limits, monitor.  4. Thrombocytopenia: Mild, monitor.  5. Hyperglycemia: Continue diabetic medications as prescribed. Patient has been instructed to follow-up with her primary care physician to help improve her glycemic control. 6. Pain: Patient does not complain of this today. Continue oxycodone as prescribed. 7. Thyroid: Patient's symptoms have significantly improved since initiating 100 g Synthroid daily. Thyroid panel from today is pending. Patient has an appointment with  endocrinology on October 19, 2017.  She has been instructed to keep this appointment for further assistance not only with her Synthroid dosing, but possibly with her diabetic monitoring.   Patient expressed understanding and was in agreement with this plan. She also understands that She can call clinic at any time with any questions, concerns, or complaints.    [name], MD   10/09/2017 10:25 AM

## 2017-10-09 ENCOUNTER — Inpatient Hospital Stay (HOSPITAL_BASED_OUTPATIENT_CLINIC_OR_DEPARTMENT_OTHER): Payer: PPO | Admitting: Oncology

## 2017-10-09 ENCOUNTER — Encounter: Payer: Self-pay | Admitting: Oncology

## 2017-10-09 ENCOUNTER — Inpatient Hospital Stay: Payer: PPO

## 2017-10-09 ENCOUNTER — Inpatient Hospital Stay: Payer: PPO | Attending: Oncology

## 2017-10-09 VITALS — BP 137/86 | HR 84 | Temp 96.0°F | Resp 20 | Wt 254.4 lb

## 2017-10-09 DIAGNOSIS — R51 Headache: Secondary | ICD-10-CM | POA: Insufficient documentation

## 2017-10-09 DIAGNOSIS — Z5111 Encounter for antineoplastic chemotherapy: Secondary | ICD-10-CM | POA: Diagnosis not present

## 2017-10-09 DIAGNOSIS — E7800 Pure hypercholesterolemia, unspecified: Secondary | ICD-10-CM | POA: Insufficient documentation

## 2017-10-09 DIAGNOSIS — D696 Thrombocytopenia, unspecified: Secondary | ICD-10-CM

## 2017-10-09 DIAGNOSIS — C3412 Malignant neoplasm of upper lobe, left bronchus or lung: Secondary | ICD-10-CM

## 2017-10-09 DIAGNOSIS — E1165 Type 2 diabetes mellitus with hyperglycemia: Secondary | ICD-10-CM | POA: Diagnosis not present

## 2017-10-09 DIAGNOSIS — Z79899 Other long term (current) drug therapy: Secondary | ICD-10-CM

## 2017-10-09 DIAGNOSIS — F17210 Nicotine dependence, cigarettes, uncomplicated: Secondary | ICD-10-CM | POA: Insufficient documentation

## 2017-10-09 DIAGNOSIS — D649 Anemia, unspecified: Secondary | ICD-10-CM | POA: Diagnosis not present

## 2017-10-09 DIAGNOSIS — R42 Dizziness and giddiness: Secondary | ICD-10-CM

## 2017-10-09 DIAGNOSIS — G620 Drug-induced polyneuropathy: Secondary | ICD-10-CM

## 2017-10-09 DIAGNOSIS — Z794 Long term (current) use of insulin: Secondary | ICD-10-CM | POA: Insufficient documentation

## 2017-10-09 DIAGNOSIS — T451X5A Adverse effect of antineoplastic and immunosuppressive drugs, initial encounter: Secondary | ICD-10-CM

## 2017-10-09 DIAGNOSIS — I10 Essential (primary) hypertension: Secondary | ICD-10-CM | POA: Diagnosis not present

## 2017-10-09 LAB — CBC WITH DIFFERENTIAL/PLATELET
BASOS PCT: 1 %
Basophils Absolute: 0 10*3/uL (ref 0–0.1)
EOS ABS: 0.1 10*3/uL (ref 0–0.7)
Eosinophils Relative: 4 %
HCT: 32.1 % — ABNORMAL LOW (ref 35.0–47.0)
HEMOGLOBIN: 10.8 g/dL — AB (ref 12.0–16.0)
LYMPHS ABS: 1.1 10*3/uL (ref 1.0–3.6)
Lymphocytes Relative: 27 %
MCH: 30.1 pg (ref 26.0–34.0)
MCHC: 33.7 g/dL (ref 32.0–36.0)
MCV: 89.2 fL (ref 80.0–100.0)
MONO ABS: 0.3 10*3/uL (ref 0.2–0.9)
MONOS PCT: 8 %
Neutro Abs: 2.4 10*3/uL (ref 1.4–6.5)
Neutrophils Relative %: 60 %
Platelets: 132 10*3/uL — ABNORMAL LOW (ref 150–440)
RBC: 3.6 MIL/uL — ABNORMAL LOW (ref 3.80–5.20)
RDW: 16.8 % — AB (ref 11.5–14.5)
WBC: 3.9 10*3/uL (ref 3.6–11.0)

## 2017-10-09 LAB — COMPREHENSIVE METABOLIC PANEL
ALBUMIN: 3.7 g/dL (ref 3.5–5.0)
ALK PHOS: 84 U/L (ref 38–126)
ALT: 17 U/L (ref 14–54)
AST: 21 U/L (ref 15–41)
Anion gap: 6 (ref 5–15)
BUN: 24 mg/dL — AB (ref 6–20)
CALCIUM: 9.3 mg/dL (ref 8.9–10.3)
CHLORIDE: 106 mmol/L (ref 101–111)
CO2: 26 mmol/L (ref 22–32)
CREATININE: 0.87 mg/dL (ref 0.44–1.00)
GFR calc Af Amer: >60 mL/min (ref >60)
GFR calc non Af Amer: >60 mL/min (ref >60)
GLUCOSE: 252 mg/dL — AB (ref 65–99)
Potassium: 4.3 mmol/L (ref 3.5–5.1)
SODIUM: 138 mmol/L (ref 135–145)
Total Bilirubin: 0.5 mg/dL (ref 0.3–1.2)
Total Protein: 7 g/dL (ref 6.5–8.1)

## 2017-10-09 MED ORDER — SODIUM CHLORIDE 0.9% FLUSH
10.0000 mL | INTRAVENOUS | Status: DC | PRN
  Administered 2017-10-09: 10 mL via INTRAVENOUS
  Filled 2017-10-09: qty 10

## 2017-10-09 MED ORDER — SODIUM CHLORIDE 0.9 % IV SOLN
Freq: Once | INTRAVENOUS | Status: AC
  Administered 2017-10-09: 10:00:00 via INTRAVENOUS
  Filled 2017-10-09: qty 1000

## 2017-10-09 MED ORDER — HEPARIN SOD (PORK) LOCK FLUSH 100 UNIT/ML IV SOLN
500.0000 [IU] | Freq: Once | INTRAVENOUS | Status: AC
  Administered 2017-10-09: 500 [IU] via INTRAVENOUS
  Filled 2017-10-09: qty 5

## 2017-10-09 MED ORDER — SODIUM CHLORIDE 0.9 % IV SOLN
240.0000 mg | Freq: Once | INTRAVENOUS | Status: AC
  Administered 2017-10-09: 240 mg via INTRAVENOUS
  Filled 2017-10-09: qty 24

## 2017-10-09 MED ORDER — OXYCODONE HCL 5 MG PO TABS: 5.0000 mg | ORAL_TABLET | Freq: Four times a day (QID) | ORAL | 0 refills | Status: DC | PRN

## 2017-10-09 MED ORDER — GABAPENTIN 300 MG PO CAPS: 300.0000 mg | ORAL_CAPSULE | Freq: Every day | ORAL | 2 refills | Status: DC

## 2017-10-09 MED ORDER — LEVOTHYROXINE SODIUM 100 MCG PO TABS: 100.0000 ug | ORAL_TABLET | Freq: Every day | ORAL | 2 refills | Status: DC

## 2017-10-09 NOTE — Progress Notes (Signed)
Patient reports fatigue today.  

## 2017-10-10 LAB — THYROID PANEL WITH TSH
FREE THYROXINE INDEX: 1.1 — AB (ref 1.2–4.9)
T3 Uptake Ratio: 25 % (ref 24–39)
T4, Total: 4.3 ug/dL — ABNORMAL LOW (ref 4.5–12.0)
TSH: 56.99 u[IU]/mL — ABNORMAL HIGH (ref 0.450–4.500)

## 2017-10-19 DIAGNOSIS — E1165 Type 2 diabetes mellitus with hyperglycemia: Secondary | ICD-10-CM | POA: Diagnosis not present

## 2017-10-19 DIAGNOSIS — E039 Hypothyroidism, unspecified: Secondary | ICD-10-CM | POA: Insufficient documentation

## 2017-10-19 DIAGNOSIS — Z794 Long term (current) use of insulin: Secondary | ICD-10-CM | POA: Diagnosis not present

## 2017-10-21 NOTE — Progress Notes (Signed)
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  INTERVAL HISTORY: Patient returns to clinic today for further evaluation and consideration of cycle 12 of nivolumab.  Patient discontinued her gabapentin earlier this week and since then has felt "bad".  She continues to have persistent weakness and fatigue. She continues to have occasional dizziness, but no other neurologic complaints. She does not complain of peripheral neuropathy today. She denies any recent fevers or illnesses. She denies any chest pain, cough, hemoptysis, or shortness of breath. She denies nausea, constipation, diarrhea, nausea, or vomiting.  She has no urinary complaints. Patient offers no further specific complaints today.  REVIEW OF SYSTEMS:   Review of Systems  Constitutional: Positive for malaise/fatigue. Negative for fever and weight loss.  Respiratory: Negative.  Negative for cough, hemoptysis and shortness of breath.   Cardiovascular: Negative.  Negative for chest pain and leg swelling.  Gastrointestinal: Negative.  Negative for abdominal pain.  Genitourinary: Negative for flank pain.  Musculoskeletal: Negative.  Negative for joint pain and myalgias.  Skin: Negative.  Negative for rash.  Neurological: Positive for dizziness and weakness. Negative for sensory change and headaches.  Psychiatric/Behavioral: Positive for memory loss. The patient is not nervous/anxious.     As per HPI. Otherwise, a complete review of systems is  negative.  PAST MEDICAL HISTORY: Past Medical History:  Diagnosis Date  . Arthritis   . Blood dyscrasia   . Cancer of left lung ([place]) 08/2015   Rad + chemo tx's.  . Diabetes mellitus without complication ([place])   . Diabetic neuropathy ([place])   . High cholesterol   . Hypertension     PAST SURGICAL HISTORY: Past Surgical History:  Procedure Laterality Date  . ABDOMINAL HYSTERECTOMY    . CESAREAN SECTION    . ELECTROMAGNETIC NAVIGATION BRONCHOSCOPY N/A 11/26/2015   Performed by Flora Lipps, MD at [facility] N/A 11/26/2015   Performed by Fl[facility] Lipps, MD at [facility]  . [place] Insertion N/A 12/09/2015   Performed by Delana Meyer Dolores Lory, MD at [facility]    FAMILY HISTORY: Reviewed and unchanged. No reported history of malignancy or chronic disease.     ADVANCED DIRECTIVES:    HEALTH MAINTENANCE: Social History   Tobacco Use  . Smoking status: Current Every Day Smoker    Packs/day: 0.30    Years: 35.00    Pack years: 10.50    Types: Cigarettes  . Smokeless tobacco: Never Used  . Tobacco comment: workiing on quitting, down to 2 packs per week now  Substance Use Topics  . Alcohol use: No    Alcohol/week: 0.0 oz  . Drug use: No     No Known Allergies  Current Outpatient Medications  Medication Sig Dispense Refill  . albuterol (PROVENTIL HFA;VENTOLIN HFA) 108 (90 Base) MCG/ACT inhaler Inhale 2 puffs into the lungs every 6 (six) hours as needed for wheezing or shortness of breath. 1 Inhaler 2  . atorvastatin (LIPITOR) 20 MG tablet Take 20 mg by mouth daily at 6 PM.    .  gabapentin (NEURONTIN) 300 MG capsule Take 1 capsule (300 mg total) at bedtime by mouth. 30 capsule 2  . insulin glargine (LANTUS) 100 UNIT/ML injection Inject 60 Units into the skin at bedtime.    Marland Kitchen levothyroxine (SYNTHROID) 100 MCG tablet Take 1 tablet (100 mcg total) daily before breakfast by mouth. 30 tablet 2  . lidocaine-prilocaine (EMLA) cream Apply to  affected area once 30 g 6  . linagliptin (TRADJENTA) 5 MG TABS tablet Take 5 mg by mouth daily.     Marland Kitchen lisinopril (PRINIVIL,ZESTRIL) 10 MG tablet Take 10 mg by mouth daily.    Marland Kitchen oxyCODONE (OXY IR/ROXICODONE) 5 MG immediate release tablet Take 1 tablet (5 mg total) every 6 (six) hours as needed by mouth for severe pain. 30 tablet 0   No current facility-administered medications for this visit.    Facility-Administered Medications Ordered in Other Visits  Medication Dose Route Frequency Provider Last Rate Last Dose  . heparin lock flush 100 unit/mL  500 Units Intravenous Once [name], MD      . nivolumab (OPDIVO) 240 mg in sodium chloride 0.9 % 100 mL chemo infusion  240 mg Intravenous Once [name], MD      . sodium chloride flush (NS) 0.9 % injection 10 mL  10 mL Intravenous PRN [name], MD   10 mL at 10/23/17 0903    OBJECTIVE: Vitals:   10/23/17 0922  BP: 112/70  Pulse: 73  Resp: 18  Temp: (!) 97.5 F (36.4 C)  SpO2: 99%     Body mass index is 41.61 kg/m.    ECOG FS:1 - Symptomatic but completely ambulatory  General: Well-developed, well-nourished, no acute distress. Eyes: Pink conjunctiva, anicteric sclera. Lungs: Clear to auscultation bilaterally. Heart: Regular rate and rhythm. No rubs, murmurs, or gallops. Abdomen: Soft, nontender, nondistended. No organomegaly noted, normoactive bowel sounds. Musculoskeletal: No edema, cyanosis, or clubbing. Neuro: Alert, answering all questions appropriately. Cranial nerves grossly intact. Skin: No rashes or petechiae noted. Psych: Normal affect.   LAB RESULTS:  Lab Results  Component Value Date   NA 134 (L) 10/23/2017   K 4.4 10/23/2017   CL 102 10/23/2017   CO2 26 10/23/2017   GLUCOSE 200 (H) 10/23/2017   BUN 14 10/23/2017   CREATININE 0.85 10/23/2017   CALCIUM 9.3 10/23/2017   PROT 6.8 10/23/2017   ALBUMIN 3.5 10/23/2017   AST 21 10/23/2017   ALT 17 10/23/2017   ALKPHOS 81 10/23/2017    BILITOT 0.4 10/23/2017   GFRNONAA >60 10/23/2017   GFRAA >60 10/23/2017    Lab Results  Component Value Date   WBC 4.0 10/23/2017   NEUTROABS 2.4 10/23/2017   HGB 10.9 (L) 10/23/2017   HCT 32.4 (L) 10/23/2017   MCV 89.3 10/23/2017   PLT 148 (L) 10/23/2017   Lab Results  Component Value Date   IRON 42 07/31/2017   TIBC 280 07/31/2017   IRONPCTSAT 15 07/31/2017    Lab Results  Component Value Date   FERRITIN 81 07/31/2017     STUDIES: No results found.  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 She then underwent 2 cycles of consolidation carboplatinum and Taxol completing on Apr 27, 2016.  CT scan results from Apr 18, 2017 reviewed independently with  progression of disease. Patient has now completed additional XRT. PET scan results from August 25, 2017 reviewed independently with improvement of disease burden. Will reimage in January 2019. Proceed with cycle 12 of nivolumab today. Return to clinic in 2 weeks for consideration of cycle 13.  2. Headaches/dizziness: MRI of the brain reviewed independently with no obvious metastatic disease. 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 Okay to proceed from an oncology standpoint. Patient states she has postponed her appointments with neurosurgery until she can visit her new grandson in [place]. 3. Anemia: Improving. Previously iron stores were within normal limits, monitor.  4. Thrombocytopenia: Mild, monitor.  5. Hyperglycemia: Continue diabetic medications as prescribed.  Appreciate endocrinology input.   6. Pain: Patient does not complain of this today. Continue oxycodone as prescribed.  Patient has been instructed to reinitiate her gabapentin and further discuss with her pharmacy since she  was confused by receiving a different color capsule. 7. Thyroid: Patient's symptoms have significantly improved since initiating 100 g Synthroid daily.  Appreciate endocrinology input who will now be monitoring and adjusting her medications as needed.    Patient expressed understanding and was in agreement with this plan. She also understands that She can call clinic at any time with any questions, concerns, or complaints.    [name], MD   10/23/2017 10:08 AM

## 2017-10-23 ENCOUNTER — Inpatient Hospital Stay: Payer: PPO

## 2017-10-23 ENCOUNTER — Inpatient Hospital Stay (HOSPITAL_BASED_OUTPATIENT_CLINIC_OR_DEPARTMENT_OTHER): Payer: PPO | Admitting: Oncology

## 2017-10-23 VITALS — BP 112/70 | HR 73 | Temp 97.5°F | Resp 18 | Wt 257.8 lb

## 2017-10-23 DIAGNOSIS — Z79899 Other long term (current) drug therapy: Secondary | ICD-10-CM

## 2017-10-23 DIAGNOSIS — R51 Headache: Secondary | ICD-10-CM | POA: Diagnosis not present

## 2017-10-23 DIAGNOSIS — R42 Dizziness and giddiness: Secondary | ICD-10-CM

## 2017-10-23 DIAGNOSIS — F17210 Nicotine dependence, cigarettes, uncomplicated: Secondary | ICD-10-CM

## 2017-10-23 DIAGNOSIS — D696 Thrombocytopenia, unspecified: Secondary | ICD-10-CM

## 2017-10-23 DIAGNOSIS — E1165 Type 2 diabetes mellitus with hyperglycemia: Secondary | ICD-10-CM | POA: Diagnosis not present

## 2017-10-23 DIAGNOSIS — Z5111 Encounter for antineoplastic chemotherapy: Secondary | ICD-10-CM | POA: Diagnosis not present

## 2017-10-23 DIAGNOSIS — C3412 Malignant neoplasm of upper lobe, left bronchus or lung: Secondary | ICD-10-CM

## 2017-10-23 DIAGNOSIS — D649 Anemia, unspecified: Secondary | ICD-10-CM

## 2017-10-23 LAB — CBC WITH DIFFERENTIAL/PLATELET
BASOS ABS: 0 10*3/uL (ref 0–0.1)
Basophils Relative: 1 %
EOS PCT: 5 %
Eosinophils Absolute: 0.2 10*3/uL (ref 0–0.7)
HEMATOCRIT: 32.4 % — AB (ref 35.0–47.0)
Hemoglobin: 10.9 g/dL — ABNORMAL LOW (ref 12.0–16.0)
LYMPHS PCT: 28 %
Lymphs Abs: 1.1 10*3/uL (ref 1.0–3.6)
MCH: 30.1 pg (ref 26.0–34.0)
MCHC: 33.7 g/dL (ref 32.0–36.0)
MCV: 89.3 fL (ref 80.0–100.0)
MONO ABS: 0.3 10*3/uL (ref 0.2–0.9)
Monocytes Relative: 8 %
Neutro Abs: 2.4 10*3/uL (ref 1.4–6.5)
Neutrophils Relative %: 58 %
PLATELETS: 148 10*3/uL — AB (ref 150–440)
RBC: 3.62 MIL/uL — ABNORMAL LOW (ref 3.80–5.20)
RDW: 16.3 % — AB (ref 11.5–14.5)
WBC: 4 10*3/uL (ref 3.6–11.0)

## 2017-10-23 LAB — COMPREHENSIVE METABOLIC PANEL
ALBUMIN: 3.5 g/dL (ref 3.5–5.0)
ALK PHOS: 81 U/L (ref 38–126)
ALT: 17 U/L (ref 14–54)
AST: 21 U/L (ref 15–41)
Anion gap: 6 (ref 5–15)
BILIRUBIN TOTAL: 0.4 mg/dL (ref 0.3–1.2)
BUN: 14 mg/dL (ref 6–20)
CALCIUM: 9.3 mg/dL (ref 8.9–10.3)
CO2: 26 mmol/L (ref 22–32)
CREATININE: 0.85 mg/dL (ref 0.44–1.00)
Chloride: 102 mmol/L (ref 101–111)
GFR calc Af Amer: >60 mL/min (ref >60)
GFR calc non Af Amer: >60 mL/min (ref >60)
GLUCOSE: 200 mg/dL — AB (ref 65–99)
POTASSIUM: 4.4 mmol/L (ref 3.5–5.1)
Sodium: 134 mmol/L — ABNORMAL LOW (ref 135–145)
TOTAL PROTEIN: 6.8 g/dL (ref 6.5–8.1)

## 2017-10-23 MED ORDER — SODIUM CHLORIDE 0.9 % IV SOLN
240.0000 mg | Freq: Once | INTRAVENOUS | Status: AC
  Administered 2017-10-23: 240 mg via INTRAVENOUS
  Filled 2017-10-23: qty 24

## 2017-10-23 MED ORDER — HEPARIN SOD (PORK) LOCK FLUSH 100 UNIT/ML IV SOLN
500.0000 [IU] | Freq: Once | INTRAVENOUS | Status: AC
  Administered 2017-10-23: 500 [IU] via INTRAVENOUS
  Filled 2017-10-23: qty 5

## 2017-10-23 MED ORDER — SODIUM CHLORIDE 0.9 % IV SOLN
Freq: Once | INTRAVENOUS | Status: AC
  Administered 2017-10-23: 10:00:00 via INTRAVENOUS
  Filled 2017-10-23: qty 1000

## 2017-10-23 MED ORDER — SODIUM CHLORIDE 0.9% FLUSH
10.0000 mL | INTRAVENOUS | Status: DC | PRN
  Administered 2017-10-23: 10 mL via INTRAVENOUS
  Filled 2017-10-23: qty 10

## 2017-10-24 LAB — THYROID PANEL WITH TSH
FREE THYROXINE INDEX: 1 — AB (ref 1.2–4.9)
T3 UPTAKE RATIO: 25 % (ref 24–39)
T4, Total: 4 ug/dL — ABNORMAL LOW (ref 4.5–12.0)
TSH: 62.11 u[IU]/mL — ABNORMAL HIGH (ref 0.450–4.500)

## 2017-10-25 IMAGING — CT NM PET TUM IMG INITIAL (PI) SKULL BASE T - THIGH
1 of 10 series · 1 of 25 positions shown · non-contrast
Comparison: 11/16/2015 chest CT angiogram.

CLINICAL DATA: Initial treatment strategy for left upper lobe lung
nodule with bilateral hilar and mediastinal lymphadenopathy detected
on recent chest CT angiogram. The patient underwent bronchoscopic
biopsy 1 day prior, with pathology results pending.

EXAM:
NUCLEAR MEDICINE PET SKULL BASE TO THIGH
TECHNIQUE: 12.1 mCi F-18 FDG was injected intravenously. Full-ring PET imaging
was performed from the skull base to thigh after the radiotracer. CT
data was obtained and used for attenuation correction and anatomic
localization.
FASTING BLOOD GLUCOSE:  Value: 194 mg/dl

[Series 3: ct wb 5.0 b30f · axial · 5.0mm · 0.98mm/px · 1 of 290 slices shown]
[im 290/290  brain]
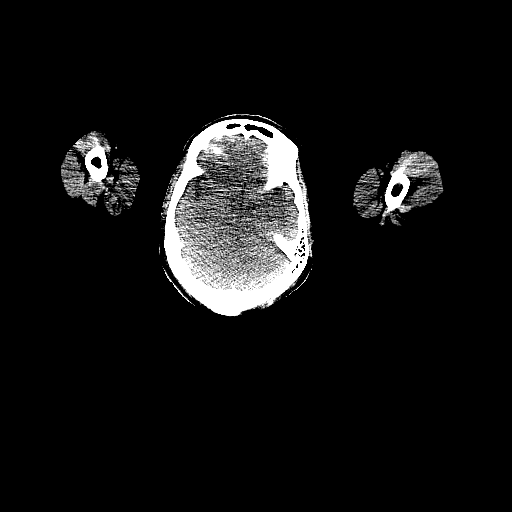

[1 of 25 positions shown; findings below may reference images not displayed]

FINDINGS: NECK

No hypermetabolic lymph nodes in the neck. There is an osteoma in
the right frontal sinus.

CHEST

There is a hypermetabolic 2.8 x 2.3 cm left upper lobe pulmonary
nodule (series 3/image 60) with max SUV 14.1.

There is bulky confluent ipsilateral peribronchial and ipsilateral
hilar lymphadenopathy, for example a large hypermetabolic 6.6 x
cm ipsilateral peribronchial node in the central medial left upper
lobe (series 3/image 73) with max SUV 11.2, which demonstrates a
long base of attachment to the left mediastinal pleural surface,
cannot exclude superficial left mediastinal invasion. There is a
separate hypermetabolic 3.3 cm ipsilateral peribronchial node in the
left upper lobe (series 3/image 74) with max SUV 16.9. There is a
2.4 cm hypermetabolic left hilar node (series 3/ image 83) with max
SUV 12.1. There is no hypermetabolic mediastinal or contralateral
hilar lymphadenopathy.

Re- demonstrated is extensive ground-glass centrilobular nodularity
throughout both lungs, upper lung predominant, with cavitary change
in several of the small nodules (better visualized on the 11/16/2015
chest CT angiogram study), not appreciably changed and without
associated hypermetabolism. No acute consolidative airspace disease
or new hypermetabolic pulmonary nodules.

No hypermetabolic axillary nodes. Heart appears borderline enlarged
and slightly increased in size in the interval. Left anterior
descending and right coronary atherosclerosis.

ABDOMEN/PELVIS

No abnormal hypermetabolic activity within the liver, pancreas,
adrenal glands, or spleen. No hypermetabolic lymph nodes in the
abdomen or pelvis. Scattered mild colonic diverticulosis. Status
post hysterectomy.

SKELETON

No focal hypermetabolic activity to suggest skeletal metastasis.
IMPRESSION: 1. Hypermetabolic 2.8 x 2.3 cm left upper lobe pulmonary nodule, in
keeping with a primary bronchogenic carcinoma.
2. Bulky hypermetabolic ipsilateral peribronchial and ipsilateral
hilar lymphadenopathy. Cannot exclude superficial invasion of the
left mediastinum by the dominant 6.6 cm ipsilateral peribronchial
nodal metastasis.
3. No hypermetabolic mediastinal or contralateral hilar
lymphadenopathy. No hypermetabolic extrathoracic metastatic disease.
4. Interval stability of extensive upper lung predominant
ground-glass centrilobular nodularity with variable cavitary change
within the nodules, better visualized on the recent chest CT
angiogram study, most suggestive of Langerhans cell histiocytosis as
suggested on the 11/16/2015 chest CT report.
5. Borderline cardiomegaly, increased in size. Coronary
atherosclerosis.

## 2017-11-05 ENCOUNTER — Other Ambulatory Visit: Payer: Self-pay | Admitting: Oncology

## 2017-11-05 NOTE — Progress Notes (Signed)
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  INTERVAL HISTORY: Patient returns to clinic today for further evaluation and consideration of cycle 13 of nivolumab. She continues to have persistent weakness and fatigue. She continues to have occasional dizziness, but no other neurologic complaints. She does not complain of peripheral neuropathy today. She denies any recent fevers or illnesses. She denies any chest pain, cough, hemoptysis, or shortness of breath. She denies nausea, constipation, diarrhea, or vomiting.  She has no urinary complaints. Patient offers no further specific complaints today.  REVIEW OF SYSTEMS:   Review of Systems  Constitutional: Positive for malaise/fatigue. Negative for fever and weight loss.  Respiratory: Negative.  Negative for cough, hemoptysis and shortness of breath.   Cardiovascular: Negative.  Negative for chest pain and leg swelling.  Gastrointestinal: Negative.  Negative for abdominal pain.  Genitourinary: Negative for flank pain.  Musculoskeletal: Negative.  Negative for joint pain and myalgias.  Skin: Negative.  Negative for rash.  Neurological: Positive for dizziness and weakness. Negative for sensory change and headaches.  Psychiatric/Behavioral: Positive for memory loss. The patient is not nervous/anxious.     As per HPI. Otherwise, a complete review of systems is negative.  PAST MEDICAL HISTORY: Past Medical History:  Diagnosis Date  . Arthritis   . Blood  dyscrasia   . Cancer of left lung ([facility]) 08/2015   Rad + chemo tx's.  . Diabetes mellitus without complication ([place])   . Diabetic neuropathy ([place])   . High cholesterol   . Hypertension     PAST SURGICAL HISTORY: Past Surgical History:  Procedure Laterality Date  . ABDOMINAL HYSTERECTOMY    . CESAREAN SECTION    . ELECTROMAGNETIC NAVIGATION BROCHOSCOPY N/A 11/26/2015   Procedure: ELECTROMAGNETIC NAVIGATION BRONCHOSCOPY;  Surgeon: Flora Lipps, MD;  Location: [facility];  Service: Cardiopulmonary;  Laterality: N/A;  . ENDOBRONCHIAL ULTRASOUND N/A 11/26/2015   Procedure: ENDOBRONCHIAL ULTRASOUND;  Surgeon: Flora Lipps, MD;  Location: [facility];  Service: Cardiopulmonary;  Laterality: N/A;  . PERIPHERAL VASCULAR CATHETERIZATION N/A 12/09/2015   Procedure: Glori Luis Cath Insertion;  Surgeon: Katha Cabal, MD;  Location: [facility];  Service: Cardiovascular;  Laterality: N/A;    FAMILY HISTORY: Reviewed and unchanged. No reported history of malignancy or chronic disease.     ADVANCED DIRECTIVES:    HEALTH MAINTENANCE: Social History   Tobacco Use  . Smoking status: Current Every Day Smoker    Packs/day: 0.30    Years: 35.00    Pack years: 10.50    Types: Cigarettes  . Smokeless tobacco: Never Used  . Tobacco comment: workiing on quitting, down to 2 packs per week now  Substance Use Topics  . Alcohol use: No    Alcohol/week: 0.0 oz  . Drug use: No     No Known Allergies  Current Outpatient Medications  Medication Sig Dispense Refill  . albuterol (PROVENTIL HFA;VENTOLIN HFA) 108 (90 Base) MCG/ACT inhaler Inhale 2 puffs into the lungs every 6 (six) hours as needed for wheezing or shortness of breath. 1 Inhaler 2  . atorvastatin (LIPITOR)  20 MG tablet Take 20 mg by mouth daily at 6 PM.    . gabapentin (NEURONTIN) 300 MG capsule Take 1 capsule (300 mg total) at bedtime by mouth. 30 capsule 2  . glimepiride (AMARYL) 4 MG tablet Take by mouth.    . insulin glargine  (LANTUS) 100 UNIT/ML injection Inject 60 Units into the skin at bedtime.    Marland Kitchen levothyroxine (SYNTHROID, LEVOTHROID) 150 MCG tablet     . lidocaine-prilocaine (EMLA) cream Apply to affected area once 30 g 6  . linagliptin (TRADJENTA) 5 MG TABS tablet Take 5 mg by mouth daily.     Marland Kitchen lisinopril (PRINIVIL,ZESTRIL) 10 MG tablet Take 10 mg by mouth daily.    Marland Kitchen oxyCODONE (OXY IR/ROXICODONE) 5 MG immediate release tablet Take 1 tablet (5 mg total) by mouth every 6 (six) hours as needed for severe pain. 30 tablet 0   No current facility-administered medications for this visit.    Facility-Administered Medications Ordered in Other Visits  Medication Dose Route Frequency Provider Last Rate Last Dose  . heparin lock flush 100 unit/mL  500 Units Intracatheter Once PRN [name], MD      . sodium chloride flush (NS) 0.9 % injection 10 mL  10 mL Intracatheter PRN [name], MD        OBJECTIVE: Vitals:   11/06/17 1115  BP: 130/76  Pulse: 80  Temp: (!) 97 F (36.1 C)     Body mass index is 41.48 kg/m.    ECOG FS:1 - Symptomatic but completely ambulatory  General: Well-developed, well-nourished, no acute distress. Eyes: Pink conjunctiva, anicteric sclera. Lungs: Clear to auscultation bilaterally. Heart: Regular rate and rhythm. No rubs, murmurs, or gallops. Abdomen: Soft, nontender, nondistended. No organomegaly noted, normoactive bowel sounds. Musculoskeletal: No edema, cyanosis, or clubbing. Neuro: Alert, answering all questions appropriately. Cranial nerves grossly intact. Skin: No rashes or petechiae noted. Psych: Normal affect.   LAB RESULTS:  Lab Results  Component Value Date   NA 138 11/06/2017   K 4.5 11/06/2017   CL 104 11/06/2017   CO2 26 11/06/2017   GLUCOSE 200 (H) 11/06/2017   BUN 14 11/06/2017   CREATININE 0.81 11/06/2017   CALCIUM 9.1 11/06/2017   PROT 7.0 11/06/2017   ALBUMIN 3.6 11/06/2017   AST 22 11/06/2017   ALT 20 11/06/2017   ALKPHOS 78  11/06/2017   BILITOT 0.7 11/06/2017   GFRNONAA >60 11/06/2017   GFRAA >60 11/06/2017    Lab Results  Component Value Date   WBC 4.0 11/06/2017   NEUTROABS 2.5 11/06/2017   HGB 10.9 (L) 11/06/2017   HCT 32.7 (L) 11/06/2017   MCV 89.8 11/06/2017   PLT 141 (L) 11/06/2017   Lab Results  Component Value Date   IRON 42 07/31/2017   TIBC 280 07/31/2017   IRONPCTSAT 15 07/31/2017    Lab Results  Component Value Date   FERRITIN 81 07/31/2017     STUDIES: No results found.  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 She then underwent 2 cycles of consolidation carboplatinum and Taxol completing on Apr 27, 2016.  CT scan results from Apr 18, 2017 reviewed independently with progression of disease. Patient has now completed additional XRT. PET scan results from August 25, 2017 reviewed independently with  improvement of disease burden. Proceed with cycle 13 of nivolumab today.  Patient is going to [place] to see her grandson, therefore will return to clinic in 4 weeks for consideration of cycle 14.  Will also get a restaging PET scan 1-2 days prior to that appointment.   2. Headaches/dizziness: MRI of the brain reviewed independently with no obvious metastatic disease. 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 Okay to proceed from an oncology standpoint. Patient states she has postponed her appointments with neurosurgery until she can visit her new grandson in [place]. 3. Anemia: Improving. Previously iron stores were within normal limits, monitor.  4. Thrombocytopenia: Mild, monitor.  5. Hyperglycemia: Continue diabetic medications as prescribed.  Appreciate endocrinology input.   6. Pain: Patient does not complain of this today. Continue oxycodone  as prescribed, patient was given a prescription today.  Patient has been instructed to reinitiate her gabapentin and further discuss with her pharmacy since she was confused by receiving a different color capsule. 7. Thyroid: Patient's symptoms have significantly improved since initiating 100 g Synthroid daily.  Appreciate endocrinology input who will now be monitoring and adjusting her medications as needed.    Patient expressed understanding and was in agreement with this plan. She also understands that She can call clinic at any time with any questions, concerns, or complaints.    [name], MD   11/06/2017 1:25 PM

## 2017-11-06 ENCOUNTER — Inpatient Hospital Stay: Payer: PPO | Attending: Oncology | Admitting: Oncology

## 2017-11-06 ENCOUNTER — Inpatient Hospital Stay: Payer: PPO

## 2017-11-06 ENCOUNTER — Other Ambulatory Visit: Payer: Self-pay

## 2017-11-06 VITALS — BP 130/76 | HR 80 | Temp 97.0°F | Wt 257.0 lb

## 2017-11-06 DIAGNOSIS — E7800 Pure hypercholesterolemia, unspecified: Secondary | ICD-10-CM | POA: Insufficient documentation

## 2017-11-06 DIAGNOSIS — D649 Anemia, unspecified: Secondary | ICD-10-CM | POA: Insufficient documentation

## 2017-11-06 DIAGNOSIS — E1165 Type 2 diabetes mellitus with hyperglycemia: Secondary | ICD-10-CM | POA: Diagnosis not present

## 2017-11-06 DIAGNOSIS — E1140 Type 2 diabetes mellitus with diabetic neuropathy, unspecified: Secondary | ICD-10-CM | POA: Insufficient documentation

## 2017-11-06 DIAGNOSIS — R531 Weakness: Secondary | ICD-10-CM | POA: Insufficient documentation

## 2017-11-06 DIAGNOSIS — Z794 Long term (current) use of insulin: Secondary | ICD-10-CM | POA: Insufficient documentation

## 2017-11-06 DIAGNOSIS — R51 Headache: Secondary | ICD-10-CM | POA: Diagnosis not present

## 2017-11-06 DIAGNOSIS — C3412 Malignant neoplasm of upper lobe, left bronchus or lung: Secondary | ICD-10-CM | POA: Insufficient documentation

## 2017-11-06 DIAGNOSIS — R42 Dizziness and giddiness: Secondary | ICD-10-CM | POA: Insufficient documentation

## 2017-11-06 DIAGNOSIS — Z5111 Encounter for antineoplastic chemotherapy: Secondary | ICD-10-CM | POA: Insufficient documentation

## 2017-11-06 DIAGNOSIS — Z79899 Other long term (current) drug therapy: Secondary | ICD-10-CM | POA: Insufficient documentation

## 2017-11-06 DIAGNOSIS — F17210 Nicotine dependence, cigarettes, uncomplicated: Secondary | ICD-10-CM | POA: Insufficient documentation

## 2017-11-06 DIAGNOSIS — I10 Essential (primary) hypertension: Secondary | ICD-10-CM | POA: Insufficient documentation

## 2017-11-06 DIAGNOSIS — D696 Thrombocytopenia, unspecified: Secondary | ICD-10-CM | POA: Diagnosis not present

## 2017-11-06 DIAGNOSIS — Z923 Personal history of irradiation: Secondary | ICD-10-CM | POA: Insufficient documentation

## 2017-11-06 LAB — COMPREHENSIVE METABOLIC PANEL
ALK PHOS: 78 U/L (ref 38–126)
ALT: 20 U/L (ref 14–54)
ANION GAP: 8 (ref 5–15)
AST: 22 U/L (ref 15–41)
Albumin: 3.6 g/dL (ref 3.5–5.0)
BUN: 14 mg/dL (ref 6–20)
CALCIUM: 9.1 mg/dL (ref 8.9–10.3)
CO2: 26 mmol/L (ref 22–32)
CREATININE: 0.81 mg/dL (ref 0.44–1.00)
Chloride: 104 mmol/L (ref 101–111)
Glucose, Bld: 200 mg/dL — ABNORMAL HIGH (ref 65–99)
Potassium: 4.5 mmol/L (ref 3.5–5.1)
Sodium: 138 mmol/L (ref 135–145)
TOTAL PROTEIN: 7 g/dL (ref 6.5–8.1)
Total Bilirubin: 0.7 mg/dL (ref 0.3–1.2)

## 2017-11-06 LAB — CBC WITH DIFFERENTIAL/PLATELET
BASOS ABS: 0 10*3/uL (ref 0–0.1)
BASOS PCT: 0 %
EOS ABS: 0.2 10*3/uL (ref 0–0.7)
Eosinophils Relative: 5 %
HCT: 32.7 % — ABNORMAL LOW (ref 35.0–47.0)
HEMOGLOBIN: 10.9 g/dL — AB (ref 12.0–16.0)
Lymphocytes Relative: 25 %
Lymphs Abs: 1 10*3/uL (ref 1.0–3.6)
MCH: 30 pg (ref 26.0–34.0)
MCHC: 33.4 g/dL (ref 32.0–36.0)
MCV: 89.8 fL (ref 80.0–100.0)
Monocytes Absolute: 0.2 10*3/uL (ref 0.2–0.9)
Monocytes Relative: 6 %
NEUTROS PCT: 64 %
Neutro Abs: 2.5 10*3/uL (ref 1.4–6.5)
Platelets: 141 10*3/uL — ABNORMAL LOW (ref 150–440)
RBC: 3.64 MIL/uL — AB (ref 3.80–5.20)
RDW: 15.7 % — ABNORMAL HIGH (ref 11.5–14.5)
WBC: 4 10*3/uL (ref 3.6–11.0)

## 2017-11-06 MED ORDER — HEPARIN SOD (PORK) LOCK FLUSH 100 UNIT/ML IV SOLN
500.0000 [IU] | Freq: Once | INTRAVENOUS | Status: AC | PRN
  Administered 2017-11-06: 500 [IU]
  Filled 2017-11-06: qty 5

## 2017-11-06 MED ORDER — SODIUM CHLORIDE 0.9% FLUSH
10.0000 mL | INTRAVENOUS | Status: DC | PRN
  Administered 2017-11-06: 10 mL
  Filled 2017-11-06: qty 10

## 2017-11-06 MED ORDER — OXYCODONE HCL 5 MG PO TABS: 5.0000 mg | ORAL_TABLET | Freq: Four times a day (QID) | ORAL | 0 refills | Status: DC | PRN

## 2017-11-06 MED ORDER — SODIUM CHLORIDE 0.9 % IV SOLN
240.0000 mg | Freq: Once | INTRAVENOUS | Status: AC
  Administered 2017-11-06: 240 mg via INTRAVENOUS
  Filled 2017-11-06: qty 24

## 2017-11-06 MED ORDER — SODIUM CHLORIDE 0.9 % IV SOLN
Freq: Once | INTRAVENOUS | Status: AC
Start: 2017-11-06 — End: 2017-11-06
  Administered 2017-11-06: 13:00:00 via INTRAVENOUS
  Filled 2017-11-06: qty 1000

## 2017-11-07 LAB — THYROID PANEL WITH TSH
Free Thyroxine Index: 1.5 (ref 1.2–4.9)
T3 Uptake Ratio: 28 % (ref 24–39)
T4, Total: 5.5 ug/dL (ref 4.5–12.0)
TSH: 35.92 u[IU]/mL — ABNORMAL HIGH (ref 0.450–4.500)

## 2017-11-21 ENCOUNTER — Other Ambulatory Visit: Payer: Self-pay | Admitting: *Deleted

## 2017-11-21 MED ORDER — OXYCODONE HCL 5 MG PO TABS: 5.0000 mg | ORAL_TABLET | Freq: Four times a day (QID) | ORAL | 0 refills | Status: DC | PRN

## 2017-11-30 ENCOUNTER — Encounter
Admission: RE | Admit: 2017-11-30 | Discharge: 2017-11-30 | Disposition: A | Discharge status: Home / Self Care | Payer: PPO | Source: Ambulatory Visit | Attending: Oncology | Admitting: Oncology

## 2017-11-30 DIAGNOSIS — C3412 Malignant neoplasm of upper lobe, left bronchus or lung: Secondary | ICD-10-CM | POA: Insufficient documentation

## 2017-11-30 DIAGNOSIS — C3490 Malignant neoplasm of unspecified part of unspecified bronchus or lung: Secondary | ICD-10-CM | POA: Diagnosis not present

## 2017-11-30 HISTORY — DX: Reserved for inherently not codable concepts without codable children: IMO0001

## 2017-11-30 HISTORY — DX: Long term (current) use of insulin: Z79.4

## 2017-11-30 HISTORY — DX: Type 2 diabetes mellitus without complications: E11.9

## 2017-11-30 LAB — GLUCOSE, CAPILLARY: GLUCOSE-CAPILLARY: 117 mg/dL — AB (ref 65–99)

## 2017-11-30 MED ORDER — FLUDEOXYGLUCOSE F - 18 (FDG) INJECTION
12.0000 | Freq: Once | INTRAVENOUS | Status: AC | PRN
  Administered 2017-11-30: 13.2 via INTRAVENOUS

## 2017-12-02 ENCOUNTER — Other Ambulatory Visit: Payer: Self-pay | Admitting: Oncology

## 2017-12-02 NOTE — Progress Notes (Signed)
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  INTERVAL HISTORY: Patient returns to clinic today for further evaluation and discussion of her imaging results.  She continues to have persistent weakness and fatigue. She continues to have occasional dizziness. Her peripheral neuropathy also seems to be worse, but she has not restarted her gabapentin. She denies any recent fevers or illnesses. She denies any chest pain, cough, hemoptysis, or shortness of breath. She denies nausea, constipation, diarrhea, or vomiting.  She has no urinary complaints. Patient offers no further specific complaints today.  REVIEW OF SYSTEMS:   Review of Systems  Constitutional: Positive for malaise/fatigue. Negative for fever and weight loss.  Respiratory: Negative.  Negative for cough, hemoptysis and shortness of breath.   Cardiovascular: Negative.  Negative for chest pain and leg swelling.  Gastrointestinal: Negative.  Negative for abdominal pain.  Genitourinary: Negative for flank pain.  Musculoskeletal: Negative.  Negative for joint pain and myalgias.  Skin: Negative.  Negative for rash.  Neurological: Positive for dizziness, sensory change and weakness. Negative for headaches.  Psychiatric/Behavioral: Negative.  Negative for memory loss. The patient is not nervous/anxious.     As per HPI. Otherwise, a complete review of systems is negative.  PAST MEDICAL HISTORY: Past Medical History:  Diagnosis Date  . Arthritis   . Blood  dyscrasia   . Cancer of left lung ([place]) 08/2015   Rad + chemo tx's.  . Diabetes mellitus without complication ([place])   . Diabetic neuropathy ([place])   . High cholesterol   . Hypertension   . Insulin dependent diabetes mellitus ([place])     PAST SURGICAL HISTORY: Past Surgical History:  Procedure Laterality Date  . ABDOMINAL HYSTERECTOMY    . CESAREAN SECTION    . ELECTROMAGNETIC NAVIGATION BROCHOSCOPY N/A 11/26/2015   Procedure: ELECTROMAGNETIC NAVIGATION BRONCHOSCOPY;  Surgeon: Flora Lipps, MD;  Location: [facility];  Service: Cardiopulmonary;  Laterality: N/A;  . ENDOBRONCHIAL ULTRASOUND N/A 11/26/2015   Procedure: ENDOBRONCHIAL ULTRASOUND;  Surgeon: Flora Lipps, MD;  Location: [facility];  Service: Cardiopulmonary;  Laterality: N/A;  . PERIPHERAL VASCULAR CATHETERIZATION N/A 12/09/2015   Procedure: Glori Luis Cath Insertion;  Surgeon: Katha Cabal, MD;  Location: [facility];  Service: Cardiovascular;  Laterality: N/A;    FAMILY HISTORY: Reviewed and unchanged. No reported history of malignancy or chronic disease.     ADVANCED DIRECTIVES:    HEALTH MAINTENANCE: Social History   Tobacco Use  . Smoking status: Current Every Day Smoker    Packs/day: 0.30    Years: 35.00    Pack years: 10.50    Types: Cigarettes  . Smokeless tobacco: Never Used  . Tobacco comment: workiing on quitting, down to 2 packs per week now  Substance Use Topics  . Alcohol use: No    Alcohol/week: 0.0 oz  . Drug use: No     No Known Allergies  Current Outpatient Medications  Medication Sig Dispense Refill  . albuterol (PROVENTIL HFA;VENTOLIN HFA) 108 (90 Base) MCG/ACT inhaler Inhale 2 puffs into the lungs every 6 (six) hours as needed for wheezing  or shortness of breath. 1 Inhaler 2  . atorvastatin (LIPITOR) 20 MG tablet Take 20 mg by mouth daily at 6 PM.    . gabapentin (NEURONTIN) 300 MG capsule Take 1 capsule (300 mg total) at bedtime by mouth. 30 capsule 2  . glimepiride (AMARYL) 4 MG  tablet Take by mouth.    . insulin glargine (LANTUS) 100 UNIT/ML injection Inject 60 Units into the skin at bedtime.    Marland Kitchen levothyroxine (SYNTHROID, LEVOTHROID) 150 MCG tablet     . lidocaine-prilocaine (EMLA) cream Apply to affected area once 30 g 6  . linagliptin (TRADJENTA) 5 MG TABS tablet Take 5 mg by mouth daily.     Marland Kitchen lisinopril (PRINIVIL,ZESTRIL) 10 MG tablet Take 10 mg by mouth daily.    Marland Kitchen oxyCODONE (OXY IR/ROXICODONE) 5 MG immediate release tablet Take 1 tablet (5 mg total) by mouth every 6 (six) hours as needed for severe pain. 30 tablet 0   No current facility-administered medications for this visit.     OBJECTIVE: Vitals:   12/04/17 0950  BP: 137/82  Pulse: 80  Resp: 20  Temp: (!) 96.2 F (35.7 C)     Body mass index is 41.88 kg/m.    ECOG FS:1 - Symptomatic but completely ambulatory  General: Well-developed, well-nourished, no acute distress. Eyes: Pink conjunctiva, anicteric sclera. Lungs: Clear to auscultation bilaterally. Heart: Regular rate and rhythm. No rubs, murmurs, or gallops. Abdomen: Soft, nontender, nondistended. No organomegaly noted, normoactive bowel sounds. Musculoskeletal: No edema, cyanosis, or clubbing. Neuro: Alert, answering all questions appropriately. Cranial nerves grossly intact. Skin: No rashes or petechiae noted. Psych: Normal affect.   LAB RESULTS:  Lab Results  Component Value Date   NA 137 12/04/2017   K 4.2 12/04/2017   CL 104 12/04/2017   CO2 26 12/04/2017   GLUCOSE 225 (H) 12/04/2017   BUN 14 12/04/2017   CREATININE 0.84 12/04/2017   CALCIUM 8.8 (L) 12/04/2017   PROT 6.7 12/04/2017   ALBUMIN 3.4 (L) 12/04/2017   AST 21 12/04/2017   ALT 15 12/04/2017   ALKPHOS 70 12/04/2017   BILITOT 0.5 12/04/2017   GFRNONAA >60 12/04/2017   GFRAA >60 12/04/2017    Lab Results  Component Value Date   WBC 3.8 12/04/2017   NEUTROABS 2.4 12/04/2017   HGB 10.9 (L) 12/04/2017   HCT 32.6 (L) 12/04/2017   MCV 92.0 12/04/2017   PLT  152 12/04/2017   Lab Results  Component Value Date   IRON 42 07/31/2017   TIBC 280 07/31/2017   IRONPCTSAT 15 07/31/2017    Lab Results  Component Value Date   FERRITIN 81 07/31/2017     STUDIES: Nm Pet Image Restag (ps) Skull Base To Thigh  Result Date: 11/30/2017 CLINICAL DATA:  Subsequent treatment strategy for LEFT lung carcinoma. Patient status post chemotherapy therapy. EXAM: NUCLEAR MEDICINE PET SKULL BASE TO THIGH TECHNIQUE: 13.2 mCi F-18 FDG was injected intravenously. Full-ring PET imaging was performed from the skull base to thigh after the radiotracer. CT data was obtained and used for attenuation correction and anatomic localization. FASTING BLOOD GLUCOSE:  Value: 117 mg/dl COMPARISON:  PET-CT 9218 FINDINGS: NECK No hypermetabolic lymph nodes in the neck. CHEST Unfortunately there is a new enlarged hypermetabolic subcarinal lymph node measuring 2.7 cm with intense metabolic activity (SUV 12.4) There is new masslike thickening in the upper lobe LEFT upper lobe along the mediastinum superior to the radiation change seen on comparison exam. Majority of this thickening is similar metabolic activity to  the mediastinal activity with one focus of increased radiotracer activity SUV max equal 6.1. New small LEFT effusion. No supraclavicular adenopathy. ABDOMEN/PELVIS No abnormal hypermetabolic activity within the liver, pancreas, adrenal glands, or spleen. No hypermetabolic lymph nodes in the abdomen or pelvis. SKELETON No focal hypermetabolic activity to suggest skeletal metastasis. IMPRESSION: 1. Lung cancer recurrence with new enlarged hypermetabolic subcarinallymph node. 2. Increase consolidation in the medial aspect of the LEFT upper lobe with a focus of metabolic activity is concerning for local recurrence in the LEFT upper lobe. Electronically Signed   By: Suzy Bouchard M.D.   On: 11/30/2017 12:44    Oncology treatment history:  Patient completed her initial treatment with  concurrent chemotherapy and XRT on March 07, 2016. She then underwent 2 cycles of consolidation carboplatinum and Taxol completing on Apr 27, 2016.  CT scan results from Apr 18, 2017 revealed progression of disease. She subsequently received additional XRT and proceeded with nivolumab every 2 weeks.  She completed a total of 13 infusions of nivolumab on November 06, 2017.  PET scan on November 30, 2017 revealed progression of disease.  Patient then started carboplatinum and gemcitabine on December 11, 2017.  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 PET scan results from November 30, 2017 reviewed independently and reported as above with new enlarged subcarinal lymph node as well as progression of disease in her left upper lobe.  Will discontinue nivolumab and return to clinic in 1 week to receive carboplatinum and gemcitabine on day 1 with gemcitabine only on day 8.  This will be a 21-day cycle.  Will reimage in March 2019 at the conclusion of cycle 4.   2. Headaches/dizziness: MRI of the brain reviewed independently with no obvious metastatic disease. 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 Okay to proceed from an oncology standpoint. Patient states she has postponed her appointments with neurosurgery until she can visit her new grandson in [place]. 3. Anemia: Improving. Previously iron stores were within normal limits, monitor.  4. Thrombocytopenia: Resolved..  5. Hyperglycemia: Continue diabetic medications as prescribed.  Appreciate endocrinology input.   6. Pain: Patient does not complain of this today. Continue oxycodone as prescribed.  Patient has been instructed to reinitiate her gabapentin and further discuss with her pharmacy since she was confused by receiving a different color capsule. 7. Thyroid: Patient's  symptoms have significantly improved since initiating 100 g Synthroid daily.  Appreciate endocrinology input who will now be monitoring and adjusting her medications as needed.    Patient expressed understanding and was in agreement with this plan. She also understands that She can call clinic at any time with any questions, concerns, or complaints.    [name], MD   12/04/2017 1:08 PM

## 2017-12-04 ENCOUNTER — Other Ambulatory Visit: Payer: Self-pay

## 2017-12-04 ENCOUNTER — Inpatient Hospital Stay: Payer: PPO

## 2017-12-04 ENCOUNTER — Encounter: Payer: Self-pay | Admitting: Oncology

## 2017-12-04 ENCOUNTER — Inpatient Hospital Stay (HOSPITAL_BASED_OUTPATIENT_CLINIC_OR_DEPARTMENT_OTHER): Payer: PPO | Admitting: Oncology

## 2017-12-04 ENCOUNTER — Other Ambulatory Visit: Payer: Self-pay | Admitting: Oncology

## 2017-12-04 VITALS — BP 137/82 | HR 80 | Temp 96.2°F | Resp 20 | Wt 259.5 lb

## 2017-12-04 DIAGNOSIS — D649 Anemia, unspecified: Secondary | ICD-10-CM | POA: Diagnosis not present

## 2017-12-04 DIAGNOSIS — C3412 Malignant neoplasm of upper lobe, left bronchus or lung: Secondary | ICD-10-CM

## 2017-12-04 DIAGNOSIS — Z79899 Other long term (current) drug therapy: Secondary | ICD-10-CM | POA: Diagnosis not present

## 2017-12-04 DIAGNOSIS — E1165 Type 2 diabetes mellitus with hyperglycemia: Secondary | ICD-10-CM | POA: Diagnosis not present

## 2017-12-04 DIAGNOSIS — F17210 Nicotine dependence, cigarettes, uncomplicated: Secondary | ICD-10-CM | POA: Diagnosis not present

## 2017-12-04 DIAGNOSIS — Z5111 Encounter for antineoplastic chemotherapy: Secondary | ICD-10-CM | POA: Diagnosis not present

## 2017-12-04 DIAGNOSIS — R51 Headache: Secondary | ICD-10-CM

## 2017-12-04 DIAGNOSIS — R42 Dizziness and giddiness: Secondary | ICD-10-CM | POA: Diagnosis not present

## 2017-12-04 DIAGNOSIS — Z95828 Presence of other vascular implants and grafts: Secondary | ICD-10-CM

## 2017-12-04 DIAGNOSIS — Z923 Personal history of irradiation: Secondary | ICD-10-CM | POA: Diagnosis not present

## 2017-12-04 DIAGNOSIS — R531 Weakness: Secondary | ICD-10-CM | POA: Diagnosis not present

## 2017-12-04 LAB — COMPREHENSIVE METABOLIC PANEL
ALBUMIN: 3.4 g/dL — AB (ref 3.5–5.0)
ALK PHOS: 70 U/L (ref 38–126)
ALT: 15 U/L (ref 14–54)
AST: 21 U/L (ref 15–41)
Anion gap: 7 (ref 5–15)
BILIRUBIN TOTAL: 0.5 mg/dL (ref 0.3–1.2)
BUN: 14 mg/dL (ref 6–20)
CALCIUM: 8.8 mg/dL — AB (ref 8.9–10.3)
CO2: 26 mmol/L (ref 22–32)
Chloride: 104 mmol/L (ref 101–111)
Creatinine, Ser: 0.84 mg/dL (ref 0.44–1.00)
GFR calc Af Amer: >60 mL/min (ref >60)
GFR calc non Af Amer: >60 mL/min (ref >60)
GLUCOSE: 225 mg/dL — AB (ref 65–99)
Potassium: 4.2 mmol/L (ref 3.5–5.1)
Sodium: 137 mmol/L (ref 135–145)
TOTAL PROTEIN: 6.7 g/dL (ref 6.5–8.1)

## 2017-12-04 LAB — CBC WITH DIFFERENTIAL/PLATELET
BASOS PCT: 1 %
Basophils Absolute: 0 10*3/uL (ref 0–0.1)
Eosinophils Absolute: 0.2 10*3/uL (ref 0–0.7)
Eosinophils Relative: 5 %
HEMATOCRIT: 32.6 % — AB (ref 35.0–47.0)
HEMOGLOBIN: 10.9 g/dL — AB (ref 12.0–16.0)
LYMPHS ABS: 0.9 10*3/uL — AB (ref 1.0–3.6)
LYMPHS PCT: 24 %
MCH: 30.6 pg (ref 26.0–34.0)
MCHC: 33.3 g/dL (ref 32.0–36.0)
MCV: 92 fL (ref 80.0–100.0)
MONO ABS: 0.3 10*3/uL (ref 0.2–0.9)
MONOS PCT: 7 %
NEUTROS ABS: 2.4 10*3/uL (ref 1.4–6.5)
NEUTROS PCT: 63 %
Platelets: 152 10*3/uL (ref 150–440)
RBC: 3.54 MIL/uL — ABNORMAL LOW (ref 3.80–5.20)
RDW: 14.8 % — AB (ref 11.5–14.5)
WBC: 3.8 10*3/uL (ref 3.6–11.0)

## 2017-12-04 MED ORDER — ONDANSETRON HCL 8 MG PO TABS: 8.0000 mg | ORAL_TABLET | Freq: Two times a day (BID) | ORAL | 2 refills | Status: DC | PRN

## 2017-12-04 MED ORDER — LIDOCAINE-PRILOCAINE 2.5-2.5 % EX CREA: TOPICAL_CREAM | CUTANEOUS | 3 refills | Status: DC

## 2017-12-04 MED ORDER — HEPARIN SOD (PORK) LOCK FLUSH 100 UNIT/ML IV SOLN
500.0000 [IU] | Freq: Once | INTRAVENOUS | Status: AC
  Administered 2017-12-04: 500 [IU] via INTRAVENOUS

## 2017-12-04 MED ORDER — PROCHLORPERAZINE MALEATE 10 MG PO TABS: 10.0000 mg | ORAL_TABLET | Freq: Four times a day (QID) | ORAL | 2 refills | Status: DC | PRN

## 2017-12-04 MED ORDER — HEPARIN SOD (PORK) LOCK FLUSH 100 UNIT/ML IV SOLN
INTRAVENOUS | Status: AC
  Filled 2017-12-04: qty 5

## 2017-12-04 NOTE — Progress Notes (Signed)
Patient reports worsening neuropathy in legs and feet.

## 2017-12-05 LAB — THYROID PANEL WITH TSH
FREE THYROXINE INDEX: 0.7 — AB (ref 1.2–4.9)
T3 Uptake Ratio: 22 % — ABNORMAL LOW (ref 24–39)
T4 TOTAL: 3 ug/dL — AB (ref 4.5–12.0)
TSH: 75.25 u[IU]/mL — AB (ref 0.450–4.500)

## 2017-12-07 DIAGNOSIS — E1165 Type 2 diabetes mellitus with hyperglycemia: Secondary | ICD-10-CM | POA: Diagnosis not present

## 2017-12-07 DIAGNOSIS — E039 Hypothyroidism, unspecified: Secondary | ICD-10-CM | POA: Diagnosis not present

## 2017-12-07 DIAGNOSIS — Z794 Long term (current) use of insulin: Secondary | ICD-10-CM | POA: Diagnosis not present

## 2017-12-10 NOTE — Progress Notes (Signed)
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  INTERVAL HISTORY: Patient returns to clinic today for further evaluation and initiation of cycle 1, day 1 of palliative carboplatinum and gemcitabine.  Her weakness and fatigue have improved since last week. She continues to have occasional dizziness. Her peripheral neuropathy is unchanged. She denies any recent fevers or illnesses. She denies any chest pain, cough, hemoptysis, or shortness of breath. She denies nausea, constipation, diarrhea, or vomiting.  She has no urinary complaints. Patient offers no further specific complaints today.  REVIEW OF SYSTEMS:   Review of Systems  Constitutional: Positive for malaise/fatigue. Negative for fever and weight loss.  Respiratory: Negative.  Negative for cough, hemoptysis and shortness of breath.   Cardiovascular: Negative.  Negative for chest pain and leg swelling.  Gastrointestinal: Negative.  Negative for abdominal pain.  Genitourinary: Negative for flank pain.  Musculoskeletal: Negative.  Negative for joint pain and myalgias.  Skin: Negative.  Negative for rash.  Neurological: Positive for dizziness, sensory change and weakness. Negative for headaches.  Psychiatric/Behavioral: Negative.  Negative for memory loss. The patient is not nervous/anxious.     As per HPI. Otherwise, a complete review of systems is negative.  PAST MEDICAL HISTORY: Past Medical History:  Diagnosis Date  . Arthritis   . Blood dyscrasia     . Cancer of left lung ([place]) 08/2015   Rad + chemo tx's.  . Diabetes mellitus without complication ([place])   . Diabetic neuropathy ([place])   . High cholesterol   . Hypertension   . Insulin dependent diabetes mellitus ([place])     PAST SURGICAL HISTORY: Past Surgical History:  Procedure Laterality Date  . ABDOMINAL HYSTERECTOMY    . CESAREAN SECTION    . ELECTROMAGNETIC NAVIGATION BROCHOSCOPY N/A 11/26/2015   Procedure: ELECTROMAGNETIC NAVIGATION BRONCHOSCOPY;  Surgeon: Flora Lipps, MD;  Location: [facility];  Service: Cardiopulmonary;  Laterality: N/A;  . ENDOBRONCHIAL ULTRASOUND N/A 11/26/2015   Procedure: ENDOBRONCHIAL ULTRASOUND;  Surgeon: Flora Lipps, MD;  Location: [facility];  Service: Cardiopulmonary;  Laterality: N/A;  . PERIPHERAL VASCULAR CATHETERIZATION N/A 12/09/2015   Procedure: Glori Luis Cath Insertion;  Surgeon: Katha Cabal, MD;  Location: [place] [facility];  Service: Cardiovascular;  Laterality: N/A;    FAMILY HISTORY: Reviewed and unchanged. No reported history of malignancy or chronic disease.     ADVANCED DIRECTIVES:    HEALTH MAINTENANCE: Social History   Tobacco Use  . Smoking status: Current Every Day Smoker    Packs/day: 0.30    Years: 35.00    Pack years: 10.50    Types: Cigarettes  . Smokeless tobacco: Never Used  . Tobacco comment: workiing on quitting, down to 2 packs per week now  Substance Use Topics  . Alcohol use: No    Alcohol/week: 0.0 oz  . Drug use: No     No Known Allergies  Current Outpatient Medications  Medication Sig Dispense Refill  . albuterol (PROVENTIL HFA;VENTOLIN HFA) 108 (90 Base) MCG/ACT inhaler Inhale 2 puffs into the lungs every 6 (six) hours as needed for wheezing or shortness  of breath. 1 Inhaler 2  . atorvastatin (LIPITOR) 20 MG tablet Take 20 mg by mouth daily at 6 PM.    . gabapentin (NEURONTIN) 300 MG capsule Take 1 capsule (300 mg total) by mouth at bedtime. 30 capsule 2  . glimepiride (AMARYL) 4 MG tablet Take by  mouth.    . insulin glargine (LANTUS) 100 UNIT/ML injection Inject 60 Units into the skin at bedtime.    Marland Kitchen levothyroxine (SYNTHROID, LEVOTHROID) 150 MCG tablet     . lidocaine-prilocaine (EMLA) cream Apply to affected area once 30 g 6  . lidocaine-prilocaine (EMLA) cream Apply to affected area once 30 g 3  . linagliptin (TRADJENTA) 5 MG TABS tablet Take 5 mg by mouth daily.     Marland Kitchen lisinopril (PRINIVIL,ZESTRIL) 10 MG tablet Take 10 mg by mouth daily.    . ondansetron (ZOFRAN) 8 MG tablet Take 1 tablet (8 mg total) by mouth 2 (two) times daily as needed for refractory nausea / vomiting. 30 tablet 2  . oxyCODONE (OXY IR/ROXICODONE) 5 MG immediate release tablet Take 1 tablet (5 mg total) by mouth every 6 (six) hours as needed for severe pain. 30 tablet 0  . prochlorperazine (COMPAZINE) 10 MG tablet Take 1 tablet (10 mg total) by mouth every 6 (six) hours as needed (Nausea or vomiting). 60 tablet 2   No current facility-administered medications for this visit.    Facility-Administered Medications Ordered in Other Visits  Medication Dose Route Frequency Provider Last Rate Last Dose  . CARBOplatin (PARAPLATIN) 630 mg in sodium chloride 0.9 % 250 mL chemo infusion  630 mg Intravenous Once [name], MD   Stopped at 12/11/17 1200  . [COMPLETED] heparin lock flush 100 unit/mL  500 Units Intravenous Once [name], MD   500 Units at 12/11/17 1205  . sodium chloride flush (NS) 0.9 % injection 10 mL  10 mL Intravenous PRN [name], MD   10 mL at 12/11/17 0827    OBJECTIVE: Vitals:   12/11/17 0853  BP: 119/73  Pulse: 74  Resp: 18  Temp: (!) 96.5 F (35.8 C)     Body mass index is 42.66 kg/m.    ECOG FS:1 - Symptomatic but completely ambulatory  General: Well-developed, well-nourished, no acute distress. Eyes: Pink conjunctiva, anicteric sclera. Lungs: Clear to auscultation bilaterally. Heart: Regular rate and rhythm. No rubs, murmurs, or gallops. Abdomen: Soft,  nontender, nondistended. No organomegaly noted, normoactive bowel sounds. Musculoskeletal: No edema, cyanosis, or clubbing. Neuro: Alert, answering all questions appropriately. Cranial nerves grossly intact. Skin: No rashes or petechiae noted. Psych: Normal affect.   LAB RESULTS:  Lab Results  Component Value Date   NA 137 12/11/2017   K 4.0 12/11/2017   CL 105 12/11/2017   CO2 25 12/11/2017   GLUCOSE 125 (H) 12/11/2017   BUN 19 12/11/2017   CREATININE 0.88 12/11/2017   CALCIUM 8.9 12/11/2017   PROT 7.1 12/11/2017   ALBUMIN 3.6 12/11/2017   AST 26 12/11/2017   ALT 17 12/11/2017   ALKPHOS 70 12/11/2017   BILITOT 0.5 12/11/2017   GFRNONAA >60 12/11/2017   GFRAA >60 12/11/2017    Lab Results  Component Value Date   WBC 3.9 12/11/2017   NEUTROABS 2.6 12/11/2017   HGB 10.8 (L) 12/11/2017   HCT 32.4 (L) 12/11/2017   MCV 91.7 12/11/2017   PLT 146 (L) 12/11/2017   Lab Results  Component Value Date   IRON 42 07/31/2017   TIBC 280 07/31/2017  IRONPCTSAT 15 07/31/2017    Lab Results  Component Value Date   FERRITIN 81 07/31/2017     STUDIES: Nm Pet Image Restag (ps) Skull Base To Thigh  Result Date: 11/30/2017 CLINICAL DATA:  Subsequent treatment strategy for LEFT lung carcinoma. Patient status post chemotherapy therapy. EXAM: NUCLEAR MEDICINE PET SKULL BASE TO THIGH TECHNIQUE: 13.2 mCi F-18 FDG was injected intravenously. Full-ring PET imaging was performed from the skull base to thigh after the radiotracer. CT data was obtained and used for attenuation correction and anatomic localization. FASTING BLOOD GLUCOSE:  Value: 117 mg/dl COMPARISON:  PET-CT 9218 FINDINGS: NECK No hypermetabolic lymph nodes in the neck. CHEST Unfortunately there is a new enlarged hypermetabolic subcarinal lymph node measuring 2.7 cm with intense metabolic activity (SUV 96.0) There is new masslike thickening in the upper lobe LEFT upper lobe along the mediastinum superior to the radiation change  seen on comparison exam. Majority of this thickening is similar metabolic activity to the mediastinal activity with one focus of increased radiotracer activity SUV max equal 6.1. New small LEFT effusion. No supraclavicular adenopathy. ABDOMEN/PELVIS No abnormal hypermetabolic activity within the liver, pancreas, adrenal glands, or spleen. No hypermetabolic lymph nodes in the abdomen or pelvis. SKELETON No focal hypermetabolic activity to suggest skeletal metastasis. IMPRESSION: 1. Lung cancer recurrence with new enlarged hypermetabolic subcarinallymph node. 2. Increase consolidation in the medial aspect of the LEFT upper lobe with a focus of metabolic activity is concerning for local recurrence in the LEFT upper lobe. Electronically Signed   By: Suzy Bouchard M.D.   On: 11/30/2017 12:44    Oncology treatment history:  Patient completed her initial treatment with concurrent chemotherapy and XRT on March 07, 2016. She then underwent 2 cycles of consolidation carboplatinum and Taxol completing on Apr 27, 2016.  CT scan results from Apr 18, 2017 revealed progression of disease. She subsequently received additional XRT and proceeded with nivolumab every 2 weeks.  She completed a total of 13 infusions of nivolumab on November 06, 2017.  PET scan on November 30, 2017 revealed progression of disease.  Patient started carboplatinum and gemcitabine on December 11, 2017.  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 PET scan results from November 30, 2017 reviewed independently with new enlarged subcarinal lymph node as well as progression of disease in her left upper lobe.  Nivolumab has been discontinued.  Proceed with cycle 1, day 1 of carboplatinum and gemcitabine today.  Return to clinic in 1 week for consideration of cycle 1, day 8 which will be gemcitabine only.   This will be a 21-day cycle.  Will reimage in March 2019 at the conclusion of cycle 4.   2. Headaches/dizziness: MRI of the brain reviewed independently with no obvious metastatic disease. 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 Okay to proceed from an oncology standpoint. Patient states she has postponed her appointments with neurosurgery. 3. Anemia: Improving. Previously iron stores were within normal limits, monitor.  4. Thrombocytopenia: Mild, monitor. 5. Hyperglycemia: Continue diabetic medications as prescribed.  Appreciate endocrinology input.   6. Pain: Patient does not complain of this today. Continue oxycodone as prescribed.  Patient has been instructed to reinitiate her gabapentin and further discuss with her pharmacy since she was confused by receiving a different color capsule. 7. Thyroid: Patient's symptoms have significantly improved since  increasing her Synthroid dose. Appreciate endocrinology input who will now be monitoring and adjusting her medications as needed.    Patient expressed understanding and was in agreement with this plan. She also understands that She can call clinic at any time with any questions, concerns, or complaints.    [name], MD   12/11/2017 11:35 AM

## 2017-12-11 ENCOUNTER — Inpatient Hospital Stay (HOSPITAL_BASED_OUTPATIENT_CLINIC_OR_DEPARTMENT_OTHER): Payer: PPO | Admitting: Oncology

## 2017-12-11 ENCOUNTER — Inpatient Hospital Stay: Payer: PPO

## 2017-12-11 ENCOUNTER — Inpatient Hospital Stay: Payer: PPO | Attending: Oncology

## 2017-12-11 VITALS — BP 119/73 | HR 74 | Temp 96.5°F | Resp 18 | Wt 264.3 lb

## 2017-12-11 DIAGNOSIS — R42 Dizziness and giddiness: Secondary | ICD-10-CM

## 2017-12-11 DIAGNOSIS — G629 Polyneuropathy, unspecified: Secondary | ICD-10-CM | POA: Diagnosis not present

## 2017-12-11 DIAGNOSIS — E1165 Type 2 diabetes mellitus with hyperglycemia: Secondary | ICD-10-CM | POA: Insufficient documentation

## 2017-12-11 DIAGNOSIS — Z5111 Encounter for antineoplastic chemotherapy: Secondary | ICD-10-CM | POA: Diagnosis not present

## 2017-12-11 DIAGNOSIS — D649 Anemia, unspecified: Secondary | ICD-10-CM

## 2017-12-11 DIAGNOSIS — G620 Drug-induced polyneuropathy: Secondary | ICD-10-CM

## 2017-12-11 DIAGNOSIS — Z452 Encounter for adjustment and management of vascular access device: Secondary | ICD-10-CM | POA: Diagnosis not present

## 2017-12-11 DIAGNOSIS — R51 Headache: Secondary | ICD-10-CM | POA: Diagnosis not present

## 2017-12-11 DIAGNOSIS — R531 Weakness: Secondary | ICD-10-CM | POA: Diagnosis not present

## 2017-12-11 DIAGNOSIS — C3412 Malignant neoplasm of upper lobe, left bronchus or lung: Secondary | ICD-10-CM | POA: Diagnosis not present

## 2017-12-11 DIAGNOSIS — D696 Thrombocytopenia, unspecified: Secondary | ICD-10-CM | POA: Diagnosis not present

## 2017-12-11 DIAGNOSIS — D701 Agranulocytosis secondary to cancer chemotherapy: Secondary | ICD-10-CM | POA: Insufficient documentation

## 2017-12-11 DIAGNOSIS — R5383 Other fatigue: Secondary | ICD-10-CM | POA: Insufficient documentation

## 2017-12-11 DIAGNOSIS — T451X5A Adverse effect of antineoplastic and immunosuppressive drugs, initial encounter: Secondary | ICD-10-CM

## 2017-12-11 LAB — CBC WITH DIFFERENTIAL/PLATELET
Basophils Absolute: 0 10*3/uL (ref 0–0.1)
Basophils Relative: 0 %
Eosinophils Absolute: 0.2 10*3/uL (ref 0–0.7)
Eosinophils Relative: 4 %
HEMATOCRIT: 32.4 % — AB (ref 35.0–47.0)
HEMOGLOBIN: 10.8 g/dL — AB (ref 12.0–16.0)
LYMPHS ABS: 0.9 10*3/uL — AB (ref 1.0–3.6)
Lymphocytes Relative: 23 %
MCH: 30.6 pg (ref 26.0–34.0)
MCHC: 33.4 g/dL (ref 32.0–36.0)
MCV: 91.7 fL (ref 80.0–100.0)
MONOS PCT: 7 %
Monocytes Absolute: 0.3 10*3/uL (ref 0.2–0.9)
NEUTROS PCT: 66 %
Neutro Abs: 2.6 10*3/uL (ref 1.4–6.5)
Platelets: 146 10*3/uL — ABNORMAL LOW (ref 150–440)
RBC: 3.54 MIL/uL — ABNORMAL LOW (ref 3.80–5.20)
RDW: 14.2 % (ref 11.5–14.5)
WBC: 3.9 10*3/uL (ref 3.6–11.0)

## 2017-12-11 LAB — COMPREHENSIVE METABOLIC PANEL
ALBUMIN: 3.6 g/dL (ref 3.5–5.0)
ALT: 17 U/L (ref 14–54)
AST: 26 U/L (ref 15–41)
Alkaline Phosphatase: 70 U/L (ref 38–126)
Anion gap: 7 (ref 5–15)
BUN: 19 mg/dL (ref 6–20)
CHLORIDE: 105 mmol/L (ref 101–111)
CO2: 25 mmol/L (ref 22–32)
CREATININE: 0.88 mg/dL (ref 0.44–1.00)
Calcium: 8.9 mg/dL (ref 8.9–10.3)
GFR calc non Af Amer: >60 mL/min (ref >60)
GLUCOSE: 125 mg/dL — AB (ref 65–99)
Potassium: 4 mmol/L (ref 3.5–5.1)
SODIUM: 137 mmol/L (ref 135–145)
Total Bilirubin: 0.5 mg/dL (ref 0.3–1.2)
Total Protein: 7.1 g/dL (ref 6.5–8.1)

## 2017-12-11 MED ORDER — SODIUM CHLORIDE 0.9 % IV SOLN: 10.0000 mg | Freq: Once | INTRAVENOUS | Status: DC

## 2017-12-11 MED ORDER — SODIUM CHLORIDE 0.9 % IV SOLN
630.0000 mg | Freq: Once | INTRAVENOUS | Status: AC
  Administered 2017-12-11: 630 mg via INTRAVENOUS
  Filled 2017-12-11: qty 63

## 2017-12-11 MED ORDER — PALONOSETRON HCL INJECTION 0.25 MG/5ML
0.2500 mg | Freq: Once | INTRAVENOUS | Status: AC
Start: 2017-12-11 — End: 2017-12-11
  Administered 2017-12-11: 0.25 mg via INTRAVENOUS
  Filled 2017-12-11: qty 5

## 2017-12-11 MED ORDER — SODIUM CHLORIDE 0.9 % IV SOLN
Freq: Once | INTRAVENOUS | Status: AC
  Administered 2017-12-11: 10:00:00 via INTRAVENOUS
  Filled 2017-12-11: qty 1000

## 2017-12-11 MED ORDER — OXYCODONE HCL 5 MG PO TABS: 5.0000 mg | ORAL_TABLET | Freq: Four times a day (QID) | ORAL | 0 refills | Status: DC | PRN

## 2017-12-11 MED ORDER — SODIUM CHLORIDE 0.9 % IV SOLN
2400.0000 mg | Freq: Once | INTRAVENOUS | Status: AC
  Administered 2017-12-11: 2400 mg via INTRAVENOUS
  Filled 2017-12-11: qty 10.52

## 2017-12-11 MED ORDER — SODIUM CHLORIDE 0.9% FLUSH
10.0000 mL | INTRAVENOUS | Status: DC | PRN
  Administered 2017-12-11: 10 mL via INTRAVENOUS
  Filled 2017-12-11: qty 10

## 2017-12-11 MED ORDER — GABAPENTIN 300 MG PO CAPS: 300.0000 mg | ORAL_CAPSULE | Freq: Every day | ORAL | 2 refills | Status: DC

## 2017-12-11 MED ORDER — DEXAMETHASONE SODIUM PHOSPHATE 10 MG/ML IJ SOLN
10.0000 mg | Freq: Once | INTRAMUSCULAR | Status: AC
  Administered 2017-12-11: 10 mg via INTRAVENOUS
  Filled 2017-12-11: qty 1

## 2017-12-11 MED ORDER — HEPARIN SOD (PORK) LOCK FLUSH 100 UNIT/ML IV SOLN
500.0000 [IU] | Freq: Once | INTRAVENOUS | Status: AC
  Administered 2017-12-11: 500 [IU] via INTRAVENOUS
  Filled 2017-12-11: qty 5

## 2017-12-17 NOTE — Progress Notes (Signed)
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  INTERVAL HISTORY: Patient returns to clinic today for further evaluation and initiation of cycle 1, day 8 of palliative carboplatinum and gemcitabine.  Gemcitabine only today.  She tolerated her first infusion well without significant side effects.  She continues to have mild weakness and fatigue. She continues to have occasional dizziness. Her peripheral neuropathy is unchanged. She denies any recent fevers or illnesses. She denies any chest pain, cough, hemoptysis, or shortness of breath. She denies nausea, constipation, diarrhea, or vomiting.  She has no urinary complaints. Patient offers no further specific complaints today.  REVIEW OF SYSTEMS:   Review of Systems  Constitutional: Positive for malaise/fatigue. Negative for fever and weight loss.  Respiratory: Negative.  Negative for cough, hemoptysis and shortness of breath.   Cardiovascular: Negative.  Negative for chest pain and leg swelling.  Gastrointestinal: Negative.  Negative for abdominal pain.  Genitourinary: Negative for flank pain.  Musculoskeletal: Negative.  Negative for joint pain and myalgias.  Skin: Negative.  Negative for rash.  Neurological: Positive for dizziness, sensory change and weakness. Negative for headaches.  Psychiatric/Behavioral: Negative.  Negative for memory loss. The patient is not nervous/anxious.     As per HPI. Otherwise, a complete review of systems is negative.  PAST  MEDICAL HISTORY: Past Medical History:  Diagnosis Date  . Arthritis   . Blood dyscrasia   . Cancer of left lung ([place]) 08/2015   Rad + chemo tx's.  . Diabetes mellitus without complication ([place])   . Diabetic neuropathy ([place])   . High cholesterol   . Hypertension   . Insulin dependent diabetes mellitus ([place])     PAST SURGICAL HISTORY: Past Surgical History:  Procedure Laterality Date  . ABDOMINAL HYSTERECTOMY    . CESAREAN SECTION    . ELECTROMAGNETIC NAVIGATION BROCHOSCOPY N/A 11/26/2015   Procedure: ELECTROMAGNETIC NAVIGATION BRONCHOSCOPY;  Surgeon: Flora Lipps, MD;  Location: [facility];  Service: Cardiopulmonary;  Laterality: N/A;  . ENDOBRONCHIAL ULTRASOUND N/A 11/26/2015   Procedure: ENDOBRONCHIAL ULTRASOUND;  Surgeon: Flora Lipps, MD;  Location: [facility];  Service: Cardiopulmonary;  Laterality: N/A;  . PERIPHERAL VASCULAR CATHETERIZATION N/A 12/09/2015   Procedure: Glori Luis Cath Insertion;  Surgeon: Katha Cabal, MD;  Location: [place] [facility];  Service: Cardiovascular;  Laterality: N/A;    FAMILY HISTORY: Reviewed and unchanged. No reported history of malignancy or chronic disease.     ADVANCED DIRECTIVES:    HEALTH MAINTENANCE: Social History   Tobacco Use  . Smoking status: Current Every Day Smoker    Packs/day: 0.30    Years: 35.00    Pack years: 10.50    Types: Cigarettes  . Smokeless tobacco: Never Used  . Tobacco comment: workiing on quitting, down to 2 packs per week now  Substance Use Topics  . Alcohol use: No    Alcohol/week: 0.0 oz  . Drug use: No     No Known Allergies  Current Outpatient Medications  Medication Sig Dispense Refill  . albuterol (PROVENTIL HFA;VENTOLIN HFA) 108 (90 Base) MCG/ACT inhaler Inhale 2 puffs  into the lungs every 6 (six) hours as needed for wheezing or shortness of breath. 1 Inhaler 2  . atorvastatin (LIPITOR) 20 MG tablet Take 20 mg by mouth daily at 6 PM.    . gabapentin (NEURONTIN) 300 MG capsule Take 1 capsule  (300 mg total) by mouth at bedtime. 30 capsule 2  . glimepiride (AMARYL) 4 MG tablet Take by mouth.    . insulin glargine (LANTUS) 100 UNIT/ML injection Inject 60 Units into the skin at bedtime.    Marland Kitchen levothyroxine (SYNTHROID, LEVOTHROID) 150 MCG tablet 200 mcg.     . lidocaine-prilocaine (EMLA) cream Apply to affected area once 30 g 6  . lidocaine-prilocaine (EMLA) cream Apply to affected area once 30 g 3  . linagliptin (TRADJENTA) 5 MG TABS tablet Take 5 mg by mouth daily.     Marland Kitchen lisinopril (PRINIVIL,ZESTRIL) 10 MG tablet Take 10 mg by mouth daily.    . ondansetron (ZOFRAN) 8 MG tablet Take 1 tablet (8 mg total) by mouth 2 (two) times daily as needed for refractory nausea / vomiting. 30 tablet 2  . oxyCODONE (OXY IR/ROXICODONE) 5 MG immediate release tablet Take 1 tablet (5 mg total) by mouth every 6 (six) hours as needed for severe pain. 30 tablet 0  . prochlorperazine (COMPAZINE) 10 MG tablet Take 1 tablet (10 mg total) by mouth every 6 (six) hours as needed (Nausea or vomiting). 60 tablet 2   No current facility-administered medications for this visit.     OBJECTIVE: Vitals:   12/18/17 0946  BP: (!) 156/89  Pulse: 82  Resp: 20  Temp: (!) 95.7 F (35.4 C)     Body mass index is 42.35 kg/m.    ECOG FS:1 - Symptomatic but completely ambulatory  General: Well-developed, well-nourished, no acute distress. Eyes: Pink conjunctiva, anicteric sclera. Lungs: Clear to auscultation bilaterally. Heart: Regular rate and rhythm. No rubs, murmurs, or gallops. Abdomen: Soft, nontender, nondistended. No organomegaly noted, normoactive bowel sounds. Musculoskeletal: No edema, cyanosis, or clubbing. Neuro: Alert, answering all questions appropriately. Cranial nerves grossly intact. Skin: No rashes or petechiae noted. Psych: Normal affect.   LAB RESULTS:  Lab Results  Component Value Date   NA 136 12/18/2017   K 4.6 12/18/2017   CL 105 12/18/2017   CO2 25 12/18/2017   GLUCOSE 144 (H)  12/18/2017   BUN 22 (H) 12/18/2017   CREATININE 0.84 12/18/2017   CALCIUM 9.4 12/18/2017   PROT 6.8 12/18/2017   ALBUMIN 3.4 (L) 12/18/2017   AST 29 12/18/2017   ALT 22 12/18/2017   ALKPHOS 72 12/18/2017   BILITOT 0.7 12/18/2017   GFRNONAA >60 12/18/2017   GFRAA >60 12/18/2017    Lab Results  Component Value Date   WBC 1.3 (LL) 12/18/2017   NEUTROABS 0.6 (L) 12/18/2017   HGB 9.6 (L) 12/18/2017   HCT 28.8 (L) 12/18/2017   MCV 92.5 12/18/2017   PLT 92 (L) 12/18/2017   Lab Results  Component Value Date   IRON 42 07/31/2017   TIBC 280 07/31/2017   IRONPCTSAT 15 07/31/2017    Lab Results  Component Value Date   FERRITIN 81 07/31/2017     STUDIES: Nm Pet Image Restag (ps) Skull Base To Thigh  Result Date: 11/30/2017 CLINICAL DATA:  Subsequent treatment strategy for LEFT lung carcinoma. Patient status post chemotherapy therapy. EXAM: NUCLEAR MEDICINE PET SKULL BASE TO THIGH TECHNIQUE: 13.2 mCi F-18 FDG was injected intravenously. Full-ring PET imaging was performed from the skull base to thigh after the  radiotracer. CT data was obtained and used for attenuation correction and anatomic localization. FASTING BLOOD GLUCOSE:  Value: 117 mg/dl COMPARISON:  PET-CT 9218 FINDINGS: NECK No hypermetabolic lymph nodes in the neck. CHEST Unfortunately there is a new enlarged hypermetabolic subcarinal lymph node measuring 2.7 cm with intense metabolic activity (SUV 14.4) There is new masslike thickening in the upper lobe LEFT upper lobe along the mediastinum superior to the radiation change seen on comparison exam. Majority of this thickening is similar metabolic activity to the mediastinal activity with one focus of increased radiotracer activity SUV max equal 6.1. New small LEFT effusion. No supraclavicular adenopathy. ABDOMEN/PELVIS No abnormal hypermetabolic activity within the liver, pancreas, adrenal glands, or spleen. No hypermetabolic lymph nodes in the abdomen or pelvis. SKELETON No  focal hypermetabolic activity to suggest skeletal metastasis. IMPRESSION: 1. Lung cancer recurrence with new enlarged hypermetabolic subcarinallymph node. 2. Increase consolidation in the medial aspect of the LEFT upper lobe with a focus of metabolic activity is concerning for local recurrence in the LEFT upper lobe. Electronically Signed   By: Suzy Bouchard M.D.   On: 11/30/2017 12:44    Oncology treatment history:  Patient completed her initial treatment with concurrent chemotherapy and XRT on March 07, 2016. She then underwent 2 cycles of consolidation carboplatinum and Taxol completing on Apr 27, 2016.  CT scan results from Apr 18, 2017 revealed progression of disease. She subsequently received additional XRT and proceeded with nivolumab every 2 weeks.  She completed a total of 13 infusions of nivolumab on November 06, 2017.  PET scan on November 30, 2017 revealed progression of disease.  Patient started carboplatinum and gemcitabine on December 11, 2017.  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 PET scan results from November 30, 2017 reviewed independently with new enlarged subcarinal lymph node as well as progression of disease in her left upper lobe.  Nivolumab has been discontinued.  Delay with cycle 1, day 8 of gemcitabine today secondary to thrombocytopenia and neutropenia.  Return to clinic in 1 week for reconsideration of cycle 1, day 8 which will be gemcitabine only.  Will likely have the dose reduce gemcitabine for subsequent cycles.  This will be a 21-day cycle.  Will reimage in March 2019 at the conclusion of cycle 4.   2. Headaches/dizziness: MRI of the brain reviewed independently with no obvious metastatic disease. 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 Okay to proceed from an oncology standpoint.  Patient states she has postponed her appointments with neurosurgery. 3. Anemia: Improving. Previously iron stores were within normal limits, monitor.  4. Thrombocytopenia: Delay treatment as above and dose reduce gemcitabine. 5. Hyperglycemia: Continue diabetic medications as prescribed.  Appreciate endocrinology input.   6. Pain: Patient does not complain of this today. Continue oxycodone as prescribed.  Patient has been instructed to reinitiate her gabapentin and further discuss with her pharmacy since she was confused by receiving a different color capsule. 7. Thyroid: Patient's symptoms have significantly improved since increasing her Synthroid dose. Appreciate endocrinology input who will now be monitoring and adjusting her medications as needed. 8.  Neutropenia: Secondary to chemotherapy.  Delay treatment as above.  Patient expressed understanding and was in agreement with this plan. She also understands that She can call clinic at any time with any questions, concerns, or complaints.  [name], MD   12/20/2017 9:55 AM

## 2017-12-18 ENCOUNTER — Other Ambulatory Visit: Payer: Self-pay

## 2017-12-18 ENCOUNTER — Inpatient Hospital Stay (HOSPITAL_BASED_OUTPATIENT_CLINIC_OR_DEPARTMENT_OTHER): Payer: PPO | Admitting: Oncology

## 2017-12-18 ENCOUNTER — Inpatient Hospital Stay: Payer: PPO

## 2017-12-18 ENCOUNTER — Encounter: Payer: Self-pay | Admitting: Oncology

## 2017-12-18 VITALS — BP 156/89 | HR 82 | Temp 95.7°F | Resp 20 | Wt 262.4 lb

## 2017-12-18 DIAGNOSIS — C3412 Malignant neoplasm of upper lobe, left bronchus or lung: Secondary | ICD-10-CM | POA: Diagnosis not present

## 2017-12-18 DIAGNOSIS — G629 Polyneuropathy, unspecified: Secondary | ICD-10-CM

## 2017-12-18 DIAGNOSIS — R531 Weakness: Secondary | ICD-10-CM | POA: Diagnosis not present

## 2017-12-18 DIAGNOSIS — D701 Agranulocytosis secondary to cancer chemotherapy: Secondary | ICD-10-CM | POA: Diagnosis not present

## 2017-12-18 DIAGNOSIS — E1165 Type 2 diabetes mellitus with hyperglycemia: Secondary | ICD-10-CM

## 2017-12-18 DIAGNOSIS — D696 Thrombocytopenia, unspecified: Secondary | ICD-10-CM

## 2017-12-18 DIAGNOSIS — R5383 Other fatigue: Secondary | ICD-10-CM | POA: Diagnosis not present

## 2017-12-18 DIAGNOSIS — Z5111 Encounter for antineoplastic chemotherapy: Secondary | ICD-10-CM | POA: Diagnosis not present

## 2017-12-18 LAB — CBC WITH DIFFERENTIAL/PLATELET
BASOS PCT: 1 %
Basophils Absolute: 0 10*3/uL (ref 0–0.1)
EOS ABS: 0 10*3/uL (ref 0–0.7)
Eosinophils Relative: 3 %
HCT: 28.8 % — ABNORMAL LOW (ref 35.0–47.0)
HEMOGLOBIN: 9.6 g/dL — AB (ref 12.0–16.0)
Lymphocytes Relative: 48 %
Lymphs Abs: 0.6 10*3/uL — ABNORMAL LOW (ref 1.0–3.6)
MCH: 30.8 pg (ref 26.0–34.0)
MCHC: 33.3 g/dL (ref 32.0–36.0)
MCV: 92.5 fL (ref 80.0–100.0)
Monocytes Absolute: 0 10*3/uL — ABNORMAL LOW (ref 0.2–0.9)
Monocytes Relative: 1 %
NEUTROS PCT: 47 %
Neutro Abs: 0.6 10*3/uL — ABNORMAL LOW (ref 1.4–6.5)
Platelets: 92 10*3/uL — ABNORMAL LOW (ref 150–440)
RBC: 3.11 MIL/uL — AB (ref 3.80–5.20)
RDW: 14.2 % (ref 11.5–14.5)
WBC: 1.3 10*3/uL — CL (ref 3.6–11.0)

## 2017-12-18 LAB — COMPREHENSIVE METABOLIC PANEL
ALBUMIN: 3.4 g/dL — AB (ref 3.5–5.0)
ALK PHOS: 72 U/L (ref 38–126)
ALT: 22 U/L (ref 14–54)
AST: 29 U/L (ref 15–41)
Anion gap: 6 (ref 5–15)
BUN: 22 mg/dL — AB (ref 6–20)
CHLORIDE: 105 mmol/L (ref 101–111)
CO2: 25 mmol/L (ref 22–32)
Calcium: 9.4 mg/dL (ref 8.9–10.3)
Creatinine, Ser: 0.84 mg/dL (ref 0.44–1.00)
GFR calc Af Amer: >60 mL/min (ref >60)
GFR calc non Af Amer: >60 mL/min (ref >60)
Glucose, Bld: 144 mg/dL — ABNORMAL HIGH (ref 65–99)
POTASSIUM: 4.6 mmol/L (ref 3.5–5.1)
SODIUM: 136 mmol/L (ref 135–145)
TOTAL PROTEIN: 6.8 g/dL (ref 6.5–8.1)
Total Bilirubin: 0.7 mg/dL (ref 0.3–1.2)

## 2017-12-18 MED ORDER — HEPARIN SOD (PORK) LOCK FLUSH 100 UNIT/ML IV SOLN
500.0000 [IU] | Freq: Once | INTRAVENOUS | Status: AC
  Administered 2017-12-18: 500 [IU] via INTRAVENOUS
  Filled 2017-12-18: qty 5

## 2017-12-18 MED ORDER — SODIUM CHLORIDE 0.9% FLUSH
10.0000 mL | INTRAVENOUS | Status: DC | PRN
  Administered 2017-12-18 (×2): 10 mL via INTRAVENOUS
  Filled 2017-12-18: qty 10

## 2017-12-18 NOTE — Progress Notes (Signed)
Patient reports improvement in leg weakness and "full feeling". Patient reports decreased appetite.

## 2017-12-18 NOTE — Progress Notes (Signed)
No treatment today per Tillie Rung RN per Dr. Grayland Ormond.

## 2017-12-21 ENCOUNTER — Ambulatory Visit
Admission: RE | Admit: 2017-12-21 | Discharge: 2017-12-21 | Disposition: A | Discharge status: Home / Self Care | Payer: PPO | Source: Ambulatory Visit | Attending: Radiation Oncology | Admitting: Radiation Oncology

## 2017-12-21 ENCOUNTER — Other Ambulatory Visit: Payer: Self-pay

## 2017-12-21 ENCOUNTER — Encounter: Payer: Self-pay | Admitting: Radiation Oncology

## 2017-12-21 VITALS — BP 110/67 | HR 97 | Temp 98.0°F | Resp 18 | Wt 256.4 lb

## 2017-12-21 DIAGNOSIS — C3412 Malignant neoplasm of upper lobe, left bronchus or lung: Secondary | ICD-10-CM | POA: Diagnosis not present

## 2017-12-21 DIAGNOSIS — Z923 Personal history of irradiation: Secondary | ICD-10-CM | POA: Diagnosis not present

## 2017-12-21 DIAGNOSIS — F17210 Nicotine dependence, cigarettes, uncomplicated: Secondary | ICD-10-CM | POA: Diagnosis not present

## 2017-12-21 DIAGNOSIS — R51 Headache: Secondary | ICD-10-CM | POA: Insufficient documentation

## 2017-12-21 NOTE — Progress Notes (Signed)
Radiation Oncology Follow up Note  Name: Susan Willis   Date:   12/21/2017 MRN:  [id number] DOB: Jul 03, 1951    This 67 y.o. 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  REFERRING PROVIDER: Herminio Commons, MD  HPI: Patient is a 66 year old female initially treated back in. 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 Initial tumor was T4 N2. She developed persistent hypermetabolic activity on PET CT scan and we retreated with salvage concurrent chemoradiation now out approximately 8 months. Clinically she is doing well. She specifically denies cough hemoptysis or chest tightness. Unfortunately most recent PET CT scan shows new enlarged hypermetabolic subcarinal lymph nodes and increased consolidation medial aspect of the left upper lobe with a focus of hypermetabolic activity again concerning for recurrence. She has been on immunotherapy although recently has been switched to single agent gemcitabine. She was having headaches although MRI of her brain was unremarkable.  COMPLICATIONS OF TREATMENT: none  FOLLOW UP COMPLIANCE: keeps appointments   PHYSICAL EXAM:  BP 110/67   Pulse 97   Temp 98 F (36.7 C)   Resp 18   Wt 256 lb 6.3 oz (116.3 kg)   BMI 41.38 kg/m  Well-developed obese female in NAD. Well-developed well-nourished patient in NAD. HEENT reveals PERLA, EOMI, discs not visualized.  Oral cavity is clear. No oral mucosal lesions are identified. Neck is clear without evidence of cervical or supraclavicular adenopathy. Lungs are clear to A&P. Cardiac examination is essentially unremarkable with regular rate and rhythm without murmur rub or thrill. Abdomen is benign with no organomegaly or masses noted. Motor sensory and DTR levels are equal and symmetric in the upper and lower extremities. Cranial nerves II through XII are grossly intact.  Proprioception is intact. No peripheral adenopathy or edema is identified. No motor or sensory levels are noted. Crude visual fields are within normal range.  RADIOLOGY RESULTS: PET CT scan is reviewed and compatible with the above-stated findings  PLAN: At this time patient will continue with palliative chemotherapy under medical oncology's direction. I will reevaluate her any time should further palliative treatment be indicated. I've established a 6 month follow-up appointment with her. She knows to call sooner with any concerns.  I would like to take this opportunity to thank you for allowing me to participate in the care of your patient.[name], MD

## 2017-12-24 NOTE — Progress Notes (Signed)
[facility]  Telephone:(336) [phone] [phone]  ID: Susan Willis OB: March 08, 1951  MR#: [id number]  QIO#:[id number]  Patient Care Team: Patient, No Pcp Per as PCP - General (General Practice) [name], [name] [name], [name] [name], [name], [name], [name]  CHIEF COMPLAINT: Stage IIIa squamous cell carcinoma of the upper lobe of left lung.  INTERVAL HISTORY: Patient returns to clinic today for further evaluation and reconsideration of cycle 1, day 8 of palliative carboplatinum and gemcitabine.  Gemcitabine only today.  She continues to have a chronic cough and congestion, but otherwise feels well.  She has mild weakness and fatigue. She continues to have occasional dizziness. Her peripheral neuropathy is unchanged. She denies any recent fevers or illnesses. She denies any chest pain or shortness of breath. She denies nausea, constipation, diarrhea, or vomiting.  She has no urinary complaints. Patient offers no further specific complaints today.  REVIEW OF SYSTEMS:   Review of Systems  Constitutional: Positive for malaise/fatigue. Negative for fever and weight loss.  HENT: Positive for congestion.   Respiratory: Positive for cough. Negative for hemoptysis and shortness of breath.   Cardiovascular: Negative.  Negative for chest pain and leg swelling.  Gastrointestinal: Negative.  Negative for abdominal pain.  Genitourinary: Negative for flank pain.  Musculoskeletal: Negative.  Negative for joint pain and myalgias.  Skin: Negative.  Negative for rash.  Neurological: Positive for dizziness, sensory change and weakness. Negative for headaches.  Psychiatric/Behavioral: Negative.  Negative for memory loss. The patient is not nervous/anxious.     As per HPI. Otherwise, a complete review of systems is  negative.  PAST MEDICAL HISTORY: Past Medical History:  Diagnosis Date  . Arthritis   . Blood dyscrasia   . Cancer of left lung ([place]) 08/2015   Rad + chemo tx's.  . Diabetes mellitus without complication ([place])   . Diabetic neuropathy ([place])   . High cholesterol   . Hypertension   . Insulin dependent diabetes mellitus ([place])     PAST SURGICAL HISTORY: Past Surgical History:  Procedure Laterality Date  . ABDOMINAL HYSTERECTOMY    . CESAREAN SECTION    . ELECTROMAGNETIC NAVIGATION BROCHOSCOPY N/A 11/26/2015   Procedure: ELECTROMAGNETIC NAVIGATION BRONCHOSCOPY;  Surgeon: Flora Lipps, MD;  Location: [facility] ORS;  Service: Cardiopulmonary;  Laterality: N/A;  . ENDOBRONCHIAL ULTRASOUND N/A 11/26/2015   Procedure: ENDOBRONCHIAL ULTRASOUND;  Surgeon: Flora Lipps, MD;  Location: [facility] OR[facility];  Service: Cardiopulmonary;  Laterality: N/A;  . PERIPHERAL VASCULAR CATHETERIZATION N/A 12/09/2015   Procedure: Glori Luis Cath Insertion;  Surgeon: Katha Cabal, MD;  Location: [facility];  Service: Cardiovascular;  Laterality: N/A;    FAMILY HISTORY: Reviewed and unchanged. No reported history of malignancy or chronic disease.     ADVANCED DIRECTIVES:    HEALTH MAINTENANCE: Social History   Tobacco Use  . Smoking status: Current Every Day Smoker    Packs/day: 0.30    Years: 35.00    Pack years: 10.50    Types: Cigarettes  . Smokeless tobacco: Never Used  . Tobacco comment: workiing on quitting, down to 2 packs per week now  Substance Use Topics  . Alcohol use: No    Alcohol/week: 0.0 oz  . Drug use: No     No Known Allergies  Current Outpatient Medications  Medication Sig Dispense Refill  . albuterol (PROVENTIL HFA;VENTOLIN HFA) 108 (90 Base)  MCG/ACT inhaler Inhale 2 puffs into the lungs every 6 (six) hours as needed for wheezing or shortness of breath. 1 Inhaler 2  . atorvastatin (LIPITOR) 20 MG tablet Take 20 mg by mouth daily at 6 PM.    . gabapentin (NEURONTIN) 300 MG  capsule Take 1 capsule (300 mg total) by mouth at bedtime. 30 capsule 2  . glimepiride (AMARYL) 4 MG tablet Take by mouth.    . insulin glargine (LANTUS) 100 UNIT/ML injection Inject 60 Units into the skin at bedtime.    Marland Kitchen levothyroxine (SYNTHROID, LEVOTHROID) 150 MCG tablet 200 mcg.     . lidocaine-prilocaine (EMLA) cream Apply to affected area once 30 g 6  . lidocaine-prilocaine (EMLA) cream Apply to affected area once 30 g 3  . linagliptin (TRADJENTA) 5 MG TABS tablet Take 5 mg by mouth daily.     Marland Kitchen lisinopril (PRINIVIL,ZESTRIL) 10 MG tablet Take 10 mg by mouth daily.    . ondansetron (ZOFRAN) 8 MG tablet Take 1 tablet (8 mg total) by mouth 2 (two) times daily as needed for refractory nausea / vomiting. 30 tablet 2  . oxyCODONE (OXY IR/ROXICODONE) 5 MG immediate release tablet Take 1 tablet (5 mg total) by mouth every 6 (six) hours as needed for severe pain. 30 tablet 0  . prochlorperazine (COMPAZINE) 10 MG tablet Take 1 tablet (10 mg total) by mouth every 6 (six) hours as needed (Nausea or vomiting). 60 tablet 2  . levofloxacin (LEVAQUIN) 500 MG tablet Take 1 tablet (500 mg total) by mouth daily. 7 tablet 0   No current facility-administered medications for this visit.    Facility-Administered Medications Ordered in Other Visits  Medication Dose Route Frequency Provider Last Rate Last Dose  . gemcitabine (GEMZAR) 1,800 mg in sodium chloride 0.9 % 250 mL chemo infusion  1,800 mg Intravenous Once [name], MD        OBJECTIVE: Vitals:   12/25/17 1115  BP: 130/78  Pulse: 78  Resp: 18  Temp: (!) 97.1 F (36.2 C)     Body mass index is 41.27 kg/m.    ECOG FS:1 - Symptomatic but completely ambulatory  General: Well-developed, well-nourished, no acute distress. Eyes: Pink conjunctiva, anicteric sclera. Lungs: Clear to auscultation bilaterally. Heart: Regular rate and rhythm. No rubs, murmurs, or gallops. Abdomen: Soft, nontender, nondistended. No organomegaly noted,  normoactive bowel sounds. Musculoskeletal: No edema, cyanosis, or clubbing. Neuro: Alert, answering all questions appropriately. Cranial nerves grossly intact. Skin: No rashes or petechiae noted. Psych: Normal affect.   LAB RESULTS:  Lab Results  Component Value Date   NA 135 12/25/2017   K 4.3 12/25/2017   CL 103 12/25/2017   CO2 25 12/25/2017   GLUCOSE 248 (H) 12/25/2017   BUN 15 12/25/2017   CREATININE 0.92 12/25/2017   CALCIUM 8.9 12/25/2017   PROT 7.1 12/25/2017   ALBUMIN 3.1 (L) 12/25/2017   AST 68 (H) 12/25/2017   ALT 75 (H) 12/25/2017   ALKPHOS 122 12/25/2017   BILITOT 0.4 12/25/2017   GFRNONAA >60 12/25/2017   GFRAA >60 12/25/2017    Lab Results  Component Value Date   WBC 1.9 (L) 12/25/2017   NEUTROABS 0.9 (L) 12/25/2017   HGB 8.5 (L) 12/25/2017   HCT 25.8 (L) 12/25/2017   MCV 91.1 12/25/2017   PLT 79 (L) 12/25/2017   Lab Results  Component Value Date   IRON 42 07/31/2017   TIBC 280 07/31/2017   IRONPCTSAT 15 07/31/2017    Lab Results  Component Value Date   FERRITIN 81 07/31/2017     STUDIES: Nm Pet Image Restag (ps) Skull Base To Thigh  Result Date: 11/30/2017 CLINICAL DATA:  Subsequent treatment strategy for LEFT lung carcinoma. Patient status post chemotherapy therapy. EXAM: NUCLEAR MEDICINE PET SKULL BASE TO THIGH TECHNIQUE: 13.2 mCi F-18 FDG was injected intravenously. Full-ring PET imaging was performed from the skull base to thigh after the radiotracer. CT data was obtained and used for attenuation correction and anatomic localization. FASTING BLOOD GLUCOSE:  Value: 117 mg/dl COMPARISON:  PET-CT 9218 FINDINGS: NECK No hypermetabolic lymph nodes in the neck. CHEST Unfortunately there is a new enlarged hypermetabolic subcarinal lymph node measuring 2.7 cm with intense metabolic activity (SUV 54.6) There is new masslike thickening in the upper lobe LEFT upper lobe along the mediastinum superior to the radiation change seen on comparison exam.  Majority of this thickening is similar metabolic activity to the mediastinal activity with one focus of increased radiotracer activity SUV max equal 6.1. New small LEFT effusion. No supraclavicular adenopathy. ABDOMEN/PELVIS No abnormal hypermetabolic activity within the liver, pancreas, adrenal glands, or spleen. No hypermetabolic lymph nodes in the abdomen or pelvis. SKELETON No focal hypermetabolic activity to suggest skeletal metastasis. IMPRESSION: 1. Lung cancer recurrence with new enlarged hypermetabolic subcarinallymph node. 2. Increase consolidation in the medial aspect of the LEFT upper lobe with a focus of metabolic activity is concerning for local recurrence in the LEFT upper lobe. Electronically Signed   By: Suzy Bouchard M.D.   On: 11/30/2017 12:44    Oncology treatment history:  Patient completed her initial treatment with concurrent chemotherapy and XRT on March 07, 2016. She then underwent 2 cycles of consolidation carboplatinum and Taxol completing on Apr 27, 2016.  CT scan results from Apr 18, 2017 revealed progression of disease. She subsequently received additional XRT and proceeded with nivolumab every 2 weeks.  She completed a total of 13 infusions of nivolumab on November 06, 2017.  PET scan on November 30, 2017 revealed progression of disease.  Patient started carboplatinum and gemcitabine on December 11, 2017.  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 PET scan results from November 30, 2017 reviewed independently with new enlarged subcarinal lymph node as well as progression of disease in her left upper lobe.  Nivolumab has been discontinued.  Proceed with cycle 1, day 8 of gemcitabine today secondary despite thrombocytopenia and neutropenia.  Gemcitabine has been dose reduced 20%.  Return to clinic in 1 week for laboratory work  only in 2 weeks for laboratory work and consideration of cycle 2, day 1 which will be carboplatinum and gemcitabine.  And then in 3 weeks for further evaluation and consideration of cycle 2, day 8 gemcitabine only.  Will reimage in March 2019 at the conclusion of cycle 4.   2. Headaches/dizziness: MRI of the brain reviewed independently with no obvious metastatic disease. 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 Okay to proceed from an oncology standpoint. Patient states she has postponed her appointments with neurosurgery. 3. Anemia: Improving. Previously iron stores were within normal limits, monitor.  4. Thrombocytopenia: Proceed with treatment as above and dose reduce gemcitabine. 5. Hyperglycemia: Continue diabetic medications as prescribed.  Appreciate endocrinology input.   6. Pain: Patient does not complain of this today. Continue oxycodone as prescribed.  Patient has  been instructed to reinitiate her gabapentin and further discuss with her pharmacy since she was confused by receiving a different color capsule. 7. Thyroid: Patient's symptoms have significantly improved since increasing her Synthroid dose. Appreciate endocrinology input who will now be monitoring and adjusting her medications as needed. 8.  Neutropenia: Secondary to chemotherapy.  Proceed with treatment as above. 9.  Cough/congestion: Patient was instructed to use her inhaler as prescribed.  She was given a prescription for Levaquin and recommended she use OTC cough remedies.  Patient expressed understanding and was in agreement with this plan. She also understands that She can call clinic at any time with any questions, concerns, or complaints.    [name], MD   12/25/2017 11:53 AM

## 2017-12-25 ENCOUNTER — Inpatient Hospital Stay: Payer: PPO

## 2017-12-25 ENCOUNTER — Other Ambulatory Visit: Payer: Self-pay

## 2017-12-25 ENCOUNTER — Inpatient Hospital Stay (HOSPITAL_BASED_OUTPATIENT_CLINIC_OR_DEPARTMENT_OTHER): Payer: PPO | Admitting: Oncology

## 2017-12-25 ENCOUNTER — Encounter: Payer: Self-pay | Admitting: Oncology

## 2017-12-25 VITALS — BP 130/78 | HR 78 | Temp 97.1°F | Resp 18 | Wt 255.7 lb

## 2017-12-25 DIAGNOSIS — D701 Agranulocytosis secondary to cancer chemotherapy: Secondary | ICD-10-CM | POA: Diagnosis not present

## 2017-12-25 DIAGNOSIS — D649 Anemia, unspecified: Secondary | ICD-10-CM

## 2017-12-25 DIAGNOSIS — D696 Thrombocytopenia, unspecified: Secondary | ICD-10-CM | POA: Diagnosis not present

## 2017-12-25 DIAGNOSIS — C3412 Malignant neoplasm of upper lobe, left bronchus or lung: Secondary | ICD-10-CM | POA: Diagnosis not present

## 2017-12-25 DIAGNOSIS — Z5111 Encounter for antineoplastic chemotherapy: Secondary | ICD-10-CM | POA: Diagnosis not present

## 2017-12-25 LAB — COMPREHENSIVE METABOLIC PANEL
ALBUMIN: 3.1 g/dL — AB (ref 3.5–5.0)
ALT: 75 U/L — ABNORMAL HIGH (ref 14–54)
AST: 68 U/L — AB (ref 15–41)
Alkaline Phosphatase: 122 U/L (ref 38–126)
Anion gap: 7 (ref 5–15)
BILIRUBIN TOTAL: 0.4 mg/dL (ref 0.3–1.2)
BUN: 15 mg/dL (ref 6–20)
CHLORIDE: 103 mmol/L (ref 101–111)
CO2: 25 mmol/L (ref 22–32)
Calcium: 8.9 mg/dL (ref 8.9–10.3)
Creatinine, Ser: 0.92 mg/dL (ref 0.44–1.00)
GFR calc Af Amer: >60 mL/min (ref >60)
GFR calc non Af Amer: >60 mL/min (ref >60)
GLUCOSE: 248 mg/dL — AB (ref 65–99)
Potassium: 4.3 mmol/L (ref 3.5–5.1)
Sodium: 135 mmol/L (ref 135–145)
TOTAL PROTEIN: 7.1 g/dL (ref 6.5–8.1)

## 2017-12-25 LAB — CBC WITH DIFFERENTIAL/PLATELET
Basophils Absolute: 0 10*3/uL (ref 0–0.1)
Basophils Relative: 0 %
EOS PCT: 1 %
Eosinophils Absolute: 0 10*3/uL (ref 0–0.7)
HCT: 25.8 % — ABNORMAL LOW (ref 35.0–47.0)
Hemoglobin: 8.5 g/dL — ABNORMAL LOW (ref 12.0–16.0)
LYMPHS ABS: 0.6 10*3/uL — AB (ref 1.0–3.6)
Lymphocytes Relative: 32 %
MCH: 30.1 pg (ref 26.0–34.0)
MCHC: 33 g/dL (ref 32.0–36.0)
MCV: 91.1 fL (ref 80.0–100.0)
MONO ABS: 0.4 10*3/uL (ref 0.2–0.9)
Monocytes Relative: 20 %
Neutro Abs: 0.9 10*3/uL — ABNORMAL LOW (ref 1.4–6.5)
Neutrophils Relative %: 47 %
PLATELETS: 79 10*3/uL — AB (ref 150–440)
RBC: 2.84 MIL/uL — ABNORMAL LOW (ref 3.80–5.20)
RDW: 13.5 % (ref 11.5–14.5)
WBC: 1.9 10*3/uL — ABNORMAL LOW (ref 3.6–11.0)

## 2017-12-25 MED ORDER — HEPARIN SOD (PORK) LOCK FLUSH 100 UNIT/ML IV SOLN
500.0000 [IU] | Freq: Once | INTRAVENOUS | Status: AC
  Administered 2017-12-25: 500 [IU] via INTRAVENOUS

## 2017-12-25 MED ORDER — DEXAMETHASONE SODIUM PHOSPHATE 10 MG/ML IJ SOLN
10.0000 mg | Freq: Once | INTRAMUSCULAR | Status: AC
  Administered 2017-12-25: 10 mg via INTRAVENOUS
  Filled 2017-12-25: qty 1

## 2017-12-25 MED ORDER — LEVOFLOXACIN 500 MG PO TABS: 500.0000 mg | ORAL_TABLET | Freq: Every day | ORAL | 0 refills | Status: DC

## 2017-12-25 MED ORDER — OXYCODONE HCL 5 MG PO TABS: 5.0000 mg | ORAL_TABLET | Freq: Four times a day (QID) | ORAL | 0 refills | Status: DC | PRN

## 2017-12-25 MED ORDER — PROCHLORPERAZINE MALEATE 10 MG PO TABS
10.0000 mg | ORAL_TABLET | Freq: Once | ORAL | Status: AC
  Administered 2017-12-25: 10 mg via ORAL
  Filled 2017-12-25: qty 1

## 2017-12-25 MED ORDER — SODIUM CHLORIDE 0.9 % IV SOLN
1800.0000 mg | Freq: Once | INTRAVENOUS | Status: AC
  Administered 2017-12-25: 1800 mg via INTRAVENOUS
  Filled 2017-12-25: qty 26.3

## 2017-12-25 MED ORDER — SODIUM CHLORIDE 0.9 % IV SOLN
Freq: Once | INTRAVENOUS | Status: AC
  Administered 2017-12-25: 12:00:00 via INTRAVENOUS
  Filled 2017-12-25: qty 1000

## 2017-12-25 NOTE — Progress Notes (Signed)
Patient reports cough x 2 weeks today.

## 2018-01-01 ENCOUNTER — Inpatient Hospital Stay: Payer: PPO

## 2018-01-01 DIAGNOSIS — C3412 Malignant neoplasm of upper lobe, left bronchus or lung: Secondary | ICD-10-CM

## 2018-01-01 DIAGNOSIS — Z5111 Encounter for antineoplastic chemotherapy: Secondary | ICD-10-CM | POA: Diagnosis not present

## 2018-01-01 LAB — CBC WITH DIFFERENTIAL/PLATELET
BASOS ABS: 0 10*3/uL (ref 0–0.1)
BASOS PCT: 0 %
EOS ABS: 0 10*3/uL (ref 0–0.7)
EOS PCT: 0 %
HCT: 25.9 % — ABNORMAL LOW (ref 35.0–47.0)
Hemoglobin: 8.7 g/dL — ABNORMAL LOW (ref 12.0–16.0)
LYMPHS PCT: 33 %
Lymphs Abs: 0.8 10*3/uL — ABNORMAL LOW (ref 1.0–3.6)
MCH: 30.3 pg (ref 26.0–34.0)
MCHC: 33.4 g/dL (ref 32.0–36.0)
MCV: 90.7 fL (ref 80.0–100.0)
MONO ABS: 0.2 10*3/uL (ref 0.2–0.9)
Monocytes Relative: 9 %
Neutro Abs: 1.4 10*3/uL (ref 1.4–6.5)
Neutrophils Relative %: 58 %
PLATELETS: 121 10*3/uL — AB (ref 150–440)
RBC: 2.85 MIL/uL — AB (ref 3.80–5.20)
RDW: 13.3 % (ref 11.5–14.5)
WBC: 2.4 10*3/uL — AB (ref 3.6–11.0)

## 2018-01-01 LAB — COMPREHENSIVE METABOLIC PANEL
ALBUMIN: 3.2 g/dL — AB (ref 3.5–5.0)
ALT: 22 U/L (ref 14–54)
AST: 17 U/L (ref 15–41)
Alkaline Phosphatase: 111 U/L (ref 38–126)
Anion gap: 7 (ref 5–15)
BILIRUBIN TOTAL: 0.3 mg/dL (ref 0.3–1.2)
BUN: 16 mg/dL (ref 6–20)
CALCIUM: 9.4 mg/dL (ref 8.9–10.3)
CO2: 30 mmol/L (ref 22–32)
CREATININE: 0.89 mg/dL (ref 0.44–1.00)
Chloride: 101 mmol/L (ref 101–111)
GFR calc Af Amer: >60 mL/min (ref >60)
GLUCOSE: 169 mg/dL — AB (ref 65–99)
Potassium: 4.7 mmol/L (ref 3.5–5.1)
Sodium: 138 mmol/L (ref 135–145)
TOTAL PROTEIN: 7.2 g/dL (ref 6.5–8.1)

## 2018-01-08 ENCOUNTER — Other Ambulatory Visit: Payer: PPO

## 2018-01-08 ENCOUNTER — Ambulatory Visit: Payer: PPO | Admitting: Nurse Practitioner

## 2018-01-08 ENCOUNTER — Inpatient Hospital Stay: Payer: PPO

## 2018-01-08 ENCOUNTER — Inpatient Hospital Stay: Payer: PPO | Attending: Oncology

## 2018-01-08 ENCOUNTER — Ambulatory Visit: Payer: PPO

## 2018-01-08 DIAGNOSIS — E1165 Type 2 diabetes mellitus with hyperglycemia: Secondary | ICD-10-CM | POA: Insufficient documentation

## 2018-01-08 DIAGNOSIS — D701 Agranulocytosis secondary to cancer chemotherapy: Secondary | ICD-10-CM | POA: Insufficient documentation

## 2018-01-08 DIAGNOSIS — R42 Dizziness and giddiness: Secondary | ICD-10-CM | POA: Insufficient documentation

## 2018-01-08 DIAGNOSIS — C3412 Malignant neoplasm of upper lobe, left bronchus or lung: Secondary | ICD-10-CM | POA: Diagnosis not present

## 2018-01-08 DIAGNOSIS — Z5111 Encounter for antineoplastic chemotherapy: Secondary | ICD-10-CM | POA: Insufficient documentation

## 2018-01-08 DIAGNOSIS — D696 Thrombocytopenia, unspecified: Secondary | ICD-10-CM | POA: Insufficient documentation

## 2018-01-08 DIAGNOSIS — R05 Cough: Secondary | ICD-10-CM | POA: Insufficient documentation

## 2018-01-08 DIAGNOSIS — D649 Anemia, unspecified: Secondary | ICD-10-CM | POA: Diagnosis not present

## 2018-01-08 LAB — CBC WITH DIFFERENTIAL/PLATELET
BASOS PCT: 1 %
Basophils Absolute: 0 10*3/uL (ref 0–0.1)
EOS ABS: 0 10*3/uL (ref 0–0.7)
Eosinophils Relative: 1 %
HCT: 27.9 % — ABNORMAL LOW (ref 35.0–47.0)
HEMOGLOBIN: 9.3 g/dL — AB (ref 12.0–16.0)
Lymphocytes Relative: 22 %
Lymphs Abs: 0.7 10*3/uL — ABNORMAL LOW (ref 1.0–3.6)
MCH: 30.5 pg (ref 26.0–34.0)
MCHC: 33.3 g/dL (ref 32.0–36.0)
MCV: 91.7 fL (ref 80.0–100.0)
Monocytes Absolute: 0.4 10*3/uL (ref 0.2–0.9)
Monocytes Relative: 12 %
NEUTROS PCT: 64 %
Neutro Abs: 2.1 10*3/uL (ref 1.4–6.5)
PLATELETS: 264 10*3/uL (ref 150–440)
RBC: 3.04 MIL/uL — AB (ref 3.80–5.20)
RDW: 13.9 % (ref 11.5–14.5)
WBC: 3.2 10*3/uL — AB (ref 3.6–11.0)

## 2018-01-08 LAB — COMPREHENSIVE METABOLIC PANEL
ALBUMIN: 3.4 g/dL — AB (ref 3.5–5.0)
ALK PHOS: 99 U/L (ref 38–126)
ALT: 18 U/L (ref 14–54)
ANION GAP: 10 (ref 5–15)
AST: 25 U/L (ref 15–41)
BUN: 19 mg/dL (ref 6–20)
CALCIUM: 9 mg/dL (ref 8.9–10.3)
CHLORIDE: 102 mmol/L (ref 101–111)
CO2: 25 mmol/L (ref 22–32)
Creatinine, Ser: 0.95 mg/dL (ref 0.44–1.00)
GFR calc Af Amer: >60 mL/min (ref >60)
GFR calc non Af Amer: >60 mL/min (ref >60)
GLUCOSE: 230 mg/dL — AB (ref 65–99)
Potassium: 4.2 mmol/L (ref 3.5–5.1)
SODIUM: 137 mmol/L (ref 135–145)
Total Bilirubin: 0.4 mg/dL (ref 0.3–1.2)
Total Protein: 7.1 g/dL (ref 6.5–8.1)

## 2018-01-08 MED ORDER — DEXAMETHASONE SODIUM PHOSPHATE 10 MG/ML IJ SOLN
10.0000 mg | Freq: Once | INTRAMUSCULAR | Status: AC
  Administered 2018-01-08: 10 mg via INTRAVENOUS
  Filled 2018-01-08: qty 1

## 2018-01-08 MED ORDER — SODIUM CHLORIDE 0.9 % IV SOLN
1800.0000 mg | Freq: Once | INTRAVENOUS | Status: AC
  Administered 2018-01-08: 1800 mg via INTRAVENOUS
  Filled 2018-01-08: qty 26.3

## 2018-01-08 MED ORDER — HEPARIN SOD (PORK) LOCK FLUSH 100 UNIT/ML IV SOLN
500.0000 [IU] | Freq: Once | INTRAVENOUS | Status: AC | PRN
  Administered 2018-01-08: 500 [IU]
  Filled 2018-01-08: qty 5

## 2018-01-08 MED ORDER — PALONOSETRON HCL INJECTION 0.25 MG/5ML
0.2500 mg | Freq: Once | INTRAVENOUS | Status: AC
  Administered 2018-01-08: 0.25 mg via INTRAVENOUS
  Filled 2018-01-08: qty 5

## 2018-01-08 MED ORDER — SODIUM CHLORIDE 0.9 % IV SOLN
Freq: Once | INTRAVENOUS | Status: AC
  Administered 2018-01-08: 14:00:00 via INTRAVENOUS
  Filled 2018-01-08: qty 1000

## 2018-01-08 MED ORDER — SODIUM CHLORIDE 0.9 % IV SOLN
625.0000 mg | Freq: Once | INTRAVENOUS | Status: AC
  Administered 2018-01-08: 630 mg via INTRAVENOUS
  Filled 2018-01-08: qty 63

## 2018-01-12 NOTE — Progress Notes (Signed)
[facility]  Telephone:(336) [phone] Fax:(336) [phone]  ID: Susan Willis OB: 08/22/1951  MR#: [id number]  AOZ#:[id number]  Patient Care Team: Marguerita Merles, MD as PCP - General (Family Medicine) [name], [name] [name], [name] [name], [name], [name], [name]  CHIEF COMPLAINT: Stage IIIa squamous cell carcinoma of the upper lobe of left lung.  INTERVAL HISTORY: Patient returns to clinic today for further evaluation and reconsideration of cycle 2, day 8 of palliative carboplatinum and gemcitabine.  Gemcitabine only today.  She has noticed increased weakness and fatigue in conjunction with a poor appetite over the last 3-4 days.  She also has worsening mouth sores. She continues to have occasional dizziness. Her peripheral neuropathy is unchanged. She denies any recent fevers or illnesses. She denies any chest pain or shortness of breath. She denies nausea, constipation, diarrhea, or vomiting.  She has no urinary complaints. Patient offers no further specific complaints today.  REVIEW OF SYSTEMS:   Review of Systems  Constitutional: Positive for malaise/fatigue. Negative for fever and weight loss.  HENT: Positive for congestion.   Respiratory: Positive for cough. Negative for hemoptysis and shortness of breath.   Cardiovascular: Negative.  Negative for chest pain and leg swelling.  Gastrointestinal: Negative.  Negative for abdominal pain.  Genitourinary: Negative for flank pain.  Musculoskeletal: Negative.  Negative for joint pain and myalgias.  Skin: Negative.  Negative for rash.  Neurological: Positive for dizziness, sensory change and weakness. Negative for headaches.  Psychiatric/Behavioral: Negative.  Negative for memory loss. The patient is not nervous/anxious.     As per HPI. Otherwise, a complete  review of systems is negative.  PAST MEDICAL HISTORY: Past Medical History:  Diagnosis Date  . Arthritis   . Blood dyscrasia   . Cancer of left lung ([place]) 08/2015   Rad + chemo tx's.  . Diabetes mellitus without complication ([place])   . Diabetic neuropathy ([place])   . High cholesterol   . Hypertension   . Insulin dependent diabetes mellitus ([place])     PAST SURGICAL HISTORY: Past Surgical History:  Procedure Laterality Date  . ABDOMINAL HYSTERECTOMY    . CESAREAN SECTION    . ELECTROMAGNETIC NAVIGATION BROCHOSCOPY N/A 11/26/2015   Procedure: ELECTROMAGNETIC NAVIGATION BRONCHOSCOPY;  Surgeon: Flora Lipps, MD;  Location: [facility];  Service: Cardiopulmonary;  Laterality: N/A;  . ENDOBRONCHIAL ULTRASOUND N/A 11/26/2015   Procedure: ENDOBRONCHIAL ULTRASOUND;  Surgeon: Flora Lipps, MD;  Location: [facility];  Service: Cardiopulmonary;  Laterality: N/A;  . PERIPHERAL VASCULAR CATHETERIZATION N/A 12/09/2015   Procedure: Glori Luis Cath Insertion;  Surgeon: Katha Cabal, MD;  Location: [facility];  Service: Cardiovascular;  Laterality: N/A;    FAMILY HISTORY: Reviewed and unchanged. No reported history of malignancy or chronic disease.     ADVANCED DIRECTIVES:    HEALTH MAINTENANCE: Social History   Tobacco Use  . Smoking status: Current Every Day Smoker    Packs/day: 0.30    Years: 35.00    Pack years: 10.50    Types: Cigarettes  . Smokeless tobacco: Never Used  . Tobacco comment: workiing on quitting, down to 2 packs per week now  Substance Use Topics  . Alcohol use: No    Alcohol/week: 0.0 oz  . Drug use: No     No Known Allergies  Current Outpatient Medications  Medication Sig Dispense Refill  . albuterol (PROVENTIL  HFA;VENTOLIN HFA) 108 (90 Base) MCG/ACT inhaler Inhale 2 puffs into the lungs every 6 (six) hours as needed for wheezing or shortness of breath. 1 Inhaler 2  . atorvastatin (LIPITOR) 20 MG tablet Take 20 mg by mouth daily at 6 PM.    . gabapentin  (NEURONTIN) 300 MG capsule Take 1 capsule (300 mg total) by mouth at bedtime. 30 capsule 2  . glimepiride (AMARYL) 4 MG tablet Take by mouth.    . insulin glargine (LANTUS) 100 UNIT/ML injection Inject 60 Units into the skin at bedtime.    Marland Kitchen levofloxacin (LEVAQUIN) 500 MG tablet Take 1 tablet (500 mg total) by mouth daily. 7 tablet 0  . levothyroxine (SYNTHROID, LEVOTHROID) 150 MCG tablet 200 mcg.     . lidocaine-prilocaine (EMLA) cream Apply to affected area once 30 g 6  . lidocaine-prilocaine (EMLA) cream Apply to affected area once 30 g 3  . linagliptin (TRADJENTA) 5 MG TABS tablet Take 5 mg by mouth daily.     Marland Kitchen lisinopril (PRINIVIL,ZESTRIL) 10 MG tablet Take 10 mg by mouth daily.    . ondansetron (ZOFRAN) 8 MG tablet Take 1 tablet (8 mg total) by mouth 2 (two) times daily as needed for refractory nausea / vomiting. 30 tablet 2  . prochlorperazine (COMPAZINE) 10 MG tablet Take 1 tablet (10 mg total) by mouth every 6 (six) hours as needed (Nausea or vomiting). 60 tablet 2  . magic mouthwash w/lidocaine SOLN Take 5 mLs by mouth 4 (four) times daily. 240 mL 1  . oxyCODONE (OXY IR/ROXICODONE) 5 MG immediate release tablet Take 1 tablet (5 mg total) by mouth every 6 (six) hours as needed for severe pain. 30 tablet 0   Current Facility-Administered Medications  Medication Dose Route Frequency Provider Last Rate Last Dose  . dexamethasone (DECADRON) injection 10 mg  10 mg Intravenous Once [name], MD        OBJECTIVE: Vitals:   01/15/18 1004  BP: 132/83  Pulse: 87  Resp: 18  Temp: (!) 97.1 F (36.2 C)     Body mass index is 41.13 kg/m.    ECOG FS:1 - Symptomatic but completely ambulatory  General: Well-developed, well-nourished, no acute distress. HEENT: Mildly erythematous oropharynx with ulceration on buccal membrane. Eyes: Pink conjunctiva, anicteric sclera. Lungs: Clear to auscultation bilaterally. Heart: Regular rate and rhythm. No rubs, murmurs, or gallops. Abdomen:  Soft, nontender, nondistended. No organomegaly noted, normoactive bowel sounds. Musculoskeletal: No edema, cyanosis, or clubbing. Neuro: Alert, answering all questions appropriately. Cranial nerves grossly intact. Skin: No rashes or petechiae noted. Psych: Normal affect.   LAB RESULTS:  Lab Results  Component Value Date   NA 135 01/15/2018   K 4.7 01/15/2018   CL 108 01/15/2018   CO2 24 01/15/2018   GLUCOSE 186 (H) 01/15/2018   BUN 13 01/15/2018   CREATININE 1.06 (H) 01/15/2018   CALCIUM 8.6 (L) 01/15/2018   PROT 6.9 01/15/2018   ALBUMIN 3.3 (L) 01/15/2018   AST 40 01/15/2018   ALT 38 01/15/2018   ALKPHOS 84 01/15/2018   BILITOT 0.4 01/15/2018   GFRNONAA 53 (L) 01/15/2018   GFRAA >60 01/15/2018    Lab Results  Component Value Date   WBC 1.9 (L) 01/15/2018   NEUTROABS 1.1 (L) 01/15/2018   HGB 9.0 (L) 01/15/2018   HCT 27.2 (L) 01/15/2018   MCV 91.3 01/15/2018   PLT 182 01/15/2018   Lab Results  Component Value Date   IRON 42 07/31/2017   TIBC 280 07/31/2017  IRONPCTSAT 15 07/31/2017    Lab Results  Component Value Date   FERRITIN 81 07/31/2017     STUDIES: No results found.  Oncology treatment history:  Patient completed her initial treatment with concurrent chemotherapy and XRT on March 07, 2016. She then underwent 2 cycles of consolidation carboplatinum and Taxol completing on Apr 27, 2016.  CT scan results from Apr 18, 2017 revealed progression of disease. She subsequently received additional XRT and proceeded with nivolumab every 2 weeks.  She completed a total of 13 infusions of nivolumab on November 06, 2017.  PET scan on November 30, 2017 revealed progression of disease.  Patient started carboplatinum and gemcitabine on December 11, 2017.  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 PET scan results from  November 30, 2017 reviewed independently with new enlarged subcarinal lymph node as well as progression of disease in her left upper lobe.  Nivolumab has been discontinued.  Delay cycle 2, day 1 of carboplatinum and gemcitabine today secondary to decreasing performance status..  Gemcitabine was previously dose reduced 20%.  Return to clinic in 1 week for further evaluation and reconsideration of cycle 2, day 8 gemcitabine only.  Will reimage in March 2019 at the conclusion of cycle 4.  2. Headaches/dizziness: MRI of the brain reviewed independently with no obvious metastatic disease. 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 Okay to proceed from an oncology standpoint. Patient states she has postponed her appointments with neurosurgery. 3. Anemia: Improving. Previously iron stores were within normal limits, monitor.  4. Thrombocytopenia: Proceed with treatment as above and dose reduce gemcitabine. 5. Hyperglycemia: Continue diabetic medications as prescribed.  Appreciate endocrinology input.   6. Pain: Patient does not complain of this today. Continue oxycodone and gabapentin as prescribed.   7. Thyroid: Patient's symptoms have significantly improved since increasing her Synthroid dose. Appreciate endocrinology input who will now be monitoring and adjusting her medications as needed. 8.  Neutropenia: Secondary to chemotherapy.  Delay treatment as above. 9.  Cough/congestion: Patient was instructed to use her inhaler as prescribed.  She was given a prescription for Levaquin and recommended she use OTC cough remedies. 10.  Decreasing performance status: Patient will receive 1 L IV fluids today along with 10 mg dexamethasone and 8 mg Zofran.  Patient expressed understanding and was in agreement with this plan. She also understands that She can call clinic at any time with any questions, concerns, or complaints.    [name], MD   01/16/2018 12:57 PM

## 2018-01-15 ENCOUNTER — Inpatient Hospital Stay: Payer: PPO

## 2018-01-15 ENCOUNTER — Other Ambulatory Visit: Payer: Self-pay | Admitting: *Deleted

## 2018-01-15 ENCOUNTER — Inpatient Hospital Stay (HOSPITAL_BASED_OUTPATIENT_CLINIC_OR_DEPARTMENT_OTHER): Payer: PPO | Admitting: Oncology

## 2018-01-15 VITALS — BP 132/83 | HR 87 | Temp 97.1°F | Resp 18 | Wt 254.8 lb

## 2018-01-15 VITALS — BP 128/86 | HR 73

## 2018-01-15 DIAGNOSIS — D701 Agranulocytosis secondary to cancer chemotherapy: Secondary | ICD-10-CM

## 2018-01-15 DIAGNOSIS — R05 Cough: Secondary | ICD-10-CM

## 2018-01-15 DIAGNOSIS — R42 Dizziness and giddiness: Secondary | ICD-10-CM | POA: Diagnosis not present

## 2018-01-15 DIAGNOSIS — D696 Thrombocytopenia, unspecified: Secondary | ICD-10-CM | POA: Diagnosis not present

## 2018-01-15 DIAGNOSIS — Z5111 Encounter for antineoplastic chemotherapy: Secondary | ICD-10-CM | POA: Diagnosis not present

## 2018-01-15 DIAGNOSIS — C3412 Malignant neoplasm of upper lobe, left bronchus or lung: Secondary | ICD-10-CM | POA: Diagnosis not present

## 2018-01-15 DIAGNOSIS — E860 Dehydration: Secondary | ICD-10-CM

## 2018-01-15 DIAGNOSIS — D649 Anemia, unspecified: Secondary | ICD-10-CM | POA: Diagnosis not present

## 2018-01-15 DIAGNOSIS — E1165 Type 2 diabetes mellitus with hyperglycemia: Secondary | ICD-10-CM | POA: Diagnosis not present

## 2018-01-15 LAB — COMPREHENSIVE METABOLIC PANEL
ALBUMIN: 3.3 g/dL — AB (ref 3.5–5.0)
ALT: 38 U/L (ref 14–54)
AST: 40 U/L (ref 15–41)
Alkaline Phosphatase: 84 U/L (ref 38–126)
Anion gap: 3 — ABNORMAL LOW (ref 5–15)
BILIRUBIN TOTAL: 0.4 mg/dL (ref 0.3–1.2)
BUN: 13 mg/dL (ref 6–20)
CHLORIDE: 108 mmol/L (ref 101–111)
CO2: 24 mmol/L (ref 22–32)
Calcium: 8.6 mg/dL — ABNORMAL LOW (ref 8.9–10.3)
Creatinine, Ser: 1.06 mg/dL — ABNORMAL HIGH (ref 0.44–1.00)
GFR calc Af Amer: >60 mL/min (ref >60)
GFR calc non Af Amer: 53 mL/min — ABNORMAL LOW (ref >60)
GLUCOSE: 186 mg/dL — AB (ref 65–99)
POTASSIUM: 4.7 mmol/L (ref 3.5–5.1)
Sodium: 135 mmol/L (ref 135–145)
TOTAL PROTEIN: 6.9 g/dL (ref 6.5–8.1)

## 2018-01-15 LAB — CBC WITH DIFFERENTIAL/PLATELET
Basophils Absolute: 0 10*3/uL (ref 0–0.1)
Basophils Relative: 1 %
EOS PCT: 1 %
Eosinophils Absolute: 0 10*3/uL (ref 0–0.7)
HCT: 27.2 % — ABNORMAL LOW (ref 35.0–47.0)
Hemoglobin: 9 g/dL — ABNORMAL LOW (ref 12.0–16.0)
LYMPHS ABS: 0.8 10*3/uL — AB (ref 1.0–3.6)
LYMPHS PCT: 39 %
MCH: 30.3 pg (ref 26.0–34.0)
MCHC: 33.2 g/dL (ref 32.0–36.0)
MCV: 91.3 fL (ref 80.0–100.0)
MONO ABS: 0.1 10*3/uL — AB (ref 0.2–0.9)
MONOS PCT: 4 %
Neutro Abs: 1.1 10*3/uL — ABNORMAL LOW (ref 1.4–6.5)
Neutrophils Relative %: 55 %
PLATELETS: 182 10*3/uL (ref 150–440)
RBC: 2.98 MIL/uL — ABNORMAL LOW (ref 3.80–5.20)
RDW: 14.1 % (ref 11.5–14.5)
WBC: 1.9 10*3/uL — ABNORMAL LOW (ref 3.6–11.0)

## 2018-01-15 MED ORDER — OXYCODONE HCL 5 MG PO TABS: 5.0000 mg | ORAL_TABLET | Freq: Four times a day (QID) | ORAL | 0 refills | Status: DC | PRN

## 2018-01-15 MED ORDER — ONDANSETRON HCL 4 MG/2ML IJ SOLN
8.0000 mg | Freq: Once | INTRAMUSCULAR | Status: AC
  Administered 2018-01-15: 8 mg via INTRAVENOUS
  Filled 2018-01-15: qty 4

## 2018-01-15 MED ORDER — DEXAMETHASONE SODIUM PHOSPHATE 10 MG/ML IJ SOLN
10.0000 mg | Freq: Once | INTRAMUSCULAR | Status: AC
  Administered 2018-01-15: 10 mg via INTRAVENOUS

## 2018-01-15 MED ORDER — HEPARIN SOD (PORK) LOCK FLUSH 100 UNIT/ML IV SOLN
500.0000 [IU] | Freq: Once | INTRAVENOUS | Status: AC
  Administered 2018-01-15: 500 [IU] via INTRAVENOUS

## 2018-01-15 MED ORDER — SODIUM CHLORIDE 0.9 % IV SOLN: Freq: Once | INTRAVENOUS | Status: DC

## 2018-01-15 MED ORDER — DEXAMETHASONE SODIUM PHOSPHATE 10 MG/ML IJ SOLN
10.0000 mg | Freq: Once | INTRAMUSCULAR | Status: DC
  Filled 2018-01-15: qty 1

## 2018-01-15 MED ORDER — MAGIC MOUTHWASH W/LIDOCAINE: 5.0000 mL | Freq: Four times a day (QID) | ORAL | 1 refills | Status: AC

## 2018-01-15 MED ORDER — SODIUM CHLORIDE 0.9 % IV SOLN
Freq: Once | INTRAVENOUS | Status: AC
  Administered 2018-01-15: 11:00:00 via INTRAVENOUS
  Filled 2018-01-15: qty 1000

## 2018-01-15 NOTE — Progress Notes (Signed)
Patient here today for follow up and treatment consideration regarding lung cancer. Patient reports weakness, mouth sores.

## 2018-01-21 NOTE — Progress Notes (Signed)
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  INTERVAL HISTORY: Patient returns to clinic today for further evaluation and reconsideration of treatment with carboplatinum and gemcitabine.  She feels significantly improved over last week and now is back to her baseline. She continues to have persistent dizziness. Her peripheral neuropathy is unchanged. She denies any recent fevers or illnesses. She denies any chest pain or shortness of breath. She denies nausea, constipation, diarrhea, or vomiting.  She has no urinary complaints. Patient offers no further specific complaints today.  REVIEW OF SYSTEMS:   Review of Systems  Constitutional: Negative.  Negative for fever, malaise/fatigue and weight loss.  HENT: Negative.  Negative for congestion.   Respiratory: Negative.  Negative for cough, hemoptysis and shortness of breath.   Cardiovascular: Negative.  Negative for chest pain and leg swelling.  Gastrointestinal: Negative.  Negative for abdominal pain.  Genitourinary: Negative.  Negative for flank pain.  Musculoskeletal: Negative.  Negative for joint pain and myalgias.  Skin: Negative.  Negative for rash.  Neurological: Positive for dizziness and sensory change. Negative for weakness and headaches.  Psychiatric/Behavioral: Negative.  Negative for memory loss. The patient is not nervous/anxious.     As per HPI. Otherwise, a complete review of systems is negative.  PAST MEDICAL HISTORY: Past Medical History:    Diagnosis Date  . Arthritis   . Blood dyscrasia   . Cancer of left lung ([place]) 08/2015   Rad + chemo tx's.  . Diabetes mellitus without complication ([place])   . Diabetic neuropathy ([place])   . High cholesterol   . Hypertension   . Insulin dependent diabetes mellitus ([place])     PAST SURGICAL HISTORY: Past Surgical History:  Procedure Laterality Date  . ABDOMINAL HYSTERECTOMY    . CESAREAN SECTION    . ELECTROMAGNETIC NAVIGATION BROCHOSCOPY N/A 11/26/2015   Procedure: ELECTROMAGNETIC NAVIGATION BRONCHOSCOPY;  Surgeon: Flora Lipps, MD;  Location: [facility];  Service: Cardiopulmonary;  Laterality: N/A;  . ENDOBRONCHIAL ULTRASOUND N/A 11/26/2015   Procedure: ENDOBRONCHIAL ULTRASOUND;  Surgeon: Flora Lipps, MD;  Location: [facility];  Service: Cardiopulmonary;  Laterality: N/A;  . PERIPHERAL VASCULAR CATHETERIZATION N/A 12/09/2015   Procedure: Glori Luis Cath Insertion;  Surgeon: Katha Cabal, MD;  Location: [place] [facility];  Service: Cardiovascular;  Laterality: N/A;    FAMILY HISTORY: Reviewed and unchanged. No reported history of malignancy or chronic disease.     ADVANCED DIRECTIVES:    HEALTH MAINTENANCE: Social History   Tobacco Use  . Smoking status: Current Every Day Smoker    Packs/day: 0.30    Years: 35.00    Pack years: 10.50    Types: Cigarettes  . Smokeless tobacco: Never Used  . Tobacco comment: workiing on quitting, down to 2 packs per week now  Substance Use Topics  . Alcohol use: No    Alcohol/week: 0.0 oz  . Drug use: No     No Known Allergies  Current Outpatient Medications  Medication Sig Dispense Refill  . albuterol (PROVENTIL HFA;VENTOLIN HFA) 108 (90 Base) MCG/ACT inhaler Inhale 2 puffs into the lungs  every 6 (six) hours as needed for wheezing or shortness of breath. 1 Inhaler 2  . atorvastatin (LIPITOR) 20 MG tablet Take 20 mg by mouth daily at 6 PM.    . gabapentin (NEURONTIN) 300 MG capsule Take 1 capsule (300 mg total) by mouth at bedtime. 30  capsule 2  . glimepiride (AMARYL) 4 MG tablet Take by mouth.    . insulin glargine (LANTUS) 100 UNIT/ML injection Inject 60 Units into the skin at bedtime.    Marland Kitchen levothyroxine (SYNTHROID, LEVOTHROID) 150 MCG tablet 200 mcg.     . lidocaine-prilocaine (EMLA) cream Apply to affected area once 30 g 6  . linagliptin (TRADJENTA) 5 MG TABS tablet Take 5 mg by mouth daily.     Marland Kitchen lisinopril (PRINIVIL,ZESTRIL) 10 MG tablet Take 10 mg by mouth daily.    . magic mouthwash w/lidocaine SOLN Take 5 mLs by mouth 4 (four) times daily. 240 mL 1  . ondansetron (ZOFRAN) 8 MG tablet Take 1 tablet (8 mg total) by mouth 2 (two) times daily as needed for refractory nausea / vomiting. 30 tablet 2  . oxyCODONE (OXY IR/ROXICODONE) 5 MG immediate release tablet Take 1 tablet (5 mg total) by mouth every 6 (six) hours as needed for severe pain. 30 tablet 0  . prochlorperazine (COMPAZINE) 10 MG tablet Take 1 tablet (10 mg total) by mouth every 6 (six) hours as needed (Nausea or vomiting). 60 tablet 2   Current Facility-Administered Medications  Medication Dose Route Frequency Provider Last Rate Last Dose  . dexamethasone (DECADRON) injection 10 mg  10 mg Intravenous Once [name], MD       Facility-Administered Medications Ordered in Other Visits  Medication Dose Route Frequency Provider Last Rate Last Dose  . sodium chloride flush (NS) 0.9 % injection 10 mL  10 mL Intravenous PRN [name], MD   10 mL at 01/22/18 0843    OBJECTIVE: Vitals:   01/22/18 0901  BP: 113/75  Pulse: 93  Resp: 18  Temp: (!) 97.5 F (36.4 C)     Body mass index is 42.18 kg/m.    ECOG FS:1 - Symptomatic but completely ambulatory  General: Well-developed, well-nourished, no acute distress. HEENT: Clear oropharynx without ulcerations.   Eyes: Pink conjunctiva, anicteric sclera. Lungs: Clear to auscultation bilaterally. Heart: Regular rate and rhythm. No rubs, murmurs, or gallops. Abdomen: Soft, nontender,  nondistended. No organomegaly noted, normoactive bowel sounds. Musculoskeletal: No edema, cyanosis, or clubbing. Neuro: Alert, answering all questions appropriately. Cranial nerves grossly intact. Skin: No rashes or petechiae noted. Psych: Normal affect.   LAB RESULTS:  Lab Results  Component Value Date   NA 135 01/22/2018   K 4.2 01/22/2018   CL 101 01/22/2018   CO2 24 01/22/2018   GLUCOSE 316 (H) 01/22/2018   BUN 13 01/22/2018   CREATININE 0.99 01/22/2018   CALCIUM 8.3 (L) 01/22/2018   PROT 6.5 01/22/2018   ALBUMIN 3.1 (L) 01/22/2018   AST 27 01/22/2018   ALT 20 01/22/2018   ALKPHOS 77 01/22/2018   BILITOT 0.5 01/22/2018   GFRNONAA 57 (L) 01/22/2018   GFRAA >60 01/22/2018    Lab Results  Component Value Date   WBC 2.9 (L) 01/22/2018   NEUTROABS 1.8 01/22/2018   HGB 8.3 (L) 01/22/2018   HCT 24.3 (L) 01/22/2018   MCV 91.3 01/22/2018   PLT 39 (L) 01/22/2018   Lab Results  Component Value Date   IRON 42 07/31/2017   TIBC 280 07/31/2017   IRONPCTSAT  15 07/31/2017    Lab Results  Component Value Date   FERRITIN 81 07/31/2017     STUDIES: No results found.  Oncology treatment history:  Patient completed her initial treatment with concurrent chemotherapy and XRT on March 07, 2016. She then underwent 2 cycles of consolidation carboplatinum and Taxol completing on Apr 27, 2016.  CT scan results from Apr 18, 2017 revealed progression of disease. She subsequently received additional XRT and proceeded with nivolumab every 2 weeks.  She completed a total of 13 infusions of nivolumab on November 06, 2017.  PET scan on November 30, 2017 revealed progression of disease.  Patient started carboplatinum and gemcitabine on December 11, 2017.  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 PET scan results from November 30, 2017  reviewed independently with new enlarged subcarinal lymph node as well as progression of disease in her left upper lobe.  Nivolumab has been discontinued.  Delay cycle 2, day 1 of carboplatinum and gemcitabine today again secondary to thrombocytopenia. Gemcitabine was previously dose reduced 20%.  Return to clinic in 1 week for further evaluation and reconsideration of treatment, but will consider this treatment cycle 3, day 1.  Will reimage in March 2019 at the conclusion of cycle 4.   2. Headaches/dizziness: MRI of the brain reviewed independently with no obvious metastatic disease. 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 Okay to proceed from an oncology standpoint. Patient was given a referral back to neurosurgery for reevaluation.   3. Anemia: Improving. Previously iron stores were within normal limits, monitor.  4. Thrombocytopenia: Delay treatment as above and dose reduce gemcitabine. 5. Hyperglycemia: Continue diabetic medications as prescribed.  Appreciate endocrinology input.   6. Pain: Patient does not complain of this today. Continue oxycodone and gabapentin as prescribed.   7. Thyroid: Patient's symptoms have significantly improved since increasing her Synthroid dose. Appreciate endocrinology input who will now be monitoring and adjusting her medications as needed. 8.  Neutropenia: Improved.  Secondary to chemotherapy.  Delay treatment as above. 9.  Cough/congestion: Resolved.  Patient expressed understanding and was in agreement with this plan. She also understands that She can call clinic at any time with any questions, concerns, or complaints.    [name], MD   01/22/2018 9:42 AM

## 2018-01-22 ENCOUNTER — Ambulatory Visit: Payer: PPO | Admitting: Oncology

## 2018-01-22 ENCOUNTER — Inpatient Hospital Stay: Payer: PPO

## 2018-01-22 ENCOUNTER — Ambulatory Visit: Payer: PPO

## 2018-01-22 ENCOUNTER — Inpatient Hospital Stay (HOSPITAL_BASED_OUTPATIENT_CLINIC_OR_DEPARTMENT_OTHER): Payer: PPO | Admitting: Oncology

## 2018-01-22 ENCOUNTER — Other Ambulatory Visit: Payer: PPO

## 2018-01-22 VITALS — BP 113/75 | HR 93 | Temp 97.5°F | Resp 18 | Wt 261.3 lb

## 2018-01-22 DIAGNOSIS — C3412 Malignant neoplasm of upper lobe, left bronchus or lung: Secondary | ICD-10-CM

## 2018-01-22 DIAGNOSIS — R42 Dizziness and giddiness: Secondary | ICD-10-CM | POA: Diagnosis not present

## 2018-01-22 DIAGNOSIS — Z5111 Encounter for antineoplastic chemotherapy: Secondary | ICD-10-CM | POA: Diagnosis not present

## 2018-01-22 DIAGNOSIS — D696 Thrombocytopenia, unspecified: Secondary | ICD-10-CM

## 2018-01-22 DIAGNOSIS — E1165 Type 2 diabetes mellitus with hyperglycemia: Secondary | ICD-10-CM

## 2018-01-22 DIAGNOSIS — D649 Anemia, unspecified: Secondary | ICD-10-CM | POA: Diagnosis not present

## 2018-01-22 DIAGNOSIS — R05 Cough: Secondary | ICD-10-CM | POA: Diagnosis not present

## 2018-01-22 DIAGNOSIS — D701 Agranulocytosis secondary to cancer chemotherapy: Secondary | ICD-10-CM

## 2018-01-22 LAB — CBC WITH DIFFERENTIAL/PLATELET
BASOS PCT: 0 %
Basophils Absolute: 0 10*3/uL (ref 0–0.1)
EOS PCT: 1 %
Eosinophils Absolute: 0 10*3/uL (ref 0–0.7)
HCT: 24.3 % — ABNORMAL LOW (ref 35.0–47.0)
Hemoglobin: 8.3 g/dL — ABNORMAL LOW (ref 12.0–16.0)
Lymphocytes Relative: 23 %
Lymphs Abs: 0.7 10*3/uL — ABNORMAL LOW (ref 1.0–3.6)
MCH: 31.4 pg (ref 26.0–34.0)
MCHC: 34.3 g/dL (ref 32.0–36.0)
MCV: 91.3 fL (ref 80.0–100.0)
MONO ABS: 0.4 10*3/uL (ref 0.2–0.9)
MONOS PCT: 14 %
Neutro Abs: 1.8 10*3/uL (ref 1.4–6.5)
Neutrophils Relative %: 62 %
PLATELETS: 39 10*3/uL — AB (ref 150–440)
RBC: 2.66 MIL/uL — ABNORMAL LOW (ref 3.80–5.20)
RDW: 14.5 % (ref 11.5–14.5)
WBC: 2.9 10*3/uL — ABNORMAL LOW (ref 3.6–11.0)

## 2018-01-22 LAB — COMPREHENSIVE METABOLIC PANEL
ALBUMIN: 3.1 g/dL — AB (ref 3.5–5.0)
ALK PHOS: 77 U/L (ref 38–126)
ALT: 20 U/L (ref 14–54)
AST: 27 U/L (ref 15–41)
Anion gap: 10 (ref 5–15)
BILIRUBIN TOTAL: 0.5 mg/dL (ref 0.3–1.2)
BUN: 13 mg/dL (ref 6–20)
CALCIUM: 8.3 mg/dL — AB (ref 8.9–10.3)
CO2: 24 mmol/L (ref 22–32)
CREATININE: 0.99 mg/dL (ref 0.44–1.00)
Chloride: 101 mmol/L (ref 101–111)
GFR calc Af Amer: >60 mL/min (ref >60)
GFR calc non Af Amer: 57 mL/min — ABNORMAL LOW (ref >60)
GLUCOSE: 316 mg/dL — AB (ref 65–99)
Potassium: 4.2 mmol/L (ref 3.5–5.1)
SODIUM: 135 mmol/L (ref 135–145)
Total Protein: 6.5 g/dL (ref 6.5–8.1)

## 2018-01-22 MED ORDER — SODIUM CHLORIDE 0.9% FLUSH
10.0000 mL | INTRAVENOUS | Status: DC | PRN
  Administered 2018-01-22: 10 mL via INTRAVENOUS
  Filled 2018-01-22: qty 10

## 2018-01-22 MED ORDER — HEPARIN SOD (PORK) LOCK FLUSH 100 UNIT/ML IV SOLN
500.0000 [IU] | Freq: Once | INTRAVENOUS | Status: AC
  Administered 2018-01-22: 500 [IU] via INTRAVENOUS
  Filled 2018-01-22: qty 5

## 2018-01-22 NOTE — Progress Notes (Signed)
Pt in for 1 week follow up.  Much improved from last week.  Reports able to eat soft foods and fluids. Magic mouthwash helped "a lot".  States having some nausea but reports zofran helps.

## 2018-01-25 ENCOUNTER — Other Ambulatory Visit: Payer: Self-pay | Admitting: Oncology

## 2018-01-28 NOTE — Progress Notes (Signed)
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  INTERVAL HISTORY: Patient returns to clinic today for further evaluation and reconsideration of treatment with carboplatinum and gemcitabine which would be cycle 3, day 1.  She currently feels well. She continues to have persistent dizziness. Her peripheral neuropathy is unchanged. She denies any recent fevers or illnesses. She denies any chest pain or shortness of breath. She denies nausea, constipation, diarrhea, or vomiting.  She has no urinary complaints. Patient offers no further specific complaints today.  REVIEW OF SYSTEMS:   Review of Systems  Constitutional: Negative.  Negative for fever, malaise/fatigue and weight loss.  HENT: Negative.  Negative for congestion.   Respiratory: Negative.  Negative for cough, hemoptysis and shortness of breath.   Cardiovascular: Negative.  Negative for chest pain and leg swelling.  Gastrointestinal: Negative.  Negative for abdominal pain.  Genitourinary: Negative.  Negative for flank pain.  Musculoskeletal: Negative.  Negative for joint pain and myalgias.  Skin: Negative.  Negative for rash.  Neurological: Positive for dizziness and sensory change. Negative for weakness and headaches.  Psychiatric/Behavioral: Negative.  Negative for memory loss. The patient is not nervous/anxious.     As per HPI. Otherwise, a complete review of systems is negative.  PAST MEDICAL HISTORY: Past Medical History:  Diagnosis Date  .  Arthritis   . Blood dyscrasia   . Cancer of left lung ([place]) 08/2015   Rad + chemo tx's.  . Diabetes mellitus without complication ([place])   . Diabetic neuropathy ([place])   . High cholesterol   . Hypertension   . Insulin dependent diabetes mellitus ([place])     PAST SURGICAL HISTORY: Past Surgical History:  Procedure Laterality Date  . ABDOMINAL HYSTERECTOMY    . CESAREAN SECTION    . ELECTROMAGNETIC NAVIGATION BROCHOSCOPY N/A 11/26/2015   Procedure: ELECTROMAGNETIC NAVIGATION BRONCHOSCOPY;  Surgeon: Flora Lipps, MD;  Location: [facility];  Service: Cardiopulmonary;  Laterality: N/A;  . ENDOBRONCHIAL ULTRASOUND N/A 11/26/2015   Procedure: ENDOBRONCHIAL ULTRASOUND;  Surgeon: Flora Lipps, MD;  Location: [facility];  Service: Cardiopulmonary;  Laterality: N/A;  . PERIPHERAL VASCULAR CATHETERIZATION N/A 12/09/2015   Procedure: Glori Luis Cath Insertion;  Surgeon: Katha Cabal, MD;  Location: [place] [facility];  Service: Cardiovascular;  Laterality: N/A;    FAMILY HISTORY: Reviewed and unchanged. No reported history of malignancy or chronic disease.     ADVANCED DIRECTIVES:    HEALTH MAINTENANCE: Social History   Tobacco Use  . Smoking status: Current Every Day Smoker    Packs/day: 0.30    Years: 35.00    Pack years: 10.50    Types: Cigarettes  . Smokeless tobacco: Never Used  . Tobacco comment: workiing on quitting, down to 2 packs per week now  Substance Use Topics  . Alcohol use: No    Alcohol/week: 0.0 oz  . Drug use: No     No Known Allergies  Current Outpatient Medications  Medication Sig Dispense Refill  . gabapentin (NEURONTIN) 300 MG capsule Take 1 capsule (300 mg total) by mouth at bedtime. 30 capsule 2  .  glimepiride (AMARYL) 4 MG tablet Take 4 mg by mouth daily with breakfast.     . insulin glargine (LANTUS) 100 UNIT/ML injection Inject 60 Units into the skin daily.     Marland Kitchen levothyroxine (SYNTHROID, LEVOTHROID) 150 MCG tablet Take 200 mcg by mouth daily before  breakfast.     . lidocaine-prilocaine (EMLA) cream Apply to affected area once 30 g 6  . ondansetron (ZOFRAN) 8 MG tablet Take 1 tablet (8 mg total) by mouth 2 (two) times daily as needed for refractory nausea / vomiting. 30 tablet 2  . oxyCODONE (OXY IR/ROXICODONE) 5 MG immediate release tablet Take 1 tablet (5 mg total) by mouth every 6 (six) hours as needed for severe pain. 30 tablet 0  . albuterol (PROVENTIL HFA;VENTOLIN HFA) 108 (90 Base) MCG/ACT inhaler Inhale 2 puffs into the lungs every 6 (six) hours as needed for wheezing or shortness of breath. (Patient not taking: Reported on 01/29/2018) 1 Inhaler 2  . atorvastatin (LIPITOR) 20 MG tablet Take 20 mg by mouth daily at 6 PM.    . linagliptin (TRADJENTA) 5 MG TABS tablet Take 5 mg by mouth daily.     Marland Kitchen lisinopril (PRINIVIL,ZESTRIL) 10 MG tablet Take 10 mg by mouth daily.    . magic mouthwash w/lidocaine SOLN Take 5 mLs by mouth 4 (four) times daily. (Patient not taking: Reported on 01/29/2018) 240 mL 1  . prochlorperazine (COMPAZINE) 10 MG tablet Take 1 tablet (10 mg total) by mouth every 6 (six) hours as needed (Nausea or vomiting). (Patient not taking: Reported on 01/29/2018) 60 tablet 2   Current Facility-Administered Medications  Medication Dose Route Frequency Provider Last Rate Last Dose  . dexamethasone (DECADRON) injection 10 mg  10 mg Intravenous Once [name], MD       Facility-Administered Medications Ordered in Other Visits  Medication Dose Route Frequency Provider Last Rate Last Dose  . CARBOplatin (PARAPLATIN) 470 mg in sodium chloride 0.9 % 250 mL chemo infusion  470 mg Intravenous Once [name], MD      . gemcitabine (GEMZAR) 1,800 mg in sodium chloride 0.9 % 250 mL chemo infusion  1,800 mg Intravenous Once [name], MD      . heparin lock flush 100 unit/mL  500 Units Intravenous Once [name], MD        OBJECTIVE: Vitals:   01/29/18 0848  BP: 103/66  Pulse: 90  Resp: 18    Temp: (!) 95.8 F (35.4 C)     Body mass index is 42.17 kg/m.    ECOG FS:1 - Symptomatic but completely ambulatory  General: Well-developed, well-nourished, no acute distress. HEENT: Clear oropharynx without ulcerations.   Eyes: Pink conjunctiva, anicteric sclera. Lungs: Clear to auscultation bilaterally. Heart: Regular rate and rhythm. No rubs, murmurs, or gallops. Abdomen: Soft, nontender, nondistended. No organomegaly noted, normoactive bowel sounds. Musculoskeletal: No edema, cyanosis, or clubbing. Neuro: Alert, answering all questions appropriately. Cranial nerves grossly intact. Skin: No rashes or petechiae noted. Psych: Normal affect.   LAB RESULTS:  Lab Results  Component Value Date   NA 138 01/29/2018   K 4.0 01/29/2018   CL 106 01/29/2018   CO2 24 01/29/2018   GLUCOSE 299 (H) 01/29/2018   BUN 13 01/29/2018   CREATININE 1.09 (H) 01/29/2018   CALCIUM 8.3 (L) 01/29/2018   PROT 6.6 01/29/2018   ALBUMIN 3.1 (L) 01/29/2018   AST 24 01/29/2018   ALT 15 01/29/2018   ALKPHOS 87 01/29/2018   BILITOT 0.4  01/29/2018   GFRNONAA 51 (L) 01/29/2018   GFRAA 59 (L) 01/29/2018    Lab Results  Component Value Date   WBC 3.5 (L) 01/29/2018   NEUTROABS 2.3 01/29/2018   HGB 8.5 (L) 01/29/2018   HCT 24.7 (L) 01/29/2018   MCV 92.3 01/29/2018   PLT 182 01/29/2018   Lab Results  Component Value Date   IRON 42 07/31/2017   TIBC 280 07/31/2017   IRONPCTSAT 15 07/31/2017    Lab Results  Component Value Date   FERRITIN 81 07/31/2017     STUDIES: No results found.  Oncology treatment history:  Patient completed her initial treatment with concurrent chemotherapy and XRT on March 07, 2016. She then underwent 2 cycles of consolidation carboplatinum and Taxol completing on Apr 27, 2016.  CT scan results from Apr 18, 2017 revealed progression of disease. She subsequently received additional XRT and proceeded with nivolumab every 2 weeks.  She completed a total of 13 infusions of  nivolumab on November 06, 2017.  PET scan on November 30, 2017 revealed progression of disease.  Patient started carboplatinum and gemcitabine on December 11, 2017.  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 PET scan results from November 30, 2017 reviewed independently with new enlarged subcarinal lymph node as well as progression of disease in her left upper lobe.  Nivolumab has been discontinued.  Proceed with cycle 3, day 1 of carboplatinum and gemcitabine today.  Cycle 2, day 8 was skipped secondary to persistent thrombocytopenia. Gemcitabine was previously dose reduced 20%, will dose reduce carboplatin to AUC 4.  Return to clinic in 1 week for further evaluation and consideration of cycle 3, day 8. Will reimage in March 2019 at the conclusion of cycle 4.   2. Headaches/dizziness: MRI of the brain reviewed independently with no obvious metastatic disease. 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 Okay to proceed from an oncology standpoint. Patient has an appointment with neurosurgery tomorrow. 3. Anemia: Decreased but stable. Previously iron stores were within normal limits, monitor.  4. Thrombocytopenia: Resolved proceed with dose reduce carboplatinum and gemcitabine as above.   5. Hyperglycemia: Continue diabetic medications as prescribed.  Appreciate endocrinology input.   6. Pain: Patient does not complain of this today.  Patient was given a refill of oxycodone today. 7. Thyroid: Patient's symptoms have significantly improved since increasing her Synthroid dose. Appreciate endocrinology input who will now be monitoring and adjusting her medications as needed. 8.  Neutropenia: Improved.  Secondary to chemotherapy. 9.  Cough/congestion: Resolved.  Patient expressed understanding and was in agreement with this  plan. She also understands that She can call clinic at any time with any questions, concerns, or complaints.    [name], MD   01/29/2018 9:41 AM

## 2018-01-29 ENCOUNTER — Inpatient Hospital Stay (HOSPITAL_BASED_OUTPATIENT_CLINIC_OR_DEPARTMENT_OTHER): Payer: PPO | Admitting: Oncology

## 2018-01-29 ENCOUNTER — Inpatient Hospital Stay: Payer: PPO

## 2018-01-29 ENCOUNTER — Other Ambulatory Visit: Payer: Self-pay

## 2018-01-29 ENCOUNTER — Other Ambulatory Visit: Payer: Self-pay | Admitting: *Deleted

## 2018-01-29 VITALS — BP 103/66 | HR 90 | Temp 95.8°F | Resp 18 | Wt 261.3 lb

## 2018-01-29 DIAGNOSIS — C3412 Malignant neoplasm of upper lobe, left bronchus or lung: Secondary | ICD-10-CM | POA: Diagnosis not present

## 2018-01-29 DIAGNOSIS — D649 Anemia, unspecified: Secondary | ICD-10-CM

## 2018-01-29 DIAGNOSIS — R42 Dizziness and giddiness: Secondary | ICD-10-CM

## 2018-01-29 DIAGNOSIS — E1165 Type 2 diabetes mellitus with hyperglycemia: Secondary | ICD-10-CM | POA: Diagnosis not present

## 2018-01-29 DIAGNOSIS — Z5111 Encounter for antineoplastic chemotherapy: Secondary | ICD-10-CM | POA: Diagnosis not present

## 2018-01-29 LAB — COMPREHENSIVE METABOLIC PANEL
ALT: 15 U/L (ref 14–54)
AST: 24 U/L (ref 15–41)
Albumin: 3.1 g/dL — ABNORMAL LOW (ref 3.5–5.0)
Alkaline Phosphatase: 87 U/L (ref 38–126)
Anion gap: 8 (ref 5–15)
BUN: 13 mg/dL (ref 6–20)
CHLORIDE: 106 mmol/L (ref 101–111)
CO2: 24 mmol/L (ref 22–32)
Calcium: 8.3 mg/dL — ABNORMAL LOW (ref 8.9–10.3)
Creatinine, Ser: 1.09 mg/dL — ABNORMAL HIGH (ref 0.44–1.00)
GFR calc Af Amer: 59 mL/min — ABNORMAL LOW (ref >60)
GFR, EST NON AFRICAN AMERICAN: 51 mL/min — AB (ref >60)
Glucose, Bld: 299 mg/dL — ABNORMAL HIGH (ref 65–99)
Potassium: 4 mmol/L (ref 3.5–5.1)
Sodium: 138 mmol/L (ref 135–145)
Total Bilirubin: 0.4 mg/dL (ref 0.3–1.2)
Total Protein: 6.6 g/dL (ref 6.5–8.1)

## 2018-01-29 LAB — CBC WITH DIFFERENTIAL/PLATELET
Basophils Absolute: 0 10*3/uL (ref 0–0.1)
Basophils Relative: 1 %
EOS PCT: 1 %
Eosinophils Absolute: 0 10*3/uL (ref 0–0.7)
HCT: 24.7 % — ABNORMAL LOW (ref 35.0–47.0)
Hemoglobin: 8.5 g/dL — ABNORMAL LOW (ref 12.0–16.0)
LYMPHS ABS: 0.7 10*3/uL — AB (ref 1.0–3.6)
Lymphocytes Relative: 20 %
MCH: 31.6 pg (ref 26.0–34.0)
MCHC: 34.2 g/dL (ref 32.0–36.0)
MCV: 92.3 fL (ref 80.0–100.0)
MONOS PCT: 13 %
Monocytes Absolute: 0.4 10*3/uL (ref 0.2–0.9)
Neutro Abs: 2.3 10*3/uL (ref 1.4–6.5)
Neutrophils Relative %: 65 %
PLATELETS: 182 10*3/uL (ref 150–440)
RBC: 2.68 MIL/uL — AB (ref 3.80–5.20)
RDW: 15.5 % — ABNORMAL HIGH (ref 11.5–14.5)
WBC: 3.5 10*3/uL — AB (ref 3.6–11.0)

## 2018-01-29 MED ORDER — DEXAMETHASONE SODIUM PHOSPHATE 10 MG/ML IJ SOLN
10.0000 mg | Freq: Once | INTRAMUSCULAR | Status: AC
Start: 2018-01-29 — End: 2018-01-29
  Administered 2018-01-29: 10 mg via INTRAVENOUS
  Filled 2018-01-29: qty 1

## 2018-01-29 MED ORDER — SODIUM CHLORIDE 0.9 % IV SOLN
1800.0000 mg | Freq: Once | INTRAVENOUS | Status: AC
  Administered 2018-01-29: 1800 mg via INTRAVENOUS
  Filled 2018-01-29: qty 26.3

## 2018-01-29 MED ORDER — PALONOSETRON HCL INJECTION 0.25 MG/5ML
0.2500 mg | Freq: Once | INTRAVENOUS | Status: AC
  Administered 2018-01-29: 0.25 mg via INTRAVENOUS
  Filled 2018-01-29: qty 5

## 2018-01-29 MED ORDER — SODIUM CHLORIDE 0.9% FLUSH
10.0000 mL | Freq: Once | INTRAVENOUS | Status: AC
  Administered 2018-01-29: 10 mL via INTRAVENOUS
  Filled 2018-01-29: qty 10

## 2018-01-29 MED ORDER — SODIUM CHLORIDE 0.9 % IV SOLN
468.0000 mg | Freq: Once | INTRAVENOUS | Status: AC
  Administered 2018-01-29: 470 mg via INTRAVENOUS
  Filled 2018-01-29: qty 47

## 2018-01-29 MED ORDER — SODIUM CHLORIDE 0.9 % IV SOLN: 630.0000 mg | Freq: Once | INTRAVENOUS | Status: DC

## 2018-01-29 MED ORDER — SODIUM CHLORIDE 0.9 % IV SOLN
Freq: Once | INTRAVENOUS | Status: AC
  Administered 2018-01-29: 10:00:00 via INTRAVENOUS
  Filled 2018-01-29: qty 1000

## 2018-01-29 MED ORDER — SODIUM CHLORIDE 0.9 % IV SOLN: 514.0000 mg | Freq: Once | INTRAVENOUS | Status: DC

## 2018-01-29 MED ORDER — HEPARIN SOD (PORK) LOCK FLUSH 100 UNIT/ML IV SOLN
500.0000 [IU] | Freq: Once | INTRAVENOUS | Status: AC
  Administered 2018-01-29: 500 [IU] via INTRAVENOUS
  Filled 2018-01-29: qty 5

## 2018-01-29 NOTE — Progress Notes (Signed)
Here for follow up. C/o intermittent nausea -zofran helps. Needs refill of oxycodone 5 mg immed release per pt

## 2018-01-29 NOTE — Progress Notes (Signed)
Carboplatin dose was reduced from goal auc of 5 to goal auc of 4 per Dr Woodfin Ganja. Calculated dose is now 470mg 

## 2018-01-30 ENCOUNTER — Other Ambulatory Visit: Payer: Self-pay | Admitting: *Deleted

## 2018-01-30 DIAGNOSIS — G935 Compression of brain: Secondary | ICD-10-CM | POA: Diagnosis not present

## 2018-01-30 MED ORDER — OXYCODONE HCL 5 MG PO TABS
5.0000 mg | ORAL_TABLET | Freq: Four times a day (QID) | ORAL | 0 refills | Status: DC | PRN
Start: 2018-01-30 — End: 2018-02-26

## 2018-02-03 NOTE — Progress Notes (Signed)
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  INTERVAL HISTORY: Patient returns to clinic today for further evaluation and consideration of cycle 3, day 8 of carboplatinum and gemcitabine.  Gemcitabine only today. She currently feels well. She continues to have persistent dizziness. Her peripheral neuropathy is unchanged. She denies any recent fevers or illnesses. She denies any chest pain or shortness of breath. She denies nausea, constipation, diarrhea, or vomiting.  She has no urinary complaints. Patient offers no further specific complaints today.  REVIEW OF SYSTEMS:   Review of Systems  Constitutional: Negative.  Negative for fever, malaise/fatigue and weight loss.  HENT: Negative.  Negative for congestion.   Respiratory: Negative.  Negative for cough, hemoptysis and shortness of breath.   Cardiovascular: Negative.  Negative for chest pain and leg swelling.  Gastrointestinal: Negative.  Negative for abdominal pain.  Genitourinary: Negative.  Negative for flank pain.  Musculoskeletal: Negative.  Negative for joint pain and myalgias.  Skin: Negative.  Negative for rash.  Neurological: Positive for dizziness and sensory change. Negative for weakness and headaches.  Psychiatric/Behavioral: Negative.  Negative for memory loss. The patient is not nervous/anxious.     As per HPI. Otherwise, a complete review of systems is negative.  PAST MEDICAL HISTORY: Past Medical History:  Diagnosis Date  . Arthritis     . Blood dyscrasia   . Cancer of left lung ([place]) 08/2015   Rad + chemo tx's.  . Diabetes mellitus without complication ([place])   . Diabetic neuropathy ([place])   . High cholesterol   . Hypertension   . Insulin dependent diabetes mellitus ([place])     PAST SURGICAL HISTORY: Past Surgical History:  Procedure Laterality Date  . ABDOMINAL HYSTERECTOMY    . CESAREAN SECTION    . ELECTROMAGNETIC NAVIGATION BROCHOSCOPY N/A 11/26/2015   Procedure: ELECTROMAGNETIC NAVIGATION BRONCHOSCOPY;  Surgeon: Flora Lipps, MD;  Location: [facility];  Service: Cardiopulmonary;  Laterality: N/A;  . ENDOBRONCHIAL ULTRASOUND N/A 11/26/2015   Procedure: ENDOBRONCHIAL ULTRASOUND;  Surgeon: Flora Lipps, MD;  Location: [facility];  Service: Cardiopulmonary;  Laterality: N/A;  . PERIPHERAL VASCULAR CATHETERIZATION N/A 12/09/2015   Procedure: Glori Luis Cath Insertion;  Surgeon: Katha Cabal, MD;  Location: [facility];  Service: Cardiovascular;  Laterality: N/A;    FAMILY HISTORY: Reviewed and unchanged. No reported history of malignancy or chronic disease.     ADVANCED DIRECTIVES:    HEALTH MAINTENANCE: Social History   Tobacco Use  . Smoking status: Current Every Day Smoker    Packs/day: 0.30    Years: 35.00    Pack years: 10.50    Types: Cigarettes  . Smokeless tobacco: Never Used  . Tobacco comment: workiing on quitting, down to 2 packs per week now  Substance Use Topics  . Alcohol use: No    Alcohol/week: 0.0 oz  . Drug use: No     No Known Allergies  Current Outpatient Medications  Medication Sig Dispense Refill  . insulin glargine (LANTUS) 100 UNIT/ML injection Inject 60 Units into the skin daily.     Marland Kitchen lidocaine-prilocaine (  EMLA) cream Apply to affected area once 30 g 6  . magic mouthwash w/lidocaine SOLN Take 5 mLs by mouth 4 (four) times daily. 240 mL 1  . ondansetron (ZOFRAN) 8 MG tablet Take 1 tablet (8 mg total) by mouth 2 (two) times daily as needed for refractory nausea / vomiting.  30 tablet 2  . oxyCODONE (OXY IR/ROXICODONE) 5 MG immediate release tablet Take 1 tablet (5 mg total) by mouth every 6 (six) hours as needed for severe pain. 30 tablet 0  . albuterol (PROVENTIL HFA;VENTOLIN HFA) 108 (90 Base) MCG/ACT inhaler Inhale 2 puffs into the lungs every 6 (six) hours as needed for wheezing or shortness of breath. (Patient not taking: Reported on 01/29/2018) 1 Inhaler 2  . atorvastatin (LIPITOR) 20 MG tablet Take 20 mg by mouth daily at 6 PM.    . gabapentin (NEURONTIN) 300 MG capsule Take 1 capsule (300 mg total) by mouth at bedtime. (Patient not taking: Reported on 02/05/2018) 30 capsule 2  . glimepiride (AMARYL) 4 MG tablet Take 4 mg by mouth daily with breakfast.     . levothyroxine (SYNTHROID, LEVOTHROID) 150 MCG tablet Take 200 mcg by mouth daily before breakfast.     . linagliptin (TRADJENTA) 5 MG TABS tablet Take 5 mg by mouth daily.     Marland Kitchen lisinopril (PRINIVIL,ZESTRIL) 10 MG tablet Take 10 mg by mouth daily.    . prochlorperazine (COMPAZINE) 10 MG tablet Take 1 tablet (10 mg total) by mouth every 6 (six) hours as needed (Nausea or vomiting). (Patient not taking: Reported on 01/29/2018) 60 tablet 2   Current Facility-Administered Medications  Medication Dose Route Frequency Provider Last Rate Last Dose  . dexamethasone (DECADRON) injection 10 mg  10 mg Intravenous Once [name], MD       Facility-Administered Medications Ordered in Other Visits  Medication Dose Route Frequency Provider Last Rate Last Dose  . gemcitabine (GEMZAR) 1,800 mg in sodium chloride 0.9 % 250 mL chemo infusion  1,800 mg Intravenous Once [name], MD      . heparin lock flush 100 unit/mL  500 Units Intravenous Once [name], MD      . sodium chloride flush (NS) 0.9 % injection 10 mL  10 mL Intravenous PRN [name], MD   10 mL at 02/05/18 0830    OBJECTIVE: Vitals:   02/05/18 0903  BP: 108/70  Pulse: 97  Resp: 18  Temp: (!) 96.4 F (35.8 C)      Body mass index is 42.27 kg/m.    ECOG FS:1 - Symptomatic but completely ambulatory  General: Well-developed, well-nourished, no acute distress. HEENT: Clear oropharynx without ulcerations.   Eyes: Pink conjunctiva, anicteric sclera. Lungs: Clear to auscultation bilaterally. Heart: Regular rate and rhythm. No rubs, murmurs, or gallops. Abdomen: Soft, nontender, nondistended. No organomegaly noted, normoactive bowel sounds. Musculoskeletal: No edema, cyanosis, or clubbing. Neuro: Alert, answering all questions appropriately. Cranial nerves grossly intact. Skin: No rashes or petechiae noted. Psych: Normal affect.   LAB RESULTS:  Lab Results  Component Value Date   NA 136 02/05/2018   K 4.9 02/05/2018   CL 103 02/05/2018   CO2 25 02/05/2018   GLUCOSE 268 (H) 02/05/2018   BUN 19 02/05/2018   CREATININE 1.03 (H) 02/05/2018   CALCIUM 8.6 (L) 02/05/2018   PROT 6.8 02/05/2018   ALBUMIN 3.3 (L) 02/05/2018   AST 32 02/05/2018   ALT 23 02/05/2018   ALKPHOS 77 02/05/2018   BILITOT 0.7 02/05/2018  GFRNONAA 55 (L) 02/05/2018   GFRAA >60 02/05/2018    Lab Results  Component Value Date   WBC 1.6 (L) 02/05/2018   NEUTROABS 0.9 (L) 02/05/2018   HGB 7.9 (L) 02/05/2018   HCT 22.6 (L) 02/05/2018   MCV 91.5 02/05/2018   PLT 113 (L) 02/05/2018   Lab Results  Component Value Date   IRON 42 07/31/2017   TIBC 280 07/31/2017   IRONPCTSAT 15 07/31/2017    Lab Results  Component Value Date   FERRITIN 81 07/31/2017     STUDIES: No results found.  Oncology treatment history:  Patient completed her initial treatment with concurrent chemotherapy and XRT on March 07, 2016. She then underwent 2 cycles of consolidation carboplatinum and Taxol completing on Apr 27, 2016.  CT scan results from Apr 18, 2017 revealed progression of disease. She subsequently received additional XRT and proceeded with nivolumab every 2 weeks.  She completed a total of 13 infusions of nivolumab on November 06, 2017.  PET scan on November 30, 2017 revealed progression of disease.  Patient started carboplatinum and gemcitabine on December 11, 2017.  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 PET scan results from November 30, 2017 reviewed independently with new enlarged subcarinal lymph node as well as progression of disease in her left upper lobe.  Nivolumab has been discontinued.  Proceed with cycle 3, day 8 of carboplatinum and gemcitabine today despite mild neutropenia and thrombocytopenia.  Gemcitabine only today.  Previously, cycle 2, day 8 was skipped secondary to persistent thrombocytopenia. Gemcitabine has been dose reduced 20% and carboplatinum dose reduced to AUC 4.  Return to clinic in 2 weeks for further evaluation and consideration of cycle 4, day 1.  Will reimage in March 2019 at the conclusion of cycle 4.   2. Headaches/dizziness: MRI of the brain reviewed independently with no obvious metastatic disease. 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 Okay to proceed from an oncology standpoint.  Continue follow-up with neurosurgery as indicated.  Patient reports her next appointment is in ~3 months. 3. Anemia: Decreased but stable. Previously iron stores were within normal limits, monitor.  Will draw extra tube for cross mass at next lab draw for consideration of transfusion. 4. Thrombocytopenia: Proceed with dose reduce carboplatinum and gemcitabine as above.   5. Hyperglycemia: Continue diabetic medications as prescribed.  Appreciate endocrinology input.   6. Pain: Patient does not complain of this today.  Patient was given a refill of oxycodone today. 7. Thyroid: Patient's symptoms have significantly improved since increasing her Synthroid dose. Appreciate endocrinology input who will now be monitoring and adjusting  her medications as needed. 8.  Neutropenia: Secondary to chemotherapy.  Proceed cautiously with treatment as above.  Patient expressed understanding and was in agreement with this plan. She also understands that She can call clinic at any time with any questions, concerns, or complaints.    [name], MD   02/05/2018 10:31 AM

## 2018-02-05 ENCOUNTER — Other Ambulatory Visit: Payer: Self-pay

## 2018-02-05 ENCOUNTER — Inpatient Hospital Stay: Payer: PPO | Attending: Oncology

## 2018-02-05 ENCOUNTER — Inpatient Hospital Stay (HOSPITAL_BASED_OUTPATIENT_CLINIC_OR_DEPARTMENT_OTHER): Payer: PPO | Admitting: Oncology

## 2018-02-05 ENCOUNTER — Inpatient Hospital Stay: Payer: PPO

## 2018-02-05 VITALS — BP 108/70 | HR 97 | Temp 96.4°F | Resp 18 | Wt 261.9 lb

## 2018-02-05 VITALS — BP 126/70 | HR 85 | Resp 20

## 2018-02-05 DIAGNOSIS — I10 Essential (primary) hypertension: Secondary | ICD-10-CM | POA: Diagnosis not present

## 2018-02-05 DIAGNOSIS — D649 Anemia, unspecified: Secondary | ICD-10-CM

## 2018-02-05 DIAGNOSIS — D701 Agranulocytosis secondary to cancer chemotherapy: Secondary | ICD-10-CM

## 2018-02-05 DIAGNOSIS — E1140 Type 2 diabetes mellitus with diabetic neuropathy, unspecified: Secondary | ICD-10-CM | POA: Diagnosis not present

## 2018-02-05 DIAGNOSIS — D61810 Antineoplastic chemotherapy induced pancytopenia: Secondary | ICD-10-CM | POA: Insufficient documentation

## 2018-02-05 DIAGNOSIS — E1165 Type 2 diabetes mellitus with hyperglycemia: Secondary | ICD-10-CM | POA: Diagnosis not present

## 2018-02-05 DIAGNOSIS — R42 Dizziness and giddiness: Secondary | ICD-10-CM | POA: Insufficient documentation

## 2018-02-05 DIAGNOSIS — R531 Weakness: Secondary | ICD-10-CM | POA: Insufficient documentation

## 2018-02-05 DIAGNOSIS — D696 Thrombocytopenia, unspecified: Secondary | ICD-10-CM

## 2018-02-05 DIAGNOSIS — Z5111 Encounter for antineoplastic chemotherapy: Secondary | ICD-10-CM | POA: Diagnosis not present

## 2018-02-05 DIAGNOSIS — C3412 Malignant neoplasm of upper lobe, left bronchus or lung: Secondary | ICD-10-CM | POA: Diagnosis not present

## 2018-02-05 DIAGNOSIS — R51 Headache: Secondary | ICD-10-CM

## 2018-02-05 DIAGNOSIS — R5383 Other fatigue: Secondary | ICD-10-CM | POA: Diagnosis present

## 2018-02-05 LAB — CBC WITH DIFFERENTIAL/PLATELET
Basophils Absolute: 0 10*3/uL (ref 0–0.1)
Basophils Relative: 1 %
Eosinophils Absolute: 0 10*3/uL (ref 0–0.7)
Eosinophils Relative: 0 %
HEMATOCRIT: 22.6 % — AB (ref 35.0–47.0)
HEMOGLOBIN: 7.9 g/dL — AB (ref 12.0–16.0)
LYMPHS ABS: 0.7 10*3/uL — AB (ref 1.0–3.6)
LYMPHS PCT: 42 %
MCH: 31.9 pg (ref 26.0–34.0)
MCHC: 34.9 g/dL (ref 32.0–36.0)
MCV: 91.5 fL (ref 80.0–100.0)
MONOS PCT: 2 %
Monocytes Absolute: 0 10*3/uL — ABNORMAL LOW (ref 0.2–0.9)
NEUTROS PCT: 55 %
Neutro Abs: 0.9 10*3/uL — ABNORMAL LOW (ref 1.4–6.5)
Platelets: 113 10*3/uL — ABNORMAL LOW (ref 150–440)
RBC: 2.47 MIL/uL — AB (ref 3.80–5.20)
RDW: 15.7 % — ABNORMAL HIGH (ref 11.5–14.5)
WBC: 1.6 10*3/uL — AB (ref 3.6–11.0)

## 2018-02-05 LAB — COMPREHENSIVE METABOLIC PANEL
ALT: 23 U/L (ref 14–54)
AST: 32 U/L (ref 15–41)
Albumin: 3.3 g/dL — ABNORMAL LOW (ref 3.5–5.0)
Alkaline Phosphatase: 77 U/L (ref 38–126)
Anion gap: 8 (ref 5–15)
BUN: 19 mg/dL (ref 6–20)
CHLORIDE: 103 mmol/L (ref 101–111)
CO2: 25 mmol/L (ref 22–32)
Calcium: 8.6 mg/dL — ABNORMAL LOW (ref 8.9–10.3)
Creatinine, Ser: 1.03 mg/dL — ABNORMAL HIGH (ref 0.44–1.00)
GFR, EST NON AFRICAN AMERICAN: 55 mL/min — AB (ref >60)
Glucose, Bld: 268 mg/dL — ABNORMAL HIGH (ref 65–99)
POTASSIUM: 4.9 mmol/L (ref 3.5–5.1)
SODIUM: 136 mmol/L (ref 135–145)
Total Bilirubin: 0.7 mg/dL (ref 0.3–1.2)
Total Protein: 6.8 g/dL (ref 6.5–8.1)

## 2018-02-05 MED ORDER — SODIUM CHLORIDE 0.9 % IV SOLN
Freq: Once | INTRAVENOUS | Status: AC
  Administered 2018-02-05: 10:00:00 via INTRAVENOUS
  Filled 2018-02-05: qty 1000

## 2018-02-05 MED ORDER — PROCHLORPERAZINE MALEATE 10 MG PO TABS
10.0000 mg | ORAL_TABLET | Freq: Once | ORAL | Status: AC
  Administered 2018-02-05: 10 mg via ORAL
  Filled 2018-02-05: qty 1

## 2018-02-05 MED ORDER — SODIUM CHLORIDE 0.9 % IV SOLN
1800.0000 mg | Freq: Once | INTRAVENOUS | Status: AC
  Administered 2018-02-05: 1800 mg via INTRAVENOUS
  Filled 2018-02-05: qty 21.04

## 2018-02-05 MED ORDER — DEXAMETHASONE SODIUM PHOSPHATE 10 MG/ML IJ SOLN
10.0000 mg | Freq: Once | INTRAMUSCULAR | Status: AC
  Administered 2018-02-05: 10 mg via INTRAVENOUS
  Filled 2018-02-05: qty 1

## 2018-02-05 MED ORDER — HEPARIN SOD (PORK) LOCK FLUSH 100 UNIT/ML IV SOLN
500.0000 [IU] | Freq: Once | INTRAVENOUS | Status: AC
  Administered 2018-02-05: 500 [IU] via INTRAVENOUS
  Filled 2018-02-05: qty 5

## 2018-02-05 MED ORDER — SODIUM CHLORIDE 0.9% FLUSH
10.0000 mL | INTRAVENOUS | Status: DC | PRN
  Administered 2018-02-05: 10 mL via INTRAVENOUS
  Filled 2018-02-05: qty 10

## 2018-02-05 NOTE — Progress Notes (Signed)
Hgb: 7.9. MD, Dr. Grayland Ormond, notified and aware. Per MD order: proceed with scheduled treatment today.

## 2018-02-05 NOTE — Progress Notes (Signed)
Here for follow up. Nausea-taking zofran w effect she stated

## 2018-02-06 DIAGNOSIS — C3412 Malignant neoplasm of upper lobe, left bronchus or lung: Secondary | ICD-10-CM | POA: Diagnosis not present

## 2018-02-15 ENCOUNTER — Other Ambulatory Visit: Payer: Self-pay

## 2018-02-15 ENCOUNTER — Encounter: Payer: Self-pay | Admitting: Emergency Medicine

## 2018-02-15 ENCOUNTER — Inpatient Hospital Stay (HOSPITAL_BASED_OUTPATIENT_CLINIC_OR_DEPARTMENT_OTHER): Payer: PPO | Admitting: Oncology

## 2018-02-15 ENCOUNTER — Telehealth: Payer: Self-pay | Admitting: *Deleted

## 2018-02-15 ENCOUNTER — Emergency Department: Payer: PPO

## 2018-02-15 ENCOUNTER — Inpatient Hospital Stay
Admission: EM | Admit: 2018-02-15 | Discharge: 2018-02-17 | DRG: 808 | Disposition: A | Discharge status: Home Health | Payer: PPO | Attending: Internal Medicine | Admitting: Internal Medicine

## 2018-02-15 ENCOUNTER — Inpatient Hospital Stay: Payer: PPO

## 2018-02-15 VITALS — BP 123/73 | HR 86 | Temp 98.0°F | Resp 20

## 2018-02-15 DIAGNOSIS — D61810 Antineoplastic chemotherapy induced pancytopenia: Principal | ICD-10-CM | POA: Diagnosis present

## 2018-02-15 DIAGNOSIS — Z90710 Acquired absence of both cervix and uterus: Secondary | ICD-10-CM

## 2018-02-15 DIAGNOSIS — D6959 Other secondary thrombocytopenia: Secondary | ICD-10-CM | POA: Diagnosis not present

## 2018-02-15 DIAGNOSIS — E785 Hyperlipidemia, unspecified: Secondary | ICD-10-CM | POA: Diagnosis not present

## 2018-02-15 DIAGNOSIS — R531 Weakness: Secondary | ICD-10-CM

## 2018-02-15 DIAGNOSIS — Z79890 Hormone replacement therapy (postmenopausal): Secondary | ICD-10-CM

## 2018-02-15 DIAGNOSIS — D649 Anemia, unspecified: Secondary | ICD-10-CM | POA: Diagnosis not present

## 2018-02-15 DIAGNOSIS — G935 Compression of brain: Secondary | ICD-10-CM | POA: Diagnosis not present

## 2018-02-15 DIAGNOSIS — I959 Hypotension, unspecified: Secondary | ICD-10-CM | POA: Diagnosis not present

## 2018-02-15 DIAGNOSIS — T451X5A Adverse effect of antineoplastic and immunosuppressive drugs, initial encounter: Secondary | ICD-10-CM | POA: Diagnosis not present

## 2018-02-15 DIAGNOSIS — R42 Dizziness and giddiness: Secondary | ICD-10-CM | POA: Diagnosis not present

## 2018-02-15 DIAGNOSIS — Z794 Long term (current) use of insulin: Secondary | ICD-10-CM

## 2018-02-15 DIAGNOSIS — E039 Hypothyroidism, unspecified: Secondary | ICD-10-CM | POA: Diagnosis not present

## 2018-02-15 DIAGNOSIS — I10 Essential (primary) hypertension: Secondary | ICD-10-CM | POA: Diagnosis present

## 2018-02-15 DIAGNOSIS — Z66 Do not resuscitate: Secondary | ICD-10-CM | POA: Diagnosis present

## 2018-02-15 DIAGNOSIS — C3412 Malignant neoplasm of upper lobe, left bronchus or lung: Secondary | ICD-10-CM | POA: Diagnosis not present

## 2018-02-15 DIAGNOSIS — D696 Thrombocytopenia, unspecified: Secondary | ICD-10-CM

## 2018-02-15 DIAGNOSIS — Z79899 Other long term (current) drug therapy: Secondary | ICD-10-CM | POA: Diagnosis not present

## 2018-02-15 DIAGNOSIS — R1310 Dysphagia, unspecified: Secondary | ICD-10-CM | POA: Diagnosis present

## 2018-02-15 DIAGNOSIS — C3492 Malignant neoplasm of unspecified part of left bronchus or lung: Secondary | ICD-10-CM | POA: Diagnosis not present

## 2018-02-15 DIAGNOSIS — E1165 Type 2 diabetes mellitus with hyperglycemia: Secondary | ICD-10-CM | POA: Diagnosis not present

## 2018-02-15 DIAGNOSIS — E1140 Type 2 diabetes mellitus with diabetic neuropathy, unspecified: Secondary | ICD-10-CM | POA: Diagnosis present

## 2018-02-15 DIAGNOSIS — D701 Agranulocytosis secondary to cancer chemotherapy: Secondary | ICD-10-CM | POA: Diagnosis not present

## 2018-02-15 DIAGNOSIS — E1151 Type 2 diabetes mellitus with diabetic peripheral angiopathy without gangrene: Secondary | ICD-10-CM | POA: Diagnosis present

## 2018-02-15 DIAGNOSIS — M1990 Unspecified osteoarthritis, unspecified site: Secondary | ICD-10-CM | POA: Diagnosis not present

## 2018-02-15 DIAGNOSIS — R51 Headache: Secondary | ICD-10-CM | POA: Diagnosis not present

## 2018-02-15 DIAGNOSIS — C3490 Malignant neoplasm of unspecified part of unspecified bronchus or lung: Secondary | ICD-10-CM | POA: Diagnosis not present

## 2018-02-15 DIAGNOSIS — Z5189 Encounter for other specified aftercare: Secondary | ICD-10-CM | POA: Diagnosis not present

## 2018-02-15 DIAGNOSIS — H5010 Unspecified exotropia: Secondary | ICD-10-CM | POA: Diagnosis not present

## 2018-02-15 DIAGNOSIS — F17210 Nicotine dependence, cigarettes, uncomplicated: Secondary | ICD-10-CM | POA: Diagnosis not present

## 2018-02-15 DIAGNOSIS — Z5111 Encounter for antineoplastic chemotherapy: Secondary | ICD-10-CM | POA: Diagnosis not present

## 2018-02-15 DIAGNOSIS — R410 Disorientation, unspecified: Secondary | ICD-10-CM

## 2018-02-15 DIAGNOSIS — R9402 Abnormal brain scan: Secondary | ICD-10-CM | POA: Diagnosis not present

## 2018-02-15 DIAGNOSIS — D61818 Other pancytopenia: Secondary | ICD-10-CM | POA: Diagnosis not present

## 2018-02-15 DIAGNOSIS — R5383 Other fatigue: Secondary | ICD-10-CM | POA: Diagnosis not present

## 2018-02-15 LAB — CBC WITH DIFFERENTIAL/PLATELET
Basophils Absolute: 0 10*3/uL (ref 0–0.1)
Basophils Absolute: 0 10*3/uL (ref 0–0.1)
Basophils Relative: 0 %
Basophils Relative: 0 %
EOS ABS: 0 10*3/uL (ref 0–0.7)
EOS PCT: 1 %
Eosinophils Absolute: 0 10*3/uL (ref 0–0.7)
Eosinophils Relative: 1 %
HCT: 19.8 % — ABNORMAL LOW (ref 35.0–47.0)
HCT: 21 % — ABNORMAL LOW (ref 35.0–47.0)
HEMOGLOBIN: 6.7 g/dL — AB (ref 12.0–16.0)
Hemoglobin: 7 g/dL — ABNORMAL LOW (ref 12.0–16.0)
LYMPHS ABS: 0.7 10*3/uL — AB (ref 1.0–3.6)
LYMPHS ABS: 0.6 10*3/uL — AB (ref 1.0–3.6)
LYMPHS PCT: 39 %
Lymphocytes Relative: 50 %
MCH: 31.1 pg (ref 26.0–34.0)
MCH: 30.3 pg (ref 26.0–34.0)
MCHC: 34 g/dL (ref 32.0–36.0)
MCHC: 33.4 g/dL (ref 32.0–36.0)
MCV: 91.2 fL (ref 80.0–100.0)
MCV: 90.5 fL (ref 80.0–100.0)
MONO ABS: 0.1 10*3/uL — AB (ref 0.2–0.9)
MONOS PCT: 5 %
Monocytes Absolute: 0.1 10*3/uL — ABNORMAL LOW (ref 0.2–0.9)
Monocytes Relative: 5 %
NEUTROS ABS: 0.7 10*3/uL — AB (ref 1.4–6.5)
NEUTROS PCT: 44 %
Neutro Abs: 0.8 10*3/uL — ABNORMAL LOW (ref 1.4–6.5)
Neutrophils Relative %: 55 %
PLATELETS: 14 10*3/uL — AB (ref 150–440)
Platelets: 14 10*3/uL — CL (ref 150–400)
RBC: 2.17 MIL/uL — ABNORMAL LOW (ref 3.80–5.20)
RBC: 2.32 MIL/uL — AB (ref 3.80–5.20)
RDW: 15.5 % — ABNORMAL HIGH (ref 11.5–14.5)
RDW: 16.1 % — ABNORMAL HIGH (ref 11.5–14.5)
WBC: 1.5 10*3/uL — ABNORMAL LOW (ref 3.6–11.0)
WBC: 1.4 10*3/uL — AB (ref 3.6–11.0)

## 2018-02-15 LAB — COMPREHENSIVE METABOLIC PANEL
ALBUMIN: 3.2 g/dL — AB (ref 3.5–5.0)
ALT: 17 U/L (ref 14–54)
ALT: 18 U/L (ref 14–54)
ANION GAP: 9 (ref 5–15)
ANION GAP: 9 (ref 5–15)
AST: 31 U/L (ref 15–41)
AST: 30 U/L (ref 15–41)
Albumin: 3.3 g/dL — ABNORMAL LOW (ref 3.5–5.0)
Alkaline Phosphatase: 74 U/L (ref 38–126)
Alkaline Phosphatase: 68 U/L (ref 38–126)
BUN: 13 mg/dL (ref 6–20)
BUN: 13 mg/dL (ref 6–20)
CHLORIDE: 104 mmol/L (ref 101–111)
CHLORIDE: 105 mmol/L (ref 101–111)
CO2: 23 mmol/L (ref 22–32)
CO2: 24 mmol/L (ref 22–32)
CREATININE: 1.06 mg/dL — AB (ref 0.44–1.00)
CREATININE: 1.08 mg/dL — AB (ref 0.44–1.00)
Calcium: 9 mg/dL (ref 8.9–10.3)
Calcium: 9 mg/dL (ref 8.9–10.3)
GFR calc non Af Amer: 53 mL/min — ABNORMAL LOW (ref >60)
GFR, EST AFRICAN AMERICAN: 60 mL/min — AB (ref >60)
GFR, EST NON AFRICAN AMERICAN: 52 mL/min — AB (ref >60)
Glucose, Bld: 225 mg/dL — ABNORMAL HIGH (ref 65–99)
Glucose, Bld: 171 mg/dL — ABNORMAL HIGH (ref 65–99)
Potassium: 3.9 mmol/L (ref 3.5–5.1)
Potassium: 4 mmol/L (ref 3.5–5.1)
SODIUM: 136 mmol/L (ref 135–145)
SODIUM: 138 mmol/L (ref 135–145)
Total Bilirubin: 0.4 mg/dL (ref 0.3–1.2)
Total Bilirubin: 0.5 mg/dL (ref 0.3–1.2)
Total Protein: 6.7 g/dL (ref 6.5–8.1)
Total Protein: 7 g/dL (ref 6.5–8.1)

## 2018-02-15 LAB — LACTATE DEHYDROGENASE: LDH: 238 U/L — AB (ref 98–192)

## 2018-02-15 LAB — PROTIME-INR
INR: 0.92
PROTHROMBIN TIME: 12.3 s (ref 11.4–15.2)

## 2018-02-15 LAB — HEMOGLOBIN A1C
HEMOGLOBIN A1C: 11 % — AB (ref 4.8–5.6)
MEAN PLASMA GLUCOSE: 269 mg/dL

## 2018-02-15 LAB — PREPARE RBC (CROSSMATCH)

## 2018-02-15 LAB — TROPONIN I

## 2018-02-15 LAB — GLUCOSE, CAPILLARY
GLUCOSE-CAPILLARY: 168 mg/dL — AB (ref 65–99)
Glucose-Capillary: 126 mg/dL — ABNORMAL HIGH (ref 65–99)

## 2018-02-15 LAB — FIBRINOGEN: Fibrinogen: 436 mg/dL (ref 210–475)

## 2018-02-15 LAB — APTT: aPTT: 32 seconds (ref 24–36)

## 2018-02-15 LAB — ABO/RH: ABO/RH(D): O POS

## 2018-02-15 MED ORDER — OXYCODONE HCL 5 MG PO TABS: 5.0000 mg | ORAL_TABLET | Freq: Four times a day (QID) | ORAL | Status: DC | PRN

## 2018-02-15 MED ORDER — ACETAMINOPHEN 650 MG RE SUPP: 650.0000 mg | Freq: Four times a day (QID) | RECTAL | Status: DC | PRN

## 2018-02-15 MED ORDER — ATORVASTATIN CALCIUM 20 MG PO TABS
20.0000 mg | ORAL_TABLET | Freq: Every day | ORAL | Status: DC
  Administered 2018-02-15 – 2018-02-16 (×2): 20 mg via ORAL
  Filled 2018-02-15 (×2): qty 1

## 2018-02-15 MED ORDER — SODIUM CHLORIDE 0.9 % IV SOLN
10.0000 mL/h | Freq: Once | INTRAVENOUS | Status: AC
  Administered 2018-02-15: 10 mL/h via INTRAVENOUS

## 2018-02-15 MED ORDER — LEVOTHYROXINE SODIUM 100 MCG PO TABS
200.0000 ug | ORAL_TABLET | Freq: Every day | ORAL | Status: DC
  Administered 2018-02-16 – 2018-02-17 (×2): 200 ug via ORAL
  Filled 2018-02-15: qty 4
  Filled 2018-02-15: qty 2

## 2018-02-15 MED ORDER — INSULIN GLARGINE 100 UNIT/ML ~~LOC~~ SOLN
52.0000 [IU] | Freq: Every day | SUBCUTANEOUS | Status: DC
  Administered 2018-02-16 – 2018-02-17 (×2): 52 [IU] via SUBCUTANEOUS
  Filled 2018-02-15 (×2): qty 0.52

## 2018-02-15 MED ORDER — POLYETHYLENE GLYCOL 3350 17 G PO PACK: 17.0000 g | PACK | Freq: Every day | ORAL | Status: DC | PRN

## 2018-02-15 MED ORDER — ALBUTEROL SULFATE (2.5 MG/3ML) 0.083% IN NEBU: 2.5000 mg | INHALATION_SOLUTION | RESPIRATORY_TRACT | Status: DC | PRN

## 2018-02-15 MED ORDER — ONDANSETRON HCL 4 MG PO TABS: 4.0000 mg | ORAL_TABLET | Freq: Four times a day (QID) | ORAL | Status: DC | PRN

## 2018-02-15 MED ORDER — SODIUM CHLORIDE 0.9 % IV SOLN: 10.0000 mL/h | Freq: Once | INTRAVENOUS | Status: DC

## 2018-02-15 MED ORDER — ACETAMINOPHEN 325 MG PO TABS: 650.0000 mg | ORAL_TABLET | Freq: Four times a day (QID) | ORAL | Status: DC | PRN

## 2018-02-15 MED ORDER — INSULIN ASPART 100 UNIT/ML ~~LOC~~ SOLN: 0.0000 [IU] | Freq: Every day | SUBCUTANEOUS | Status: DC

## 2018-02-15 MED ORDER — INSULIN ASPART 100 UNIT/ML ~~LOC~~ SOLN
0.0000 [IU] | Freq: Three times a day (TID) | SUBCUTANEOUS | Status: DC
  Administered 2018-02-15: 2 [IU] via SUBCUTANEOUS
  Administered 2018-02-16 (×3): 3 [IU] via SUBCUTANEOUS
  Administered 2018-02-17 (×2): 2 [IU] via SUBCUTANEOUS
  Filled 2018-02-15 (×6): qty 1

## 2018-02-15 MED ORDER — ONDANSETRON HCL 4 MG/2ML IJ SOLN: 4.0000 mg | Freq: Four times a day (QID) | INTRAMUSCULAR | Status: DC | PRN

## 2018-02-15 NOTE — Consult Note (Signed)
Referring Physician:  No referring provider defined for this encounter.  Primary Physician:  [name], MD  Chief Complaint:  pancytopenia  History of Present Illness: 02/15/2018  Susan Willis is a 67 yo female who presented to the hospital with pancytopenia and ill health.  She has previously seen my partner, Dr. Aris Lot (note quoted below), for Chiari malformation.  Susan Willis reports that she was feeling very week and dizzy prior to admission, but is now feeling much better.  She was found to have a platelet count of 14 with a Hgb of 6.7.  Of note, she is on chemotherapy for cancer, and had to hold a treatment this week due to low blood counts.  Currently she denies headache, nausea, or vomiting.   01/30/18 from [facility] note: History of Present Illness: Susan Willis is a 67 y.o. female who presents today for follow-up of her known dizziness with a Chiari malformation. She also has stage III a squamous cell carcinoma of the lung. She has been followed by oncology is currently on treatment in this regard. Last seen in July 2018 where she had complaints of not only dizziness but difficulty with tussive headaches and Valsalva-like maneuvers. She denied any upper extremity symptoms. Discussed with her at that time that based on her symptoms and extent of Chiari that I would favor surgical intervention but with her known metastatic disease that on restaging showed progression that would not favor intervention at that time due to the risk of surgical complications. Since that time continues to struggle with dizziness. She states she has difficulty swallowing states it is positional. She continues to smoke approximately 1 pack/day of tobacco. She states her blood sugar will range from 100-300. Her last hemoglobin A1c in November was 10.5.   Review of Systems:  A 10 point review of systems is negative, except for the pertinent positives and negatives detailed in the HPI.  Past  Medical History: Past Medical History:  Diagnosis Date  . Arthritis   . Blood dyscrasia   . Cancer of left lung ([place]) 08/2015   Rad + chemo tx's.  . Diabetes mellitus without complication ([place])   . Diabetic neuropathy ([place])   . High cholesterol   . Hypertension   . Insulin dependent diabetes mellitus ([place])     Past Surgical History: Past Surgical History:  Procedure Laterality Date  . ABDOMINAL HYSTERECTOMY    . CESAREAN SECTION    . ELECTROMAGNETIC NAVIGATION BROCHOSCOPY N/A 11/26/2015   Procedure: ELECTROMAGNETIC NAVIGATION BRONCHOSCOPY;  Surgeon: Flora Lipps, MD;  Location: [facility];  Service: Cardiopulmonary;  Laterality: N/A;  . ENDOBRONCHIAL ULTRASOUND N/A 11/26/2015   Procedure: ENDOBRONCHIAL ULTRASOUND;  Surgeon: Flora Lipps, MD;  Location: [facility];  Service: Cardiopulmonary;  Laterality: N/A;  . PERIPHERAL VASCULAR CATHETERIZATION N/A 12/09/2015   Procedure: Glori Luis Cath Insertion;  Surgeon: Katha Cabal, MD;  Location: [facility];  Service: Cardiovascular;  Laterality: N/A;    Allergies: Allergies as of 02/15/2018  . (No Known Allergies)    Medications:  Current Facility-Administered Medications:  .  0.9 %  sodium chloride infusion, 10 mL/hr, Intravenous, Once, Alfred Levins, [place], MD .  acetaminophen (TYLENOL) tablet 650 mg, 650 mg, Oral, Q6H PRN **OR** acetaminophen (TYLENOL) suppository 650 mg, 650 mg, Rectal, Q6H PRN, Sudini, Srikar, MD .  albuterol (PROVENTIL) (2.5 MG/3ML) 0.083% nebulizer solution 2.5 mg, 2.5 mg, Nebulization, Q2H PRN, Sudini, Srikar, MD .  atorvastatin (LIPITOR) tablet 20 mg, 20 mg, Oral, q1800, Sudini, Srikar,  MD, 20 mg at 02/15/18 1717 .  insulin aspart (novoLOG) injection 0-15 Units, 0-15 Units, Subcutaneous, TID WC, Hillary Bow, MD, 2 Units at 02/15/18 1718 .  insulin aspart (novoLOG) injection 0-5 Units, 0-5 Units, Subcutaneous, QHS, Sudini, Srikar, MD .  Derrill Memo ON 02/16/2018] insulin glargine (LANTUS) injection 52 Units, 52  Units, Subcutaneous, Daily, Sudini, Alveta Heimlich, MD .  Derrill Memo ON 02/16/2018] levothyroxine (SYNTHROID, LEVOTHROID) tablet 200 mcg, 200 mcg, Oral, Q0600, Sudini, Srikar, MD .  ondansetron (ZOFRAN) tablet 4 mg, 4 mg, Oral, Q6H PRN **OR** ondansetron (ZOFRAN) injection 4 mg, 4 mg, Intravenous, Q6H PRN, Sudini, Srikar, MD .  oxyCODONE (Oxy IR/ROXICODONE) immediate release tablet 5 mg, 5 mg, Oral, Q6H PRN, Sudini, Srikar, MD .  polyethylene glycol (MIRALAX / GLYCOLAX) packet 17 g, 17 g, Oral, Daily PRN, Hillary Bow, MD   Social History: Social History   Tobacco Use  . Smoking status: Current Every Day Smoker    Packs/day: 0.30    Years: 35.00    Pack years: 10.50    Types: Cigarettes  . Smokeless tobacco: Never Used  . Tobacco comment: workiing on quitting, down to 2 packs per week now  Substance Use Topics  . Alcohol use: No    Alcohol/week: 0.0 oz  . Drug use: No    Family Medical History: No family history on file.  Physical Examination: Vitals:   02/15/18 1651 02/15/18 1728  BP: 106/60 (!) 113/59  Pulse: 82 85  Resp:  18  Temp: 97.6 F (36.4 C) 98 F (36.7 C)  SpO2: 100% 100%     General: Patient is well developed, well nourished, calm, collected, and in no apparent distress.  Psychiatric: Patient is non-anxious.  Head:  Pupils equal, round, and reactive to light.  ENT:  Oral mucosa appears well hydrated.  Neck:   Supple.  Full range of motion.  Respiratory: Patient is breathing without any difficulty.  Extremities: No edema.  Vascular: Palpable pulses in dorsal pedal vessels.  Skin:   On exposed skin, there are no abnormal skin lesions.  NEUROLOGICAL:  General: In no acute distress.   Awake, alert, oriented to person, place, and time.  Pupils equal round and reactive to light.  Facial tone is symmetric.  Tongue protrusion is midline.  There is no pronator drift.  Strength: Side Biceps Triceps Deltoid Interossei Grip Wrist Ext. Wrist Flex.  R 5 5 5 5 5 5 5    L 5 5 5 5 5 5 5    Side Iliopsoas Quads Hamstring PF DF EHL  R 5 5 5 5 5 5   L 5 5 5 5 5 5    Reflexes are 2+ and symmetric at the biceps, triceps, brachioradialis, patella and achilles.   Bilateral upper and lower extremity sensation is intact to light touch and pin prick.  Clonus is not present.  Toes are down-going.  Gait is untested.  Hoffman's is absent.  Imaging: Ct Head 02/15/18 IMPRESSION: 1. Chiari I malformation. Note that the cerebellar tonsils impress upon the upper cervical cord. This finding may have clinical significance and warrants clinical assessment in this regard.  2. Mild periventricular small vessel disease. No acute infarct. No mass or hemorrhage.  3.  There are foci of arterial vascular calcification.  4.  There is mucosal thickening in several ethmoid air cells.   Electronically Signed   By: Lowella Grip III M.D.   On: 02/15/2018 14:07  I have personally reviewed the images and agree with the above interpretation.  Assessment  and Plan: Susan Willis is a pleasant 67 y.o. female with Chiari malformation who presented with pancytopenia.  On my review, her imaging is stable.  There is no acute surgical intervention required.  Once she is stabilized, she should follow up with Dr. Aris Lot as per their prior plan,   Lovie Chol. Izora Ribas MD, [place] [facility] of [facility]

## 2018-02-15 NOTE — ED Provider Notes (Signed)
[facility] Emergency Department Provider Note  ____________________________________________  Time seen: Approximately 2:30 PM  I have reviewed the triage vital signs and the nursing notes.   HISTORY  Chief Complaint abnormal labs   HPI Susan Willis is a 67 y.o. 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 patient was at the cancer center today for a visit. She was found to have worsening hemoglobin of 6.7 and platelets of 14  (7.8 and 113 respectively 10 days ago). She is also complaining of worsening dizziness and was sent to the ED for evaluation. Patient reports that her dizziness has become more severe and constant over the course of the last 2-3 weeks. She is also complaining of worsening dysphagia for the last 2 weeks. She has had intermittent numbness of the RU and RLE. She reports mild intermittent sharp/ tingling occipital HAs. She is followed by Dr. Aris Lot, [place] [facility] for her Chiari which needs surgical repair however due to patient immunosuppressed status the surgery has not been scheduled yet.  Past Medical History:  Diagnosis Date  . Arthritis   . Blood dyscrasia   . Cancer of left lung ([place]) 08/2015   Rad + chemo tx's.  . Diabetes mellitus without complication ([place])   . Diabetic neuropathy ([place])   . High cholesterol   . Hypertension   . Insulin dependent diabetes mellitus [facility])     Patient Active Problem List   Diagnosis Date Noted  . Hypothyroidism (acquired) 10/19/2017  . Coronary artery disease 07/03/2017  . Diabetes mellitus type 2, uncomplicated ([place]) 30/86/5784  . Mixed hyperlipidemia 07/03/2017  . PVD (peripheral vascular disease) ([place]) 07/03/2017  . Malignant neoplasm of upper lobe of left lung ([place]) 12/07/2015  . Dizziness 06/27/2014  . Vulvar abscess 05/05/2014  . Depressive  disorder 09/28/2012  . Generalized osteoarthrosis 09/28/2012  . Essential hypertension 09/24/2012  . Malignant neoplasm of corpus uteri ([place]) 09/24/2012  . Type II diabetes mellitus ([place]) 09/24/2012    Past Surgical History:  Procedure Laterality Date  . ABDOMINAL HYSTERECTOMY    . CESAREAN SECTION    . ELECTROMAGNETIC NAVIGATION BROCHOSCOPY N/A 11/26/2015   Procedure: ELECTROMAGNETIC NAVIGATION BRONCHOSCOPY;  Surgeon: Flora Lipps, MD;  Location: [facility] ORS;  Service: Cardiopulmonary;  Laterality: N/A;  . ENDOBRONCHIAL ULTRASOUND N/A 11/26/2015   Procedure: ENDOBRONCHIAL ULTRASOUND;  Surgeon: Flora Lipps, MD;  Location: [facility] ORS;  Service: Cardiopulmonary;  Laterality: N/A;  . PERIPHERAL VASCULAR CATHETERIZATION N/A 12/09/2015   Procedure: Glori Luis Cath Insertion;  Surgeon: Katha Cabal, MD;  Location: [place] CV LAB;  Service: Cardiovascular;  Laterality: N/A;    Prior to Admission medications   Medication Sig Start Date End Date Taking? Authorizing Provider  albuterol (PROVENTIL HFA;VENTOLIN HFA) 108 (90 Base) MCG/ACT inhaler Inhale 2 puffs into the lungs every 6 (six) hours as needed for wheezing or shortness of breath. Patient not taking: Reported on 01/29/2018 07/17/17   Verlon Au, NP  atorvastatin (LIPITOR) 20 MG tablet Take 20 mg by mouth daily at 6 PM.    [provider]  gabapentin (NEURONTIN) 300 MG capsule Take 1 capsule (300 mg total) by mouth at bedtime. Patient not taking: Reported on 02/05/2018 12/11/17   [name], MD  glimepiride (AMARYL) 4 MG tablet Take 4 mg by mouth daily with breakfast.  10/24/17 10/24/18  [provider]  insulin glargine (LANTUS) 100 UNIT/ML injection Inject 60 Units into the skin daily.     [provider]  levothyroxine (SYNTHROID, LEVOTHROID) 150 MCG tablet Take 200 mcg by mouth daily before breakfast.  10/23/17   [provider]  lidocaine-prilocaine (EMLA) cream Apply to affected area once  07/17/17   Verlon Au, NP  linagliptin (TRADJENTA) 5 MG TABS tablet Take 5 mg by mouth daily.     [provider]  lisinopril (PRINIVIL,ZESTRIL) 10 MG tablet Take 10 mg by mouth daily.    [provider]  magic mouthwash w/lidocaine SOLN Take 5 mLs by mouth 4 (four) times daily. 01/15/18   [name], MD  ondansetron (ZOFRAN) 8 MG tablet Take 1 tablet (8 mg total) by mouth 2 (two) times daily as needed for refractory nausea / vomiting. 12/04/17   [name], MD  oxyCODONE (OXY IR/ROXICODONE) 5 MG immediate release tablet Take 1 tablet (5 mg total) by mouth every 6 (six) hours as needed for severe pain. 01/30/18   [name], MD  prochlorperazine (COMPAZINE) 10 MG tablet Take 1 tablet (10 mg total) by mouth every 6 (six) hours as needed (Nausea or vomiting). Patient not taking: Reported on 02/15/2018 12/04/17   [name], MD    Allergies Patient has no known allergies.  No family history on file.  Social History Social History   Tobacco Use  . Smoking status: Current Every Day Smoker    Packs/day: 0.30    Years: 35.00    Pack years: 10.50    Types: Cigarettes  . Smokeless tobacco: Never Used  . Tobacco comment: workiing on quitting, down to 2 packs per week now  Substance Use Topics  . Alcohol use: No    Alcohol/week: 0.0 oz  . Drug use: No    Review of Systems  Constitutional: Negative for fever. Eyes: Negative for visual changes. ENT: Negative for sore throat. Neck: No neck pain  Cardiovascular: Negative for chest pain. Respiratory: Negative for shortness of breath. Gastrointestinal: Negative for abdominal pain, vomiting or diarrhea. Genitourinary: Negative for dysuria. Musculoskeletal: Negative for back pain. Skin: Negative for rash. Neurological: + occipital HAs, dizziness and intermittent R sided numbness. Psych: No SI or HI  ____________________________________________   PHYSICAL EXAM:  VITAL SIGNS: ED  Triage Vitals  Enc Vitals Group     BP 02/15/18 1328 (!) 90/54     Pulse Rate 02/15/18 1328 80     Resp 02/15/18 1328 20     Temp 02/15/18 1328 98.3 F (36.8 C)     Temp Source 02/15/18 1328 Oral     SpO2 02/15/18 1328 99 %     Weight 02/15/18 1335 261 lb (118.4 kg)     Height 02/15/18 1335 5\' 4"  (1.626 m)     Head Circumference --      Peak Flow --      Pain Score 02/15/18 1329 0     Pain Loc --      Pain Edu? --      Excl. in [place]? --     Constitutional: Alert and oriented. Well appearing and in no apparent distress. HEENT:      Head: Normocephalic and atraumatic.         Eyes: Conjunctivae are normal. Sclera is non-icteric.       Mouth/Throat: Mucous membranes are moist.       Neck: Supple with no signs of meningismus. Cardiovascular: Regular rate and rhythm. No murmurs, gallops,  or rubs. 2+ symmetrical distal pulses are present in all extremities. No JVD. Respiratory: Normal respiratory effort. Lungs are clear to auscultation bilaterally. No wheezes, crackles, or rhonchi.  Gastrointestinal: Soft, non tender, and non distended with positive bowel sounds. No rebound or guarding. Musculoskeletal: Nontender with normal range of motion in all extremities. No edema, cyanosis, or erythema of extremities. Neurologic: Normal speech and language. A & O x3, PERRL, EOMI, no nystagmus, CN II-XII intact, symmetric palate elevation, motor testing reveals good tone and bulk throughout. There is no evidence of pronator drift or dysmetria. Muscle strength is 5/5 throughout.  Sensory examination is intact. Gait deferred Skin: Skin is warm, dry and intact. No rash noted. Psychiatric: Mood and affect are normal. Speech and behavior are normal.  ____________________________________________   LABS (all labs ordered are listed, but only abnormal results are displayed)  Labs Reviewed  CBC WITH DIFFERENTIAL/PLATELET  COMPREHENSIVE METABOLIC PANEL  TROPONIN I  TYPE AND SCREEN  PREPARE RBC  (CROSSMATCH)  PREPARE PLATELET PHERESIS  ABO/RH   ____________________________________________  EKG  ED ECG REPORT I, Rudene Re, the attending physician, personally viewed and interpreted this ECG.  Normal sinus rhythm, rate of 80, normal intervals, left axis deviation, low voltage QRS, diffuse T-wave flattening, no ST elevations or depressions. New from prior ____________________________________________  RADIOLOGY  A & O x3, PERRL, EOMI, no nystagmus, CN II-XII intact, motor testing reveals good tone and bulk throughout. There is no evidence of pronator drift or dysmetria. Muscle strength is 5/5 throughout. Deep tendon reflexes are 2+ throughout with downgoing toes. Sensory examination is intact. Gait is normal.    ____________________________________________   PROCEDURES  Procedure(s) performed: None Procedures Critical Care performed:  None ____________________________________________   INITIAL IMPRESSION / ASSESSMENT AND PLAN / ED COURSE   67 y.o. 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 labs done at the cancer center today showing platelets of 14, hemoglobin of 6.7 which are acute when compared to labs from 10 days ago which showed platelets of 113 and hemoglobin of 7.8. Patient has an [place] of 700. She is neurologically intact. CT head showing no further progression of Chiari I malformation. Unfortunately patient's neurosurgeon Dr. Aris Lot is on vacation. I spoke with Dr. Izora Ribas who recommended no surgical intervention and admission here for management of pancytopenia since patient's oncologist is here. He will follow patient in house. Discussed with Dr. Grayland Ormond, patient's oncologist who recommended a blood transfusion but recommended holding on platelet transfusion at this time unless surgeries planned. I have ordered  platelets to be prepared in case patient needs them since they have to come from [facility] but they are not to be transfused unless ordered by inpatient team. Patient with no active bleeding. No signs or symptoms of sepsis/ infection. Discussed with the Hospitalist for admission.      As part of my medical decision making, I reviewed the following data within the [place] notes reviewed and incorporated, Labs reviewed , EKG interpreted , Old EKG reviewed, Old chart reviewed, Radiograph reviewed , Discussed with admitting physician , A consult was requested and obtained from this/these consultant(s) NSG, oncology, Notes from prior ED visits and [place] Controlled Substance Database    Pertinent labs & imaging results that were available during my care of the patient were reviewed by me and considered in my medical decision making (see  chart for details).    ____________________________________________   FINAL CLINICAL IMPRESSION(S) / ED DIAGNOSES  Final diagnoses:  Pancytopenia due to chemotherapy ([place])  Chiari I malformation ([place])  Dizziness      NEW MEDICATIONS STARTED DURING THIS VISIT:  ED Discharge Orders    None       Note:  This document was prepared using Dragon voice recognition software and may include unintentional dictation errors.    Rudene Re, MD 02/15/18 [phone]

## 2018-02-15 NOTE — Progress Notes (Signed)
Patient here today for add on in symptom management regarding dizziness and changes in vision. Patient and daughter report symptoms started about 1 week ago but have worsened the last 2 days. Patient also reports worsening pain, shortness of breath, difficulty swallowing.

## 2018-02-15 NOTE — ED Triage Notes (Signed)
Pt was at cancer center today and was given concerns over abnormal blood work and told to come to the ED for further testing and admission to the hospital. Pt complaints of persistent dizziness and left sided weakness for last month that has become worse for the last few days.

## 2018-02-15 NOTE — Telephone Encounter (Signed)
Please see [facility] today.

## 2018-02-15 NOTE — Progress Notes (Signed)
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 Grayland Ormond 02/05/2018 for cycle 3 day 8 of carboplatin and gemcitabine. She received only gemcitabine. At that visit, she continued to complain of persistent dizziness and peripheral neuropathy. Patient had initial treatment with chemotherapy and radiation on 03/07/2016 and then had 2 cycles of consolidation carbo/Taxol completing on 04/27/2016. CT scans from May 2018 revealed progression of disease. Subsequently,  she received radiation and Opdivo every 2 weeks. She completed a total of 13 infusions of Opdivo on 11/06/2017. PET scan from December 2018 revealed progression of disease. Began carbo/gemcitabine on 12/11/2017.  Interval history-  Susan Willis presents with progressive dizziness and weakness. The dizziness has been present for 1 week.  She describes the symptoms as near syncope.  Symptoms are exacerbated by rising from squatting or sitting position, bending and motion  She also complains of mild shortness of breath, right sided abdominal pain, confusion/disorientation, mild dysphagia and worsening peripheral neurpathy. Patient denies chest pain, headache or facial drooping. .   She has been treated with nothing with no improvement.  Previous work up has been MRI of brain from May  2018 showing slight progression of Chiari 1 malformation from her previous scan in 2017. A neurosurgeon at [facility] was consulted.  ECOG FS:2 - Symptomatic, <50% confined to bed  Review of systems- Review of Systems  Constitutional: Positive for malaise/fatigue. Negative for chills, fever and weight loss.  HENT: Negative for congestion, ear pain and sinus pain.   Eyes: Negative.  Negative for blurred vision, double vision, pain, discharge and redness.  Respiratory: Positive for shortness of breath. Negative for cough, sputum production and wheezing.   Cardiovascular: Negative.  Negative for chest pain, palpitations and leg swelling.  Gastrointestinal: Positive for constipation (mild) and nausea. Negative for abdominal pain, diarrhea and vomiting.  Genitourinary: Negative for dysuria, frequency, hematuria and urgency.  Musculoskeletal: Negative for back pain and falls.  Skin: Negative.  Negative for rash.  Neurological: Positive for dizziness, tingling, sensory change (peripheral neuropathy in bilateral hands and feet) and weakness. Negative for tremors, seizures and headaches.  Endo/Heme/Allergies: Negative.  Does not bruise/bleed easily.  Psychiatric/Behavioral: Negative.  Negative for depression. The patient is not nervous/anxious and does not have insomnia.      Current treatment- gemcitabine only  No Known Allergies   Past Medical History:  Diagnosis Date  . Arthritis   . Blood dyscrasia   . Cancer of left lung ([place]) 08/2015   Rad + chemo tx's.  . Diabetes mellitus without complication ([place])   . Diabetic neuropathy ([place])   . High cholesterol   . Hypertension   . Insulin dependent diabetes mellitus ([place])      Past Surgical History:  Procedure Laterality Date  . ABDOMINAL HYSTERECTOMY    . CESAREAN SECTION    . ELECTROMAGNETIC NAVIGATION BROCHOSCOPY N/A 11/26/2015   Procedure: ELECTROMAGNETIC NAVIGATION BRONCHOSCOPY;  Surgeon: Maretta Bees  Mortimer Fries, MD;  Location: [facility] ORS;  Service:  Cardiopulmonary;  Laterality: N/A;  . ENDOBRONCHIAL ULTRASOUND N/A 11/26/2015   Procedure: ENDOBRONCHIAL ULTRASOUND;  Surgeon: Flora Lipps, MD;  Location: [facility] ORS;  Service: Cardiopulmonary;  Laterality: N/A;  . PERIPHERAL VASCULAR CATHETERIZATION N/A 12/09/2015   Procedure: Glori Luis Cath Insertion;  Surgeon: Katha Cabal, MD;  Location: [place] [facility];  Service: Cardiovascular;  Laterality: N/A;    Social History   Socioeconomic History  . Marital status: Widowed    Spouse name: Not on file  . Number of children: Not on file  . Years of education: Not on file  . Highest education level: Not on file  Social Needs  . Financial resource strain: Not on file  . Food insecurity - worry: Not on file  . Food insecurity - inability: Not on file  . Transportation needs - medical: Not on file  . Transportation needs - non-medical: Not on file  Occupational History  . Not on file  Tobacco Use  . Smoking status: Current Every Day Smoker    Packs/day: 0.30    Years: 35.00    Pack years: 10.50    Types: Cigarettes  . Smokeless tobacco: Never Used  . Tobacco comment: workiing on quitting, down to 2 packs per week now  Substance and Sexual Activity  . Alcohol use: No    Alcohol/week: 0.0 oz  . Drug use: No  . Sexual activity: Not on file  Other Topics Concern  . Not on file  Social History Narrative  . Not on file    No family history on file.   Current Outpatient Medications:  .  atorvastatin (LIPITOR) 20 MG tablet, Take 20 mg by mouth daily at 6 PM., Disp: , Rfl:  .  levothyroxine (SYNTHROID, LEVOTHROID) 150 MCG tablet, Take 200 mcg by mouth daily before breakfast. , Disp: , Rfl:  .  lidocaine-prilocaine (EMLA) cream, Apply to affected area once, Disp: 30 g, Rfl: 6 .  magic mouthwash w/lidocaine SOLN, Take 5 mLs by mouth 4 (four) times daily., Disp: 240 mL, Rfl: 1 .  oxyCODONE (OXY IR/ROXICODONE) 5 MG immediate release tablet, Take 1 tablet (5 mg total) by mouth every 6  (six) hours as needed for severe pain., Disp: 30 tablet, Rfl: 0 .  albuterol (PROVENTIL HFA;VENTOLIN HFA) 108 (90 Base) MCG/ACT inhaler, Inhale 2 puffs into the lungs every 6 (six) hours as needed for wheezing or shortness of breath. (Patient not taking: Reported on 01/29/2018), Disp: 1 Inhaler, Rfl: 2 .  gabapentin (NEURONTIN) 300 MG capsule, Take 1 capsule (300 mg total) by mouth at bedtime. (Patient not taking: Reported on 02/05/2018), Disp: 30 capsule, Rfl: 2 .  insulin glargine (LANTUS) 100 UNIT/ML injection, Inject 0.52 mLs (52 Units total) into the skin daily., Disp: 10 mL, Rfl: 11 .  polyethylene glycol (MIRALAX / GLYCOLAX) packet, Take 17 g by mouth daily as needed for mild constipation., Disp: 30 each, Rfl: 0 .  prochlorperazine (COMPAZINE) 10 MG tablet, Take 1 tablet (10 mg total) by mouth every 6 (six) hours as needed (Nausea or vomiting)., Disp: 60 tablet, Rfl: 2  Physical exam:  Vitals:   02/15/18 1218  BP: 123/73  Pulse: 86  Resp: 20  Temp: 98 F (36.7 C)  TempSrc: Tympanic   Physical Exam  Constitutional: She is oriented to person, place, and time and well-developed, well-nourished, and in no distress. Vital signs are normal.  HENT:  Head: Normocephalic and atraumatic.  Eyes: Conjunctivae and EOM  are normal. Pupils are equal, round, and reactive to light. Right eye exhibits no chemosis. Left eye exhibits no chemosis.  Bilateral eyelids appear swollen/puffy  Neck: Normal range of motion and full passive range of motion without pain. Neck supple.  Cardiovascular: Normal rate, regular rhythm and normal heart sounds.  No murmur heard. Pulmonary/Chest: Effort normal and breath sounds normal. She has no wheezes.  Abdominal: Soft. Normal appearance and bowel sounds are normal. She exhibits no distension and no mass. There is tenderness (right upper quadrant) in the right upper quadrant. There is no rebound and no guarding.  Musculoskeletal: Normal range of motion. She exhibits edema  (mild pedal edema).  Lymphadenopathy:    She has no cervical adenopathy.    She has no axillary adenopathy.  Neurological: She is alert and oriented to person, place, and time. She has intact cranial nerves. She displays weakness. She displays facial symmetry and normal speech. Gait normal.  Left arm slightly stronger then right when grips tested  Skin: Skin is warm, dry and intact. No rash noted.  Psychiatric: Mood, memory, affect and judgment normal.     CMP Latest Ref Rng & Units 02/16/2018  Glucose 65 - 99 mg/dL 231(H)  BUN 6 - 20 mg/dL 12  Creatinine 0.44 - 1.00 mg/dL 1.17(H)  Sodium 135 - 145 mmol/L 140  Potassium 3.5 - 5.1 mmol/L 3.9  Chloride 101 - 111 mmol/L 104  CO2 22 - 32 mmol/L 25  Calcium 8.9 - 10.3 mg/dL 9.0  Total Protein 6.5 - 8.1 g/dL -  Total Bilirubin 0.3 - 1.2 mg/dL -  Alkaline Phos 38 - 126 U/L -  AST 15 - 41 U/L -  ALT 14 - 54 U/L -   CBC Latest Ref Rng & Units 02/17/2018  WBC 3.6 - 11.0 K/uL 1.5(L)  Hemoglobin 12.0 - 16.0 g/dL 9.2(L)  Hematocrit 35.0 - 47.0 % 27.6(L)  Platelets 150 - 440 K/uL 46(L)    No images are attached to the encounter.  Ct Head Wo Contrast  Result Date: 02/15/2018 CLINICAL DATA:  Dizziness and left-sided weakness. History of lung carcinoma EXAM: CT HEAD WITHOUT CONTRAST TECHNIQUE: Contiguous axial images were obtained from the base of the skull through the vertex without intravenous contrast. COMPARISON:  Brain MRI Apr 27, 2017 FINDINGS: Brain: The ventricles are normal in size and configuration. 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 There is no intracranial mass, hemorrhage, extra-axial fluid collection, or midline shift. There is mild small vessel disease in the centra semiovale bilaterally. Elsewhere gray-white compartments appear normal. No acute infarct evident. Vascular: No hyperdense vessel. There is calcification in each  carotid siphon region. Skull: Bony calvarium appears intact. Sinuses/Orbits: There is mucosal thickening in several ethmoid air cells. There is a benign osteoma at the right frontal-right ethmoid junction measuring 1.3 x 1.2 cm. Other visualized paranasal sinuses are clear. Visualized orbits appear symmetric bilaterally. Other: Mastoid air cells are clear. IMPRESSION: 1. Chiari I malformation. Note that the cerebellar tonsils impress upon the upper cervical cord. This finding may have clinical significance and warrants clinical assessment in this regard. 2. Mild periventricular small vessel disease. No acute infarct. No mass or hemorrhage. 3.  There are foci of arterial vascular calcification. 4.  There is mucosal thickening in several ethmoid air cells. Electronically Signed   By: Lowella Grip III M.D.   On: 02/15/2018 14:07     Assessment  and plan- Patient is a 67 y.o. female who presents to symptom management for progressive weakness and dizziness. New onset confusion.  1. History of stage IIIa squamous cell carcinoma of the upper lobe of left lung: Patient currently status post cycle 3 day 8 of dose reduced gemcitabine due to neutropenia and thrombocytopenia. Counts at that time were trending down: hemoglobin 7.9, platelets count of 113 and WBC 1.6. We proceeded with chemotherapy and she was scheduled to return in 2 weeks prior to cycle 4 day 1 with labs and to see Dr. Grayland Ormond. Re-imaging was scheduled at the completion of cycle 4.   2. Dizziness/confusion/weakness: Patient has history of Chiari I malformation and is being seen by a [facility] neurosurgeon, Dr. Aris Lot. At last visit, on 01/30/2018 she was recommended for surgical intervention but with her known metastatic disease and recent progression they did not think this was a feasible intervention at this time. It was recommended that she have an up-to-date MRI completed ASAP. Dr. Aris Lot was to speak with Dr. Grayland Ormond about the possibility of  interrupting chemotherapy to have surgery should she become a candidate. During that, visit she also complained of dizziness, and headaches and positional dysphagia. I do not see where a repeat MRI was ever completed.  Today's labs indicate severe anemia and thrombocytopenia  (Hemoglobin 6.7 and platelet count 14). Patient is symptomatic with shortness of breath, dizziness, weakness and fatigue. Neurologically patient is intact. Thorough neurological exam performed. Given her history, I would feel more comfortable if she was evaluated in the emergency room. She will likely need a stat CT and MRI of head to rule out stroke, aneurysm, malignancy or progression of Chiari I malformation. She also will likely need several units of packed red blood cells, platelets and close monitoring given her history and current/recent chemotherapy treatment. Patient and family in agreement with plan. Primary oncologist, Dr. Grayland Ormond consulted and also in agreement with plan. Patient is escorted by Tillie Rung, RN to emergency room. Report called to charge nurse.   Visit Diagnosis 1. Malignant neoplasm of upper lobe of left lung ([place])   2. Dizziness   3. Weakness   4. Confusion and disorientation     Patient expressed understanding and was in agreement with this plan. She also understands that She can call clinic at any time with any questions, concerns, or complaints.   Greater than 50% was spent in counseling and coordination of care with this patient including but not limited to discussion of the relevant topics above (See A&P) including, but not limited to diagnosis and management of acute and chronic medical conditions.    Faythe Casa, AGNP-C [facility] [place]- [phone] Pager- [phone] 02/19/2018 7:46 AM

## 2018-02-15 NOTE — Telephone Encounter (Signed)
Patients daughter called to report that she was worried about her mothers condition. She reports that her mother is lethargic, her face is swollen and her eyes "dont look right.

## 2018-02-15 NOTE — Progress Notes (Signed)
Chaplain met with patient and daughter Colletta [place], offering prayer and spiritual support. The OR has been completed.

## 2018-02-15 NOTE — H&P (Addendum)
[facility] NAME: Susan Willis    MR#:  [id number]  DATE OF BIRTH:  1951/01/10  DATE OF ADMISSION:  02/15/2018  PRIMARY CARE PHYSICIAN: Marguerita Merles, MD   REQUESTING/REFERRING PHYSICIAN: Dr. Alfred Levins  CHIEF COMPLAINT:   Chief Complaint  Patient presents with  . abnormal labs    HISTORY OF PRESENT ILLNESS:  Susan Willis  is a 67 y.o. female with a known history of hypertension, diabetes, stage III lung cancer with recent chemotherapy on 02/05/2018 presents to the emergency room sent in after she was found to be anemic and thrombocytopenic at the cancer center.  Patient has been feeling weak, dizzy.  She also complains of subjective left-sided weakness.  Has been ablating with a walker for 2 weeks. Here she has been found to have hemoglobin 6.7, platelets of 14.  No bleeding or bruising.  CT scan of the head showed no acute stroke.  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  Discussed with Dr. Alfred Levins  PAST MEDICAL HISTORY:   Past Medical History:  Diagnosis Date  . Arthritis   . Blood dyscrasia   . Cancer of left lung ([place]) 08/2015   Rad + chemo tx's.  . Diabetes mellitus without complication ([place])   . Diabetic neuropathy ([place])   . High cholesterol   . Hypertension   . Insulin dependent diabetes mellitus ([place])     PAST SURGICAL HISTORY:   Past Surgical History:  Procedure Laterality Date  . ABDOMINAL HYSTERECTOMY    . CESAREAN SECTION    . ELECTROMAGNETIC NAVIGATION BROCHOSCOPY N/A 11/26/2015   Procedure: ELECTROMAGNETIC NAVIGATION BRONCHOSCOPY;  Surgeon: Flora Lipps, MD;  Location: [facility] ORS;  Service: Cardiopulmonary;  Laterality: N/A;  . ENDOBRONCHIAL ULTRASOUND N/A 11/26/2015   Procedure: ENDOBRONCHIAL ULTRASOUND;  Surgeon: Flora Lipps, MD;  Location: [facility] ORS;  Service: Cardiopulmonary;  Laterality: N/A;  . PERIPHERAL VASCULAR CATHETERIZATION N/A  12/09/2015   Procedure: Glori Luis Cath Insertion;  Surgeon: Katha Cabal, MD;  Location: [place] [facility];  Service: Cardiovascular;  Laterality: N/A;    SOCIAL HISTORY:   Social History   Tobacco Use  . Smoking status: Current Every Day Smoker    Packs/day: 0.30    Years: 35.00    Pack years: 10.50    Types: Cigarettes  . Smokeless tobacco: Never Used  . Tobacco comment: workiing on quitting, down to 2 packs per week now  Substance Use Topics  . Alcohol use: No    Alcohol/week: 0.0 oz    FAMILY HISTORY:  No family history on file. No history of lung cancer  DRUG ALLERGIES:  No Known Allergies  REVIEW OF SYSTEMS:   Review of Systems  Constitutional: Positive for malaise/fatigue. Negative for chills and fever.  HENT: Negative for sore throat.   Eyes: Negative for blurred vision, double vision and pain.  Respiratory: Negative for cough, hemoptysis, shortness of breath and wheezing.   Cardiovascular: Negative for chest pain, palpitations, orthopnea and leg swelling.  Gastrointestinal: Negative for abdominal pain, constipation, diarrhea, heartburn, nausea and vomiting.  Genitourinary: Negative for dysuria and hematuria.  Musculoskeletal: Positive for myalgias. Negative for back pain and joint pain.  Skin: Negative for rash.  Neurological: Positive for weakness. Negative for sensory change, speech change, focal weakness and headaches.  Endo/Heme/Allergies: Does not bruise/bleed easily.  Psychiatric/Behavioral: Negative for depression. The patient is not nervous/anxious.     MEDICATIONS  AT HOME:   Prior to Admission medications   Medication Sig Start Date End Date Taking? Authorizing Provider  insulin glargine (LANTUS) 100 UNIT/ML injection Inject 60 Units into the skin daily.    Yes [provider]  lidocaine-prilocaine (EMLA) cream Apply to affected area once 07/17/17  Yes Verlon Au, NP  magic mouthwash w/lidocaine SOLN Take 5 mLs by mouth 4 (four)  times daily. 01/15/18  Yes [name], MD  oxyCODONE (OXY IR/ROXICODONE) 5 MG immediate release tablet Take 1 tablet (5 mg total) by mouth every 6 (six) hours as needed for severe pain. 01/30/18  Yes [name], MD  prochlorperazine (COMPAZINE) 10 MG tablet Take 1 tablet (10 mg total) by mouth every 6 (six) hours as needed (Nausea or vomiting). 12/04/17  Yes [name], MD  albuterol (PROVENTIL HFA;VENTOLIN HFA) 108 (90 Base) MCG/ACT inhaler Inhale 2 puffs into the lungs every 6 (six) hours as needed for wheezing or shortness of breath. Patient not taking: Reported on 01/29/2018 07/17/17   Verlon Au, NP  atorvastatin (LIPITOR) 20 MG tablet Take 20 mg by mouth daily at 6 PM.    [provider]  gabapentin (NEURONTIN) 300 MG capsule Take 1 capsule (300 mg total) by mouth at bedtime. Patient not taking: Reported on 02/05/2018 12/11/17   [name], MD  glimepiride (AMARYL) 4 MG tablet Take 4 mg by mouth daily with breakfast.  10/24/17 10/24/18  [provider]  levothyroxine (SYNTHROID, LEVOTHROID) 150 MCG tablet Take 200 mcg by mouth daily before breakfast.  10/23/17   [provider]  linagliptin (TRADJENTA) 5 MG TABS tablet Take 5 mg by mouth daily.     [provider]  lisinopril (PRINIVIL,ZESTRIL) 10 MG tablet Take 10 mg by mouth daily.    [provider]  ondansetron (ZOFRAN) 8 MG tablet Take 1 tablet (8 mg total) by mouth 2 (two) times daily as needed for refractory nausea / vomiting. Patient not taking: Reported on 02/15/2018 12/04/17   [name], MD     VITAL SIGNS:  Blood pressure (!) 90/54, pulse 80, temperature 98.3 F (36.8 C), temperature source Oral, resp. rate 20, height 5\' 4"  (1.626 m), weight 118.4 kg (261 lb), SpO2 99 %.  PHYSICAL EXAMINATION:  Physical Exam  GENERAL:  67 y.o.-year-old patient lying in the bed with no acute distress.  Obese EYES: Pupils equal, round, reactive to light and  accommodation. No scleral icterus. Extraocular muscles intact.  HEENT: Head atraumatic, normocephalic. Oropharynx and nasopharynx clear. No oropharyngeal erythema, moist oral mucosa  NECK:  Supple, no jugular venous distention. No thyroid enlargement, no tenderness.  LUNGS: Normal breath sounds bilaterally, no wheezing, rales, rhonchi. No use of accessory muscles of respiration.  CARDIOVASCULAR: S1, S2 normal. No murmurs, rubs, or gallops.  ABDOMEN: Soft, nontender, nondistended. Bowel sounds present. No organomegaly or mass.  EXTREMITIES: No pedal edema, cyanosis, or clubbing. + 2 pedal & radial pulses b/l.   NEUROLOGIC: Cranial nerves II through XII are intact. No focal Motor or sensory deficits appreciated b/l PSYCHIATRIC: The patient is alert and oriented x 3. Good affect.  SKIN: No obvious rash, lesion, or ulcer.   LABORATORY PANEL:   CBC Recent Labs  Lab 02/15/18 1411  WBC 1.4*  HGB 7.0*  HCT 21.0*  PLT 14*   ------------------------------------------------------------------------------------------------------------------  Chemistries  Recent Labs  Lab 02/15/18 1411  NA 138  K 4.0  CL 105  CO2 24  GLUCOSE 171*  BUN 13  CREATININE 1.08*  CALCIUM 9.0  AST 30  ALT 18  ALKPHOS 68  BILITOT 0.5   ------------------------------------------------------------------------------------------------------------------  Cardiac Enzymes Recent Labs  Lab 02/15/18 1411  TROPONINI <0.03   ------------------------------------------------------------------------------------------------------------------  RADIOLOGY:  Ct Head Wo Contrast  Result Date: 02/15/2018 CLINICAL DATA:  Dizziness and left-sided weakness. History of lung carcinoma EXAM: CT HEAD WITHOUT CONTRAST TECHNIQUE: Contiguous axial images were obtained from the base of the skull through the vertex without intravenous contrast. COMPARISON:  Brain MRI Apr 27, 2017 FINDINGS: Brain: The ventricles are normal in size and  configuration. 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 There is no intracranial mass, hemorrhage, extra-axial fluid collection, or midline shift. There is mild small vessel disease in the centra semiovale bilaterally. Elsewhere gray-white compartments appear normal. No acute infarct evident. Vascular: No hyperdense vessel. There is calcification in each carotid siphon region. Skull: Bony calvarium appears intact. Sinuses/Orbits: There is mucosal thickening in several ethmoid air cells. There is a benign osteoma at the right frontal-right ethmoid junction measuring 1.3 x 1.2 cm. Other visualized paranasal sinuses are clear. Visualized orbits appear symmetric bilaterally. Other: Mastoid air cells are clear. IMPRESSION: 1. Chiari I malformation. Note that the cerebellar tonsils impress upon the upper cervical cord. This finding may have clinical significance and warrants clinical assessment in this regard. 2. Mild periventricular small vessel disease. No acute infarct. No mass or hemorrhage. 3.  There are foci of arterial vascular calcification. 4.  There is mucosal thickening in several ethmoid air cells. Electronically Signed   By: Lowella Grip III M.D.   On: 02/15/2018 14:07     IMPRESSION AND PLAN:   * Pancytopenia Secondary to chemotherapy. We will transfuse 1 unit packed RBC.  No need for platelet transfusion with no bleeding at this time.  Will transfuse if platelets are less than 10,000.  We will check a PT, APTT and fibrinogen.  Consult oncology for further input. Monitor for bleeding.  High risk for neutropenia.  *Stage III lung cancer on chemotherapy.  Oncology to see patient.  *Hypertension.  Hold medications as blood pressure is low normal at this time.  *Insulin-dependent diabetes mellitus.  We will continue lower dose of Lantus from home.  Hold glipizide and liraglutide. Sliding  scale insulin.  Diabetic diet.  *Chiari I malformation.  Similar findings on CT scan compared to MRI in 2018 per neurosurgery.  No focal deficits at this time.  Neurosurgery to see patient in the morning. Case discussed from emergency room.  DVT prophylaxis with teds.  All the records are reviewed and case discussed with ED provider. Management plans discussed with the patient, family and they are in agreement.  CODE STATUS: DNR  TOTAL TIME TAKING CARE OF THIS PATIENT: 30 minutes.   Neita Carp M.D on 02/15/2018 at 3:13 PM  Between 7am to 6pm - Pager - [phone]    Advance care planning  Discussed with patient regarding her stage III lung cancer.  She tells me recent new spot was found on the lung and her chemotherapy has been changed.  She is hoping with the new chemotherapy she would see improvement in the cancer.  Has follow-up with Dr. Grayland Ormond as outpatient.  He tells me her healthcare power of attorney is her daughter sitting at the bedside. Discussed regarding her pancytopenia, lung cancer and side effects of chemotherapy.  Expected course and prognosis.  She has discussed  previously with her physician regarding CODE STATUS and make decisions regarding DO NOT RESUSCITATE and DO NOT INTUBATE.  We discussed regarding DNR and continued aggressive treatment at this time.  Time spent on advanced care planning 20 minutes.    After 6pm go to [email or web address] - password [email or web address] [facility]  [facility] [place] [facility]  Office  [phone]  CC: Primary care physician; Marguerita Merles, MD  Note: This dictation was prepared with Dragon dictation along with smaller phrase technology. Any transcriptional errors that result from this process are unintentional.

## 2018-02-15 NOTE — Telephone Encounter (Signed)
Scheduled with Beckey Rutter NP

## 2018-02-16 DIAGNOSIS — D61810 Antineoplastic chemotherapy induced pancytopenia: Principal | ICD-10-CM

## 2018-02-16 DIAGNOSIS — R5383 Other fatigue: Secondary | ICD-10-CM

## 2018-02-16 DIAGNOSIS — R9402 Abnormal brain scan: Secondary | ICD-10-CM

## 2018-02-16 DIAGNOSIS — R531 Weakness: Secondary | ICD-10-CM

## 2018-02-16 DIAGNOSIS — C3490 Malignant neoplasm of unspecified part of unspecified bronchus or lung: Secondary | ICD-10-CM

## 2018-02-16 LAB — CBC
HCT: 22.8 % — ABNORMAL LOW (ref 35.0–47.0)
HEMOGLOBIN: 7.8 g/dL — AB (ref 12.0–16.0)
MCH: 31.2 pg (ref 26.0–34.0)
MCHC: 34.2 g/dL (ref 32.0–36.0)
MCV: 91.4 fL (ref 80.0–100.0)
Platelets: 20 10*3/uL (ref 150–440)
RBC: 2.49 MIL/uL — ABNORMAL LOW (ref 3.80–5.20)
RDW: 16.8 % — ABNORMAL HIGH (ref 11.5–14.5)
WBC: 1.5 10*3/uL — ABNORMAL LOW (ref 3.6–11.0)

## 2018-02-16 LAB — PREPARE PLATELET PHERESIS
UNIT DIVISION: 0
Unit division: 0
Unit division: 0
Unit division: 0

## 2018-02-16 LAB — BASIC METABOLIC PANEL
ANION GAP: 11 (ref 5–15)
BUN: 12 mg/dL (ref 6–20)
CALCIUM: 9 mg/dL (ref 8.9–10.3)
CO2: 25 mmol/L (ref 22–32)
CREATININE: 1.17 mg/dL — AB (ref 0.44–1.00)
Chloride: 104 mmol/L (ref 101–111)
GFR calc Af Amer: 54 mL/min — ABNORMAL LOW (ref >60)
GFR calc non Af Amer: 47 mL/min — ABNORMAL LOW (ref >60)
GLUCOSE: 231 mg/dL — AB (ref 65–99)
Potassium: 3.9 mmol/L (ref 3.5–5.1)
Sodium: 140 mmol/L (ref 135–145)

## 2018-02-16 LAB — BPAM PLATELET PHERESIS
Blood Product Expiration Date: 201903162359
Blood Product Expiration Date: 201903162359
Blood Product Expiration Date: 201903162359
Blood Product Expiration Date: 201903162359
ISSUE DATE / TIME: 201903150419
ISSUE DATE / TIME: 201903150332
UNIT TYPE AND RH: 600
Unit Type and Rh: 5100
Unit Type and Rh: 5100
Unit Type and Rh: 6200

## 2018-02-16 LAB — GLUCOSE, CAPILLARY
GLUCOSE-CAPILLARY: 182 mg/dL — AB (ref 65–99)
GLUCOSE-CAPILLARY: 176 mg/dL — AB (ref 65–99)
GLUCOSE-CAPILLARY: 118 mg/dL — AB (ref 65–99)
Glucose-Capillary: 163 mg/dL — ABNORMAL HIGH (ref 65–99)

## 2018-02-16 LAB — PREPARE RBC (CROSSMATCH)

## 2018-02-16 MED ORDER — SODIUM CHLORIDE 0.9 % IV SOLN
Freq: Once | INTRAVENOUS | Status: AC
  Administered 2018-02-16: 15:00:00 via INTRAVENOUS

## 2018-02-16 MED ORDER — SODIUM CHLORIDE 0.9% FLUSH: 10.0000 mL | INTRAVENOUS | Status: DC | PRN

## 2018-02-16 NOTE — Consult Note (Addendum)
Hematology/Oncology Consult note [facility] [phone] [phone]  [name], [name] [name], [name] [name], [name] [name], [name], [name], [name]   Name of the patient: Susan Willis  [id number]  Oct 04, 1951    Reason for referral- metastatic lung cancer on chemotherapy   Requesting physician: Dr. Darvin Neighbours  Date of visit: 02/16/2018    History of presenting illness-patient is a 67 year old female with a history of stage III lung cancer that was treated with concurrent chemoradiation in April 2017 followed by 2 cycles of consolidation chemo.  She had disease progression in May 2018 and received opdivo every 2 weeks.  She then had disease progression in December 2018 and was switched to carboplatin and gemcitabine in January 2019.  Her last dose of single agent gemcitabine was on 02/05/2018.  She did not receive Neulasta given her weekly regimen.  She was admitted to the hospital for generalized weakness, subjective left-sided weakness and pancytopenia.  She had CT head which did not show any acute stroke but was noted to have Chiari I malformation.  Neurosurgery was consulted for the same and they will follow her as an outpatient.  She has received 1 unit of PRBC since admission and hemoglobin has improved to 7.8 today.  Also platelet count improved from 14-20 today.  She still feels fatigued but better than yesterday. She did ambulate to the bathroom. Denies any cough, burning urination or fever  ECOG PS- 1  Pain scale- 0   Review of systems- Review of Systems  Constitutional: Positive for malaise/fatigue. Negative for chills, fever and weight loss.  HENT: Negative for congestion, ear discharge and nosebleeds.   Eyes: Negative  for blurred vision.  Respiratory: Negative for cough, hemoptysis, sputum production, shortness of breath and wheezing.   Cardiovascular: Negative for chest pain, palpitations, orthopnea and claudication.  Gastrointestinal: Negative for abdominal pain, blood in stool, constipation, diarrhea, heartburn, melena, nausea and vomiting.  Genitourinary: Negative for dysuria, flank pain, frequency, hematuria and urgency.  Musculoskeletal: Negative for back pain, joint pain and myalgias.  Skin: Negative for rash.  Neurological: Positive for weakness. Negative for dizziness, tingling, focal weakness, seizures and headaches.  Endo/Heme/Allergies: Does not bruise/bleed easily.  Psychiatric/Behavioral: Negative for depression and suicidal ideas. The patient does not have insomnia.     No Known Allergies  Patient Active Problem List   Diagnosis Date Noted  . Pancytopenia ([place]) 02/15/2018  . Hypothyroidism (acquired) 10/19/2017  . Coronary artery disease 07/03/2017  . Diabetes mellitus type 2, uncomplicated ([place]) 25/95/6387  . Mixed hyperlipidemia 07/03/2017  . PVD (peripheral vascular disease) ([place]) 07/03/2017  . Malignant neoplasm of upper lobe of left lung ([place]) 12/07/2015  . Dizziness 06/27/2014  . Vulvar abscess 05/05/2014  . Depressive disorder 09/28/2012  . Generalized osteoarthrosis 09/28/2012  . Essential hypertension 09/24/2012  . Malignant neoplasm of corpus uteri ([place]) 09/24/2012  . Type II diabetes mellitus ([place]) 09/24/2012     Past Medical History:  Diagnosis Date  . Arthritis   . Blood dyscrasia   . Cancer of left lung ([place]) 08/2015   Rad + chemo tx's.  . Diabetes mellitus without complication ([place])   . Diabetic neuropathy ([place])   . High cholesterol   . Hypertension   . Insulin dependent diabetes mellitus [facility])      Past Surgical  History:  Procedure Laterality Date  . ABDOMINAL HYSTERECTOMY    . CESAREAN SECTION    . ELECTROMAGNETIC NAVIGATION BROCHOSCOPY N/A  11/26/2015   Procedure: ELECTROMAGNETIC NAVIGATION BRONCHOSCOPY;  Surgeon: Flora Lipps, MD;  Location: [facility] ORS;  Service: Cardiopulmonary;  Laterality: N/A;  . ENDOBRONCHIAL ULTRASOUND N/A 11/26/2015   Procedure: ENDOBRONCHIAL ULTRASOUND;  Surgeon: Flora Lipps, MD;  Location: [facility] ORS;  Service: Cardiopulmonary;  Laterality: N/A;  . PERIPHERAL VASCULAR CATHETERIZATION N/A 12/09/2015   Procedure: Glori Luis Cath Insertion;  Surgeon: Katha Cabal, MD;  Location: [place] [facility];  Service: Cardiovascular;  Laterality: N/A;    Social History   Socioeconomic History  . Marital status: Widowed    Spouse name: Not on file  . Number of children: Not on file  . Years of education: Not on file  . Highest education level: Not on file  Social Needs  . Financial resource strain: Not on file  . Food insecurity - worry: Not on file  . Food insecurity - inability: Not on file  . Transportation needs - medical: Not on file  . Transportation needs - non-medical: Not on file  Occupational History  . Not on file  Tobacco Use  . Smoking status: Current Every Day Smoker    Packs/day: 0.30    Years: 35.00    Pack years: 10.50    Types: Cigarettes  . Smokeless tobacco: Never Used  . Tobacco comment: workiing on quitting, down to 2 packs per week now  Substance and Sexual Activity  . Alcohol use: No    Alcohol/week: 0.0 oz  . Drug use: No  . Sexual activity: Not on file  Other Topics Concern  . Not on file  Social History Narrative  . Not on file     No family history on file.   Current Facility-Administered Medications:  .  0.9 %  sodium chloride infusion, 10 mL/hr, Intravenous, Once, Alfred Levins, [place], MD .  acetaminophen (TYLENOL) tablet 650 mg, 650 mg, Oral, Q6H PRN **OR** acetaminophen (TYLENOL) suppository 650 mg, 650 mg, Rectal, Q6H PRN, Sudini, Srikar, MD .  albuterol (PROVENTIL) (2.5 MG/3ML) 0.083% nebulizer solution 2.5 mg, 2.5 mg, Nebulization, Q2H PRN, Sudini, Srikar, MD .   atorvastatin (LIPITOR) tablet 20 mg, 20 mg, Oral, q1800, Sudini, Srikar, MD, 20 mg at 02/15/18 1717 .  insulin aspart (novoLOG) injection 0-15 Units, 0-15 Units, Subcutaneous, TID WC, Hillary Bow, MD, 3 Units at 02/16/18 1148 .  insulin aspart (novoLOG) injection 0-5 Units, 0-5 Units, Subcutaneous, QHS, Sudini, Srikar, MD .  insulin glargine (LANTUS) injection 52 Units, 52 Units, Subcutaneous, Daily, Hillary Bow, MD, 52 Units at 02/16/18 0813 .  levothyroxine (SYNTHROID, LEVOTHROID) tablet 200 mcg, 200 mcg, Oral, Q0600, Hillary Bow, MD, 200 mcg at 02/16/18 0541 .  ondansetron (ZOFRAN) tablet 4 mg, 4 mg, Oral, Q6H PRN **OR** ondansetron (ZOFRAN) injection 4 mg, 4 mg, Intravenous, Q6H PRN, Sudini, Srikar, MD .  oxyCODONE (Oxy IR/ROXICODONE) immediate release tablet 5 mg, 5 mg, Oral, Q6H PRN, Sudini, Srikar, MD .  polyethylene glycol (MIRALAX / GLYCOLAX) packet 17 g, 17 g, Oral, Daily PRN, Hillary Bow, MD   Physical exam:  Vitals:   02/15/18 2023 02/15/18 2116 02/16/18 0444 02/16/18 0725  BP: 116/67 109/67 117/65 (!) 102/44  Pulse: 77 80 89 80  Resp: 18 18 18 18   Temp: 97.7 F (36.5 C) 97.7 F (36.5 C) 98.4 F (36.9 C) 98.6 F (37 C)  TempSrc: Oral Oral Oral Oral  SpO2: 99% 100% 97% 97%  Weight:      Height:       Physical Exam  Constitutional: She is oriented to person, place, and time.  Obese. Does not appear to be in acute distress  HENT:  Head: Normocephalic and atraumatic.  Eyes: EOM are normal. Pupils are equal, round, and reactive to light.  Neck: Normal range of motion.  Cardiovascular: Normal rate, regular rhythm and normal heart sounds.  Pulmonary/Chest: Effort normal and breath sounds normal.  Abdominal: Soft. Bowel sounds are normal.  Neurological: She is alert and oriented to person, place, and time.  Skin: Skin is warm and dry.       CMP Latest Ref Rng & Units 02/16/2018  Glucose 65 - 99 mg/dL 231(H)  BUN 6 - 20 mg/dL 12  Creatinine 0.44 - 1.00 mg/dL  1.17(H)  Sodium 135 - 145 mmol/L 140  Potassium 3.5 - 5.1 mmol/L 3.9  Chloride 101 - 111 mmol/L 104  CO2 22 - 32 mmol/L 25  Calcium 8.9 - 10.3 mg/dL 9.0  Total Protein 6.5 - 8.1 g/dL -  Total Bilirubin 0.3 - 1.2 mg/dL -  Alkaline Phos 38 - 126 U/L -  AST 15 - 41 U/L -  ALT 14 - 54 U/L -   CBC Latest Ref Rng & Units 02/16/2018  WBC 3.6 - 11.0 K/uL 1.5(L)  Hemoglobin 12.0 - 16.0 g/dL 7.8(L)  Hematocrit 35.0 - 47.0 % 22.8(L)  Platelets 150 - 440 K/uL 20    @IMAGES @  Ct Head Wo Contrast  Result Date: 02/15/2018 CLINICAL DATA:  Dizziness and left-sided weakness. History of lung carcinoma EXAM: CT HEAD WITHOUT CONTRAST TECHNIQUE: Contiguous axial images were obtained from the base of the skull through the vertex without intravenous contrast. COMPARISON:  Brain MRI Apr 27, 2017 FINDINGS: Brain: The ventricles are normal in size and configuration. 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 There is no intracranial mass, hemorrhage, extra-axial fluid collection, or midline shift. There is mild small vessel disease in the centra semiovale bilaterally. Elsewhere gray-white compartments appear normal. No acute infarct evident. Vascular: No hyperdense vessel. There is calcification in each carotid siphon region. Skull: Bony calvarium appears intact. Sinuses/Orbits: There is mucosal thickening in several ethmoid air cells. There is a benign osteoma at the right frontal-right ethmoid junction measuring 1.3 x 1.2 cm. Other visualized paranasal sinuses are clear. Visualized orbits appear symmetric bilaterally. Other: Mastoid air cells are clear. IMPRESSION: 1. Chiari I malformation. Note that the cerebellar tonsils impress upon the upper cervical cord. This finding may have clinical significance and warrants clinical assessment in this regard. 2. Mild periventricular small vessel disease. No acute infarct. No mass  or hemorrhage. 3.  There are foci of arterial vascular calcification. 4.  There is mucosal thickening in several ethmoid air cells. Electronically Signed   By: Lowella Grip III M.D.   On: 02/15/2018 14:07    Assessment and plan- Patient is a 67 y.o. female with locally advanced lung cancer currently on palliative carboplatin and gemcitabine.  Last dose of chemo was on 02/05/2018.  She has been admitted for weakness and chemo induced pancytopenia  1.  Pancytopenia secondary to chemotherapy.  Her white count is 1.5 today and her ANC from yesterday was 0.8.  Given that she does not have any evidence of acute infection or sepsis I would recommend to hold off on Neupogen at this time.  If she were to  develop any fever then she would need complete infectious workup including chest x-ray urinalysis and blood culture and we could consider adding Neupogen at that time. She can receive 1 more unit of prbc today and if she is doing better can be discharged home tomorrow. We will arrange follow up for her next week with cbc  2. Severe thrombocytopenia: Recommend platelet transfusion only if counts are less than 10 or evidence of clinical bleeding.  3. Chiari I malformation: Neurosurgery to follow-up with the patient as an outpatient   Visit Diagnosis 1. Pancytopenia due to chemotherapy ([place])   2. Chiari I malformation ([place])   3. Dizziness     Dr. Randa Evens, MD, MPH [facility] at [facility] Pager- [phone] 02/16/2018 1:53 PM

## 2018-02-16 NOTE — Progress Notes (Addendum)
Inpatient Diabetes Program Recommendations  AACE/ADA: New Consensus Statement on Inpatient Glycemic Control (2015)  Target Ranges:  Prepandial:   less than 140 mg/dL      Peak postprandial:   less than 180 mg/dL (1-2 hours)      Critically ill patients:  140 - 180 mg/dL   Results for Susan Willis, Susan Willis (MRN [id number]) as of 02/16/2018 10:59  Ref. Range 02/15/2018 17:02 02/15/2018 22:21 02/16/2018 07:38  Glucose-Capillary Latest Ref Range: 65 - 99 mg/dL 126 (H)  2 units Novolog  168 (H) 182 (H)  3 units Novolog +   52 units Lantus   Results for Susan Willis, Susan Willis (MRN [id number]) as of 02/16/2018 10:59  Ref. Range 02/15/2018 14:11  Hemoglobin A1C Latest Ref Range: 4.8 - 5.6 % 11.0 (H)  Average Glucose 269 mg/dl    Home DM Meds: Lantus 60 units daily       Amaryl 4 mg daily        Tradjenta 5 mg daily   Current Orders: Lantus 52 units daily      Novolog Moderate Correction Scale/ SSI (0-15 units) TID AC + HS      Note Lantus started this AM.  Agree with current Insulin orders.  Current Hemoglobin A1c of 11% shows poor glucose control at home.  This may be due to the steroids she receives with her Chemotherapy treatments.  Will try to see pt today to discuss glucose control at home.    Addendum 1:22pm- Spoke with patient about her current A1c of 11%.  Explained what an A1c is and what it measures.  Reminded patient that her goal A1c is 7% or less per ADA standards to prevent both acute and long-term complications.  Explained to patient the extreme importance of good glucose control at home.  Encouraged patient to check her CBGs at least bid at home (fasting and another check within the day) and to record all CBGs in a logbook for her Endocrinologist to review.  Patient stated she just recently started taking the Lantus again at home about 2 weeks ago.  Patient unclear as to why she suspended taking the Lantus?  Encouraged pt to take her Amaryl and Lantus daily.  Has no issues or  questions about how to use her Lantus insulin pen.  Also discussed with pt that the Decadron she takes with Chemo may be affecting her CBGs as well.     --Will follow patient during hospitalization--  Wyn Quaker RN, MSN, CDE Diabetes Coordinator Inpatient Glycemic Control Team Team Pager: [phone] (8a-5p)

## 2018-02-16 NOTE — Evaluation (Signed)
Physical Therapy Evaluation Patient Details Name: Susan Willis MRN: [id number] DOB: Oct 14, 1951 Today's Date: 02/16/2018   History of Present Illness  presented to ER from cancer center secondary to anemia, thrombocytopenia, weakness and dizziness; admitted with pancytopenia secondary to recent chemo.  Clinical Impression  Upon evaluation, patient alert and oriented; follows all commands and demonstrates fair/good insight and safety awareness.  Bilat UE/LE strength and ROM grossly symmetrical and WFL; no focal weakness or pain reported.  Able to complete bed mobility with close sup; sit/stand, basic transfers and gait (110') with RW, cga for safety.  Decreased cadence and gait speed, but no overt buckling, LOB noted.  BORG 8/10 with gait efforts; good awareness of signs/symptoms of fatigue, pacing self and self-initiating rest periods as needed. Would benefit from skilled PT to address above deficits and promote optimal return to PLOF; Recommend transition to [place] upon discharge from acute hospitalization.     Follow Up Recommendations Home health PT    Equipment Recommendations       Recommendations for Other Services       Precautions / Restrictions Precautions Precautions: Fall Restrictions Weight Bearing Restrictions: No      Mobility  Bed Mobility Overal bed mobility: Needs Assistance Bed Mobility: Supine to Sit     Supine to sit: Supervision        Transfers Overall transfer level: Needs assistance Equipment used: Rolling walker (2 wheeled) Transfers: Sit to/from Stand Sit to Stand: Min guard         General transfer comment: cuing for hand placement to maximize safety  Ambulation/Gait Ambulation/Gait assistance: Min guard Ambulation Distance (Feet): 110 Feet Assistive device: Rolling walker (2 wheeled)       General Gait Details: reciprocal stepping pattern with slow, but fairly steady, gait performance.  Broad turning radius, but no overt buckling,  LOB or safety issues noted.  Additional distance limited by fatigue, but patient with good awareness and ability to self-pace activity.  Stairs            Wheelchair Mobility    Modified Rankin (Stroke Patients Only)       Balance Overall balance assessment: Needs assistance Sitting-balance support: No upper extremity supported;Feet supported Sitting balance-Leahy Scale: Good     Standing balance support: Bilateral upper extremity supported Standing balance-Leahy Scale: Fair                               Pertinent Vitals/Pain Pain Assessment: No/denies pain    Home Living Family/patient expects to be discharged to:: Private residence Living Arrangements: Alone   Type of Home: House Home Access: Stairs to enter Entrance Stairs-Rails: None Entrance Stairs-Number of Steps: 1 Home Layout: One level Home Equipment: Walker - 2 wheels      Prior Function Level of Independence: Independent with assistive device(s)         Comments: Mod indep with RW for ADLs, household and limited community mobilization; denies fall history.  Daughter assists with errands, appointments and community needs as appropriate.     Hand Dominance   Dominant Hand: Right    Extremity/Trunk Assessment   Upper Extremity Assessment Upper Extremity Assessment: Overall WFL for tasks assessed    Lower Extremity Assessment Lower Extremity Assessment: Overall WFL for tasks assessed(grossly at least 4-/5 throughout)       Communication   Communication: No difficulties  Cognition Arousal/Alertness: Awake/alert Behavior During Therapy: WFL for tasks assessed/performed Overall Cognitive Status: Within  Functional Limits for tasks assessed                                        General Comments      Exercises     Assessment/Plan    PT Assessment Patient needs continued PT services  PT Problem List Decreased range of motion;Decreased strength;Decreased  activity tolerance;Decreased balance;Decreased mobility;Decreased coordination;Cardiopulmonary status limiting activity;Obesity       PT Treatment Interventions DME instruction;Gait training;Stair training;Functional mobility training;Therapeutic activities;Therapeutic exercise;Balance training;Patient/family education    PT Goals (Current goals can be found in the Care Plan section)  Acute Rehab PT Goals Patient Stated Goal: to return home PT Goal Formulation: With patient Time For Goal Achievement: 03/02/18 Potential to Achieve Goals: Good    Frequency Min 2X/week   Barriers to discharge Decreased caregiver support      Co-evaluation               AM-PAC PT "6 Clicks" Daily Activity  Outcome Measure Difficulty turning over in bed (including adjusting bedclothes, sheets and blankets)?: A Little Difficulty moving from lying on back to sitting on the side of the bed? : A Little Difficulty sitting down on and standing up from a chair with arms (e.g., wheelchair, bedside commode, etc,.)?: A Little Help needed moving to and from a bed to chair (including a wheelchair)?: A Little Help needed walking in hospital room?: A Little Help needed climbing 3-5 steps with a railing? : A Little 6 Click Score: 18    End of Session Equipment Utilized During Treatment: Gait belt Activity Tolerance: Patient tolerated treatment well;Patient limited by fatigue Patient left: in bed;with call bell/phone within reach;with family/visitor present Nurse Communication: Mobility status PT Visit Diagnosis: Muscle weakness (generalized) (M62.81)    Time: 6389-3734 PT Time Calculation (min) (ACUTE ONLY): 12 min   Charges:   PT Evaluation $PT Eval Low Complexity: 1 Low     PT G Codes:        Kynzley Dowson H. Owens Shark, PT, DPT, NCS 02/16/18, 2:15 PM [phone]

## 2018-02-16 NOTE — Progress Notes (Signed)
[facility] [place] NAME: Susan Willis    MR#:  [id number]  DATE OF BIRTH:  1951-04-15  SUBJECTIVE:   Patient here due to symptomatic anemia and pancytopenia. Hemoglobin improved post transfusion. Platelet count is still low but improved since yesterday. Remains leukopenic but afebrile.  REVIEW OF SYSTEMS:    Review of Systems  Constitutional: Negative for chills and fever.  HENT: Negative for congestion and tinnitus.   Eyes: Negative for blurred vision and double vision.  Respiratory: Negative for cough, shortness of breath and wheezing.   Cardiovascular: Negative for chest pain, orthopnea and PND.  Gastrointestinal: Negative for abdominal pain, diarrhea, nausea and vomiting.  Genitourinary: Negative for dysuria and hematuria.  Neurological: Positive for weakness. Negative for dizziness, sensory change and focal weakness.  All other systems reviewed and are negative.   Nutrition: Carb modified Tolerating Diet: Yes Tolerating PT: Ambulatory  DRUG ALLERGIES:  No Known Allergies  VITALS:  Blood pressure (!) 101/45, pulse 80, temperature 97.7 F (36.5 C), temperature source Oral, resp. rate 18, height 5\' 4"  (1.626 m), weight 118.4 kg (261 lb), SpO2 97 %.  PHYSICAL EXAMINATION:   Physical Exam  GENERAL:  67 y.o.-year-old obese patient lying in bed in no acute distress.  EYES: Pupils equal, round, reactive to light and accommodation. No scleral icterus. Extraocular muscles intact.  HEENT: Head atraumatic, normocephalic. Oropharynx and nasopharynx clear.  NECK:  Supple, no jugular venous distention. No thyroid enlargement, no tenderness.  LUNGS: Normal breath sounds bilaterally, no wheezing, rales, rhonchi. No use of accessory muscles of respiration.  CARDIOVASCULAR: S1, S2 normal. No murmurs, rubs, or gallops.  ABDOMEN: Soft, nontender, nondistended. Bowel sounds present. No organomegaly or mass.  EXTREMITIES: No cyanosis, clubbing  or edema b/l.    NEUROLOGIC: Cranial nerves II through XII are intact. No focal Motor or sensory deficits b/l.   PSYCHIATRIC: The patient is alert and oriented x 3.  SKIN: No obvious rash, lesion, or ulcer.    LABORATORY PANEL:   CBC Recent Labs  Lab 02/16/18 0500  WBC 1.5*  HGB 7.8*  HCT 22.8*  PLT 20   ------------------------------------------------------------------------------------------------------------------  Chemistries  Recent Labs  Lab 02/15/18 1411 02/16/18 0500  NA 138 140  K 4.0 3.9  CL 105 104  CO2 24 25  GLUCOSE 171* 231*  BUN 13 12  CREATININE 1.08* 1.17*  CALCIUM 9.0 9.0  AST 30  --   ALT 18  --   ALKPHOS 68  --   BILITOT 0.5  --    ------------------------------------------------------------------------------------------------------------------  Cardiac Enzymes Recent Labs  Lab 02/15/18 1411  TROPONINI <0.03   ------------------------------------------------------------------------------------------------------------------  RADIOLOGY:  Ct Head Wo Contrast  Result Date: 02/15/2018 CLINICAL DATA:  Dizziness and left-sided weakness. History of lung carcinoma EXAM: CT HEAD WITHOUT CONTRAST TECHNIQUE: Contiguous axial images were obtained from the base of the skull through the vertex without intravenous contrast. COMPARISON:  Brain MRI Apr 27, 2017 FINDINGS: Brain: The ventricles are normal in size and configuration. 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 There is no intracranial mass, hemorrhage, extra-axial fluid collection, or midline shift. There is mild small vessel disease in the centra semiovale bilaterally. Elsewhere gray-white compartments appear normal. No acute infarct evident. Vascular: No hyperdense vessel. There is calcification in each carotid siphon region. Skull: Bony calvarium appears intact. Sinuses/Orbits: There is mucosal  thickening in several ethmoid air  cells. There is a benign osteoma at the right frontal-right ethmoid junction measuring 1.3 x 1.2 cm. Other visualized paranasal sinuses are clear. Visualized orbits appear symmetric bilaterally. Other: Mastoid air cells are clear. IMPRESSION: 1. Chiari I malformation. Note that the cerebellar tonsils impress upon the upper cervical cord. This finding may have clinical significance and warrants clinical assessment in this regard. 2. Mild periventricular small vessel disease. No acute infarct. No mass or hemorrhage. 3.  There are foci of arterial vascular calcification. 4.  There is mucosal thickening in several ethmoid air cells. Electronically Signed   By: Lowella Grip III M.D.   On: 02/15/2018 14:07     ASSESSMENT AND PLAN:   67 year old female with past medical history of lung cancer currently undergoing chemotherapy, diabetes, hypertension, hyperlipidemia, diabetic neuropathy who presented to the hospital due to weakness and noted to be pancytopenic.  1. Pancytopenia-this is secondary to chemotherapy. Patient's white cell count is stable but remains low. -Hemoglobin is improved posttransfusion will continue to monitor. Platelet count is low but improved since yesterday no acute need for transfusion presently. -Patient still complains of some weakness we'll transfuse 1 more unit of packed red blood cells and repeat hemoglobin tomorrow.  2. Severe thrombocytopenia-also secondary to chemotherapy. No acute bleeding. Continue to follow platelet count for now. No acute need for transfusion.  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 Seen by neurosurgery and no acute intervention needed. Follow up with neurosurgery as an outpatient. Patient is asymptomatic.  4.diabetes type 2 without complication-continue Lantus, sliding scale insulin. Blood sugar stable.  5. Hypothyroidism-continue Synthroid.  6. Hyperlipidemia-continue  atorvastatin.   All the records are reviewed and case discussed with Care Management/Social Worker. Management plans discussed with the patient, family and they are in agreement.  CODE STATUS: DNR  DVT Prophylaxis: Ted's & SCD's.   TOTAL TIME TAKING CARE OF THIS PATIENT: 30 minutes.   POSSIBLE D/C IN 1-2 DAYS, DEPENDING ON CLINICAL CONDITION.   Henreitta Leber M.D on 02/16/2018 at 2:04 PM  Between 7am to 6pm - Pager - [phone]  After 6pm go to [email or web address] - Proofreader  [facility]  Office  [phone]  CC: Primary care physician; [name], MD

## 2018-02-17 LAB — CBC
HEMATOCRIT: 27.6 % — AB (ref 35.0–47.0)
Hemoglobin: 9.2 g/dL — ABNORMAL LOW (ref 12.0–16.0)
MCH: 30.5 pg (ref 26.0–34.0)
MCHC: 33.5 g/dL (ref 32.0–36.0)
MCV: 91 fL (ref 80.0–100.0)
Platelets: 46 10*3/uL — ABNORMAL LOW (ref 150–440)
RBC: 3.03 MIL/uL — ABNORMAL LOW (ref 3.80–5.20)
RDW: 17.3 % — ABNORMAL HIGH (ref 11.5–14.5)
WBC: 1.5 10*3/uL — ABNORMAL LOW (ref 3.6–11.0)

## 2018-02-17 LAB — TYPE AND SCREEN
ABO/RH(D): O POS
Antibody Screen: NEGATIVE
UNIT DIVISION: 0
Unit division: 0

## 2018-02-17 LAB — BPAM RBC
Blood Product Expiration Date: 201904042359
Blood Product Expiration Date: 201904092359
ISSUE DATE / TIME: 201903141705
ISSUE DATE / TIME: 201903151438
UNIT TYPE AND RH: 5100
Unit Type and Rh: 5100

## 2018-02-17 LAB — GLUCOSE, CAPILLARY
GLUCOSE-CAPILLARY: 121 mg/dL — AB (ref 65–99)
Glucose-Capillary: 130 mg/dL — ABNORMAL HIGH (ref 65–99)

## 2018-02-17 MED ORDER — POLYETHYLENE GLYCOL 3350 17 G PO PACK: 17.0000 g | PACK | Freq: Every day | ORAL | 0 refills | Status: AC | PRN

## 2018-02-17 MED ORDER — HEPARIN SOD (PORK) LOCK FLUSH 100 UNIT/ML IV SOLN: 500.0000 [IU] | Freq: Once | INTRAVENOUS | Status: DC

## 2018-02-17 MED ORDER — FUROSEMIDE 10 MG/ML IJ SOLN
40.0000 mg | Freq: Once | INTRAMUSCULAR | Status: AC
  Administered 2018-02-17: 12:00:00 40 mg via INTRAVENOUS
  Filled 2018-02-17: qty 4

## 2018-02-17 MED ORDER — INSULIN GLARGINE 100 UNIT/ML ~~LOC~~ SOLN: 52.0000 [IU] | Freq: Every day | SUBCUTANEOUS | 11 refills | Status: AC

## 2018-02-17 NOTE — Progress Notes (Signed)
Patient discharged home with son, port deaccessed. Patient verbalized understanding of education, patient with no complaints.

## 2018-02-17 NOTE — Discharge Summary (Signed)
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 Arthritis   . Blood dyscrasia   . Cancer of left lung ([place]) 08/2015   Rad + chemo tx's.  . Diabetes mellitus without complication ([place])   . Diabetic neuropathy ([place])   . High cholesterol   . Hypertension   . Insulin dependent diabetes mellitus ([place])     HOSPITAL COURSE:   1.  Chemotherapy-induced pancytopenia.  Patient received 2 units of packed red blood cells with good response hemoglobin now up to 9.2.  White blood cell count 1.5.  Platelet count has improved to 44,000.  Platelet count when she came in was 14. 2.  Chiari 1 malformation.  Patient was seen by neurosurgery and no procedures at this time needed.  This was seen on prior imaging also. 3.  Type 2 diabetes mellitus.  Continue Lantus as outpatient.  Discontinue oral medications 4.  Relative hypotension.  Hold lisinopril 5.  Hypothyroidism unspecified on levothyroxine 6.  Hyperlipidemia unspecified on atorvastatin 7.  Physical therapy recommended home health PT which we set up. 8.  Malalignment of the eyes and dizziness.  Patient stated the oncologist said this can happen with the chemotherapy.  Since she does not see well out of the right eye to begin with, she can wear a patch over the right eye.  I did recommend having her follow-up with the ophthalmologist next week. 9.  Patient was also given 1 dose of Lasix with swelling of the eyelids and after blood  transfusion.   DISCHARGE CONDITIONS:   Fair  CONSULTS OBTAINED:  Treatment Team:  Meade Maw, MD [name], MD  DRUG ALLERGIES:  No Known Allergies  DISCHARGE MEDICATIONS:   Allergies as of 02/17/2018   No Known Allergies     Medication List    STOP taking these medications   glimepiride 4 MG tablet Commonly known as:  AMARYL   linagliptin 5 MG Tabs tablet Commonly known as:  TRADJENTA   lisinopril 10 MG tablet Commonly known as:  PRINIVIL,ZESTRIL   ondansetron 8 MG tablet Commonly known as:  ZOFRAN     TAKE these medications   albuterol 108 (90 Base) MCG/ACT inhaler Commonly known as:  PROVENTIL HFA;VENTOLIN HFA Inhale 2 puffs into the lungs every 6 (six) hours as needed for wheezing or shortness of breath.   atorvastatin 20 MG tablet Commonly known as:  LIPITOR Take 20 mg by mouth daily at 6 PM.   gabapentin 300 MG capsule Commonly known as:  NEURONTIN Take 1 capsule (300 mg total) by mouth at bedtime.   insulin glargine 100 UNIT/ML injection Commonly known as:  LANTUS Inject 0.52 mLs (52 Units total) into the skin daily. What changed:  how much to take   levothyroxine 150 MCG tablet Commonly known as:  SYNTHROID, LEVOTHROID Take 200 mcg by mouth daily before breakfast.   lidocaine-prilocaine cream Commonly known as:  EMLA Apply to affected area once   magic mouthwash w/lidocaine Soln Take 5 mLs  by mouth 4 (four) times daily.   oxyCODONE 5 MG immediate release tablet Commonly known as:  Oxy IR/ROXICODONE Take 1 tablet (5 mg total) by mouth every 6 (six) hours as needed for severe pain.   polyethylene glycol packet Commonly known as:  MIRALAX / GLYCOLAX Take 17 g by mouth daily as needed for mild constipation.   prochlorperazine 10 MG tablet Commonly known as:  COMPAZINE Take 1 tablet (10 mg total) by mouth every 6 (six) hours as needed (Nausea or vomiting).        DISCHARGE INSTRUCTIONS:   Follow-up with Dr.  Grayland Ormond 1 week [place] next week  If you experience worsening of your admission symptoms, develop shortness of breath, life threatening emergency, suicidal or homicidal thoughts you must seek medical attention immediately by calling 911 or calling your MD immediately  if symptoms less severe.  You Must read complete instructions/literature along with all the possible adverse reactions/side effects for all the Medicines you take and that have been prescribed to you. Take any new Medicines after you have completely understood and accept all the possible adverse reactions/side effects.   Please note  You were cared for by a hospitalist during your hospital stay. If you have any questions about your discharge medications or the care you received while you were in the hospital after you are discharged, you can call the unit and asked to speak with the hospitalist on call if the hospitalist that took care of you is not available. Once you are discharged, your primary care physician will handle any further medical issues. Please note that NO REFILLS for any discharge medications will be authorized once you are discharged, as it is imperative that you return to your primary care physician (or establish a relationship with a primary care physician if you do not have one) for your aftercare needs so that they can reassess your need for medications and monitor your lab values.    Today   CHIEF COMPLAINT:   Chief Complaint  Patient presents with  . abnormal labs    HISTORY OF PRESENT ILLNESS:  Susan Willis  is a 67 y.o. female sent in for pancytopenia   VITAL SIGNS:  Blood pressure 119/62, pulse 82, temperature 98.1 F (36.7 C), temperature source Oral, resp. rate 16, height 5\' 4"  (1.626 m), weight 121.3 kg (267 lb 8 oz), SpO2 96 %.   PHYSICAL EXAMINATION:  GENERAL:  67 y.o.-year-old patient lying in the bed with no acute distress.  EYES: Pupils equal, round, reactive to  light and accommodation. No scleral icterus.  Right eye does not go all the way out to the right.  Eyes slightly misaligned when looking straight at her. HEENT: Head atraumatic, normocephalic. Oropharynx and nasopharynx clear.  NECK:  Supple, no jugular venous distention. No thyroid enlargement, no tenderness.  LUNGS:  decrease breath sounds bilateral base, no wheezing, rales,rhonchi or crepitation. No use of accessory muscles of respiration.  CARDIOVASCULAR: S1, S2 normal. No murmurs, rubs, or gallops.  ABDOMEN: Soft, non-tender, non-distended. Bowel sounds present. No organomegaly. EXTREMITIES: Trace  edema, no cyanosis, or clubbing.  NEUROLOGIC: Cranial nerves II through XII are intact. Muscle strength 5/5 in all extremities. Sensation intact. Gait not checked.  PSYCHIATRIC: The patient is alert and oriented x 3.  SKIN: No obvious rash, lesion, or ulcer.   DATA REVIEW:   CBC Recent Labs  Lab 02/17/18 0500  WBC 1.5*  HGB 9.2*  HCT 27.6*  PLT 46*  Chemistries  Recent Labs  Lab 02/15/18 1411 02/16/18 0500  NA 138 140  K 4.0 3.9  CL 105 104  CO2 24 25  GLUCOSE 171* 231*  BUN 13 12  CREATININE 1.08* 1.17*  CALCIUM 9.0 9.0  AST 30  --   ALT 18  --   ALKPHOS 68  --   BILITOT 0.5  --     Cardiac Enzymes Recent Labs  Lab 02/15/18 1411  TROPONINI <0.03      RADIOLOGY:  Ct Head Wo Contrast  Result Date: 02/15/2018 CLINICAL DATA:  Dizziness and left-sided weakness. History of lung carcinoma EXAM: CT HEAD WITHOUT CONTRAST TECHNIQUE: Contiguous axial images were obtained from the base of the skull through the vertex without intravenous contrast. COMPARISON:  Brain MRI Apr 27, 2017 FINDINGS: Brain: The ventricles are normal in size and configuration. 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 There is no intracranial mass, hemorrhage, extra-axial fluid collection, or midline  shift. There is mild small vessel disease in the centra semiovale bilaterally. Elsewhere gray-white compartments appear normal. No acute infarct evident. Vascular: No hyperdense vessel. There is calcification in each carotid siphon region. Skull: Bony calvarium appears intact. Sinuses/Orbits: There is mucosal thickening in several ethmoid air cells. There is a benign osteoma at the right frontal-right ethmoid junction measuring 1.3 x 1.2 cm. Other visualized paranasal sinuses are clear. Visualized orbits appear symmetric bilaterally. Other: Mastoid air cells are clear. IMPRESSION: 1. Chiari I malformation. Note that the cerebellar tonsils impress upon the upper cervical cord. This finding may have clinical significance and warrants clinical assessment in this regard. 2. Mild periventricular small vessel disease. No acute infarct. No mass or hemorrhage. 3.  There are foci of arterial vascular calcification. 4.  There is mucosal thickening in several ethmoid air cells. Electronically Signed   By: Lowella Grip III M.D.   On: 02/15/2018 14:07      Management plans discussed with the patient, family and they are in agreement.  CODE STATUS:     Code Status Orders  (From admission, onward)        Start     Ordered   02/15/18 1509  Do not attempt resuscitation (DNR)  Continuous    Question Answer Comment  In the event of cardiac or respiratory ARREST Do not call a "code blue"   In the event of cardiac or respiratory ARREST Do not perform Intubation, CPR, defibrillation or ACLS   In the event of cardiac or respiratory ARREST Use medication by any route, position, wound care, and other measures to relive pain and suffering. May use oxygen, suction and manual treatment of airway obstruction as needed for comfort.      02/15/18 1509    Code Status History    Date Active Date Inactive Code Status Order ID Comments User Context   This patient has a current code status but no historical code status.     Advance Directive Documentation     Most Recent Value  Type of Advance Directive  Healthcare Power of Attorney  Pre-existing out of facility DNR order (yellow form or pink MOST form)  No data  "MOST" Form in Place?  No data      TOTAL TIME TAKING CARE OF THIS PATIENT: 38 minutes.    Loletha Grayer M.D on 02/17/2018 at 1:14 PM  Between 7am to 6pm - Pager - [phone]  After 6pm  go to [email or web address] - password [email or web address]  [facility] [employer] Office  [phone]  CC: Primary care physician; [name], MD

## 2018-02-21 ENCOUNTER — Inpatient Hospital Stay (HOSPITAL_BASED_OUTPATIENT_CLINIC_OR_DEPARTMENT_OTHER): Payer: PPO | Admitting: Oncology

## 2018-02-21 ENCOUNTER — Inpatient Hospital Stay: Payer: PPO

## 2018-02-21 DIAGNOSIS — R42 Dizziness and giddiness: Secondary | ICD-10-CM | POA: Diagnosis not present

## 2018-02-21 DIAGNOSIS — C3412 Malignant neoplasm of upper lobe, left bronchus or lung: Secondary | ICD-10-CM

## 2018-02-21 DIAGNOSIS — Z5111 Encounter for antineoplastic chemotherapy: Secondary | ICD-10-CM | POA: Diagnosis not present

## 2018-02-21 DIAGNOSIS — E1165 Type 2 diabetes mellitus with hyperglycemia: Secondary | ICD-10-CM | POA: Diagnosis not present

## 2018-02-21 DIAGNOSIS — R5383 Other fatigue: Secondary | ICD-10-CM | POA: Diagnosis present

## 2018-02-21 DIAGNOSIS — D61810 Antineoplastic chemotherapy induced pancytopenia: Secondary | ICD-10-CM | POA: Diagnosis not present

## 2018-02-21 DIAGNOSIS — E1140 Type 2 diabetes mellitus with diabetic neuropathy, unspecified: Secondary | ICD-10-CM | POA: Diagnosis not present

## 2018-02-21 DIAGNOSIS — D649 Anemia, unspecified: Secondary | ICD-10-CM | POA: Diagnosis not present

## 2018-02-21 DIAGNOSIS — R531 Weakness: Secondary | ICD-10-CM | POA: Diagnosis not present

## 2018-02-21 DIAGNOSIS — D696 Thrombocytopenia, unspecified: Secondary | ICD-10-CM | POA: Diagnosis not present

## 2018-02-21 DIAGNOSIS — R51 Headache: Secondary | ICD-10-CM | POA: Diagnosis not present

## 2018-02-21 DIAGNOSIS — I10 Essential (primary) hypertension: Secondary | ICD-10-CM | POA: Diagnosis not present

## 2018-02-21 DIAGNOSIS — D701 Agranulocytosis secondary to cancer chemotherapy: Secondary | ICD-10-CM | POA: Diagnosis not present

## 2018-02-21 LAB — COMPREHENSIVE METABOLIC PANEL
ALBUMIN: 3.3 g/dL — AB (ref 3.5–5.0)
ALK PHOS: 79 U/L (ref 38–126)
ALT: 17 U/L (ref 14–54)
AST: 30 U/L (ref 15–41)
Anion gap: 8 (ref 5–15)
BILIRUBIN TOTAL: 0.6 mg/dL (ref 0.3–1.2)
BUN: 21 mg/dL — AB (ref 6–20)
CALCIUM: 8.6 mg/dL — AB (ref 8.9–10.3)
CO2: 27 mmol/L (ref 22–32)
Chloride: 106 mmol/L (ref 101–111)
Creatinine, Ser: 1.03 mg/dL — ABNORMAL HIGH (ref 0.44–1.00)
GFR calc Af Amer: >60 mL/min (ref >60)
GFR calc non Af Amer: 55 mL/min — ABNORMAL LOW (ref >60)
GLUCOSE: 190 mg/dL — AB (ref 65–99)
Potassium: 3.9 mmol/L (ref 3.5–5.1)
Sodium: 141 mmol/L (ref 135–145)
TOTAL PROTEIN: 7.2 g/dL (ref 6.5–8.1)

## 2018-02-21 LAB — CBC WITH DIFFERENTIAL/PLATELET
BASOS ABS: 0 10*3/uL (ref 0–0.1)
BASOS PCT: 1 %
Eosinophils Absolute: 0 10*3/uL (ref 0–0.7)
Eosinophils Relative: 2 %
HEMATOCRIT: 25.5 % — AB (ref 35.0–47.0)
HEMOGLOBIN: 8.6 g/dL — AB (ref 12.0–16.0)
Lymphocytes Relative: 33 %
Lymphs Abs: 0.5 10*3/uL — ABNORMAL LOW (ref 1.0–3.6)
MCH: 31.8 pg (ref 26.0–34.0)
MCHC: 33.9 g/dL (ref 32.0–36.0)
MCV: 93.6 fL (ref 80.0–100.0)
Monocytes Absolute: 0.5 10*3/uL (ref 0.2–0.9)
Monocytes Relative: 30 %
NEUTROS ABS: 0.5 10*3/uL — AB (ref 1.4–6.5)
Neutrophils Relative %: 34 %
Platelets: 174 10*3/uL (ref 150–440)
RBC: 2.72 MIL/uL — AB (ref 3.80–5.20)
RDW: 17.6 % — ABNORMAL HIGH (ref 11.5–14.5)
WBC: 1.6 10*3/uL — AB (ref 3.6–11.0)

## 2018-02-21 LAB — SAMPLE TO BLOOD BANK

## 2018-02-21 MED ORDER — HEPARIN SOD (PORK) LOCK FLUSH 100 UNIT/ML IV SOLN
INTRAVENOUS | Status: AC
  Filled 2018-02-21: qty 5

## 2018-02-21 NOTE — Progress Notes (Signed)
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 She received only gemcitabine due to persitent neutropenia and thrombocytopenia. She was seen on 02/15/2018 in symptom management by nurse practitioner for complaints of dizziness and weakness for approximately 1 week. Additional complaints were shortness of breath, right-sided abdominal pain, increasing confusion/disorientation, mild dysphagia and worsening peripheral neuropathy. Patient has a history of Chiari I malformation and has been followed by a neurosurgeon at [facility]. Labs revealed severe anemia and thrombocytopenia with hemoglobin of 6.7. Platelet count 14. She was symptomatic with shortness of breath, dizziness weakness and fatigue. Given her history,she was taken to the emergency room for stat imaging to rule out stroke, aneurysm, malignancy or progression of Chiari 1 Malformation.  CT of the head while inpatient did not indicate further progression of chiari 1 malformation or any acute process. Dr. Cari Caraway, neurosurgeon for [facility] did not recommend surgical intervention but did recommend admission for management of pancytopenia. While inpatient, she received 2 units of packed red blood cells with a hemoglobin up to 9.2 at discharge. White count better at 1.5. Platelet  count improved to 44,000. She was given a dose of Lasix for the swelling of her eyelids and after her blood transfusion to prevent overload. Patient will be followed by neurosurgery and no procedures/surgerys are scheduled at this time given her co-morbid conditions. It is recommended she see an ophthalmologist for dizziness. She was discharged on 02/17/2018.  Interval history- Patient tells me she is feeling much better since discharge from the hospital. She continues to have dizziness but this has been a chronic long-standing problem for her. Weakness has improved and today she walked into clinic versus having to be placed in a wheelchair for evaluation last week. Daughter states she continues to have occasional confusion/disorientation but quickly recovers from this. Neuropathy is improving and she denies anymore dysphagia at this time. Appetite has improved. She denies any pain, peripheral edema, constipation, diarrhea, nausea, vomiting, chest pain or shortness of breath. She has follow-up with neurosurgery at [facility] in April. Per his note, she is supposed to have an MRI of brain. She states this had not been scheduled to date.   ECOG FS:1 - Symptomatic but completely ambulatory  Review of systems- Review of Systems  Constitutional: Positive for malaise/fatigue. Negative for chills, fever and weight loss.  HENT: Negative for congestion and ear pain.   Eyes: Negative.  Negative for blurred vision and double vision.  Respiratory: Negative.  Negative for cough, sputum production and shortness of breath.   Cardiovascular: Negative.  Negative for chest pain, palpitations and leg swelling.  Gastrointestinal: Negative.  Negative for abdominal pain, blood in stool, constipation, diarrhea, nausea and vomiting.  Genitourinary: Negative for dysuria, frequency and urgency.  Musculoskeletal: Positive for joint pain. Negative for back pain and falls.  Skin: Negative.  Negative for rash.  Neurological: Positive  for dizziness and  weakness. Negative for headaches.  Endo/Heme/Allergies: Negative.  Does not bruise/bleed easily.  Psychiatric/Behavioral: Negative.  Negative for depression. The patient is not nervous/anxious and does not have insomnia.      Current treatment- S/p carbo/gem cycle 3 Day 8 on 02/05/18.   No Known Allergies   Past Medical History:  Diagnosis Date  . Arthritis   . Blood dyscrasia   . Cancer of left lung ([place]) 08/2015   Rad + chemo tx's.  . Diabetes mellitus without complication ([place])   . Diabetic neuropathy ([place])   . High cholesterol   . Hypertension   . Insulin dependent diabetes mellitus ([place])      Past Surgical History:  Procedure Laterality Date  . ABDOMINAL HYSTERECTOMY    . CESAREAN SECTION    . ELECTROMAGNETIC NAVIGATION BROCHOSCOPY N/A 11/26/2015   Procedure: ELECTROMAGNETIC NAVIGATION BRONCHOSCOPY;  Surgeon: Flora Lipps, MD;  Location: [facility] ORS;  Service: Cardiopulmonary;  Laterality: N/A;  . ENDOBRONCHIAL ULTRASOUND N/A 11/26/2015   Procedure: ENDOBRONCHIAL ULTRASOUND;  Surgeon: Flora Lipps, MD;  Location: [facility] ORS;  Service: Cardiopulmonary;  Laterality: N/A;  . PERIPHERAL VASCULAR CATHETERIZATION N/A 12/09/2015   Procedure: Glori Luis Cath Insertion;  Surgeon: Katha Cabal, MD;  Location: [facility];  Service: Cardiovascular;  Laterality: N/A;    Social History   Socioeconomic History  . Marital status: Widowed    Spouse name: Not on file  . Number of children: Not on file  . Years of education: Not on file  . Highest education level: Not on file  Occupational History  . Not on file  Social Needs  . Financial resource strain: Not on file  . Food insecurity:    Worry: Not on file    Inability: Not on file  . Transportation needs:    Medical: Not on file    Non-medical: Not on file  Tobacco Use  . Smoking status: Current Every Day Smoker    Packs/day: 0.30    Years: 35.00    Pack years: 10.50    Types: Cigarettes  . Smokeless  tobacco: Never Used  . Tobacco comment: workiing on quitting, down to 2 packs per week now  Substance and Sexual Activity  . Alcohol use: No    Alcohol/week: 0.0 oz  . Drug use: No  . Sexual activity: Not on file  Lifestyle  . Physical activity:    Days per week: Not on file    Minutes per session: Not on file  . Stress: Not on file  Relationships  . Social connections:    Talks on phone: Not on file    Gets together: Not on file    Attends religious service: Not on file    Active member of club or organization: Not on file    Attends meetings of clubs or organizations: Not on file    Relationship status: Not on file  . Intimate partner violence:    Fear of current or ex partner: Not on file    Emotionally abused: Not on file    Physically abused: Not on file    Forced sexual activity: Not on file  Other Topics Concern  . Not on file  Social History Narrative  . Not on file    No family history on file.   Current Outpatient Medications:  .  albuterol (PROVENTIL HFA;VENTOLIN HFA) 108 (90 Base) MCG/ACT inhaler, Inhale 2 puffs into the lungs every 6 (six) hours as needed for wheezing or shortness of breath. (Patient not  taking: Reported on 01/29/2018), Disp: 1 Inhaler, Rfl: 2 .  atorvastatin (LIPITOR) 20 MG tablet, Take 20 mg by mouth daily at 6 PM., Disp: , Rfl:  .  gabapentin (NEURONTIN) 300 MG capsule, Take 1 capsule (300 mg total) by mouth at bedtime. (Patient not taking: Reported on 02/05/2018), Disp: 30 capsule, Rfl: 2 .  insulin glargine (LANTUS) 100 UNIT/ML injection, Inject 0.52 mLs (52 Units total) into the skin daily., Disp: 10 mL, Rfl: 11 .  levothyroxine (SYNTHROID, LEVOTHROID) 150 MCG tablet, Take 200 mcg by mouth daily before breakfast. , Disp: , Rfl:  .  lidocaine-prilocaine (EMLA) cream, Apply to affected area once, Disp: 30 g, Rfl: 6 .  magic mouthwash w/lidocaine SOLN, Take 5 mLs by mouth 4 (four) times daily., Disp: 240 mL, Rfl: 1 .  oxyCODONE (OXY  IR/ROXICODONE) 5 MG immediate release tablet, Take 1 tablet (5 mg total) by mouth every 6 (six) hours as needed for severe pain., Disp: 30 tablet, Rfl: 0 .  polyethylene glycol (MIRALAX / GLYCOLAX) packet, Take 17 g by mouth daily as needed for mild constipation., Disp: 30 each, Rfl: 0 .  prochlorperazine (COMPAZINE) 10 MG tablet, Take 1 tablet (10 mg total) by mouth every 6 (six) hours as needed (Nausea or vomiting)., Disp: 60 tablet, Rfl: 2  Physical exam: There were no vitals filed for this visit. Physical Exam  Constitutional: She is oriented to person, place, and time and well-developed, well-nourished, and in no distress. Vital signs are normal.  HENT:  Head: Normocephalic and atraumatic.  Eyes: Pupils are equal, round, and reactive to light.  Neck: Normal range of motion. Edema (bilateral lower extremities-trace) present.  Cardiovascular: Normal rate, regular rhythm, normal heart sounds and normal pulses.  No murmur heard. Pulmonary/Chest: Effort normal and breath sounds normal. She has no wheezes.  Abdominal: Soft. Normal appearance and bowel sounds are normal. She exhibits no distension. There is no tenderness.  Musculoskeletal: Normal range of motion. She exhibits no edema.  Neurological: She is alert and oriented to person, place, and time. Gait normal.  Skin: Skin is warm and dry. No rash noted.  Psychiatric: Mood, memory, affect and judgment normal.     CMP Latest Ref Rng & Units 02/21/2018  Glucose 65 - 99 mg/dL 190(H)  BUN 6 - 20 mg/dL 21(H)  Creatinine 0.44 - 1.00 mg/dL 1.03(H)  Sodium 135 - 145 mmol/L 141  Potassium 3.5 - 5.1 mmol/L 3.9  Chloride 101 - 111 mmol/L 106  CO2 22 - 32 mmol/L 27  Calcium 8.9 - 10.3 mg/dL 8.6(L)  Total Protein 6.5 - 8.1 g/dL 7.2  Total Bilirubin 0.3 - 1.2 mg/dL 0.6  Alkaline Phos 38 - 126 U/L 79  AST 15 - 41 U/L 30  ALT 14 - 54 U/L 17   CBC Latest Ref Rng & Units 02/21/2018  WBC 3.6 - 11.0 K/uL 1.6(L)  Hemoglobin 12.0 - 16.0 g/dL 8.6(L)    Hematocrit 35.0 - 47.0 % 25.5(L)  Platelets 150 - 440 K/uL 174    No images are attached to the encounter.  Ct Head Wo Contrast  Result Date: 02/15/2018 CLINICAL DATA:  Dizziness and left-sided weakness. History of lung carcinoma EXAM: CT HEAD WITHOUT CONTRAST TECHNIQUE: Contiguous axial images were obtained from the base of the skull through the vertex without intravenous contrast. COMPARISON:  Brain MRI Apr 27, 2017 FINDINGS: Brain: The ventricles are normal in size and configuration. 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 There is cerebellar tonsillar ectopia with  Chiari I malformation, also seen on prior MR. There is no intracranial mass, hemorrhage, extra-axial fluid collection, or midline shift. There is mild small vessel disease in the centra semiovale bilaterally. Elsewhere gray-white compartments appear normal. No acute infarct evident. Vascular: No hyperdense vessel. There is calcification in each carotid siphon region. Skull: Bony calvarium appears intact. Sinuses/Orbits: There is mucosal thickening in several ethmoid air cells. There is a benign osteoma at the right frontal-right ethmoid junction measuring 1.3 x 1.2 cm. Other visualized paranasal sinuses are clear. Visualized orbits appear symmetric bilaterally. Other: Mastoid air cells are clear. IMPRESSION: 1. Chiari I malformation. Note that the cerebellar tonsils impress upon the upper cervical cord. This finding may have clinical significance and warrants clinical assessment in this regard. 2. Mild periventricular small vessel disease. No acute infarct. No mass or hemorrhage. 3.  There are foci of arterial vascular calcification. 4.  There is mucosal thickening in several ethmoid air cells. Electronically Signed   By: Lowella Grip III M.D.   On: 02/15/2018 14:07     Assessment and plan- Patient is a 67 y.o. female who presents to symptom management for her hospital follow-up.  1. Stage IIIa squamous cell carcinoma  of the upper lobe of left lung: Status post cycle 3 day 8 of dose reduced gemcitabine due to persistent neutropenia and thrombocytopenia. She is scheduled to return on 02/26/2018 for labs, cycle 4 day 1 of gemcitabine and assessment by Dr. Grayland Ormond. Reimaging is scheduled after the completion of cycle 4.  2. Hospital follow-up: She is doing well. Chronic dizziness persists but confusion and weakness is getting better. She is walking today with a cane. She offers no additional complaints. Follow-up labs today reveal improving white count at 1.6 but a declining hemoglobin at 8.6. Previous hemoglobin at discharge from hospital was 9.2. She denies bloody stools, hematuria, hemoptysis, vaginal bleeding or gingival bleeding. She cannot remember her last colonoscopy (I don't see anything in records). She denies any additional abdominal pain. Her last chemotherapy was gemcitabine only on 02/05/2018. It is unlikely her counts are continuing to drop from last chemotherapy. Will speak to Dr. Grayland Ormond about getting her set up to see GI and will add on Iron/ferritin/folate/Vitamin B12 levels. Platelet count today 174. Much improved.    Visit Diagnosis 1. Anemia, unspecified type   2. Malignant neoplasm of upper lobe of left lung ([place])   3. Dizziness   4. Pancytopenia due to chemotherapy [facility])     Patient expressed understanding and was in agreement with this plan. She also understands that She can call clinic at any time with any questions, concerns, or complaints.   Greater than 50% was spent in counseling and coordination of care with this patient including but not limited to discussion of the relevant topics above (See A&P) including, but not limited to diagnosis and management of acute and chronic medical conditions.    Faythe Casa, AGNP-C [facility]- [phone] Pager- [phone] 02/23/2018 10:01 AM

## 2018-02-23 ENCOUNTER — Other Ambulatory Visit: Payer: Self-pay | Admitting: *Deleted

## 2018-02-23 ENCOUNTER — Telehealth: Payer: Self-pay | Admitting: Oncology

## 2018-02-23 DIAGNOSIS — D649 Anemia, unspecified: Secondary | ICD-10-CM

## 2018-02-23 NOTE — Telephone Encounter (Signed)
Called to check on patient. She states she is feeling "much better". She states her dizziness is much improved even over the past 2 days. She offers no new complaints today. She continues to deny any bloody bowel movements, hemoptysis, vaginal bleeding or hematuria. Appetite remains well. She is scheduled to return on Monday and we have the [place] set to pick her up in the morning.  Faythe Casa, NP 02/23/2018 10:09 AM

## 2018-02-23 NOTE — Progress Notes (Signed)
Ld  .

## 2018-02-25 ENCOUNTER — Other Ambulatory Visit: Payer: Self-pay | Admitting: Oncology

## 2018-02-25 NOTE — Progress Notes (Signed)
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  INTERVAL HISTORY: Patient returns to clinic today for further evaluation and consideration of cycle 4, day 1 of carboplatinum and gemcitabine.  She had a recent hospitalization for symptomatic pancytopenia as well as strokelike symptoms.  She did not appear to have any neurologic deficits and her symptoms improved with several units of packed red blood cells.  She continues to feel weak and fatigued, but nearly back to her baseline. She continues to have persistent dizziness. Her peripheral neuropathy is unchanged. She denies any recent fevers or illnesses. She denies any chest pain or shortness of breath. She denies nausea, constipation, diarrhea, or vomiting.  She has no urinary complaints. Patient offers no further specific complaints today.  REVIEW OF SYSTEMS:   Review of Systems  Constitutional: Positive for malaise/fatigue. Negative for fever and weight loss.  HENT: Negative.  Negative for congestion.   Eyes: Positive for blurred vision.  Respiratory: Negative.  Negative for cough, hemoptysis and shortness of breath.   Cardiovascular: Negative.  Negative for chest pain and leg swelling.  Gastrointestinal: Negative.  Negative for abdominal pain.  Genitourinary: Negative.  Negative for flank pain.  Musculoskeletal: Negative.  Negative for joint pain and myalgias.  Skin: Negative.  Negative for rash.  Neurological: Positive for dizziness, sensory change and weakness.  Negative for focal weakness and headaches.  Psychiatric/Behavioral: Negative.  Negative for memory loss. The patient is not nervous/anxious.     As per HPI. Otherwise, a complete review of systems is negative.  PAST MEDICAL HISTORY: Past Medical History:  Diagnosis Date  . Arthritis   . Blood dyscrasia   . Cancer of left lung ([place]) 08/2015   Rad + chemo tx's.  . Diabetes mellitus without complication ([place])   . Diabetic neuropathy ([place])   . High cholesterol   . Hypertension   . Insulin dependent diabetes mellitus ([place])     PAST SURGICAL HISTORY: Past Surgical History:  Procedure Laterality Date  . ABDOMINAL HYSTERECTOMY    . CESAREAN SECTION    . ELECTROMAGNETIC NAVIGATION BROCHOSCOPY N/A 11/26/2015   Procedure: ELECTROMAGNETIC NAVIGATION BRONCHOSCOPY;  Surgeon: Flora Lipps, MD;  Location: [facility];  Service: Cardiopulmonary;  Laterality: N/A;  . ENDOBRONCHIAL ULTRASOUND N/A 11/26/2015   Procedure: ENDOBRONCHIAL ULTRASOUND;  Surgeon: Flora Lipps, MD;  Location: [facility];  Service: Cardiopulmonary;  Laterality: N/A;  . PERIPHERAL VASCULAR CATHETERIZATION N/A 12/09/2015   Procedure: Glori Luis Cath Insertion;  Surgeon: Katha Cabal, MD;  Location: [facility];  Service: Cardiovascular;  Laterality: N/A;    FAMILY HISTORY: Reviewed and unchanged. No reported history of malignancy or chronic disease.     ADVANCED DIRECTIVES:    HEALTH MAINTENANCE: Social History   Tobacco Use  . Smoking status: Current Every Day Smoker    Packs/day: 0.30    Years: 35.00    Pack years: 10.50    Types: Cigarettes  . Smokeless tobacco: Never Used  . Tobacco comment: workiing on quitting, down to 2 packs per week now  Substance Use Topics  . Alcohol use: No  Alcohol/week: 0.0 oz  . Drug use: No     No Known Allergies  Current Outpatient Medications  Medication Sig Dispense Refill  . atorvastatin (LIPITOR) 20 MG tablet Take 20 mg by mouth daily at 6 PM.    . insulin  glargine (LANTUS) 100 UNIT/ML injection Inject 0.52 mLs (52 Units total) into the skin daily. 10 mL 11  . levothyroxine (SYNTHROID, LEVOTHROID) 150 MCG tablet Take 200 mcg by mouth daily before breakfast.     . lidocaine-prilocaine (EMLA) cream Apply to affected area once 30 g 6  . magic mouthwash w/lidocaine SOLN Take 5 mLs by mouth 4 (four) times daily. 240 mL 1  . oxyCODONE (OXY IR/ROXICODONE) 5 MG immediate release tablet Take 1 tablet (5 mg total) by mouth every 6 (six) hours as needed for severe pain. 30 tablet 0  . polyethylene glycol (MIRALAX / GLYCOLAX) packet Take 17 g by mouth daily as needed for mild constipation. 30 each 0  . prochlorperazine (COMPAZINE) 10 MG tablet Take 1 tablet (10 mg total) by mouth every 6 (six) hours as needed (Nausea or vomiting). 60 tablet 2  . albuterol (PROVENTIL HFA;VENTOLIN HFA) 108 (90 Base) MCG/ACT inhaler Inhale 2 puffs into the lungs every 6 (six) hours as needed for wheezing or shortness of breath. (Patient not taking: Reported on 01/29/2018) 1 Inhaler 2  . gabapentin (NEURONTIN) 300 MG capsule Take 1 capsule (300 mg total) by mouth at bedtime. (Patient not taking: Reported on 02/05/2018) 30 capsule 2   No current facility-administered medications for this visit.    Facility-Administered Medications Ordered in Other Visits  Medication Dose Route Frequency Provider Last Rate Last Dose  . heparin lock flush 100 unit/mL  500 Units Intravenous Once [name], MD      . sodium chloride flush (NS) 0.9 % injection 10 mL  10 mL Intravenous PRN [name], MD   10 mL at 02/26/18 0843    OBJECTIVE: Vitals:   02/26/18 0926  BP: 106/70  Pulse: 80  Resp: 18  Temp: (!) 97.5 F (36.4 C)     Body mass index is 46.69 kg/m.    ECOG FS:1 - Symptomatic but completely ambulatory  General: Well-developed, well-nourished, no acute distress. HEENT: Clear oropharynx without ulcerations.   Eyes: Pink conjunctiva, anicteric sclera. Lungs: Clear to  auscultation bilaterally. Heart: Regular rate and rhythm. No rubs, murmurs, or gallops. Abdomen: Soft, nontender, nondistended. No organomegaly noted, normoactive bowel sounds. Musculoskeletal: No edema, cyanosis, or clubbing. Neuro: Alert, answering all questions appropriately. Cranial nerves grossly intact. Skin: No rashes or petechiae noted. Psych: Normal affect.   LAB RESULTS:  Lab Results  Component Value Date   NA 140 02/26/2018   K 4.3 02/26/2018   CL 106 02/26/2018   CO2 26 02/26/2018   GLUCOSE 129 (H) 02/26/2018   BUN 11 02/26/2018   CREATININE 0.85 02/26/2018   CALCIUM 8.3 (L) 02/26/2018   PROT 6.7 02/26/2018   ALBUMIN 3.1 (L) 02/26/2018   AST 27 02/26/2018   ALT 16 02/26/2018   ALKPHOS 76 02/26/2018   BILITOT 0.8 02/26/2018   GFRNONAA >60 02/26/2018   GFRAA >60 02/26/2018    Lab Results  Component Value Date   WBC 3.1 (L) 02/26/2018   NEUTROABS 1.8 02/26/2018   HGB 8.4 (L) 02/26/2018   HCT 24.5 (L) 02/26/2018   MCV 93.6 02/26/2018   PLT 196 02/26/2018   Lab Results  Component Value Date   IRON 42 07/31/2017   TIBC 280  07/31/2017   IRONPCTSAT 15 07/31/2017    Lab Results  Component Value Date   FERRITIN 81 07/31/2017     STUDIES: Ct Head Wo Contrast  Result Date: 02/15/2018 CLINICAL DATA:  Dizziness and left-sided weakness. History of lung carcinoma EXAM: CT HEAD WITHOUT CONTRAST TECHNIQUE: Contiguous axial images were obtained from the base of the skull through the vertex without intravenous contrast. COMPARISON:  Brain MRI Apr 27, 2017 FINDINGS: Brain: The ventricles are normal in size and configuration. 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 There is no intracranial mass, hemorrhage, extra-axial fluid collection, or midline shift. There is mild small vessel disease in the centra semiovale bilaterally. Elsewhere gray-white compartments appear  normal. No acute infarct evident. Vascular: No hyperdense vessel. There is calcification in each carotid siphon region. Skull: Bony calvarium appears intact. Sinuses/Orbits: There is mucosal thickening in several ethmoid air cells. There is a benign osteoma at the right frontal-right ethmoid junction measuring 1.3 x 1.2 cm. Other visualized paranasal sinuses are clear. Visualized orbits appear symmetric bilaterally. Other: Mastoid air cells are clear. IMPRESSION: 1. Chiari I malformation. Note that the cerebellar tonsils impress upon the upper cervical cord. This finding may have clinical significance and warrants clinical assessment in this regard. 2. Mild periventricular small vessel disease. No acute infarct. No mass or hemorrhage. 3.  There are foci of arterial vascular calcification. 4.  There is mucosal thickening in several ethmoid air cells. Electronically Signed   By: Lowella Grip III M.D.   On: 02/15/2018 14:07    Oncology treatment history:  Patient completed her initial treatment with concurrent chemotherapy and XRT on March 07, 2016. She then underwent 2 cycles of consolidation carboplatinum and Taxol completing on Apr 27, 2016.  CT scan results from Apr 18, 2017 revealed progression of disease. She subsequently received additional XRT and proceeded with nivolumab every 2 weeks.  She completed a total of 13 infusions of nivolumab on November 06, 2017.  PET scan on November 30, 2017 revealed progression of disease.  Patient started carboplatinum and gemcitabine on December 11, 2017.  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 PET scan results from November 30, 2017 reviewed independently with new enlarged subcarinal lymph node as well as progression of disease in her left upper lobe.  Proceed with cycle 4, day 1 of carboplatinum and  gemcitabine today.  Previously, cycle 2, day 8 was skipped secondary to persistent thrombocytopenia. Gemcitabine has been dose reduced 20% and carboplatinum dose reduced to AUC 4.  Return to clinic in 1 week for further evaluation and consideration of cycle 4, day 8.  Gemcitabine only.   Will reimage with PET scan 4 weeks after the conclusion of cycle 4.  She was then follow-up 1-2 days after her PET scan for further evaluation and consideration of cycle 5, day 1 if necessary. 2. Headaches/dizziness: MRI of the brain reviewed independently with no obvious metastatic disease. 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 Okay to proceed from an oncology standpoint.  Continue follow-up with neurosurgery as indicated.  Patient reports her next appointment is in ~3 months. 3. Anemia: Decreased but stable. Previously iron stores were within normal limits, monitor.  Patient was given a referral to GI for further  evaluation.  Her appointment is this afternoon.   4. Thrombocytopenia: Resolved.  Proceed with dose reduce carboplatinum and gemcitabine as above.   5. Hyperglycemia: Continue diabetic medications as prescribed.  Appreciate endocrinology input.   6. Pain: Patient does not complain of this today.  Patient was given a refill of oxycodone today. 7. Thyroid: Patient's symptoms have significantly improved since increasing her Synthroid dose. Appreciate endocrinology input who will now be monitoring and adjusting her medications as needed. 8.  Blurry vision: Patient reports she has appointment with ophthalmology in the near future.  Patient expressed understanding and was in agreement with this plan. She also understands that She can call clinic at any time with any questions, concerns, or complaints.    [name], MD   02/26/2018 10:03 AM

## 2018-02-26 ENCOUNTER — Other Ambulatory Visit: Payer: Self-pay | Admitting: *Deleted

## 2018-02-26 ENCOUNTER — Other Ambulatory Visit: Payer: Self-pay | Admitting: Oncology

## 2018-02-26 ENCOUNTER — Inpatient Hospital Stay: Payer: PPO

## 2018-02-26 ENCOUNTER — Ambulatory Visit: Payer: PPO | Admitting: Gastroenterology

## 2018-02-26 ENCOUNTER — Inpatient Hospital Stay (HOSPITAL_BASED_OUTPATIENT_CLINIC_OR_DEPARTMENT_OTHER): Payer: PPO | Admitting: Oncology

## 2018-02-26 ENCOUNTER — Encounter: Payer: Self-pay | Admitting: Oncology

## 2018-02-26 ENCOUNTER — Encounter: Payer: Self-pay | Admitting: Gastroenterology

## 2018-02-26 VITALS — BP 106/70 | HR 80 | Temp 97.5°F | Resp 18 | Wt 272.0 lb

## 2018-02-26 VITALS — BP 105/68 | HR 86 | Ht 64.0 in | Wt 276.2 lb

## 2018-02-26 DIAGNOSIS — C3490 Malignant neoplasm of unspecified part of unspecified bronchus or lung: Secondary | ICD-10-CM

## 2018-02-26 DIAGNOSIS — C3412 Malignant neoplasm of upper lobe, left bronchus or lung: Secondary | ICD-10-CM

## 2018-02-26 DIAGNOSIS — D539 Nutritional anemia, unspecified: Secondary | ICD-10-CM | POA: Diagnosis not present

## 2018-02-26 DIAGNOSIS — Z5111 Encounter for antineoplastic chemotherapy: Secondary | ICD-10-CM | POA: Diagnosis not present

## 2018-02-26 DIAGNOSIS — D61810 Antineoplastic chemotherapy induced pancytopenia: Secondary | ICD-10-CM | POA: Diagnosis not present

## 2018-02-26 DIAGNOSIS — D649 Anemia, unspecified: Secondary | ICD-10-CM

## 2018-02-26 DIAGNOSIS — E1165 Type 2 diabetes mellitus with hyperglycemia: Secondary | ICD-10-CM

## 2018-02-26 LAB — CBC WITH DIFFERENTIAL/PLATELET
BASOS ABS: 0 10*3/uL (ref 0–0.1)
BASOS PCT: 1 %
EOS ABS: 0 10*3/uL (ref 0–0.7)
EOS PCT: 1 %
HCT: 24.5 % — ABNORMAL LOW (ref 35.0–47.0)
Hemoglobin: 8.4 g/dL — ABNORMAL LOW (ref 12.0–16.0)
Lymphocytes Relative: 20 %
Lymphs Abs: 0.6 10*3/uL — ABNORMAL LOW (ref 1.0–3.6)
MCH: 32.1 pg (ref 26.0–34.0)
MCHC: 34.3 g/dL (ref 32.0–36.0)
MCV: 93.6 fL (ref 80.0–100.0)
MONO ABS: 0.6 10*3/uL (ref 0.2–0.9)
Monocytes Relative: 20 %
NEUTROS ABS: 1.8 10*3/uL (ref 1.4–6.5)
Neutrophils Relative %: 58 %
PLATELETS: 196 10*3/uL (ref 150–440)
RBC: 2.62 MIL/uL — ABNORMAL LOW (ref 3.80–5.20)
RDW: 18.2 % — ABNORMAL HIGH (ref 11.5–14.5)
WBC: 3.1 10*3/uL — ABNORMAL LOW (ref 3.6–11.0)

## 2018-02-26 LAB — SAMPLE TO BLOOD BANK

## 2018-02-26 LAB — COMPREHENSIVE METABOLIC PANEL
ALBUMIN: 3.1 g/dL — AB (ref 3.5–5.0)
ALK PHOS: 76 U/L (ref 38–126)
ALT: 16 U/L (ref 14–54)
ANION GAP: 8 (ref 5–15)
AST: 27 U/L (ref 15–41)
BILIRUBIN TOTAL: 0.8 mg/dL (ref 0.3–1.2)
BUN: 11 mg/dL (ref 6–20)
CALCIUM: 8.3 mg/dL — AB (ref 8.9–10.3)
CO2: 26 mmol/L (ref 22–32)
CREATININE: 0.85 mg/dL (ref 0.44–1.00)
Chloride: 106 mmol/L (ref 101–111)
GFR calc Af Amer: >60 mL/min (ref >60)
GFR calc non Af Amer: >60 mL/min (ref >60)
GLUCOSE: 129 mg/dL — AB (ref 65–99)
Potassium: 4.3 mmol/L (ref 3.5–5.1)
Sodium: 140 mmol/L (ref 135–145)
TOTAL PROTEIN: 6.7 g/dL (ref 6.5–8.1)

## 2018-02-26 LAB — IRON AND TIBC
Iron: 66 ug/dL (ref 28–170)
Saturation Ratios: 23 % (ref 10.4–31.8)
TIBC: 289 ug/dL (ref 250–450)
UIBC: 223 ug/dL

## 2018-02-26 LAB — FOLATE: Folate: 9.5 ng/mL (ref >5.9)

## 2018-02-26 LAB — FERRITIN: Ferritin: 178 ng/mL (ref 11–307)

## 2018-02-26 LAB — VITAMIN B12: VITAMIN B 12: 750 pg/mL (ref 180–914)

## 2018-02-26 MED ORDER — OXYCODONE HCL 5 MG PO TABS: 5.0000 mg | ORAL_TABLET | Freq: Four times a day (QID) | ORAL | 0 refills | Status: DC | PRN

## 2018-02-26 MED ORDER — LIDOCAINE-PRILOCAINE 2.5-2.5 % EX CREA: TOPICAL_CREAM | CUTANEOUS | 6 refills | Status: AC

## 2018-02-26 MED ORDER — SODIUM CHLORIDE 0.9 % IV SOLN: 10.0000 mg | Freq: Once | INTRAVENOUS | Status: DC

## 2018-02-26 MED ORDER — SODIUM CHLORIDE 0.9% FLUSH
10.0000 mL | INTRAVENOUS | Status: DC | PRN
  Administered 2018-02-26: 10 mL via INTRAVENOUS
  Filled 2018-02-26: qty 10

## 2018-02-26 MED ORDER — HEPARIN SOD (PORK) LOCK FLUSH 100 UNIT/ML IV SOLN
500.0000 [IU] | Freq: Once | INTRAVENOUS | Status: DC | PRN
  Filled 2018-02-26: qty 5

## 2018-02-26 MED ORDER — SODIUM CHLORIDE 0.9 % IV SOLN: 1800.0000 mg | Freq: Once | INTRAVENOUS | Status: DC

## 2018-02-26 MED ORDER — SODIUM CHLORIDE 0.9 % IV SOLN
1800.0000 mg | Freq: Once | INTRAVENOUS | Status: AC
  Administered 2018-02-26: 1800 mg via INTRAVENOUS
  Filled 2018-02-26: qty 26.3

## 2018-02-26 MED ORDER — ALBUTEROL SULFATE HFA 108 (90 BASE) MCG/ACT IN AERS: 2.0000 | INHALATION_SPRAY | Freq: Four times a day (QID) | RESPIRATORY_TRACT | 2 refills | Status: AC | PRN

## 2018-02-26 MED ORDER — SODIUM CHLORIDE 0.9 % IV SOLN
Freq: Once | INTRAVENOUS | Status: AC
  Administered 2018-02-26: 10:00:00 via INTRAVENOUS
  Filled 2018-02-26: qty 1000

## 2018-02-26 MED ORDER — HEPARIN SOD (PORK) LOCK FLUSH 100 UNIT/ML IV SOLN
500.0000 [IU] | Freq: Once | INTRAVENOUS | Status: AC
  Administered 2018-02-26: 500 [IU] via INTRAVENOUS

## 2018-02-26 MED ORDER — SODIUM CHLORIDE 0.9 % IV SOLN
500.0000 mg | Freq: Once | INTRAVENOUS | Status: AC
  Administered 2018-02-26: 500 mg via INTRAVENOUS
  Filled 2018-02-26: qty 50

## 2018-02-26 MED ORDER — DEXAMETHASONE SODIUM PHOSPHATE 10 MG/ML IJ SOLN
10.0000 mg | Freq: Once | INTRAMUSCULAR | Status: AC
  Administered 2018-02-26: 10 mg via INTRAVENOUS
  Filled 2018-02-26: qty 1

## 2018-02-26 MED ORDER — PALONOSETRON HCL INJECTION 0.25 MG/5ML
0.2500 mg | Freq: Once | INTRAVENOUS | Status: AC
  Administered 2018-02-26: 0.25 mg via INTRAVENOUS
  Filled 2018-02-26: qty 5

## 2018-02-26 NOTE — Progress Notes (Signed)
Jonathon Bellows MD, MRCP(U.K) [address]  [place]  [place], [place] [address]  Main: [phone]  Fax: [phone]   Gastroenterology Consultation  Referring Provider:     Jacquelin Hawking, NP Primary Care Physician:  [name], MD Primary Gastroenterologist:  Dr. Jonathon Bellows  Reason for Consultation:     Anemia         HPI:   Susan Willis is a 67 y.o. y/o female referred for consultation & management  by Dr. Grayland Ormond, Kathlene November, MD.     H/o lung cancer  , per last PET scan has progression of  disease , undergoing chemotherapy . Stable anemia with normal iron studies. 4 months back her Hb was around 10 grams and now dropped to around 6.7 grams .    Her chemotherapy was restarted in 01/2018 . She has not seen any blood , no rectal bleeding , normal bowel movements, no hematemesis. No blood in the urine. Stool is grey in color. Not black .  Past Medical History:  Diagnosis Date  . Arthritis   . Blood dyscrasia   . Cancer of left lung ([place]) 08/2015   Rad + chemo tx's.  . Diabetes mellitus without complication ([place])   . Diabetic neuropathy ([place])   . High cholesterol   . Hypertension   . Insulin dependent diabetes mellitus ([place])     Past Surgical History:  Procedure Laterality Date  . ABDOMINAL HYSTERECTOMY    . CESAREAN SECTION    . ELECTROMAGNETIC NAVIGATION BROCHOSCOPY N/A 11/26/2015   Procedure: ELECTROMAGNETIC NAVIGATION BRONCHOSCOPY;  Surgeon: Flora Lipps, MD;  Location: [facility];  Service: Cardiopulmonary;  Laterality: N/A;  . ENDOBRONCHIAL ULTRASOUND N/A 11/26/2015   Procedure: ENDOBRONCHIAL ULTRASOUND;  Surgeon: Flora Lipps, MD;  Location: [facility];  Service: Cardiopulmonary;  Laterality: N/A;  . PERIPHERAL VASCULAR CATHETERIZATION N/A 12/09/2015   Procedure: Glori Luis Cath Insertion;  Surgeon: Katha Cabal, MD;  Location: [facility];  Service: Cardiovascular;  Laterality: N/A;    Prior to Admission medications   Medication Sig Start  Date End Date Taking? Authorizing Provider  albuterol (PROVENTIL HFA;VENTOLIN HFA) 108 (90 Base) MCG/ACT inhaler Inhale 2 puffs into the lungs every 6 (six) hours as needed for wheezing or shortness of breath. 02/26/18   Grayland Ormond, Kathlene November, MD  atorvastatin (LIPITOR) 20 MG tablet Take 20 mg by mouth daily at 6 PM.    [provider]  gabapentin (NEURONTIN) 300 MG capsule Take 1 capsule (300 mg total) by mouth at bedtime. Patient not taking: Reported on 02/05/2018 12/11/17   [name], MD  insulin glargine (LANTUS) 100 UNIT/ML injection Inject 0.52 mLs (52 Units total) into the skin daily. 02/17/18   Loletha Grayer, MD  levothyroxine (SYNTHROID, LEVOTHROID) 150 MCG tablet Take 200 mcg by mouth daily before breakfast.  10/23/17   [provider]  lidocaine-prilocaine (EMLA) cream Apply to affected area once 02/26/18   [name], MD  magic mouthwash w/lidocaine SOLN Take 5 mLs by mouth 4 (four) times daily. 01/15/18   [name], MD  oxyCODONE (OXY IR/ROXICODONE) 5 MG immediate release tablet Take 1 tablet (5 mg total) by mouth every 6 (six) hours as needed for severe pain. 02/26/18   [name], MD  polyethylene glycol (MIRALAX / Floria Raveling) packet Take 17 g by mouth daily as needed for mild constipation. 02/17/18   Loletha Grayer, MD  prochlorperazine (COMPAZINE) 10 MG tablet Take 1 tablet (10 mg total) by  mouth every 6 (six) hours as needed (Nausea or vomiting). 12/04/17   [name], MD    No family history on file.   Social History   Tobacco Use  . Smoking status: Current Every Day Smoker    Packs/day: 0.30    Years: 35.00    Pack years: 10.50    Types: Cigarettes  . Smokeless tobacco: Never Used  . Tobacco comment: workiing on quitting, down to 2 packs per week now  Substance Use Topics  . Alcohol use: No    Alcohol/week: 0.0 oz  . Drug use: No    Allergies as of 02/26/2018  . (No Known Allergies)    Review of Systems:     All systems reviewed and negative except where noted in HPI.   Physical Exam:  There were no vitals taken for this visit. No LMP recorded. Patient has had a hysterectomy. Psych:  Alert and cooperative. Normal mood and affect. General:   Alert,  Well-developed, well-nourished, pleasant and cooperative in NAD Head:  Normocephalic and atraumatic. Eyes:  Sclera clear, no icterus.   Conjunctiva pink. Ears:  Normal auditory acuity. Nose:  No deformity, discharge, or lesions. Mouth:  No deformity or lesions,oropharynx pink & moist. Neck:  Supple; no masses or thyromegaly. Lungs:  Respirations even and unlabored.  Clear throughout to auscultation.   No wheezes, crackles, or rhonchi. No acute distress. Heart:  Regular rate and rhythm; no murmurs, clicks, rubs, or gallops. Abdomen:  Normal bowel sounds.  No bruits.  Soft, non-tender and non-distended without masses, hepatosplenomegaly or hernias noted.  No guarding or rebound tenderness.    Neurologic:  Alert and oriented x3;  grossly normal neurologically. Skin:  Intact without significant lesions or rashes. No jaundice. Lymph Nodes:  No significant cervical adenopathy. Psych:  Alert and cooperative. Normal mood and affect.  Imaging Studies: Ct Head Wo Contrast  Result Date: 02/15/2018 CLINICAL DATA:  Dizziness and left-sided weakness. History of lung carcinoma EXAM: CT HEAD WITHOUT CONTRAST TECHNIQUE: Contiguous axial images were obtained from the base of the skull through the vertex without intravenous contrast. COMPARISON:  Brain MRI Apr 27, 2017 FINDINGS: Brain: The ventricles are normal in size and configuration. 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 There is no intracranial mass, hemorrhage, extra-axial fluid collection, or midline shift. There is mild small vessel disease in the centra semiovale bilaterally. Elsewhere gray-white  compartments appear normal. No acute infarct evident. Vascular: No hyperdense vessel. There is calcification in each carotid siphon region. Skull: Bony calvarium appears intact. Sinuses/Orbits: There is mucosal thickening in several ethmoid air cells. There is a benign osteoma at the right frontal-right ethmoid junction measuring 1.3 x 1.2 cm. Other visualized paranasal sinuses are clear. Visualized orbits appear symmetric bilaterally. Other: Mastoid air cells are clear. IMPRESSION: 1. Chiari I malformation. Note that the cerebellar tonsils impress upon the upper cervical cord. This finding may have clinical significance and warrants clinical assessment in this regard. 2. Mild periventricular small vessel disease. No acute infarct. No mass or hemorrhage. 3.  There are foci of arterial vascular calcification. 4.  There is mucosal thickening in several ethmoid air cells. Electronically Signed   By: Lowella Grip III M.D.   On: 02/15/2018 14:07    Assessment and Plan:   Susan Willis is a 67 y.o. y/o female has been referred for normocytic anemia with normal iron studies. She is  presently undergoing chemotherapy for lung cancer with carboplatinum and gemcitabine , very likely the chemotherapy is causing the anemia, There is no overt GI bleeding and no GI symptoms. In addition the drop in Hb corresponds with the initiation of her chemotherapy. She insists on the procedures (EGD+colonoscopy) , I will have it scheduled. I will discuss with Dr Grayland Ormond for his thoughts.    Follow up in 4 weeks  Dr Jonathon Bellows MD,MRCP(U.K)

## 2018-02-26 NOTE — Progress Notes (Signed)
Pt in for follow up and possible  treatment today.

## 2018-02-26 NOTE — Progress Notes (Signed)
Confirmed with MD doses: Gemzar 800mg /m2 and reduce Carbo to AUC 4

## 2018-03-05 ENCOUNTER — Inpatient Hospital Stay (HOSPITAL_BASED_OUTPATIENT_CLINIC_OR_DEPARTMENT_OTHER): Payer: PPO | Admitting: Oncology

## 2018-03-05 ENCOUNTER — Inpatient Hospital Stay: Payer: PPO

## 2018-03-05 ENCOUNTER — Inpatient Hospital Stay: Payer: PPO | Attending: Oncology

## 2018-03-05 VITALS — BP 111/68 | HR 84 | Temp 96.9°F | Resp 18 | Wt 267.0 lb

## 2018-03-05 DIAGNOSIS — R739 Hyperglycemia, unspecified: Secondary | ICD-10-CM | POA: Insufficient documentation

## 2018-03-05 DIAGNOSIS — E039 Hypothyroidism, unspecified: Secondary | ICD-10-CM | POA: Diagnosis not present

## 2018-03-05 DIAGNOSIS — R42 Dizziness and giddiness: Secondary | ICD-10-CM | POA: Insufficient documentation

## 2018-03-05 DIAGNOSIS — Z452 Encounter for adjustment and management of vascular access device: Secondary | ICD-10-CM | POA: Insufficient documentation

## 2018-03-05 DIAGNOSIS — D696 Thrombocytopenia, unspecified: Secondary | ICD-10-CM | POA: Insufficient documentation

## 2018-03-05 DIAGNOSIS — R51 Headache: Secondary | ICD-10-CM | POA: Diagnosis not present

## 2018-03-05 DIAGNOSIS — C3412 Malignant neoplasm of upper lobe, left bronchus or lung: Secondary | ICD-10-CM

## 2018-03-05 DIAGNOSIS — Z5111 Encounter for antineoplastic chemotherapy: Secondary | ICD-10-CM | POA: Insufficient documentation

## 2018-03-05 DIAGNOSIS — D649 Anemia, unspecified: Secondary | ICD-10-CM

## 2018-03-05 DIAGNOSIS — E1165 Type 2 diabetes mellitus with hyperglycemia: Secondary | ICD-10-CM | POA: Insufficient documentation

## 2018-03-05 DIAGNOSIS — R531 Weakness: Secondary | ICD-10-CM | POA: Insufficient documentation

## 2018-03-05 DIAGNOSIS — D61811 Other drug-induced pancytopenia: Secondary | ICD-10-CM

## 2018-03-05 LAB — COMPREHENSIVE METABOLIC PANEL
ALK PHOS: 87 U/L (ref 38–126)
ALT: 17 U/L (ref 14–54)
AST: 29 U/L (ref 15–41)
Albumin: 3.6 g/dL (ref 3.5–5.0)
Anion gap: 6 (ref 5–15)
BUN: 18 mg/dL (ref 6–20)
CHLORIDE: 105 mmol/L (ref 101–111)
CO2: 26 mmol/L (ref 22–32)
CREATININE: 1 mg/dL (ref 0.44–1.00)
Calcium: 9.2 mg/dL (ref 8.9–10.3)
GFR calc Af Amer: >60 mL/min (ref >60)
GFR, EST NON AFRICAN AMERICAN: 57 mL/min — AB (ref >60)
Glucose, Bld: 178 mg/dL — ABNORMAL HIGH (ref 65–99)
Potassium: 5 mmol/L (ref 3.5–5.1)
Sodium: 137 mmol/L (ref 135–145)
Total Bilirubin: 0.7 mg/dL (ref 0.3–1.2)
Total Protein: 7.3 g/dL (ref 6.5–8.1)

## 2018-03-05 LAB — SAMPLE TO BLOOD BANK

## 2018-03-05 LAB — CBC WITH DIFFERENTIAL/PLATELET
Basophils Absolute: 0 10*3/uL (ref 0–0.1)
Basophils Relative: 2 %
EOS ABS: 0 10*3/uL (ref 0–0.7)
EOS PCT: 0 %
HCT: 22.8 % — ABNORMAL LOW (ref 35.0–47.0)
Hemoglobin: 8 g/dL — ABNORMAL LOW (ref 12.0–16.0)
LYMPHS ABS: 0.6 10*3/uL — AB (ref 1.0–3.6)
Lymphocytes Relative: 34 %
MCH: 32.6 pg (ref 26.0–34.0)
MCHC: 35.2 g/dL (ref 32.0–36.0)
MCV: 92.7 fL (ref 80.0–100.0)
Monocytes Absolute: 0.1 10*3/uL — ABNORMAL LOW (ref 0.2–0.9)
Monocytes Relative: 6 %
Neutro Abs: 0.9 10*3/uL — ABNORMAL LOW (ref 1.4–6.5)
Neutrophils Relative %: 58 %
PLATELETS: 87 10*3/uL — AB (ref 150–440)
RBC: 2.46 MIL/uL — AB (ref 3.80–5.20)
RDW: 17.7 % — ABNORMAL HIGH (ref 11.5–14.5)
WBC: 1.6 10*3/uL — AB (ref 3.6–11.0)

## 2018-03-05 MED ORDER — HEPARIN SOD (PORK) LOCK FLUSH 100 UNIT/ML IV SOLN
500.0000 [IU] | Freq: Once | INTRAVENOUS | Status: AC
  Administered 2018-03-05: 500 [IU] via INTRAVENOUS

## 2018-03-05 MED ORDER — SODIUM CHLORIDE 0.9% FLUSH
10.0000 mL | INTRAVENOUS | Status: DC | PRN
  Administered 2018-03-05: 10 mL via INTRAVENOUS
  Filled 2018-03-05: qty 10

## 2018-03-05 NOTE — Progress Notes (Signed)
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  INTERVAL HISTORY: Patient returns to clinic today for further evaluation and consideration of cycle 4 date 8 of gemcitabine only. States she has done well since this past cycle. She has not had any additional episodes of extreme weakness, dizziness or strokelike symptoms. She has not required any additional blood transfusions. States, "I feel back to baseline". She continues to have episodes of intermittent dizziness. Peripheral neuropathy remains unchanged. She denies any recent fevers or illnesses. She denies any chest pain, shortness of breath, vomiting, nausea, diarrhea or constipation.she denies any urinary complaints.  REVIEW OF SYSTEMS:   Review of Systems  Constitutional: Positive for malaise/fatigue (chronic). Negative for chills, fever and weight loss.  HENT: Negative for congestion and ear pain.   Eyes: Negative.  Negative for blurred vision and double vision.  Respiratory: Negative.  Negative for cough, sputum production and shortness of breath.   Cardiovascular: Negative.  Negative for chest pain, palpitations and leg swelling.  Gastrointestinal: Negative.  Negative for abdominal pain, constipation, diarrhea, nausea and vomiting.  Genitourinary: Negative for dysuria, frequency and urgency.  Musculoskeletal: Negative for back pain and falls.  Skin: Negative.  Negative for rash.  Neurological: Positive for dizziness (Chronic). Negative for weakness and  headaches.  Endo/Heme/Allergies: Negative.  Does not bruise/bleed easily.  Psychiatric/Behavioral: Negative.  Negative for depression. The patient is not nervous/anxious and does not have insomnia.     As per HPI. Otherwise, a complete review of systems is negative.  PAST MEDICAL HISTORY: Past Medical History:  Diagnosis Date  . Arthritis   . Blood dyscrasia   . Cancer of left lung ([place]) 08/2015   Rad + chemo tx's.  . Diabetes mellitus without complication ([place])   . Diabetic neuropathy ([place])   . High cholesterol   . Hypertension   . Insulin dependent diabetes mellitus ([place])     PAST SURGICAL HISTORY: Past Surgical History:  Procedure Laterality Date  . ABDOMINAL HYSTERECTOMY    . CESAREAN SECTION    . ELECTROMAGNETIC NAVIGATION BROCHOSCOPY N/A 11/26/2015   Procedure: ELECTROMAGNETIC NAVIGATION BRONCHOSCOPY;  Surgeon: Flora Lipps, MD;  Location: [facility];  Service: Cardiopulmonary;  Laterality: N/A;  . ENDOBRONCHIAL ULTRASOUND N/A 11/26/2015   Procedure: ENDOBRONCHIAL ULTRASOUND;  Surgeon: Flora Lipps, MD;  Location: [facility] ORS;  Service: Cardiopulmonary;  Laterality: N/A;  . PERIPHERAL VASCULAR CATHETERIZATION N/A 12/09/2015   Procedure: Glori Luis Cath Insertion;  Surgeon: Katha Cabal, MD;  Location: [place] [facility];  Service: Cardiovascular;  Laterality: N/A;    FAMILY HISTORY: Reviewed and unchanged. No reported history of malignancy or chronic disease.     ADVANCED DIRECTIVES:    HEALTH MAINTENANCE: Social History   Tobacco Use  . Smoking status: Current Every Day Smoker    Packs/day: 0.30    Years: 35.00    Pack years: 10.50    Types: Cigarettes  . Smokeless tobacco: Never Used  . Tobacco comment: workiing on quitting, down to 2 packs per week now  Substance Use Topics  . Alcohol use: No    Alcohol/week:  0.0 oz  . Drug use: No     No Known Allergies  Current Outpatient Medications  Medication Sig Dispense Refill  . albuterol (PROVENTIL HFA;VENTOLIN  HFA) 108 (90 Base) MCG/ACT inhaler Inhale 2 puffs into the lungs every 6 (six) hours as needed for wheezing or shortness of breath. 1 Inhaler 2  . atorvastatin (LIPITOR) 20 MG tablet Take 20 mg by mouth daily at 6 PM.    . gabapentin (NEURONTIN) 300 MG capsule Take 1 capsule (300 mg total) by mouth at bedtime. 30 capsule 2  . insulin glargine (LANTUS) 100 UNIT/ML injection Inject 0.52 mLs (52 Units total) into the skin daily. 10 mL 11  . levothyroxine (SYNTHROID, LEVOTHROID) 150 MCG tablet Take 200 mcg by mouth daily before breakfast.     . lidocaine-prilocaine (EMLA) cream Apply to affected area once 30 g 6  . magic mouthwash w/lidocaine SOLN Take 5 mLs by mouth 4 (four) times daily. 240 mL 1  . oxyCODONE (OXY IR/ROXICODONE) 5 MG immediate release tablet Take 1 tablet (5 mg total) by mouth every 6 (six) hours as needed for severe pain. 30 tablet 0  . polyethylene glycol (MIRALAX / GLYCOLAX) packet Take 17 g by mouth daily as needed for mild constipation. 30 each 0  . prochlorperazine (COMPAZINE) 10 MG tablet Take 1 tablet (10 mg total) by mouth every 6 (six) hours as needed (Nausea or vomiting). 60 tablet 2   No current facility-administered medications for this visit.    Facility-Administered Medications Ordered in Other Visits  Medication Dose Route Frequency Provider Last Rate Last Dose  . heparin lock flush 100 unit/mL  500 Units Intravenous Once [name], MD      . sodium chloride flush (NS) 0.9 % injection 10 mL  10 mL Intravenous PRN [name], MD   10 mL at 03/05/18 0916    OBJECTIVE: Vitals:   03/05/18 0928  BP: 111/68  Pulse: 84  Resp: 18  Temp: (!) 96.9 F (36.1 C)  SpO2: 97%     Body mass index is 45.83 kg/m.    ECOG FS:1 - Symptomatic but completely ambulatory  Physical Exam  Constitutional: She is oriented to person, place, and time and well-developed, well-nourished, and in no distress. Vital signs are normal.  HENT:  Head: Normocephalic and  atraumatic.  Eyes: Pupils are equal, round, and reactive to light.  Neck: Normal range of motion.  Cardiovascular: Normal rate, regular rhythm and normal heart sounds.  No murmur heard. Pulmonary/Chest: Effort normal and breath sounds normal. She has no wheezes.  Abdominal: Soft. Normal appearance and bowel sounds are normal. She exhibits no distension. There is no tenderness.  Musculoskeletal: Normal range of motion. She exhibits no edema.  Neurological: She is alert and oriented to person, place, and time. Gait normal.  Skin: Skin is warm and dry. No rash noted.  Psychiatric: Mood, memory, affect and judgment normal.    LAB RESULTS:  Lab Results  Component Value Date   NA 140 02/26/2018   K 4.3 02/26/2018   CL 106 02/26/2018   CO2 26 02/26/2018   GLUCOSE 129 (H) 02/26/2018   BUN 11 02/26/2018   CREATININE 0.85 02/26/2018   CALCIUM 8.3 (L) 02/26/2018   PROT 6.7 02/26/2018   ALBUMIN 3.1 (L) 02/26/2018   AST 27 02/26/2018   ALT 16 02/26/2018   ALKPHOS 76 02/26/2018   BILITOT 0.8 02/26/2018   GFRNONAA >60 02/26/2018   GFRAA >60 02/26/2018    Lab Results  Component Value Date   WBC 1.6 (L) 03/05/2018   NEUTROABS 0.9 (L) 03/05/2018   HGB 8.0 (L) 03/05/2018   HCT 22.8 (L) 03/05/2018   MCV 92.7 03/05/2018   PLT 87 (L) 03/05/2018   Lab Results  Component Value Date   IRON 66 02/26/2018   TIBC 289 02/26/2018   IRONPCTSAT 23 02/26/2018    Lab Results  Component Value Date   FERRITIN 178 02/26/2018     STUDIES: Ct Head Wo Contrast  Result Date: 02/15/2018 CLINICAL DATA:  Dizziness and left-sided weakness. History of lung carcinoma EXAM: CT HEAD WITHOUT CONTRAST TECHNIQUE: Contiguous axial images were obtained from the base of the skull through the vertex without intravenous contrast. COMPARISON:  Brain MRI Apr 27, 2017 FINDINGS: Brain: The ventricles are normal in size and configuration. 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 There is no intracranial mass, hemorrhage, extra-axial fluid collection, or midline shift. There is mild small vessel disease in the centra semiovale bilaterally. Elsewhere gray-white compartments appear normal. No acute infarct evident. Vascular: No hyperdense vessel. There is calcification in each carotid siphon region. Skull: Bony calvarium appears intact. Sinuses/Orbits: There is mucosal thickening in several ethmoid air cells. There is a benign osteoma at the right frontal-right ethmoid junction measuring 1.3 x 1.2 cm. Other visualized paranasal sinuses are clear. Visualized orbits appear symmetric bilaterally. Other: Mastoid air cells are clear. IMPRESSION: 1. Chiari I malformation. Note that the cerebellar tonsils impress upon the upper cervical cord. This finding may have clinical significance and warrants clinical assessment in this regard. 2. Mild periventricular small vessel disease. No acute infarct. No mass or hemorrhage. 3.  There are foci of arterial vascular calcification. 4.  There is mucosal thickening in several ethmoid air cells. Electronically Signed   By: Lowella Grip III M.D.   On: 02/15/2018 14:07    Oncology treatment history:  Patient completed her initial treatment with concurrent chemotherapy and XRT on March 07, 2016. She then underwent 2 cycles of consolidation carboplatinum and Taxol completing on Apr 27, 2016.  CT scan results from Apr 18, 2017 revealed progression of disease. She subsequently received additional XRT and proceeded with nivolumab every 2 weeks.  She completed a total of 13 infusions of nivolumab on November 06, 2017.  PET scan on November 30, 2017 revealed progression of disease.  Patient started carboplatinum and gemcitabine on December 11, 2017.  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 Stage IIIa squamous cell carcinoma of the upper lobe  of left lung: See oncologic treatment history above. PET scan results from November 30, 2017 reviewed independently with new enlarged subcarinal lymph node as well as progression of disease in her left upper lobe.   Delay treatment for one week due to borderline labs. Per treatment guidelines, patient meets parameters but after discussing neutropenic precautions and her recent hospitalization, she would like to postpone treatment for 1 week. I am in agreement of this. She will return to clinic in 1 week for further evaluation and reconsideration of cycle 4 day 8 gemcitabine only. Patient has PET scan reimage at the end of the month. We can keep this appointment. She will then follow-up a few days after PET scan for results and consideration of cycle 5, day 1. 2. Headaches/dizziness: Patient is to follow up with neurosurgery. Dr. Grayland Ormond has  okayed surgical intervention if needed. 3. Anemia: Hemoglobin continues to trend down. Today it is 8. Denies bleeding. Was seen in GI last week and told this is likely chemiotherapy related. She was encouraged  to make an appointment once her chemotherapy was completed for possible colonoscopy if desired. 4. Thrombocytopenia: platelet count 87 today. Continue to monitor. 5. Hyperglycemia: Continue medications as prescribed by Endocrinology 6. Pain: Patient does not complain of this today.  7. Thyroid: Patient's symptoms have significantly improved since increasing her Synthroid dose. Appreciate endocrinology input who will now be monitoring and adjusting her medications as needed. 8.  Blurry vision: Patient reports she has appointment with ophthalmology in the near future.  Patient expressed understanding and was in agreement with this plan. She also understands that She can call clinic at any time with any questions, concerns, or complaints.    Jacquelin Hawking, NP   03/05/2018 9:34 AM

## 2018-03-05 NOTE — Progress Notes (Signed)
Patient here for follow up with labs and treatment today with Gemzar. She states that she is feeling fairly well and denies having pain at the moment.

## 2018-03-08 DIAGNOSIS — E039 Hypothyroidism, unspecified: Secondary | ICD-10-CM | POA: Diagnosis not present

## 2018-03-08 DIAGNOSIS — E1165 Type 2 diabetes mellitus with hyperglycemia: Secondary | ICD-10-CM | POA: Diagnosis not present

## 2018-03-08 DIAGNOSIS — Z794 Long term (current) use of insulin: Secondary | ICD-10-CM | POA: Diagnosis not present

## 2018-03-09 ENCOUNTER — Telehealth: Payer: Self-pay

## 2018-03-09 NOTE — Telephone Encounter (Signed)
LVM for patient callback.   Calling to advise patient that EGD has been cancelled due to information received from Dr. Grayland Ormond & Dr. Vicente Males.    Jonathon Bellows, MD  [name], MD  Cc: Leontine Locket, CMA        Thank you very much .    Regards   Kiran   Previous Messages    ----- Message -----  From: [name], MD  Sent: 03/08/2018 12:33 PM  To: Jonathon Bellows, MD  Subject: RE: Update                    Thanks Kiran. I think this is likely from chemo. Let me finish up her current treatment round. If her hbg doesn't recover as expected, I'll send her back to you. Sound good?   ----- Message -----  From: Jonathon Bellows, MD  Sent: 03/08/2018 10:56 AM  To: [name], MD  Subject: Update                      Dr Grayland Ormond this was the patient I was trying to get in touch with you .   You treat her for lung cancer and think restarted chemo in 12/2017 . Looks since then her Hb trended down gradually with no overt bleeding .   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   If you feel still requires endoscopy will be happy to proceed .    Thanks for your help.   Stith Mech

## 2018-03-11 ENCOUNTER — Other Ambulatory Visit: Payer: Self-pay | Admitting: Oncology

## 2018-03-11 NOTE — Progress Notes (Signed)
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  INTERVAL HISTORY: Patient returns to clinic today for further evaluation and reconsideration of cycle 4, day 8 of carboplatinum and gemcitabine.  Gemcitabine only today.  She continues to have persistent dizziness, but no other neurologic complaints.  She has chronic weakness and fatigue.  Her peripheral neuropathy is unchanged. She denies any recent fevers or illnesses. She denies any chest pain or shortness of breath. She denies nausea, constipation, diarrhea, or vomiting.  She has no urinary complaints. Patient offers no further specific complaints today.  REVIEW OF SYSTEMS:   Review of Systems  Constitutional: Positive for malaise/fatigue. Negative for fever and weight loss.  HENT: Negative.  Negative for congestion.   Eyes: Positive for blurred vision.  Respiratory: Negative.  Negative for cough, hemoptysis and shortness of breath.   Cardiovascular: Negative.  Negative for chest pain and leg swelling.  Gastrointestinal: Negative.  Negative for abdominal pain.  Genitourinary: Negative.  Negative for flank pain.  Musculoskeletal: Negative.  Negative for joint pain and myalgias.  Skin: Negative.  Negative for rash.  Neurological: Positive for dizziness, sensory change and weakness. Negative for focal weakness and headaches.  Psychiatric/Behavioral: Negative.  Negative for memory loss. The patient is not nervous/anxious.     As per HPI. Otherwise, a complete review  of systems is negative.  PAST MEDICAL HISTORY: Past Medical History:  Diagnosis Date  . Arthritis   . Blood dyscrasia   . Cancer of left lung ([place]) 08/2015   Rad + chemo tx's.  . Diabetes mellitus without complication ([place])   . Diabetic neuropathy ([place])   . High cholesterol   . Hypertension   . Insulin dependent diabetes mellitus ([place])     PAST SURGICAL HISTORY: Past Surgical History:  Procedure Laterality Date  . ABDOMINAL HYSTERECTOMY    . CESAREAN SECTION    . ELECTROMAGNETIC NAVIGATION BROCHOSCOPY N/A 11/26/2015   Procedure: ELECTROMAGNETIC NAVIGATION BRONCHOSCOPY;  Surgeon: Flora Lipps, MD;  Location: [facility];  Service: Cardiopulmonary;  Laterality: N/A;  . ENDOBRONCHIAL ULTRASOUND N/A 11/26/2015   Procedure: ENDOBRONCHIAL ULTRASOUND;  Surgeon: Flora Lipps, MD;  Location: [facility];  Service: Cardiopulmonary;  Laterality: N/A;  . PERIPHERAL VASCULAR CATHETERIZATION N/A 12/09/2015   Procedure: Glori Luis Cath Insertion;  Surgeon: Katha Cabal, MD;  Location: [place] [facility];  Service: Cardiovascular;  Laterality: N/A;    FAMILY HISTORY: Reviewed and unchanged. No reported history of malignancy or chronic disease.     ADVANCED DIRECTIVES:    HEALTH MAINTENANCE: Social History   Tobacco Use  . Smoking status: Current Every Day Smoker    Packs/day: 0.30    Years: 35.00    Pack years: 10.50    Types: Cigarettes  . Smokeless tobacco: Never Used  . Tobacco comment: workiing on quitting, down to 2 packs per week now  Substance Use Topics  . Alcohol use: No    Alcohol/week: 0.0 oz  . Drug use: No     No Known Allergies  Current Outpatient Medications  Medication Sig Dispense Refill  . albuterol (PROVENTIL HFA;VENTOLIN  HFA) 108 (90 Base) MCG/ACT inhaler Inhale 2 puffs into the lungs every 6 (six) hours as needed for wheezing or shortness of breath. 1 Inhaler 2  . atorvastatin (LIPITOR) 20 MG tablet Take 20 mg by mouth daily at 6 PM.    . gabapentin (NEURONTIN)  300 MG capsule Take 1 capsule (300 mg total) by mouth at bedtime. 30 capsule 2  . insulin glargine (LANTUS) 100 UNIT/ML injection Inject 0.52 mLs (52 Units total) into the skin daily. 10 mL 11  . levothyroxine (SYNTHROID, LEVOTHROID) 150 MCG tablet Take 200 mcg by mouth daily before breakfast.     . lidocaine-prilocaine (EMLA) cream Apply to affected area once 30 g 6  . oxyCODONE (OXY IR/ROXICODONE) 5 MG immediate release tablet Take 1 tablet (5 mg total) by mouth every 6 (six) hours as needed for severe pain. 30 tablet 0  . polyethylene glycol (MIRALAX / GLYCOLAX) packet Take 17 g by mouth daily as needed for mild constipation. 30 each 0  . prochlorperazine (COMPAZINE) 10 MG tablet Take 1 tablet (10 mg total) by mouth every 6 (six) hours as needed (Nausea or vomiting). 60 tablet 2  . magic mouthwash w/lidocaine SOLN Take 5 mLs by mouth 4 (four) times daily. 240 mL 1   No current facility-administered medications for this visit.    Facility-Administered Medications Ordered in Other Visits  Medication Dose Route Frequency Provider Last Rate Last Dose  . heparin lock flush 100 unit/mL  500 Units Intravenous Once [name], MD      . heparin lock flush 100 unit/mL  500 Units Intravenous Once [name], MD      . sodium chloride flush (NS) 0.9 % injection 10 mL  10 mL Intravenous Once [name], MD        OBJECTIVE: Vitals:   03/12/18 0938  BP: 101/67  Pulse: 89  Resp: 20  Temp: 97.8 F (36.6 C)     Body mass index is 43.34 kg/m.    ECOG FS:1 - Symptomatic but completely ambulatory  General: Well-developed, well-nourished, no acute distress. HEENT: Clear oropharynx without ulcerations.   Eyes: Pink conjunctiva, anicteric sclera. Lungs: Clear to auscultation bilaterally. Heart: Regular rate and rhythm. No rubs, murmurs, or gallops. Abdomen: Soft, nontender, nondistended. No organomegaly noted, normoactive bowel sounds. Musculoskeletal: No edema, cyanosis,  or clubbing. Neuro: Alert, answering all questions appropriately. Cranial nerves grossly intact. Skin: No rashes or petechiae noted. Psych: Normal affect.   LAB RESULTS:  Lab Results  Component Value Date   NA 137 03/12/2018   K 4.4 03/12/2018   CL 107 03/12/2018   CO2 24 03/12/2018   GLUCOSE 149 (H) 03/12/2018   BUN 27 (H) 03/12/2018   CREATININE 1.07 (H) 03/12/2018   CALCIUM 8.9 03/12/2018   PROT 7.2 03/12/2018   ALBUMIN 3.5 03/12/2018   AST 48 (H) 03/12/2018   ALT 27 03/12/2018   ALKPHOS 92 03/12/2018   BILITOT 0.6 03/12/2018   GFRNONAA 52 (L) 03/12/2018   GFRAA >60 03/12/2018    Lab Results  Component Value Date   WBC 4.3 03/12/2018   NEUTROABS 3.1 03/12/2018   HGB 7.3 (L) 03/12/2018   HCT 21.4 (L) 03/12/2018   MCV 94.5 03/12/2018   PLT 80 (L) 03/12/2018   Lab Results  Component Value Date   IRON 66 02/26/2018   TIBC 289 02/26/2018   IRONPCTSAT 23 02/26/2018    Lab Results  Component Value Date   FERRITIN 178 02/26/2018  STUDIES: Ct Head Wo Contrast  Result Date: 02/15/2018 CLINICAL DATA:  Dizziness and left-sided weakness. History of lung carcinoma EXAM: CT HEAD WITHOUT CONTRAST TECHNIQUE: Contiguous axial images were obtained from the base of the skull through the vertex without intravenous contrast. COMPARISON:  Brain MRI Apr 27, 2017 FINDINGS: Brain: The ventricles are normal in size and configuration. 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 There is no intracranial mass, hemorrhage, extra-axial fluid collection, or midline shift. There is mild small vessel disease in the centra semiovale bilaterally. Elsewhere gray-white compartments appear normal. No acute infarct evident. Vascular: No hyperdense vessel. There is calcification in each carotid siphon region. Skull: Bony calvarium appears intact. Sinuses/Orbits: There is mucosal thickening in several  ethmoid air cells. There is a benign osteoma at the right frontal-right ethmoid junction measuring 1.3 x 1.2 cm. Other visualized paranasal sinuses are clear. Visualized orbits appear symmetric bilaterally. Other: Mastoid air cells are clear. IMPRESSION: 1. Chiari I malformation. Note that the cerebellar tonsils impress upon the upper cervical cord. This finding may have clinical significance and warrants clinical assessment in this regard. 2. Mild periventricular small vessel disease. No acute infarct. No mass or hemorrhage. 3.  There are foci of arterial vascular calcification. 4.  There is mucosal thickening in several ethmoid air cells. Electronically Signed   By: Lowella Grip III M.D.   On: 02/15/2018 14:07    Oncology treatment history:  Patient completed her initial treatment with concurrent chemotherapy and XRT on March 07, 2016. She then underwent 2 cycles of consolidation carboplatinum and Taxol completing on Apr 27, 2016.  CT scan results from Apr 18, 2017 revealed progression of disease. She subsequently received additional XRT and proceeded with nivolumab every 2 weeks.  She completed a total of 13 infusions of nivolumab on November 06, 2017.  PET scan on November 30, 2017 revealed progression of disease.  Patient started carboplatinum and gemcitabine on December 11, 2017.  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 PET scan results from November 30, 2017 reviewed independently with new enlarged subcarinal lymph node as well as progression of disease in her left upper lobe.  Delay cycle 4, day 8 of carboplatinum and gemcitabine today, secondary to persistent thrombocytopenia.  Previously, cycle 2, day 8 was skipped secondary to persistent thrombocytopenia. Gemcitabine has been dose reduced 20% and carboplatinum dose reduced to AUC 4.  Return  to clinic in 1 week for further evaluation and reconsideration of cycle 4, day 8.  Gemcitabine only.   Restaging PET scan has been scheduled for March 29, 2018.   2. Headaches/dizziness: MRI of the brain reviewed independently with no obvious metastatic disease. 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 Okay to proceed from an oncology standpoint.  Continue follow-up with neurosurgery as indicated.  Patient reports her next appointment is in ~3 months. 3. Anemia: Patient's hemoglobin is slowly decreasing, proceed with 1 unit packed red blood cells today.  Will hold GI evaluation until the conclusion of patient's treatments.  4. Thrombocytopenia: Delay treatment as above.  Previously both carboplatinum and gemcitabine have been dose reduced.   5. Hyperglycemia: Continue diabetic medications as prescribed.  Appreciate endocrinology input.   6. Pain: Patient does not complain of this today.  Continue oxycodone as needed. 7. Thyroid: Patient's symptoms have significantly improved since increasing her Synthroid dose. Appreciate endocrinology input who will now be monitoring and adjusting her medications as needed. 8.  Blurry vision: Patient reports she has appointment with ophthalmology in the next 1-2 weeks.  Patient expressed understanding and was in agreement with this plan. She also understands that She can call clinic at any time with any questions, concerns, or complaints.    [name], MD   03/12/2018 12:56 PM

## 2018-03-12 ENCOUNTER — Inpatient Hospital Stay (HOSPITAL_BASED_OUTPATIENT_CLINIC_OR_DEPARTMENT_OTHER): Payer: PPO | Admitting: Oncology

## 2018-03-12 ENCOUNTER — Inpatient Hospital Stay: Payer: PPO | Attending: Oncology

## 2018-03-12 ENCOUNTER — Encounter: Payer: Self-pay | Admitting: Oncology

## 2018-03-12 ENCOUNTER — Inpatient Hospital Stay: Payer: PPO

## 2018-03-12 VITALS — BP 101/67 | HR 89 | Temp 97.8°F | Resp 20 | Wt 252.5 lb

## 2018-03-12 VITALS — BP 118/64 | HR 84 | Temp 96.7°F | Resp 20

## 2018-03-12 DIAGNOSIS — Z029 Encounter for administrative examinations, unspecified: Secondary | ICD-10-CM | POA: Insufficient documentation

## 2018-03-12 DIAGNOSIS — R739 Hyperglycemia, unspecified: Secondary | ICD-10-CM

## 2018-03-12 DIAGNOSIS — R531 Weakness: Secondary | ICD-10-CM | POA: Diagnosis not present

## 2018-03-12 DIAGNOSIS — D696 Thrombocytopenia, unspecified: Secondary | ICD-10-CM

## 2018-03-12 DIAGNOSIS — C3412 Malignant neoplasm of upper lobe, left bronchus or lung: Secondary | ICD-10-CM

## 2018-03-12 DIAGNOSIS — Z5111 Encounter for antineoplastic chemotherapy: Secondary | ICD-10-CM | POA: Diagnosis not present

## 2018-03-12 DIAGNOSIS — R51 Headache: Secondary | ICD-10-CM | POA: Diagnosis not present

## 2018-03-12 DIAGNOSIS — D649 Anemia, unspecified: Secondary | ICD-10-CM

## 2018-03-12 DIAGNOSIS — R42 Dizziness and giddiness: Secondary | ICD-10-CM

## 2018-03-12 LAB — COMPREHENSIVE METABOLIC PANEL
ALT: 27 U/L (ref 14–54)
AST: 48 U/L — ABNORMAL HIGH (ref 15–41)
Albumin: 3.5 g/dL (ref 3.5–5.0)
Alkaline Phosphatase: 92 U/L (ref 38–126)
Anion gap: 6 (ref 5–15)
BUN: 27 mg/dL — ABNORMAL HIGH (ref 6–20)
CHLORIDE: 107 mmol/L (ref 101–111)
CO2: 24 mmol/L (ref 22–32)
Calcium: 8.9 mg/dL (ref 8.9–10.3)
Creatinine, Ser: 1.07 mg/dL — ABNORMAL HIGH (ref 0.44–1.00)
GFR, EST NON AFRICAN AMERICAN: 52 mL/min — AB (ref >60)
Glucose, Bld: 149 mg/dL — ABNORMAL HIGH (ref 65–99)
POTASSIUM: 4.4 mmol/L (ref 3.5–5.1)
Sodium: 137 mmol/L (ref 135–145)
Total Bilirubin: 0.6 mg/dL (ref 0.3–1.2)
Total Protein: 7.2 g/dL (ref 6.5–8.1)

## 2018-03-12 LAB — CBC WITH DIFFERENTIAL/PLATELET
BASOS ABS: 0 10*3/uL (ref 0–0.1)
Basophils Relative: 0 %
EOS ABS: 0 10*3/uL (ref 0–0.7)
EOS PCT: 1 %
HCT: 21.4 % — ABNORMAL LOW (ref 35.0–47.0)
HEMOGLOBIN: 7.3 g/dL — AB (ref 12.0–16.0)
LYMPHS ABS: 0.5 10*3/uL — AB (ref 1.0–3.6)
LYMPHS PCT: 12 %
MCH: 32.4 pg (ref 26.0–34.0)
MCHC: 34.3 g/dL (ref 32.0–36.0)
MCV: 94.5 fL (ref 80.0–100.0)
Monocytes Absolute: 0.5 10*3/uL (ref 0.2–0.9)
Monocytes Relative: 13 %
NEUTROS PCT: 74 %
Neutro Abs: 3.1 10*3/uL (ref 1.4–6.5)
PLATELETS: 80 10*3/uL — AB (ref 150–440)
RBC: 2.27 MIL/uL — AB (ref 3.80–5.20)
RDW: 18.3 % — ABNORMAL HIGH (ref 11.5–14.5)
WBC: 4.3 10*3/uL (ref 3.6–11.0)

## 2018-03-12 LAB — PREPARE RBC (CROSSMATCH)

## 2018-03-12 MED ORDER — HEPARIN SOD (PORK) LOCK FLUSH 100 UNIT/ML IV SOLN
500.0000 [IU] | Freq: Once | INTRAVENOUS | Status: AC
  Administered 2018-03-12: 500 [IU] via INTRAVENOUS
  Filled 2018-03-12: qty 5

## 2018-03-12 MED ORDER — SODIUM CHLORIDE 0.9% FLUSH
10.0000 mL | Freq: Once | INTRAVENOUS | Status: AC
  Filled 2018-03-12: qty 10

## 2018-03-12 MED ORDER — SODIUM CHLORIDE 0.9% FLUSH
10.0000 mL | Freq: Once | INTRAVENOUS | Status: AC
  Administered 2018-03-12: 10 mL via INTRAVENOUS
  Filled 2018-03-12: qty 10

## 2018-03-12 MED ORDER — SODIUM CHLORIDE 0.9 % IV SOLN
250.0000 mL | Freq: Once | INTRAVENOUS | Status: AC
  Administered 2018-03-12: 250 mL via INTRAVENOUS
  Filled 2018-03-12: qty 250

## 2018-03-12 MED ORDER — DIPHENHYDRAMINE HCL 50 MG/ML IJ SOLN
25.0000 mg | Freq: Once | INTRAMUSCULAR | Status: AC
  Administered 2018-03-12: 25 mg via INTRAVENOUS
  Filled 2018-03-12: qty 1

## 2018-03-12 MED ORDER — ACETAMINOPHEN 325 MG PO TABS
650.0000 mg | ORAL_TABLET | Freq: Once | ORAL | Status: AC
  Administered 2018-03-12: 650 mg via ORAL
  Filled 2018-03-12: qty 2

## 2018-03-12 MED ORDER — HEPARIN SOD (PORK) LOCK FLUSH 100 UNIT/ML IV SOLN: 500.0000 [IU] | Freq: Once | INTRAVENOUS | Status: AC

## 2018-03-12 NOTE — Progress Notes (Signed)
Patient reports dizziness this morning.

## 2018-03-13 LAB — TYPE AND SCREEN
ABO/RH(D): O POS
Antibody Screen: NEGATIVE
Unit division: 0

## 2018-03-13 LAB — BPAM RBC
BLOOD PRODUCT EXPIRATION DATE: 201904112359
ISSUE DATE / TIME: 201904081252
Unit Type and Rh: 5100

## 2018-03-18 ENCOUNTER — Other Ambulatory Visit: Payer: Self-pay | Admitting: Oncology

## 2018-03-18 NOTE — Progress Notes (Signed)
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  INTERVAL HISTORY: Patient returns to clinic today for further evaluation and reconsideration of cycle 4, day 8 of treatment which is gemcitabine only.  Her dizziness is unchanged.  She has no other neurologic complaints.  She has chronic weakness and fatigue.  She denies any recent fevers or illnesses. She denies any chest pain, cough, hemoptysis, or shortness of breath. She denies nausea, constipation, diarrhea, or vomiting.  She has no urinary complaints. Patient offers no further specific complaints today.  REVIEW OF SYSTEMS:   Review of Systems  Constitutional: Positive for malaise/fatigue. Negative for fever and weight loss.  HENT: Negative.  Negative for congestion.   Eyes: Positive for blurred vision.  Respiratory: Negative.  Negative for cough, hemoptysis and shortness of breath.   Cardiovascular: Negative.  Negative for chest pain and leg swelling.  Gastrointestinal: Negative.  Negative for abdominal pain and constipation.  Genitourinary: Negative.  Negative for flank pain.  Musculoskeletal: Negative.  Negative for joint pain and myalgias.  Skin: Negative.  Negative for rash.  Neurological: Positive for dizziness, sensory change and weakness. Negative for focal weakness and headaches.  Psychiatric/Behavioral: Negative.  Negative for memory loss. The patient is not nervous/anxious.     As per HPI. Otherwise, a complete review of systems is negative.  PAST  MEDICAL HISTORY: Past Medical History:  Diagnosis Date  . Arthritis   . Blood dyscrasia   . Cancer of left lung ([place]) 08/2015   Rad + chemo tx's.  . Diabetes mellitus without complication ([place])   . Diabetic neuropathy ([place])   . High cholesterol   . Hypertension   . Insulin dependent diabetes mellitus ([place])     PAST SURGICAL HISTORY: Past Surgical History:  Procedure Laterality Date  . ABDOMINAL HYSTERECTOMY    . CESAREAN SECTION    . ELECTROMAGNETIC NAVIGATION BROCHOSCOPY N/A 11/26/2015   Procedure: ELECTROMAGNETIC NAVIGATION BRONCHOSCOPY;  Surgeon: Flora Lipps, MD;  Location: [facility];  Service: Cardiopulmonary;  Laterality: N/A;  . ENDOBRONCHIAL ULTRASOUND N/A 11/26/2015   Procedure: ENDOBRONCHIAL ULTRASOUND;  Surgeon: Flora Lipps, MD;  Location: [facility];  Service: Cardiopulmonary;  Laterality: N/A;  . PERIPHERAL VASCULAR CATHETERIZATION N/A 12/09/2015   Procedure: Glori Luis Cath Insertion;  Surgeon: Katha Cabal, MD;  Location: [facility];  Service: Cardiovascular;  Laterality: N/A;    FAMILY HISTORY: Reviewed and unchanged. No reported history of malignancy or chronic disease.     ADVANCED DIRECTIVES:    HEALTH MAINTENANCE: Social History   Tobacco Use  . Smoking status: Current Every Day Smoker    Packs/day: 0.30    Years: 35.00    Pack years: 10.50    Types: Cigarettes  . Smokeless tobacco: Never Used  . Tobacco comment: workiing on quitting, down to 2 packs per week now  Substance Use Topics  . Alcohol use: No    Alcohol/week: 0.0 oz  . Drug use: No     No Known Allergies  Current Outpatient Medications  Medication Sig Dispense Refill  . albuterol (PROVENTIL HFA;VENTOLIN HFA) 108 (90  Base) MCG/ACT inhaler Inhale 2 puffs into the lungs every 6 (six) hours as needed for wheezing or shortness of breath. 1 Inhaler 2  . atorvastatin (LIPITOR) 20 MG tablet Take 20 mg by mouth daily at 6 PM.    . gabapentin (NEURONTIN) 300 MG capsule Take 1 capsule  (300 mg total) by mouth at bedtime. 30 capsule 2  . insulin glargine (LANTUS) 100 UNIT/ML injection Inject 0.52 mLs (52 Units total) into the skin daily. 10 mL 11  . levothyroxine (SYNTHROID, LEVOTHROID) 150 MCG tablet Take 200 mcg by mouth daily before breakfast.     . lidocaine-prilocaine (EMLA) cream Apply to affected area once 30 g 6  . magic mouthwash w/lidocaine SOLN Take 5 mLs by mouth 4 (four) times daily. 240 mL 1  . polyethylene glycol (MIRALAX / GLYCOLAX) packet Take 17 g by mouth daily as needed for mild constipation. 30 each 0  . prochlorperazine (COMPAZINE) 10 MG tablet Take 1 tablet (10 mg total) by mouth every 6 (six) hours as needed (Nausea or vomiting). 60 tablet 2  . oxyCODONE (OXY IR/ROXICODONE) 5 MG immediate release tablet Take 1 tablet (5 mg total) by mouth every 6 (six) hours as needed for severe pain. 30 tablet 0   No current facility-administered medications for this visit.    Facility-Administered Medications Ordered in Other Visits  Medication Dose Route Frequency Provider Last Rate Last Dose  . gemcitabine (GEMZAR) 1,800 mg in sodium chloride 0.9 % 250 mL chemo infusion  1,800 mg Intravenous Once [name], MD      . heparin lock flush 100 unit/mL  500 Units Intravenous Once [name], MD      . heparin lock flush 100 unit/mL  500 Units Intracatheter Once PRN [name], MD      . sodium chloride flush (NS) 0.9 % injection 10 mL  10 mL Intravenous Once [name], MD        OBJECTIVE: Vitals:   03/19/18 0912 03/19/18 0918  BP:  108/72  Pulse:  88  Resp: 16   Temp:  (!) 97.5 F (36.4 C)     Body mass index is 45.5 kg/m.    ECOG FS:1 - Symptomatic but completely ambulatory  General: Well-developed, well-nourished, no acute distress. Eyes: Pink conjunctiva, anicteric sclera. HEENT: Normocephalic, moist mucous membranes, clear oropharnyx. Lungs: Clear to auscultation bilaterally. Heart: Regular rate and rhythm. No rubs,  murmurs, or gallops. Abdomen: Soft, nontender, nondistended. No organomegaly noted, normoactive bowel sounds. Musculoskeletal: No edema, cyanosis, or clubbing. Neuro: Alert, answering all questions appropriately. Cranial nerves grossly intact. Skin: No rashes or petechiae noted. Psych: Normal affect.  LAB RESULTS:  Lab Results  Component Value Date   NA 139 03/19/2018   K 4.1 03/19/2018   CL 109 03/19/2018   CO2 24 03/19/2018   GLUCOSE 199 (H) 03/19/2018   BUN 17 03/19/2018   CREATININE 0.92 03/19/2018   CALCIUM 8.9 03/19/2018   PROT 7.4 03/19/2018   ALBUMIN 3.3 (L) 03/19/2018   AST 25 03/19/2018   ALT 17 03/19/2018   ALKPHOS 104 03/19/2018   BILITOT 0.5 03/19/2018   GFRNONAA >60 03/19/2018   GFRAA >60 03/19/2018    Lab Results  Component Value Date   WBC 3.3 (L) 03/19/2018   NEUTROABS 2.1 03/19/2018   HGB 8.6 (L) 03/19/2018   HCT 24.7 (L) 03/19/2018   MCV 94.3 03/19/2018   PLT 188 03/19/2018   Lab Results  Component Value Date   IRON  66 02/26/2018   TIBC 289 02/26/2018   IRONPCTSAT 23 02/26/2018    Lab Results  Component Value Date   FERRITIN 178 02/26/2018     STUDIES: No results found.  Oncology treatment history:  Patient completed her initial treatment with concurrent chemotherapy and XRT on March 07, 2016. She then underwent 2 cycles of consolidation carboplatinum and Taxol completing on Apr 27, 2016.  CT scan results from Apr 18, 2017 revealed progression of disease. She subsequently received additional XRT and proceeded with nivolumab every 2 weeks.  She completed a total of 13 infusions of nivolumab on November 06, 2017.  PET scan on November 30, 2017 revealed progression of disease.  Patient started carboplatinum and gemcitabine on December 11, 2017.  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 PET  scan results from November 30, 2017 reviewed independently with new enlarged subcarinal lymph node as well as progression of disease in her left upper lobe.  Proceed with cycle 4, day 8 of carboplatinum and gemcitabine today.  Gemcitabine has been dose reduced 20% and carboplatinum dose reduced to AUC 4.  Patient has a PET scan scheduled for March 29, 2018.  She has been instructed to return to clinic as previously scheduled after her PET scan for discussion of results and additional treatment if necessary.   2. Headaches/dizziness: Chronic.  MRI of the brain reviewed independently with no obvious metastatic disease. 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 Okay to proceed from an oncology standpoint.  Continue follow-up with neurosurgery as indicated.  Patient reports her next appointment is in ~3 months. 3. Anemia: Patient's hemoglobin has improved to 8.6 today after receiving 1 unit of packed red blood cells last week.  She does not require transfusion today.  Will hold GI evaluation until the conclusion of patient's treatments.  4. Thrombocytopenia: Resolved.  Proceed with treatment as above.  Previously both carboplatinum and gemcitabine have been dose reduced.   5. Hyperglycemia: Patient's blood glucose is 199 today.  Continue diabetic medications as prescribed.  Appreciate endocrinology input.   6. Pain: Patient does not complain of this today.  Continue oxycodone as needed. 7.  Hypothyroidism: Continue Synthroid and follow-up with endocrinology as indicated. 8.  Blurry vision: Patient reports she has appointment with ophthalmology in the near future.  Approximately 30 minutes spent in discussion of which greater than 50% was consultation.  Patient expressed understanding and was in agreement with this plan. She also understands that She can call clinic at any time with any questions, concerns, or complaints.    [name], MD   03/19/2018 10:38 AM

## 2018-03-19 ENCOUNTER — Inpatient Hospital Stay: Payer: PPO

## 2018-03-19 ENCOUNTER — Other Ambulatory Visit: Payer: Self-pay | Admitting: *Deleted

## 2018-03-19 ENCOUNTER — Encounter: Payer: Self-pay | Admitting: Oncology

## 2018-03-19 ENCOUNTER — Inpatient Hospital Stay (HOSPITAL_BASED_OUTPATIENT_CLINIC_OR_DEPARTMENT_OTHER): Payer: PPO | Admitting: Oncology

## 2018-03-19 ENCOUNTER — Other Ambulatory Visit: Payer: Self-pay

## 2018-03-19 VITALS — BP 108/72 | HR 88 | Temp 97.5°F | Resp 16 | Ht 64.0 in | Wt 265.1 lb

## 2018-03-19 DIAGNOSIS — R739 Hyperglycemia, unspecified: Secondary | ICD-10-CM

## 2018-03-19 DIAGNOSIS — C3412 Malignant neoplasm of upper lobe, left bronchus or lung: Secondary | ICD-10-CM

## 2018-03-19 DIAGNOSIS — T451X5A Adverse effect of antineoplastic and immunosuppressive drugs, initial encounter: Secondary | ICD-10-CM

## 2018-03-19 DIAGNOSIS — Z5111 Encounter for antineoplastic chemotherapy: Secondary | ICD-10-CM | POA: Diagnosis not present

## 2018-03-19 DIAGNOSIS — G620 Drug-induced polyneuropathy: Secondary | ICD-10-CM

## 2018-03-19 DIAGNOSIS — D649 Anemia, unspecified: Secondary | ICD-10-CM

## 2018-03-19 LAB — CBC WITH DIFFERENTIAL/PLATELET
BASOS PCT: 1 %
Basophils Absolute: 0 10*3/uL (ref 0–0.1)
Eosinophils Absolute: 0.1 10*3/uL (ref 0–0.7)
Eosinophils Relative: 2 %
HCT: 24.7 % — ABNORMAL LOW (ref 35.0–47.0)
Hemoglobin: 8.6 g/dL — ABNORMAL LOW (ref 12.0–16.0)
LYMPHS ABS: 0.6 10*3/uL — AB (ref 1.0–3.6)
Lymphocytes Relative: 17 %
MCH: 32.7 pg (ref 26.0–34.0)
MCHC: 34.7 g/dL (ref 32.0–36.0)
MCV: 94.3 fL (ref 80.0–100.0)
MONOS PCT: 15 %
Monocytes Absolute: 0.5 10*3/uL (ref 0.2–0.9)
NEUTROS ABS: 2.1 10*3/uL (ref 1.4–6.5)
Neutrophils Relative %: 65 %
Platelets: 188 10*3/uL (ref 150–440)
RBC: 2.62 MIL/uL — ABNORMAL LOW (ref 3.80–5.20)
RDW: 17.7 % — ABNORMAL HIGH (ref 11.5–14.5)
WBC: 3.3 10*3/uL — ABNORMAL LOW (ref 3.6–11.0)

## 2018-03-19 LAB — COMPREHENSIVE METABOLIC PANEL
ALBUMIN: 3.3 g/dL — AB (ref 3.5–5.0)
ALK PHOS: 104 U/L (ref 38–126)
ALT: 17 U/L (ref 14–54)
ANION GAP: 6 (ref 5–15)
AST: 25 U/L (ref 15–41)
BUN: 17 mg/dL (ref 6–20)
CALCIUM: 8.9 mg/dL (ref 8.9–10.3)
CO2: 24 mmol/L (ref 22–32)
Chloride: 109 mmol/L (ref 101–111)
Creatinine, Ser: 0.92 mg/dL (ref 0.44–1.00)
GFR calc Af Amer: >60 mL/min (ref >60)
GFR calc non Af Amer: >60 mL/min (ref >60)
GLUCOSE: 199 mg/dL — AB (ref 65–99)
Potassium: 4.1 mmol/L (ref 3.5–5.1)
Sodium: 139 mmol/L (ref 135–145)
TOTAL PROTEIN: 7.4 g/dL (ref 6.5–8.1)
Total Bilirubin: 0.5 mg/dL (ref 0.3–1.2)

## 2018-03-19 LAB — SAMPLE TO BLOOD BANK

## 2018-03-19 MED ORDER — SODIUM CHLORIDE 0.9 % IV SOLN
1800.0000 mg | Freq: Once | INTRAVENOUS | Status: AC
  Administered 2018-03-19: 1800 mg via INTRAVENOUS
  Filled 2018-03-19: qty 21.04

## 2018-03-19 MED ORDER — DEXAMETHASONE SODIUM PHOSPHATE 10 MG/ML IJ SOLN
10.0000 mg | Freq: Once | INTRAMUSCULAR | Status: AC
  Administered 2018-03-19: 10 mg via INTRAVENOUS
  Filled 2018-03-19: qty 1

## 2018-03-19 MED ORDER — SODIUM CHLORIDE 0.9 % IV SOLN
Freq: Once | INTRAVENOUS | Status: AC
  Administered 2018-03-19: 10:00:00 via INTRAVENOUS
  Filled 2018-03-19: qty 1000

## 2018-03-19 MED ORDER — OXYCODONE HCL 5 MG PO TABS: 5.0000 mg | ORAL_TABLET | Freq: Four times a day (QID) | ORAL | 0 refills | Status: DC | PRN

## 2018-03-19 MED ORDER — HEPARIN SOD (PORK) LOCK FLUSH 100 UNIT/ML IV SOLN
500.0000 [IU] | Freq: Once | INTRAVENOUS | Status: AC | PRN
  Administered 2018-03-19: 500 [IU]
  Filled 2018-03-19: qty 5

## 2018-03-19 MED ORDER — PROCHLORPERAZINE MALEATE 10 MG PO TABS
10.0000 mg | ORAL_TABLET | Freq: Once | ORAL | Status: AC
  Administered 2018-03-19: 10 mg via ORAL
  Filled 2018-03-19: qty 1

## 2018-03-19 MED ORDER — GABAPENTIN 300 MG PO CAPS: 300.0000 mg | ORAL_CAPSULE | Freq: Every day | ORAL | 2 refills | Status: DC

## 2018-03-29 ENCOUNTER — Ambulatory Visit
Admission: RE | Admit: 2018-03-29 | Discharge: 2018-03-29 | Disposition: A | Discharge status: Home / Self Care | Payer: PPO | Source: Ambulatory Visit | Attending: Oncology | Admitting: Oncology

## 2018-03-29 DIAGNOSIS — J849 Interstitial pulmonary disease, unspecified: Secondary | ICD-10-CM | POA: Insufficient documentation

## 2018-03-29 DIAGNOSIS — C3490 Malignant neoplasm of unspecified part of unspecified bronchus or lung: Secondary | ICD-10-CM | POA: Diagnosis not present

## 2018-03-29 DIAGNOSIS — R590 Localized enlarged lymph nodes: Secondary | ICD-10-CM | POA: Insufficient documentation

## 2018-03-29 LAB — GLUCOSE, CAPILLARY: GLUCOSE-CAPILLARY: 122 mg/dL — AB (ref 65–99)

## 2018-03-29 MED ORDER — FLUDEOXYGLUCOSE F - 18 (FDG) INJECTION
13.6400 | Freq: Once | INTRAVENOUS | Status: AC | PRN
  Administered 2018-03-29: 13.64 via INTRAVENOUS

## 2018-04-01 NOTE — Progress Notes (Signed)
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  INTERVAL HISTORY: Patient returns to clinic today for further evaluation, discussion of her imaging results, and treatment planning.  She continues to feel well and at her baseline. Her dizziness is unchanged.  She has no other neurologic complaints. She has chronic weakness and fatigue which is unchanged.  She denies any recent fevers or illnesses. She denies any chest pain, cough, hemoptysis, or shortness of breath. She denies nausea, constipation, diarrhea, or vomiting.  She has no urinary complaints.  Patient offers no further specific complaints today.  REVIEW OF SYSTEMS:   Review of Systems  Constitutional: Positive for malaise/fatigue. Negative for fever and weight loss.  HENT: Negative.  Negative for congestion.   Eyes: Negative.  Negative for blurred vision.  Respiratory: Negative.  Negative for cough, hemoptysis and shortness of breath.   Cardiovascular: Negative.  Negative for chest pain and leg swelling.  Gastrointestinal: Negative.  Negative for abdominal pain and constipation.  Genitourinary: Negative.  Negative for flank pain.  Musculoskeletal: Negative.  Negative for joint pain and myalgias.  Skin: Negative.  Negative for rash.  Neurological: Positive for dizziness, sensory change and weakness. Negative for focal weakness and headaches.  Psychiatric/Behavioral: Negative.  Negative for memory loss. The patient is not nervous/anxious.     As per HPI.  Otherwise, a complete review of systems is negative.  PAST MEDICAL HISTORY: Past Medical History:  Diagnosis Date  . Arthritis   . Blood dyscrasia   . Cancer of left lung ([place]) 08/2015   Rad + chemo tx's.  . Diabetes mellitus without complication ([place])   . Diabetic neuropathy ([place])   . High cholesterol   . Hypertension   . Insulin dependent diabetes mellitus ([place])     PAST SURGICAL HISTORY: Past Surgical History:  Procedure Laterality Date  . ABDOMINAL HYSTERECTOMY    . CESAREAN SECTION    . ELECTROMAGNETIC NAVIGATION BROCHOSCOPY N/A 11/26/2015   Procedure: ELECTROMAGNETIC NAVIGATION BRONCHOSCOPY;  Surgeon: Flora Lipps, MD;  Location: [facility];  Service: Cardiopulmonary;  Laterality: N/A;  . ENDOBRONCHIAL ULTRASOUND N/A 11/26/2015   Procedure: ENDOBRONCHIAL ULTRASOUND;  Surgeon: Flora Lipps, MD;  Location: [facility];  Service: Cardiopulmonary;  Laterality: N/A;  . PERIPHERAL VASCULAR CATHETERIZATION N/A 12/09/2015   Procedure: Glori Luis Cath Insertion;  Surgeon: Katha Cabal, MD;  Location: [facility];  Service: Cardiovascular;  Laterality: N/A;    FAMILY HISTORY: Reviewed and unchanged. No reported history of malignancy or chronic disease.     ADVANCED DIRECTIVES:    HEALTH MAINTENANCE: Social History   Tobacco Use  . Smoking status: Current Every Day Smoker    Packs/day: 0.30    Years: 35.00    Pack years: 10.50    Types: Cigarettes  . Smokeless tobacco: Never Used  . Tobacco comment: workiing on quitting, down to 2 packs per week now  Substance Use Topics  . Alcohol use: No    Alcohol/week: 0.0 oz  . Drug use: No     No Known Allergies  Current Outpatient Medications  Medication Sig Dispense  Refill  . albuterol (PROVENTIL HFA;VENTOLIN HFA) 108 (90 Base) MCG/ACT inhaler Inhale 2 puffs into the lungs every 6 (six) hours as needed for wheezing or shortness of breath. 1 Inhaler 2  . atorvastatin (LIPITOR) 20 MG tablet Take 20 mg by mouth daily at 6 PM.     . gabapentin (NEURONTIN) 300 MG capsule Take 1 capsule (300 mg total) by mouth at bedtime. 30 capsule 2  . insulin glargine (LANTUS) 100 UNIT/ML injection Inject 0.52 mLs (52 Units total) into the skin daily. 10 mL 11  . levothyroxine (SYNTHROID, LEVOTHROID) 150 MCG tablet Take 200 mcg by mouth daily before breakfast.     . lidocaine-prilocaine (EMLA) cream Apply to affected area once 30 g 6  . lisinopril (PRINIVIL,ZESTRIL) 10 MG tablet Take 10 mg by mouth daily.    Marland Kitchen oxyCODONE (OXY IR/ROXICODONE) 5 MG immediate release tablet Take 1 tablet (5 mg total) by mouth every 6 (six) hours as needed for severe pain. 30 tablet 0  . polyethylene glycol (MIRALAX / GLYCOLAX) packet Take 17 g by mouth daily as needed for mild constipation. 30 each 0  . magic mouthwash w/lidocaine SOLN Take 5 mLs by mouth 4 (four) times daily. (Patient not taking: Reported on 04/02/2018) 240 mL 1   No current facility-administered medications for this visit.    Facility-Administered Medications Ordered in Other Visits  Medication Dose Route Frequency Provider Last Rate Last Dose  . heparin lock flush 100 unit/mL  500 Units Intravenous Once [name], MD      . sodium chloride flush (NS) 0.9 % injection 10 mL  10 mL Intravenous Once [name], MD        OBJECTIVE: Vitals:   04/02/18 0935 04/02/18 0940  BP: 114/74   Pulse: 91   Resp: 18   Temp: (!) 96.2 F (35.7 C)   SpO2:  98%     Body mass index is 44.87 kg/m.    ECOG FS:1 - Symptomatic but completely ambulatory  General: Well-developed, well-nourished, no acute distress. Eyes: Pink conjunctiva, anicteric sclera. Lungs: Clear to auscultation bilaterally. Heart: Regular rate and rhythm. No rubs, murmurs, or gallops. Abdomen: Soft, nontender, nondistended. No organomegaly noted, normoactive bowel sounds. Musculoskeletal: No edema, cyanosis, or clubbing. Neuro: Alert, answering all questions appropriately. Cranial nerves grossly  intact. Skin: No rashes or petechiae noted. Psych: Normal affect.  LAB RESULTS:  Lab Results  Component Value Date   NA 138 04/02/2018   K 4.0 04/02/2018   CL 108 04/02/2018   CO2 25 04/02/2018   GLUCOSE 159 (H) 04/02/2018   BUN 14 04/02/2018   CREATININE 0.78 04/02/2018   CALCIUM 9.0 04/02/2018   PROT 7.1 04/02/2018   ALBUMIN 3.4 (L) 04/02/2018   AST 18 04/02/2018   ALT 11 (L) 04/02/2018   ALKPHOS 79 04/02/2018   BILITOT 0.5 04/02/2018   GFRNONAA >60 04/02/2018   GFRAA >60 04/02/2018    Lab Results  Component Value Date   WBC 3.4 (L) 04/02/2018   NEUTROABS 2.4 04/02/2018   HGB 8.5 (L) 04/02/2018   HCT 25.0 (L) 04/02/2018   MCV 95.6 04/02/2018   PLT 98 (L) 04/02/2018   Lab Results  Component Value Date   IRON 66 02/26/2018   TIBC 289 02/26/2018   IRONPCTSAT 23 02/26/2018    Lab Results  Component Value Date   FERRITIN 178 02/26/2018     STUDIES: Nm Pet Image Restag (ps) Skull Base To Thigh  Result Date: 03/29/2018 CLINICAL DATA:  Subsequent treatment strategy for lung cancer. EXAM: NUCLEAR MEDICINE PET SKULL BASE TO THIGH TECHNIQUE: 13.64 mCi F-18 FDG was injected intravenously. Full-ring PET imaging was performed from the skull base to thigh after the radiotracer. CT data was obtained and used for attenuation correction and anatomic localization. Fasting blood glucose: 122 mg/dl COMPARISON:  Multiple prior PET CTs.  The most recent is 11/30/2017 FINDINGS: Mediastinal blood pool activity: SUV max 3.26 NECK: No hypermetabolic lymph nodes in the neck. Incidental CT findings: none CHEST: Progressive mediastinal tumor. Precarinal adenopathy measures a maximum of 27.5 mm on image number 86 and previously measured 13 mm. The SUV max is 12.75 and was previously 6.52 Subcarinal nodal mass measures 28.5 mm on image number 98 and previously measured 22.5 mm. SUV max is 10.46 and was previously 10.94 left upper lobe lung mass centrally shows hypermetabolism with SUV max of  6.0. This was previously 4.3. Stable drowned/obstructed left upper lobe. Small scattered pulmonary nodules are stable. Chronic significant interstitial lung disease. Incidental CT findings: none ABDOMEN/PELVIS: No abnormal hypermetabolic activity within the liver, pancreas, adrenal glands, or spleen. No hypermetabolic lymph nodes in the abdomen or pelvis. Incidental CT findings: none SKELETON: No focal hypermetabolic activity to suggest skeletal metastasis. Incidental CT findings: none IMPRESSION: 1. Progressive left suprahilar/left upper lobe tumor. 2. Progressive mediastinal lymphadenopathy. 3. Stable small scattered pulmonary nodules and interstitial lung disease. 4. No findings for metastatic disease involving the abdomen/pelvis or osseous structures. Electronically Signed   By: Marijo Sanes M.D.   On: 03/29/2018 15:40    Oncology treatment history:  Patient completed her initial treatment with concurrent chemotherapy and XRT on March 07, 2016. She then underwent 2 cycles of consolidation carboplatinum and Taxol completing on Apr 27, 2016.  CT scan results from Apr 18, 2017 revealed progression of disease. She subsequently received additional XRT and proceeded with nivolumab every 2 weeks.  She completed a total of 13 infusions of nivolumab on November 06, 2017.  PET scan on November 30, 2017 revealed progression of disease.  Patient started carboplatinum and gemcitabine on December 11, 2017.  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  PET scan results from March 29, 2018 reviewed independently report as above with progressive disease in her left suprahilar/left upper lobe as well as her mixed mediastinal lymphadenopathy.  Will discontinue carboplatin and gemcitabine today and proceed with cycle 1 of single agent Taxotere.  OmniSeq testing is pending at  time of dictation.  Return to clinic in 1 week for laboratory work and further evaluation and in 3 weeks for consideration of cycle 2.   2. Headaches/dizziness: Chronic and unchanged.  MRI of the brain reviewed independently with no obvious metastatic disease. 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 Okay to proceed from an oncology standpoint.  Continue follow-up with neurosurgery as scheduled. 3. Anemia: Patient's hemoglobin is decreased, but stable at 8.5.  She does not require additional transfusion today.   4. Thrombocytopenia: Decreased, but will proceed with treatment as above.   5. Hyperglycemia: Patient's blood glucose is improved today at 159.  Monitor closely with dexamethasone as a premedication. Continue diabetic medications as prescribed.  Appreciate endocrinology input.   6. Pain: Patient does not complain of this today.  Continue oxycodone as needed. 7.  Hypothyroidism: Continue Synthroid and follow-up with endocrinology as indicated.  8.  Blurry vision: Patient does not complain of this today.  Patient reports she has appointment with ophthalmology in the near future.  Patient expressed understanding and was in agreement with this plan. She also understands that She can call clinic at any time with any questions, concerns, or complaints.    [name], MD   04/03/2018 10:32 PM

## 2018-04-02 ENCOUNTER — Inpatient Hospital Stay: Payer: PPO

## 2018-04-02 ENCOUNTER — Encounter: Payer: Self-pay | Admitting: Oncology

## 2018-04-02 ENCOUNTER — Inpatient Hospital Stay (HOSPITAL_BASED_OUTPATIENT_CLINIC_OR_DEPARTMENT_OTHER): Payer: PPO | Admitting: Oncology

## 2018-04-02 ENCOUNTER — Other Ambulatory Visit: Payer: Self-pay

## 2018-04-02 VITALS — BP 114/74 | HR 91 | Temp 96.2°F | Resp 18 | Wt 261.4 lb

## 2018-04-02 VITALS — BP 112/72 | HR 74 | Resp 20

## 2018-04-02 DIAGNOSIS — D649 Anemia, unspecified: Secondary | ICD-10-CM

## 2018-04-02 DIAGNOSIS — E1165 Type 2 diabetes mellitus with hyperglycemia: Secondary | ICD-10-CM

## 2018-04-02 DIAGNOSIS — Z5111 Encounter for antineoplastic chemotherapy: Secondary | ICD-10-CM | POA: Diagnosis not present

## 2018-04-02 DIAGNOSIS — C3412 Malignant neoplasm of upper lobe, left bronchus or lung: Secondary | ICD-10-CM

## 2018-04-02 DIAGNOSIS — D696 Thrombocytopenia, unspecified: Secondary | ICD-10-CM

## 2018-04-02 DIAGNOSIS — E039 Hypothyroidism, unspecified: Secondary | ICD-10-CM | POA: Diagnosis not present

## 2018-04-02 LAB — COMPREHENSIVE METABOLIC PANEL
ALBUMIN: 3.4 g/dL — AB (ref 3.5–5.0)
ALK PHOS: 79 U/L (ref 38–126)
ALT: 11 U/L — ABNORMAL LOW (ref 14–54)
AST: 18 U/L (ref 15–41)
Anion gap: 5 (ref 5–15)
BUN: 14 mg/dL (ref 6–20)
CALCIUM: 9 mg/dL (ref 8.9–10.3)
CO2: 25 mmol/L (ref 22–32)
Chloride: 108 mmol/L (ref 101–111)
Creatinine, Ser: 0.78 mg/dL (ref 0.44–1.00)
GFR calc non Af Amer: >60 mL/min (ref >60)
GLUCOSE: 159 mg/dL — AB (ref 65–99)
POTASSIUM: 4 mmol/L (ref 3.5–5.1)
SODIUM: 138 mmol/L (ref 135–145)
TOTAL PROTEIN: 7.1 g/dL (ref 6.5–8.1)
Total Bilirubin: 0.5 mg/dL (ref 0.3–1.2)

## 2018-04-02 LAB — CBC WITH DIFFERENTIAL/PLATELET
Basophils Absolute: 0 10*3/uL (ref 0–0.1)
Basophils Relative: 1 %
EOS ABS: 0.1 10*3/uL (ref 0–0.7)
EOS PCT: 2 %
HCT: 25 % — ABNORMAL LOW (ref 35.0–47.0)
HEMOGLOBIN: 8.5 g/dL — AB (ref 12.0–16.0)
Lymphocytes Relative: 17 %
Lymphs Abs: 0.6 10*3/uL — ABNORMAL LOW (ref 1.0–3.6)
MCH: 32.5 pg (ref 26.0–34.0)
MCHC: 33.9 g/dL (ref 32.0–36.0)
MCV: 95.6 fL (ref 80.0–100.0)
Monocytes Absolute: 0.4 10*3/uL (ref 0.2–0.9)
Monocytes Relative: 11 %
NEUTROS PCT: 69 %
Neutro Abs: 2.4 10*3/uL (ref 1.4–6.5)
PLATELETS: 98 10*3/uL — AB (ref 150–440)
RBC: 2.62 MIL/uL — AB (ref 3.80–5.20)
RDW: 17.6 % — ABNORMAL HIGH (ref 11.5–14.5)
WBC: 3.4 10*3/uL — AB (ref 3.6–11.0)

## 2018-04-02 LAB — SAMPLE TO BLOOD BANK

## 2018-04-02 MED ORDER — DEXAMETHASONE SODIUM PHOSPHATE 10 MG/ML IJ SOLN
10.0000 mg | Freq: Once | INTRAMUSCULAR | Status: AC
  Administered 2018-04-02: 10 mg via INTRAVENOUS
  Filled 2018-04-02: qty 1

## 2018-04-02 MED ORDER — SODIUM CHLORIDE 0.9 % IV SOLN
75.0000 mg/m2 | Freq: Once | INTRAVENOUS | Status: AC
  Administered 2018-04-02: 170 mg via INTRAVENOUS
  Filled 2018-04-02: qty 17

## 2018-04-02 MED ORDER — SODIUM CHLORIDE 0.9 % IV SOLN
Freq: Once | INTRAVENOUS | Status: AC
  Administered 2018-04-02: 11:00:00 via INTRAVENOUS
  Filled 2018-04-02: qty 1000

## 2018-04-02 MED ORDER — HEPARIN SOD (PORK) LOCK FLUSH 100 UNIT/ML IV SOLN
500.0000 [IU] | Freq: Once | INTRAVENOUS | Status: AC | PRN
  Administered 2018-04-02: 500 [IU]
  Filled 2018-04-02: qty 5

## 2018-04-02 MED ORDER — SODIUM CHLORIDE 0.9 % IV SOLN: 10.0000 mg | Freq: Once | INTRAVENOUS | Status: DC

## 2018-04-02 MED ORDER — SODIUM CHLORIDE 0.9% FLUSH
10.0000 mL | INTRAVENOUS | Status: DC | PRN
  Administered 2018-04-02: 10 mL
  Filled 2018-04-02: qty 10

## 2018-04-02 NOTE — Progress Notes (Signed)
Patient here for follow up. Voices no concerns.

## 2018-04-02 NOTE — Progress Notes (Signed)
Platelets: 98,000. MD, Dr. Grayland Ormond, notified and already aware. Per MD order: proceed with scheduled Taxotere treatment today.

## 2018-04-06 NOTE — Progress Notes (Signed)
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  INTERVAL HISTORY: Patient returns to clinic today for repeat laboratory work, further evaluation, and to assess her toleration of Taxotere.  She tolerated it well without significant side effects.  She currently feels at her baseline. Her dizziness is unchanged.  She has no other neurologic complaints. She has chronic weakness and fatigue which is unchanged.  She denies any recent fevers or illnesses. She denies any chest pain, cough, hemoptysis, or shortness of breath. She denies nausea, constipation, diarrhea, or vomiting.  She has no urinary complaints.  Patient offers no specific complaints today.  REVIEW OF SYSTEMS:   Review of Systems  Constitutional: Positive for malaise/fatigue. Negative for fever and weight loss.  HENT: Negative.  Negative for congestion.   Eyes: Negative.  Negative for blurred vision.  Respiratory: Negative.  Negative for cough, hemoptysis and shortness of breath.   Cardiovascular: Negative.  Negative for chest pain and leg swelling.  Gastrointestinal: Negative.  Negative for abdominal pain and constipation.  Genitourinary: Negative.  Negative for flank pain.  Musculoskeletal: Negative.  Negative for joint pain and myalgias.  Skin: Negative.  Negative for rash.  Neurological: Positive for dizziness, sensory change and weakness. Negative for focal weakness and headaches.  Psychiatric/Behavioral: Negative.  Negative for memory loss. The patient  is not nervous/anxious.     As per HPI. Otherwise, a complete review of systems is negative.  PAST MEDICAL HISTORY: Past Medical History:  Diagnosis Date  . Arthritis   . Blood dyscrasia   . Cancer of left lung ([place]) 08/2015   Rad + chemo tx's.  . Diabetes mellitus without complication ([place])   . Diabetic neuropathy ([place])   . High cholesterol   . Hypertension   . Insulin dependent diabetes mellitus ([place])     PAST SURGICAL HISTORY: Past Surgical History:  Procedure Laterality Date  . ABDOMINAL HYSTERECTOMY    . CESAREAN SECTION    . ELECTROMAGNETIC NAVIGATION BROCHOSCOPY N/A 11/26/2015   Procedure: ELECTROMAGNETIC NAVIGATION BRONCHOSCOPY;  Surgeon: Flora Lipps, MD;  Location: [facility];  Service: Cardiopulmonary;  Laterality: N/A;  . ENDOBRONCHIAL ULTRASOUND N/A 11/26/2015   Procedure: ENDOBRONCHIAL ULTRASOUND;  Surgeon: Flora Lipps, MD;  Location: [facility];  Service: Cardiopulmonary;  Laterality: N/A;  . PERIPHERAL VASCULAR CATHETERIZATION N/A 12/09/2015   Procedure: Glori Luis Cath Insertion;  Surgeon: Katha Cabal, MD;  Location: [facility];  Service: Cardiovascular;  Laterality: N/A;    FAMILY HISTORY: Reviewed and unchanged. No reported history of malignancy or chronic disease.     ADVANCED DIRECTIVES:    HEALTH MAINTENANCE: Social History   Tobacco Use  . Smoking status: Current Every Day Smoker    Packs/day: 0.30    Years: 35.00    Pack years: 10.50    Types: Cigarettes  . Smokeless tobacco: Never Used  . Tobacco comment: workiing on quitting, down to 2 packs per week now  Substance Use Topics  . Alcohol use: No    Alcohol/week: 0.0 oz  . Drug use: No     No Known Allergies  Current Outpatient Medications  Medication Sig Dispense Refill  . albuterol (PROVENTIL HFA;VENTOLIN HFA) 108 (90 Base) MCG/ACT inhaler Inhale 2 puffs into the lungs every 6 (six) hours as needed for wheezing or shortness of breath. 1 Inhaler 2  . atorvastatin (LIPITOR) 20  MG tablet Take 20 mg by mouth daily at 6 PM.    . gabapentin (NEURONTIN) 300 MG capsule Take 1 capsule (300 mg total) by mouth at bedtime. 30 capsule 2  . insulin glargine (LANTUS) 100 UNIT/ML injection Inject 0.52 mLs (52 Units total) into the skin daily. 10 mL 11  . levothyroxine (SYNTHROID, LEVOTHROID) 150 MCG tablet Take 200 mcg by mouth daily before breakfast.     . lidocaine-prilocaine (EMLA) cream Apply to affected area once 30 g 6  . lisinopril (PRINIVIL,ZESTRIL) 10 MG tablet Take 10 mg by mouth daily.    Marland Kitchen oxyCODONE (OXY IR/ROXICODONE) 5 MG immediate release tablet Take 1 tablet (5 mg total) by mouth every 6 (six) hours as needed for severe pain. 30 tablet 0  . polyethylene glycol (MIRALAX / GLYCOLAX) packet Take 17 g by mouth daily as needed for mild constipation. 30 each 0  . magic mouthwash w/lidocaine SOLN Take 5 mLs by mouth 4 (four) times daily. (Patient not taking: Reported on 04/02/2018) 240 mL 1  . magnesium oxide (MAG-OX) 400 (241.3 Mg) MG tablet Take 1 tablet (400 mg total) by mouth daily. 14 tablet 0   No current facility-administered medications for this visit.    Facility-Administered Medications Ordered in Other Visits  Medication Dose Route Frequency Provider Last Rate Last Dose  . heparin lock flush 100 unit/mL  500 Units Intravenous Once [name], MD      . sodium chloride flush (NS) 0.9 % injection 10 mL  10 mL Intravenous Once [name], MD        OBJECTIVE: Vitals:   04/09/18 1457  BP: 106/68  Pulse: 85  Resp: 20  Temp: (!) 96.9 F (36.1 C)     Body mass index is 43.75 kg/m.    ECOG FS:1 - Symptomatic but completely ambulatory  General: Well-developed, well-nourished, no acute distress. Eyes: Pink conjunctiva, anicteric sclera. Lungs: Clear to auscultation bilaterally. Heart: Regular rate and rhythm. No rubs, murmurs, or gallops. Abdomen: Soft, nontender, nondistended. No organomegaly noted, normoactive bowel  sounds. Musculoskeletal: No edema, cyanosis, or clubbing. Neuro: Alert, answering all questions appropriately. Cranial nerves grossly intact. Skin: No rashes or petechiae noted. Psych: Normal affect.  LAB RESULTS:  Lab Results  Component Value Date   NA 136 04/10/2018   K 4.2 04/10/2018   CL 105 04/10/2018   CO2 22 04/10/2018   GLUCOSE 101 (H) 04/10/2018   BUN 15 04/10/2018   CREATININE 0.81 04/10/2018   CALCIUM 9.0 04/10/2018   PROT 7.1 04/10/2018   ALBUMIN 3.5 04/10/2018   AST 20 04/10/2018   ALT 12 (L) 04/10/2018   ALKPHOS 73 04/10/2018   BILITOT 0.6 04/10/2018   GFRNONAA >60 04/10/2018   GFRAA >60 04/10/2018    Lab Results  Component Value Date   WBC 0.6 (LL) 04/10/2018   NEUTROABS 0.1 (L) 04/10/2018   HGB 7.9 (L) 04/10/2018   HCT 23.1 (L) 04/10/2018   MCV 94.1 04/10/2018   PLT 137 (L) 04/10/2018   Lab Results  Component Value Date   IRON 66 02/26/2018   TIBC 289 02/26/2018   IRONPCTSAT 23 02/26/2018    Lab Results  Component Value Date   FERRITIN 178 02/26/2018  STUDIES: Nm Pet Image Restag (ps) Skull Base To Thigh  Result Date: 03/29/2018 CLINICAL DATA:  Subsequent treatment strategy for lung cancer. EXAM: NUCLEAR MEDICINE PET SKULL BASE TO THIGH TECHNIQUE: 13.64 mCi F-18 FDG was injected intravenously. Full-ring PET imaging was performed from the skull base to thigh after the radiotracer. CT data was obtained and used for attenuation correction and anatomic localization. Fasting blood glucose: 122 mg/dl COMPARISON:  Multiple prior PET CTs.  The most recent is 11/30/2017 FINDINGS: Mediastinal blood pool activity: SUV max 3.26 NECK: No hypermetabolic lymph nodes in the neck. Incidental CT findings: none CHEST: Progressive mediastinal tumor. Precarinal adenopathy measures a maximum of 27.5 mm on image number 86 and previously measured 13 mm. The SUV max is 12.75 and was previously 6.52 Subcarinal nodal mass measures 28.5 mm on image number 98 and previously  measured 22.5 mm. SUV max is 10.46 and was previously 10.94 left upper lobe lung mass centrally shows hypermetabolism with SUV max of 6.0. This was previously 4.3. Stable drowned/obstructed left upper lobe. Small scattered pulmonary nodules are stable. Chronic significant interstitial lung disease. Incidental CT findings: none ABDOMEN/PELVIS: No abnormal hypermetabolic activity within the liver, pancreas, adrenal glands, or spleen. No hypermetabolic lymph nodes in the abdomen or pelvis. Incidental CT findings: none SKELETON: No focal hypermetabolic activity to suggest skeletal metastasis. Incidental CT findings: none IMPRESSION: 1. Progressive left suprahilar/left upper lobe tumor. 2. Progressive mediastinal lymphadenopathy. 3. Stable small scattered pulmonary nodules and interstitial lung disease. 4. No findings for metastatic disease involving the abdomen/pelvis or osseous structures. Electronically Signed   By: Marijo Sanes M.D.   On: 03/29/2018 15:40    Oncology treatment history:  Patient completed her initial treatment with concurrent chemotherapy and XRT on March 07, 2016. She then underwent 2 cycles of consolidation carboplatinum and Taxol completing on Apr 27, 2016.  CT scan results from Apr 18, 2017 revealed progression of disease. She subsequently received additional XRT and proceeded with nivolumab every 2 weeks.  She completed a total of 13 infusions of nivolumab on November 06, 2017.  PET scan on November 30, 2017 revealed progression of disease.  Patient started carboplatinum and gemcitabine on December 11, 2017.  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  PET scan results from March 29, 2018 reviewed independently report as above with progressive disease in her left suprahilar/left upper lobe as well as her mixed mediastinal lymphadenopathy.   Patient received cycle 1 of single agent Taxotere last week and tolerated it well.  Unable to perform OmniSeq testing secondary to insufficient tissue.  Return to clinic in 2 weeks as scheduled for further evaluation and consideration of cycle 2.  Will likely reimage after cycle 4.   2. Headaches/dizziness: Chronic and unchanged.  Patient does not complain of headache today.  MRI of the brain reviewed independently with no obvious metastatic disease. 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 Okay to proceed from an oncology standpoint.  Continue follow-up with neurosurgery as scheduled. 3. Anemia: Patient's hemoglobin has trended down slightly to 7.9.  Consider blood transfusion in the future if it continues to decline.   4. Thrombocytopenia: Mild.  Monitor throughout treatment. 5.  Neutropenia: Secondary to Taxotere.  Will consider udenyca with future treatments. 5. Hyperglycemia: Improved control.  Continue diabetic medications as prescribed.  Appreciate endocrinology input.  6. Pain: Patient does not complain of this today.  Continue oxycodone as needed. 7.  Hypothyroidism: Continue Synthroid and follow-up with endocrinology as indicated. 8.  Blurry vision: Patient does not complain of this today.  Patient reports she has appointment with ophthalmology in the near future.    Approximately 30 minutes was spent in discussion of which greater than 50% was consultation.  Patient expressed understanding and was in agreement with this plan. She also understands that She can call clinic at any time with any questions, concerns, or complaints.    [name], MD   04/14/2018 7:27 AM

## 2018-04-09 ENCOUNTER — Inpatient Hospital Stay (HOSPITAL_BASED_OUTPATIENT_CLINIC_OR_DEPARTMENT_OTHER): Payer: PPO | Admitting: Oncology

## 2018-04-09 ENCOUNTER — Encounter: Payer: Self-pay | Admitting: Oncology

## 2018-04-09 ENCOUNTER — Other Ambulatory Visit: Payer: Self-pay

## 2018-04-09 ENCOUNTER — Telehealth: Payer: Self-pay | Admitting: *Deleted

## 2018-04-09 ENCOUNTER — Inpatient Hospital Stay: Payer: PPO | Attending: Oncology

## 2018-04-09 ENCOUNTER — Other Ambulatory Visit: Payer: Self-pay | Admitting: *Deleted

## 2018-04-09 VITALS — BP 106/68 | HR 85 | Temp 96.9°F | Resp 20 | Wt 254.9 lb

## 2018-04-09 DIAGNOSIS — C3412 Malignant neoplasm of upper lobe, left bronchus or lung: Secondary | ICD-10-CM

## 2018-04-09 DIAGNOSIS — Z452 Encounter for adjustment and management of vascular access device: Secondary | ICD-10-CM | POA: Diagnosis not present

## 2018-04-09 DIAGNOSIS — E039 Hypothyroidism, unspecified: Secondary | ICD-10-CM

## 2018-04-09 DIAGNOSIS — D649 Anemia, unspecified: Secondary | ICD-10-CM | POA: Diagnosis not present

## 2018-04-09 DIAGNOSIS — R42 Dizziness and giddiness: Secondary | ICD-10-CM | POA: Insufficient documentation

## 2018-04-09 DIAGNOSIS — R51 Headache: Secondary | ICD-10-CM

## 2018-04-09 DIAGNOSIS — D696 Thrombocytopenia, unspecified: Secondary | ICD-10-CM | POA: Insufficient documentation

## 2018-04-09 DIAGNOSIS — D701 Agranulocytosis secondary to cancer chemotherapy: Secondary | ICD-10-CM

## 2018-04-09 DIAGNOSIS — W19XXXA Unspecified fall, initial encounter: Secondary | ICD-10-CM | POA: Diagnosis not present

## 2018-04-09 DIAGNOSIS — R531 Weakness: Secondary | ICD-10-CM

## 2018-04-09 DIAGNOSIS — Z5111 Encounter for antineoplastic chemotherapy: Secondary | ICD-10-CM | POA: Insufficient documentation

## 2018-04-09 DIAGNOSIS — E1165 Type 2 diabetes mellitus with hyperglycemia: Secondary | ICD-10-CM | POA: Diagnosis not present

## 2018-04-09 LAB — CBC WITH DIFFERENTIAL/PLATELET
BASOS ABS: 0 10*3/uL (ref 0–0.1)
Basophils Relative: 1 %
EOS PCT: 3 %
Eosinophils Absolute: 0 10*3/uL (ref 0–0.7)
HEMATOCRIT: 24.9 % — AB (ref 35.0–47.0)
Hemoglobin: 8.5 g/dL — ABNORMAL LOW (ref 12.0–16.0)
LYMPHS ABS: 0.4 10*3/uL — AB (ref 1.0–3.6)
LYMPHS PCT: 65 %
MCH: 32.2 pg (ref 26.0–34.0)
MCHC: 34.1 g/dL (ref 32.0–36.0)
MCV: 94.4 fL (ref 80.0–100.0)
Monocytes Absolute: 0.1 10*3/uL — ABNORMAL LOW (ref 0.2–0.9)
Monocytes Relative: 17 %
NEUTROS PCT: 14 %
Neutro Abs: 0.1 10*3/uL — ABNORMAL LOW (ref 1.4–6.5)
Platelets: 123 10*3/uL — ABNORMAL LOW (ref 150–440)
RBC: 2.63 MIL/uL — AB (ref 3.80–5.20)
RDW: 17 % — ABNORMAL HIGH (ref 11.5–14.5)
WBC: 0.6 10*3/uL — AB (ref 3.6–11.0)

## 2018-04-09 LAB — COMPREHENSIVE METABOLIC PANEL
ALT: 12 U/L — ABNORMAL LOW (ref 14–54)
AST: 17 U/L (ref 15–41)
Albumin: 3.4 g/dL — ABNORMAL LOW (ref 3.5–5.0)
Alkaline Phosphatase: 69 U/L (ref 38–126)
Anion gap: 8 (ref 5–15)
BUN: 17 mg/dL (ref 6–20)
CHLORIDE: 105 mmol/L (ref 101–111)
CO2: 23 mmol/L (ref 22–32)
Calcium: 9.2 mg/dL (ref 8.9–10.3)
Creatinine, Ser: 0.77 mg/dL (ref 0.44–1.00)
GFR calc Af Amer: >60 mL/min (ref >60)
Glucose, Bld: 96 mg/dL (ref 65–99)
POTASSIUM: 4.1 mmol/L (ref 3.5–5.1)
Sodium: 136 mmol/L (ref 135–145)
Total Bilirubin: 0.7 mg/dL (ref 0.3–1.2)
Total Protein: 7.2 g/dL (ref 6.5–8.1)

## 2018-04-09 LAB — SAMPLE TO BLOOD BANK

## 2018-04-09 MED ORDER — OXYCODONE HCL 5 MG PO TABS: 5.0000 mg | ORAL_TABLET | Freq: Four times a day (QID) | ORAL | 0 refills | Status: DC | PRN

## 2018-04-09 NOTE — Progress Notes (Signed)
Patient denies any concerns today.  

## 2018-04-09 NOTE — Telephone Encounter (Signed)
Pt notified that colonoscopy scheduled for 5/10 has been cancelled.

## 2018-04-10 ENCOUNTER — Inpatient Hospital Stay (HOSPITAL_BASED_OUTPATIENT_CLINIC_OR_DEPARTMENT_OTHER): Payer: PPO | Admitting: Oncology

## 2018-04-10 ENCOUNTER — Telehealth: Payer: Self-pay | Admitting: *Deleted

## 2018-04-10 ENCOUNTER — Inpatient Hospital Stay: Payer: PPO

## 2018-04-10 DIAGNOSIS — D6481 Anemia due to antineoplastic chemotherapy: Secondary | ICD-10-CM

## 2018-04-10 DIAGNOSIS — C3412 Malignant neoplasm of upper lobe, left bronchus or lung: Secondary | ICD-10-CM | POA: Diagnosis not present

## 2018-04-10 DIAGNOSIS — R112 Nausea with vomiting, unspecified: Secondary | ICD-10-CM

## 2018-04-10 DIAGNOSIS — T451X5A Adverse effect of antineoplastic and immunosuppressive drugs, initial encounter: Secondary | ICD-10-CM

## 2018-04-10 DIAGNOSIS — W19XXXA Unspecified fall, initial encounter: Secondary | ICD-10-CM

## 2018-04-10 DIAGNOSIS — Z5111 Encounter for antineoplastic chemotherapy: Secondary | ICD-10-CM | POA: Diagnosis not present

## 2018-04-10 DIAGNOSIS — D701 Agranulocytosis secondary to cancer chemotherapy: Secondary | ICD-10-CM

## 2018-04-10 DIAGNOSIS — R269 Unspecified abnormalities of gait and mobility: Secondary | ICD-10-CM

## 2018-04-10 DIAGNOSIS — Z5189 Encounter for other specified aftercare: Secondary | ICD-10-CM

## 2018-04-10 DIAGNOSIS — D649 Anemia, unspecified: Secondary | ICD-10-CM

## 2018-04-10 DIAGNOSIS — R531 Weakness: Secondary | ICD-10-CM | POA: Diagnosis not present

## 2018-04-10 LAB — COMPREHENSIVE METABOLIC PANEL
ALBUMIN: 3.5 g/dL (ref 3.5–5.0)
ALK PHOS: 73 U/L (ref 38–126)
ALT: 12 U/L — AB (ref 14–54)
ANION GAP: 9 (ref 5–15)
AST: 20 U/L (ref 15–41)
BILIRUBIN TOTAL: 0.6 mg/dL (ref 0.3–1.2)
BUN: 15 mg/dL (ref 6–20)
CALCIUM: 9 mg/dL (ref 8.9–10.3)
CO2: 22 mmol/L (ref 22–32)
CREATININE: 0.81 mg/dL (ref 0.44–1.00)
Chloride: 105 mmol/L (ref 101–111)
GFR calc Af Amer: >60 mL/min (ref >60)
GFR calc non Af Amer: >60 mL/min (ref >60)
Glucose, Bld: 101 mg/dL — ABNORMAL HIGH (ref 65–99)
Potassium: 4.2 mmol/L (ref 3.5–5.1)
Sodium: 136 mmol/L (ref 135–145)
TOTAL PROTEIN: 7.1 g/dL (ref 6.5–8.1)

## 2018-04-10 LAB — CBC WITH DIFFERENTIAL/PLATELET
BASOS ABS: 0 10*3/uL (ref 0–0.1)
BASOS PCT: 1 %
EOS ABS: 0 10*3/uL (ref 0–0.7)
EOS PCT: 1 %
HCT: 23.1 % — ABNORMAL LOW (ref 35.0–47.0)
Hemoglobin: 7.9 g/dL — ABNORMAL LOW (ref 12.0–16.0)
Lymphocytes Relative: 56 %
Lymphs Abs: 0.3 10*3/uL — ABNORMAL LOW (ref 1.0–3.6)
MCH: 32.4 pg (ref 26.0–34.0)
MCHC: 34.4 g/dL (ref 32.0–36.0)
MCV: 94.1 fL (ref 80.0–100.0)
MONO ABS: 0.2 10*3/uL (ref 0.2–0.9)
MONOS PCT: 27 %
NEUTROS ABS: 0.1 10*3/uL — AB (ref 1.4–6.5)
Neutrophils Relative %: 15 %
PLATELETS: 137 10*3/uL — AB (ref 150–440)
RBC: 2.45 MIL/uL — ABNORMAL LOW (ref 3.80–5.20)
RDW: 17 % — AB (ref 11.5–14.5)
WBC: 0.6 10*3/uL — CL (ref 3.6–11.0)

## 2018-04-10 LAB — MAGNESIUM: Magnesium: 1.1 mg/dL — ABNORMAL LOW (ref 1.7–2.4)

## 2018-04-10 LAB — PREPARE RBC (CROSSMATCH)

## 2018-04-10 MED ORDER — MAGNESIUM OXIDE 400 (241.3 MG) MG PO TABS: 400.0000 mg | ORAL_TABLET | Freq: Every day | ORAL | 0 refills | Status: AC

## 2018-04-10 MED ORDER — SODIUM CHLORIDE 0.9% FLUSH
10.0000 mL | Freq: Once | INTRAVENOUS | Status: AC
  Administered 2018-04-10: 10 mL via INTRAVENOUS
  Filled 2018-04-10: qty 10

## 2018-04-10 MED ORDER — ONDANSETRON HCL 4 MG/2ML IJ SOLN
INTRAMUSCULAR | Status: AC
  Filled 2018-04-10: qty 4

## 2018-04-10 MED ORDER — SODIUM CHLORIDE 0.9 % IV SOLN: Freq: Once | INTRAVENOUS | Status: DC

## 2018-04-10 MED ORDER — SODIUM CHLORIDE 0.9 % IV SOLN: 4.0000 g | Freq: Once | INTRAVENOUS | Status: DC

## 2018-04-10 MED ORDER — ONDANSETRON HCL 4 MG/2ML IJ SOLN
8.0000 mg | Freq: Once | INTRAMUSCULAR | Status: AC
  Administered 2018-04-10: 8 mg via INTRAVENOUS

## 2018-04-10 MED ORDER — MAGNESIUM SULFATE 4 GM/100ML IV SOLN
4.0000 g | Freq: Once | INTRAVENOUS | Status: AC
  Administered 2018-04-10: 4 g via INTRAVENOUS
  Filled 2018-04-10: qty 100

## 2018-04-10 MED ORDER — HEPARIN SOD (PORK) LOCK FLUSH 100 UNIT/ML IV SOLN
500.0000 [IU] | Freq: Once | INTRAVENOUS | Status: AC
  Administered 2018-04-10: 500 [IU] via INTRAVENOUS
  Filled 2018-04-10: qty 5

## 2018-04-10 NOTE — Telephone Encounter (Signed)
Odd. If she can come in and see [facility] that would be best.

## 2018-04-10 NOTE — Progress Notes (Signed)
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 Grayland Ormond yesterday to assess toleration of cycle 1 Taxotere. She  appeared to feel well and was back to baseline.  Labs did reveal severe neutropenia with an ANC of 0.1 and anemia with hemoglobin of 8.5.  Recent PET scan from March 29, 2018 revealed progressive disease.  Discontinued carbo/gemcitabine and proceed with single agent Taxotere.  Received first cycle of Taxotere on 04/02/2018.   Patient initially treated with concurrent chemo radiation on March 07, 2016.  Underwent 2 cycles of consolidation carbo/Taxol completing in May 2017.  CT scans from May 2018 revealed progression of disease.  Subsequently received radiation and proceeded with nivolumab every 2 weeks.  Completed a total of 13 infusions of nivolumab on November 06, 2017.  Repeat PET scan in December 2018 revealed progression of disease.  Patient started on carbo/gemcitabine on January 2019.  Interval history-  LENETTE RAU presents with a report of weakness and fall.  Symptoms began yesterday.  Sentinal symptom the patient feels fatigue began with: Unsure- Lots of activity yesterday.  Symptoms of her fatigue have been diffuse soft tissue aches and pains and general malaise. Unable to get off the floor after she fell this morning. Patient  describes the following psychologic symptoms: none.   Patient denies fever.  Symptoms have gradually worsened. In wheelchair today.  Severity has been struggles to carry out day to day responsibilities..  Previous visits for this problem: none.   ECOG FS:2 - Symptomatic, <50% confined to bed  Review of systems- Review of Systems  Constitutional: Positive for malaise/fatigue. Negative for chills, fever and weight loss.  HENT: Negative for congestion and ear pain.   Eyes: Negative.  Negative for blurred vision and double vision.  Respiratory: Negative.  Negative for cough, sputum production and shortness of breath.   Cardiovascular: Positive for leg swelling (Non-pitting edema). Negative for chest pain and palpitations.  Gastrointestinal: Negative.  Negative for abdominal pain, constipation, diarrhea, nausea and vomiting.  Genitourinary: Negative for dysuria, frequency and urgency.  Musculoskeletal: Positive for falls (This morning). Negative for back pain.  Skin: Negative.  Negative for rash.  Neurological: Positive for dizziness (chronic), tingling, sensory change and weakness. Negative for headaches.  Endo/Heme/Allergies: Negative.  Does not bruise/bleed easily.  Psychiatric/Behavioral: Negative.  Negative for depression. The patient is not nervous/anxious and does not have insomnia.      Current treatment- Single agent Taxotoere  No Known Allergies   Past Medical History:  Diagnosis Date  . Arthritis   . Blood dyscrasia   . Cancer of left lung ([place]) 08/2015   Rad + chemo tx's.  . Diabetes mellitus without complication ([place])   . Diabetic neuropathy ([place])   . High cholesterol   . Hypertension   . Insulin dependent diabetes mellitus ([place])      Past Surgical History:  Procedure Laterality Date  . ABDOMINAL HYSTERECTOMY    .  CESAREAN SECTION    . ELECTROMAGNETIC NAVIGATION BROCHOSCOPY N/A 11/26/2015   Procedure: ELECTROMAGNETIC NAVIGATION BRONCHOSCOPY;  Surgeon: Flora Lipps, MD;  Location: [facility] OR[facility];  Service: Cardiopulmonary;  Laterality: N/A;  . ENDOBRONCHIAL ULTRASOUND N/A 11/26/2015   Procedure: ENDOBRONCHIAL ULTRASOUND;  Surgeon: Flora Lipps, MD;  Location: [facility] OR[facility];  Service: Cardiopulmonary;  Laterality: N/A;  . PERIPHERAL VASCULAR CATHETERIZATION N/A 12/09/2015   Procedure: Glori Luis Cath Insertion;  Surgeon: Katha Cabal, MD;  Location: [place] [facility];  Service: Cardiovascular;  Laterality: N/A;    Social History   Socioeconomic History  . Marital status: Widowed    Spouse name: Not on file  . Number of children: Not on file  . Years of education: Not on file  . Highest education level: Not on file  Occupational History  . Not on file  Social Needs  . Financial resource strain: Not on file  . Food insecurity:    Worry: Not on file    Inability: Not on file  . Transportation needs:    Medical: Not on file    Non-medical: Not on file  Tobacco Use  . Smoking status: Current Every Day Smoker    Packs/day: 0.30    Years: 35.00    Pack years: 10.50    Types: Cigarettes  . Smokeless tobacco: Never Used  . Tobacco comment: workiing on quitting, down to 2 packs per week now  Substance and Sexual Activity  . Alcohol use: No    Alcohol/week: 0.0 oz  . Drug use: No  . Sexual activity: Not Currently  Lifestyle  . Physical activity:    Days per week: Not on file    Minutes per session: Not on file  . Stress: Not on file  Relationships  . Social connections:    Talks on phone: Not on file    Gets together: Not on file    Attends religious service: Not on file    Active member of club or organization: Not on file    Attends meetings of clubs or organizations: Not on file    Relationship status: Not on file  . Intimate partner violence:    Fear of current or ex partner: Not on file    Emotionally abused: Not on file    Physically abused: Not on file    Forced sexual activity: Not on file  Other Topics Concern  . Not on file    Social History Narrative  . Not on file    No family history on file.   Current Outpatient Medications:  .  albuterol (PROVENTIL HFA;VENTOLIN HFA) 108 (90 Base) MCG/ACT inhaler, Inhale 2 puffs into the lungs every 6 (six) hours as needed for wheezing or shortness of breath., Disp: 1 Inhaler, Rfl: 2 .  atorvastatin (LIPITOR) 20 MG tablet, Take 20 mg by mouth daily at 6 PM., Disp: , Rfl:  .  gabapentin (NEURONTIN) 300 MG capsule, Take 1 capsule (300 mg total) by mouth at bedtime., Disp: 30 capsule, Rfl: 2 .  insulin glargine (LANTUS) 100 UNIT/ML injection, Inject 0.52 mLs (52 Units total) into the skin daily., Disp: 10 mL, Rfl: 11 .  levothyroxine (SYNTHROID, LEVOTHROID) 150 MCG tablet, Take 200 mcg by mouth daily before breakfast. , Disp: , Rfl:  .  lidocaine-prilocaine (EMLA) cream, Apply to affected area once, Disp: 30 g, Rfl: 6 .  lisinopril (PRINIVIL,ZESTRIL) 10 MG tablet, Take 10 mg by mouth daily., Disp: , Rfl:  .  magic mouthwash w/lidocaine SOLN, Take 5  mLs by mouth 4 (four) times daily. (Patient not taking: Reported on 04/02/2018), Disp: 240 mL, Rfl: 1 .  oxyCODONE (OXY IR/ROXICODONE) 5 MG immediate release tablet, Take 1 tablet (5 mg total) by mouth every 6 (six) hours as needed for severe pain., Disp: 30 tablet, Rfl: 0 .  polyethylene glycol (MIRALAX / GLYCOLAX) packet, Take 17 g by mouth daily as needed for mild constipation., Disp: 30 each, Rfl: 0 No current facility-administered medications for this visit.   Facility-Administered Medications Ordered in Other Visits:  .  heparin lock flush 100 unit/mL, 500 Units, Intravenous, Once, Finnegan, Kathlene November, MD .  heparin lock flush 100 unit/mL, 500 Units, Intravenous, Once, Finnegan, Kathlene November, MD .  magnesium sulfate IVPB 4 g 100 mL, 4 g, Intravenous, Once, [name], MD, Last Rate: 50 mL/hr at 04/10/18 1515, 4 g at 04/10/18 1515 .  sodium chloride flush (NS) 0.9 % injection 10 mL, 10 mL, Intravenous, Once, [name], MD  Physical exam:  Vitals:   04/10/18 1431  BP: 124/77  Pulse: 85  Resp: (!) 22  Temp: (!) 96.9 F (36.1 C)  TempSrc: Tympanic  SpO2: 98%   Physical Exam  Constitutional: She is oriented to person, place, and time. Vital signs are normal. She appears well-developed and well-nourished.  HENT:  Head: Normocephalic and atraumatic.  Eyes: Pupils are equal, round, and reactive to light.  Neck: Normal range of motion.  Cardiovascular: Normal rate, regular rhythm and normal heart sounds.  No murmur heard. + 1 edema BLE. Paraesthesia.   Pulmonary/Chest: Effort normal and breath sounds normal. She has no wheezes.  Abdominal: Soft. Normal appearance and bowel sounds are normal. She exhibits no distension. There is no tenderness.  Musculoskeletal: Normal range of motion. She exhibits no edema.  Neurological: She is alert and oriented to person, place, and time.  Skin: Skin is warm and dry. No rash noted.  Psychiatric: Judgment normal.     CMP Latest Ref Rng & Units 04/10/2018  Glucose 65 - 99 mg/dL 101(H)  BUN 6 - 20 mg/dL 15  Creatinine 0.44 - 1.00 mg/dL 0.81  Sodium 135 - 145 mmol/L 136  Potassium 3.5 - 5.1 mmol/L 4.2  Chloride 101 - 111 mmol/L 105  CO2 22 - 32 mmol/L 22  Calcium 8.9 - 10.3 mg/dL 9.0  Total Protein 6.5 - 8.1 g/dL 7.1  Total Bilirubin 0.3 - 1.2 mg/dL 0.6  Alkaline Phos 38 - 126 U/L 73  AST 15 - 41 U/L 20  ALT 14 - 54 U/L 12(L)   CBC Latest Ref Rng & Units 04/10/2018  WBC 3.6 - 11.0 K/uL 0.6(LL)  Hemoglobin 12.0 - 16.0 g/dL 7.9(L)  Hematocrit 35.0 - 47.0 % 23.1(L)  Platelets 150 - 440 K/uL 137(L)    No images are attached to the encounter.  Nm Pet Image Restag (ps) Skull Base To Thigh  Result Date: 03/29/2018 CLINICAL DATA:  Subsequent treatment strategy for lung cancer. EXAM: NUCLEAR MEDICINE PET SKULL BASE TO THIGH TECHNIQUE: 13.64 mCi F-18 FDG was injected intravenously. Full-ring PET imaging was performed from the skull base to thigh after the  radiotracer. CT data was obtained and used for attenuation correction and anatomic localization. Fasting blood glucose: 122 mg/dl COMPARISON:  Multiple prior PET CTs.  The most recent is 11/30/2017 FINDINGS: Mediastinal blood pool activity: SUV max 3.26 NECK: No hypermetabolic lymph nodes in the neck. Incidental CT findings: none CHEST: Progressive mediastinal tumor. Precarinal adenopathy measures a maximum of 27.5 mm  on image number 86 and previously measured 13 mm. The SUV max is 12.75 and was previously 6.52 Subcarinal nodal mass measures 28.5 mm on image number 98 and previously measured 22.5 mm. SUV max is 10.46 and was previously 10.94 left upper lobe lung mass centrally shows hypermetabolism with SUV max of 6.0. This was previously 4.3. Stable drowned/obstructed left upper lobe. Small scattered pulmonary nodules are stable. Chronic significant interstitial lung disease. Incidental CT findings: none ABDOMEN/PELVIS: No abnormal hypermetabolic activity within the liver, pancreas, adrenal glands, or spleen. No hypermetabolic lymph nodes in the abdomen or pelvis. Incidental CT findings: none SKELETON: No focal hypermetabolic activity to suggest skeletal metastasis. Incidental CT findings: none IMPRESSION: 1. Progressive left suprahilar/left upper lobe tumor. 2. Progressive mediastinal lymphadenopathy. 3. Stable small scattered pulmonary nodules and interstitial lung disease. 4. No findings for metastatic disease involving the abdomen/pelvis or osseous structures. Electronically Signed   By: Marijo Sanes M.D.   On: 03/29/2018 15:40     Assessment and plan- Patient is a 67 y.o. female who presents with weakness and a recent fall without injury.   1. Squamous cell carcinoma of the upper lobe of left lung: S/p cycle 1 Taxotere only on 04/02/18. Recent PET scan showed progression of disease on Carbo/Taxol.   2.  Neutropenia: Continue to maintain neutropenic precautions at home. ANC 0.01.   3. BLE  weakness/fatigue and tingling sensation:  STAT Labs (CBC, CMET and magnesium). Labs reveal  Hypomagnesemia (1.1) and anemia with a hemoglobin of 7.9. Unlikely this is bilateral DVT's but encouraged patient to wear TED hose and let us know if symptoms worsen. Probably from low mag level. Will replete and see if she improves.   Plan: Give 4 g magnesium IV today in clinic. Patient will receive RX 400 mg mag oxide for 14 days. Give 8 mg Zofran in clinic for nausea. Give 500 ml of NaCL.   We will have patient return on Thursday for repeat labs and possible blood transfusion. Orders placed today.   Visit Diagnosis 1. Nausea and vomiting, intractability of vomiting not specified, unspecified vomiting type   2. Anemia due to antineoplastic chemotherapy     Patient expressed understanding and was in agreement with this plan. She also understands that She can call clinic at any time with any questions, concerns, or complaints.   Greater than 50% was spent in counseling and coordination of care with this patient including but not limited to discussion of the relevant topics above (See A&P) including, but not limited to diagnosis and management of acute and chronic medical conditions.    Faythe Casa, AGNP-C [facility]- [phone] Pager- [phone] 04/10/2018 4:45 PM

## 2018-04-10 NOTE — Telephone Encounter (Signed)
I don't know, she woke up like this.  What do you want to do?

## 2018-04-10 NOTE — Telephone Encounter (Addendum)
Patient has accepted appointment for 215 today Lab NP poss IVF

## 2018-04-10 NOTE — Telephone Encounter (Addendum)
Daughter called and states patient is unable to get up reporting that she is having problems with her bilateral lower extremities from knees down, "they feel funny" She had a new chemotherapy treatment 04/02/18. Please advise

## 2018-04-10 NOTE — Telephone Encounter (Signed)
I just saw her yesterday and other than lab work she was doing fine.  What changed overnight?

## 2018-04-12 ENCOUNTER — Inpatient Hospital Stay: Payer: PPO

## 2018-04-12 DIAGNOSIS — D6481 Anemia due to antineoplastic chemotherapy: Secondary | ICD-10-CM

## 2018-04-12 DIAGNOSIS — Z5111 Encounter for antineoplastic chemotherapy: Secondary | ICD-10-CM | POA: Diagnosis not present

## 2018-04-12 DIAGNOSIS — T451X5A Adverse effect of antineoplastic and immunosuppressive drugs, initial encounter: Principal | ICD-10-CM

## 2018-04-12 DIAGNOSIS — C3412 Malignant neoplasm of upper lobe, left bronchus or lung: Secondary | ICD-10-CM

## 2018-04-12 LAB — PREPARE RBC (CROSSMATCH)

## 2018-04-12 LAB — MAGNESIUM: Magnesium: 1.6 mg/dL — ABNORMAL LOW (ref 1.7–2.4)

## 2018-04-12 MED ORDER — HEPARIN SOD (PORK) LOCK FLUSH 100 UNIT/ML IV SOLN
500.0000 [IU] | Freq: Every day | INTRAVENOUS | Status: AC | PRN
  Administered 2018-04-12: 500 [IU]
  Filled 2018-04-12: qty 5

## 2018-04-12 MED ORDER — SODIUM CHLORIDE 0.9% FLUSH
10.0000 mL | INTRAVENOUS | Status: AC | PRN
  Administered 2018-04-12: 10 mL
  Filled 2018-04-12: qty 10

## 2018-04-12 MED ORDER — ACETAMINOPHEN 325 MG PO TABS
650.0000 mg | ORAL_TABLET | Freq: Once | ORAL | Status: AC
  Administered 2018-04-12: 650 mg via ORAL
  Filled 2018-04-12: qty 2

## 2018-04-12 MED ORDER — DIPHENHYDRAMINE HCL 25 MG PO CAPS
25.0000 mg | ORAL_CAPSULE | Freq: Once | ORAL | Status: AC
  Administered 2018-04-12: 25 mg via ORAL
  Filled 2018-04-12: qty 1

## 2018-04-12 MED ORDER — SODIUM CHLORIDE 0.9 % IV SOLN
Freq: Once | INTRAVENOUS | Status: AC
  Administered 2018-04-12: 10:00:00 via INTRAVENOUS
  Filled 2018-04-12: qty 1000

## 2018-04-12 NOTE — Progress Notes (Unsigned)
Magnesium lab drawn while in infusion.  Results back and informed nurse and MD.  No further magnesium to be given at this time. Informed patient of results and per MD to continue PO magnesium at home.  Patient agreed.

## 2018-04-13 ENCOUNTER — Ambulatory Visit: Admit: 2018-04-13 | Payer: PPO | Admitting: Gastroenterology

## 2018-04-13 LAB — BPAM RBC
Blood Product Expiration Date: 201905272359
ISSUE DATE / TIME: 201905091016
Unit Type and Rh: 5100

## 2018-04-13 LAB — TYPE AND SCREEN
ABO/RH(D): O POS
ANTIBODY SCREEN: NEGATIVE
Unit division: 0

## 2018-04-13 SURGERY — ESOPHAGOGASTRODUODENOSCOPY (EGD) WITH PROPOFOL
Anesthesia: General

## 2018-04-17 DIAGNOSIS — I10 Essential (primary) hypertension: Secondary | ICD-10-CM | POA: Diagnosis not present

## 2018-04-17 DIAGNOSIS — E1140 Type 2 diabetes mellitus with diabetic neuropathy, unspecified: Secondary | ICD-10-CM | POA: Diagnosis not present

## 2018-04-17 DIAGNOSIS — H538 Other visual disturbances: Secondary | ICD-10-CM | POA: Diagnosis not present

## 2018-04-17 DIAGNOSIS — D649 Anemia, unspecified: Secondary | ICD-10-CM | POA: Diagnosis not present

## 2018-04-17 DIAGNOSIS — E039 Hypothyroidism, unspecified: Secondary | ICD-10-CM | POA: Diagnosis not present

## 2018-04-17 DIAGNOSIS — M1990 Unspecified osteoarthritis, unspecified site: Secondary | ICD-10-CM | POA: Diagnosis not present

## 2018-04-17 DIAGNOSIS — Z9181 History of falling: Secondary | ICD-10-CM | POA: Diagnosis not present

## 2018-04-17 DIAGNOSIS — F17210 Nicotine dependence, cigarettes, uncomplicated: Secondary | ICD-10-CM | POA: Diagnosis not present

## 2018-04-17 DIAGNOSIS — Z794 Long term (current) use of insulin: Secondary | ICD-10-CM | POA: Diagnosis not present

## 2018-04-17 DIAGNOSIS — C3412 Malignant neoplasm of upper lobe, left bronchus or lung: Secondary | ICD-10-CM | POA: Diagnosis not present

## 2018-04-22 NOTE — Progress Notes (Signed)
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  INTERVAL HISTORY: Patient returns to clinic today for further evaluation and consideration of cycle 2 of Taxotere.  She has significantly improved over the past 2 weeks, but not back to her baseline.  She has significant weakness and fatigue.  She continues to have occasional dizziness.  She has no other neurologic complaints. She denies any recent fevers or illnesses. She denies any chest pain, cough, hemoptysis, or shortness of breath. She denies nausea, constipation, diarrhea, or vomiting.  She has no urinary complaints.  Patient offers no further specific complaints today.    REVIEW OF SYSTEMS:   Review of Systems  Constitutional: Positive for malaise/fatigue. Negative for fever and weight loss.  HENT: Negative.  Negative for congestion.   Eyes: Negative.  Negative for blurred vision.  Respiratory: Negative.  Negative for cough, hemoptysis and shortness of breath.   Cardiovascular: Negative.  Negative for chest pain and leg swelling.  Gastrointestinal: Negative.  Negative for abdominal pain and constipation.  Genitourinary: Negative.  Negative for flank pain.  Musculoskeletal: Negative.  Negative for joint pain and myalgias.  Skin: Negative.  Negative for rash.  Neurological: Positive for dizziness, sensory change and weakness. Negative for focal weakness and headaches.  Psychiatric/Behavioral: Negative.  Negative for memory loss. The patient is not nervous/anxious.      As per HPI. Otherwise, a complete review of systems is negative.  PAST MEDICAL HISTORY: Past Medical History:  Diagnosis Date  . Arthritis   . Blood dyscrasia   . Cancer of left lung ([place]) 08/2015   Rad + chemo tx's.  . Diabetes mellitus without complication ([place])   . Diabetic neuropathy ([place])   . High cholesterol   . Hypertension   . Insulin dependent diabetes mellitus ([place])     PAST SURGICAL HISTORY: Past Surgical History:  Procedure Laterality Date  . ABDOMINAL HYSTERECTOMY    . CESAREAN SECTION    . ELECTROMAGNETIC NAVIGATION BROCHOSCOPY N/A 11/26/2015   Procedure: ELECTROMAGNETIC NAVIGATION BRONCHOSCOPY;  Surgeon: Flora Lipps, MD;  Location: [facility];  Service: Cardiopulmonary;  Laterality: N/A;  . ENDOBRONCHIAL ULTRASOUND N/A 11/26/2015   Procedure: ENDOBRONCHIAL ULTRASOUND;  Surgeon: Flora Lipps, MD;  Location: [facility];  Service: Cardiopulmonary;  Laterality: N/A;  . PERIPHERAL VASCULAR CATHETERIZATION N/A 12/09/2015   Procedure: Glori Luis Cath Insertion;  Surgeon: Katha Cabal, MD;  Location: [facility];  Service: Cardiovascular;  Laterality: N/A;    FAMILY HISTORY: Reviewed and unchanged. No reported history of malignancy or chronic disease.     ADVANCED DIRECTIVES:    HEALTH MAINTENANCE: Social History   Tobacco Use  . Smoking status: Current Every Day Smoker    Packs/day: 0.30    Years: 35.00    Pack years: 10.50    Types: Cigarettes  . Smokeless tobacco: Never Used  . Tobacco comment: workiing on quitting, down to 2 packs per week now  Substance Use Topics  . Alcohol use: No    Alcohol/week: 0.0 oz  . Drug use: No     No Known Allergies  Current Outpatient Medications  Medication Sig Dispense Refill  . atorvastatin (LIPITOR) 20 MG tablet Take 20 mg by mouth daily at 6 PM.    . gabapentin (NEURONTIN) 300 MG capsule Take 1 capsule (300 mg total) by mouth at bedtime. 30 capsule 2  . insulin glargine (LANTUS) 100 UNIT/ML injection  Inject 0.52 mLs (52 Units total) into the skin daily. 10 mL 11  . levothyroxine (SYNTHROID, LEVOTHROID) 150 MCG tablet Take 200 mcg by mouth daily before breakfast.     . lidocaine-prilocaine (EMLA) cream Apply to affected area once 30 g 6  . lisinopril (PRINIVIL,ZESTRIL) 10 MG tablet Take 10 mg by mouth daily.    . magnesium oxide (MAG-OX) 400 (241.3 Mg) MG tablet Take 1 tablet (400 mg total) by mouth daily. 14 tablet 0  . oxyCODONE (OXY IR/ROXICODONE) 5 MG immediate release tablet Take 1 tablet (5 mg total) by mouth every 6 (six) hours as needed for severe pain. 60 tablet 0  . albuterol (PROVENTIL HFA;VENTOLIN HFA) 108 (90 Base) MCG/ACT inhaler Inhale 2 puffs into the lungs every 6 (six) hours as needed for wheezing or shortness of breath. (Patient not taking: Reported on 04/23/2018) 1 Inhaler 2  . magic mouthwash w/lidocaine SOLN Take 5 mLs by mouth 4 (four) times daily. (Patient not taking: Reported on 04/02/2018) 240 mL 1  . polyethylene glycol (MIRALAX / GLYCOLAX) packet Take 17 g by mouth daily as needed for mild constipation. (Patient not taking: Reported on 04/23/2018) 30 each 0   No current facility-administered medications for this visit.    Facility-Administered Medications Ordered in Other Visits  Medication Dose Route Frequency Provider Last Rate Last Dose  . heparin lock flush 100 unit/mL  500 Units Intravenous Once [name], MD      . sodium chloride flush (NS) 0.9 % injection 10 mL  10 mL Intravenous Once [name], MD        OBJECTIVE: Vitals:   04/23/18 0934  BP: 104/67  Pulse: 79  Resp: 18  Temp: (!) 95.9 F (35.5 C)     Body mass index is 43.2 kg/m.    ECOG FS:2 - Symptomatic, <50% confined to bed  General: Well-developed, well-nourished, no acute distress.  Sitting in a wheelchair. Eyes: Pink conjunctiva, anicteric sclera. Lungs: Clear to auscultation bilaterally. Heart: Regular rate and rhythm. No rubs, murmurs, or gallops. Abdomen: Soft,  nontender, nondistended. No organomegaly noted, normoactive bowel sounds. Musculoskeletal: No edema, cyanosis, or clubbing. Neuro: Alert, answering all questions appropriately. Cranial nerves grossly intact. Skin: No rashes or petechiae noted. Psych: Normal affect.  LAB RESULTS:  Lab Results  Component Value Date   NA 139 04/23/2018   K 4.5 04/23/2018   CL 107 04/23/2018   CO2 26 04/23/2018   GLUCOSE 125 (H) 04/23/2018   BUN 19 04/23/2018   CREATININE 0.92 04/23/2018   CALCIUM 9.2 04/23/2018   PROT 7.0 04/23/2018   ALBUMIN 3.3 (L) 04/23/2018   AST 18 04/23/2018   ALT 11 (L) 04/23/2018   ALKPHOS 70 04/23/2018   BILITOT 0.8 04/23/2018   GFRNONAA >60 04/23/2018   GFRAA >60 04/23/2018    Lab Results  Component Value Date   WBC 4.1 04/23/2018   NEUTROABS 3.0 04/23/2018   HGB 8.7 (L) 04/23/2018   HCT 25.1 (L) 04/23/2018   MCV 92.7 04/23/2018   PLT 133 (L) 04/23/2018   Lab Results  Component Value Date   IRON 66 02/26/2018   TIBC 289 02/26/2018   IRONPCTSAT 23  02/26/2018    Lab Results  Component Value Date   FERRITIN 178 02/26/2018     STUDIES: Nm Pet Image Restag (ps) Skull Base To Thigh  Result Date: 03/29/2018 CLINICAL DATA:  Subsequent treatment strategy for lung cancer. EXAM: NUCLEAR MEDICINE PET SKULL BASE TO THIGH TECHNIQUE: 13.64 mCi F-18 FDG was injected intravenously. Full-ring PET imaging was performed from the skull base to thigh after the radiotracer. CT data was obtained and used for attenuation correction and anatomic localization. Fasting blood glucose: 122 mg/dl COMPARISON:  Multiple prior PET CTs.  The most recent is 11/30/2017 FINDINGS: Mediastinal blood pool activity: SUV max 3.26 NECK: No hypermetabolic lymph nodes in the neck. Incidental CT findings: none CHEST: Progressive mediastinal tumor. Precarinal adenopathy measures a maximum of 27.5 mm on image number 86 and previously measured 13 mm. The SUV max is 12.75 and was previously 6.52 Subcarinal  nodal mass measures 28.5 mm on image number 98 and previously measured 22.5 mm. SUV max is 10.46 and was previously 10.94 left upper lobe lung mass centrally shows hypermetabolism with SUV max of 6.0. This was previously 4.3. Stable drowned/obstructed left upper lobe. Small scattered pulmonary nodules are stable. Chronic significant interstitial lung disease. Incidental CT findings: none ABDOMEN/PELVIS: No abnormal hypermetabolic activity within the liver, pancreas, adrenal glands, or spleen. No hypermetabolic lymph nodes in the abdomen or pelvis. Incidental CT findings: none SKELETON: No focal hypermetabolic activity to suggest skeletal metastasis. Incidental CT findings: none IMPRESSION: 1. Progressive left suprahilar/left upper lobe tumor. 2. Progressive mediastinal lymphadenopathy. 3. Stable small scattered pulmonary nodules and interstitial lung disease. 4. No findings for metastatic disease involving the abdomen/pelvis or osseous structures. Electronically Signed   By: Marijo Sanes M.D.   On: 03/29/2018 15:40    Oncology treatment history:  Patient completed her initial treatment with concurrent chemotherapy and XRT on March 07, 2016. She then underwent 2 cycles of consolidation carboplatinum and Taxol completing on Apr 27, 2016.  CT scan results from Apr 18, 2017 revealed progression of disease. She subsequently received additional XRT and proceeded with nivolumab every 2 weeks.  She completed a total of 13 infusions of nivolumab on November 06, 2017.  PET scan on November 30, 2017 revealed progression of disease.  Patient started carboplatinum and gemcitabine on December 11, 2017.  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  PET scan results from March 29, 2018 reviewed independently with progressive disease in her left suprahilar/left upper lobe as  well as mediastinal lymphadenopathy.  Delay cycle 2 of single agent Taxotere secondary to decreased performance status.  Unable to perform OmniSeq testing secondary to insufficient tissue.  Return to clinic in 1 week for further evaluation and reconsideration of treatment.  To reimage after cycle 4. 2. Headaches/dizziness: Chronic and unchanged.  Patient does not complain of headache today.  MRI of the brain reviewed independently with no obvious metastatic disease. 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 Continue follow-up with neurosurgery as scheduled. 3. Anemia: Improved after recent blood transfusion.  Monitor.     4. Thrombocytopenia: Chronic and essentially unchanged.  Monitor throughout treatment. 5.  Neutropenia: Resolved.  Secondary to Taxotere.  Will consider udenyca with future treatments. 5. Hyperglycemia: Improved control.  Continue diabetic medications as prescribed.  Appreciate endocrinology input.   6. Pain: Patient does not complain of  this today.  Continue oxycodone as needed. 7.  Hypothyroidism: Continue Synthroid and follow-up with endocrinology as indicated. 8.  Blurry vision: Patient does not complain of this today.  Patient reports she has appointment with ophthalmology in the near future.    Approximately 30 minutes was spent in discussion of which greater than 50% was consultation.   Patient expressed understanding and was in agreement with this plan. She also understands that She can call clinic at any time with any questions, concerns, or complaints.    [name], MD   04/24/2018 1:28 PM

## 2018-04-23 ENCOUNTER — Other Ambulatory Visit: Payer: Self-pay

## 2018-04-23 ENCOUNTER — Ambulatory Visit: Payer: PPO | Admitting: Radiation Oncology

## 2018-04-23 ENCOUNTER — Encounter: Payer: Self-pay | Admitting: Oncology

## 2018-04-23 ENCOUNTER — Inpatient Hospital Stay (HOSPITAL_BASED_OUTPATIENT_CLINIC_OR_DEPARTMENT_OTHER): Payer: PPO | Admitting: Oncology

## 2018-04-23 ENCOUNTER — Inpatient Hospital Stay: Payer: PPO

## 2018-04-23 VITALS — BP 104/67 | HR 79 | Temp 95.9°F | Resp 18 | Wt 251.7 lb

## 2018-04-23 DIAGNOSIS — E039 Hypothyroidism, unspecified: Secondary | ICD-10-CM | POA: Diagnosis not present

## 2018-04-23 DIAGNOSIS — E1165 Type 2 diabetes mellitus with hyperglycemia: Secondary | ICD-10-CM | POA: Diagnosis not present

## 2018-04-23 DIAGNOSIS — R42 Dizziness and giddiness: Secondary | ICD-10-CM | POA: Diagnosis not present

## 2018-04-23 DIAGNOSIS — D696 Thrombocytopenia, unspecified: Secondary | ICD-10-CM

## 2018-04-23 DIAGNOSIS — D701 Agranulocytosis secondary to cancer chemotherapy: Secondary | ICD-10-CM

## 2018-04-23 DIAGNOSIS — R531 Weakness: Secondary | ICD-10-CM | POA: Diagnosis not present

## 2018-04-23 DIAGNOSIS — T451X5A Adverse effect of antineoplastic and immunosuppressive drugs, initial encounter: Secondary | ICD-10-CM

## 2018-04-23 DIAGNOSIS — C3412 Malignant neoplasm of upper lobe, left bronchus or lung: Secondary | ICD-10-CM

## 2018-04-23 DIAGNOSIS — D649 Anemia, unspecified: Secondary | ICD-10-CM

## 2018-04-23 DIAGNOSIS — G620 Drug-induced polyneuropathy: Secondary | ICD-10-CM

## 2018-04-23 DIAGNOSIS — Z5111 Encounter for antineoplastic chemotherapy: Secondary | ICD-10-CM | POA: Diagnosis not present

## 2018-04-23 LAB — CBC WITH DIFFERENTIAL/PLATELET
BASOS ABS: 0 10*3/uL (ref 0–0.1)
BASOS PCT: 1 %
EOS ABS: 0 10*3/uL (ref 0–0.7)
EOS PCT: 1 %
HCT: 25.1 % — ABNORMAL LOW (ref 35.0–47.0)
Hemoglobin: 8.7 g/dL — ABNORMAL LOW (ref 12.0–16.0)
Lymphocytes Relative: 15 %
Lymphs Abs: 0.6 10*3/uL — ABNORMAL LOW (ref 1.0–3.6)
MCH: 32 pg (ref 26.0–34.0)
MCHC: 34.5 g/dL (ref 32.0–36.0)
MCV: 92.7 fL (ref 80.0–100.0)
Monocytes Absolute: 0.4 10*3/uL (ref 0.2–0.9)
Monocytes Relative: 9 %
NEUTROS PCT: 74 %
Neutro Abs: 3 10*3/uL (ref 1.4–6.5)
PLATELETS: 133 10*3/uL — AB (ref 150–440)
RBC: 2.71 MIL/uL — AB (ref 3.80–5.20)
RDW: 16.9 % — ABNORMAL HIGH (ref 11.5–14.5)
WBC: 4.1 10*3/uL (ref 3.6–11.0)

## 2018-04-23 LAB — COMPREHENSIVE METABOLIC PANEL
ALT: 11 U/L — AB (ref 14–54)
AST: 18 U/L (ref 15–41)
Albumin: 3.3 g/dL — ABNORMAL LOW (ref 3.5–5.0)
Alkaline Phosphatase: 70 U/L (ref 38–126)
Anion gap: 6 (ref 5–15)
BILIRUBIN TOTAL: 0.8 mg/dL (ref 0.3–1.2)
BUN: 19 mg/dL (ref 6–20)
CO2: 26 mmol/L (ref 22–32)
CREATININE: 0.92 mg/dL (ref 0.44–1.00)
Calcium: 9.2 mg/dL (ref 8.9–10.3)
Chloride: 107 mmol/L (ref 101–111)
Glucose, Bld: 125 mg/dL — ABNORMAL HIGH (ref 65–99)
Potassium: 4.5 mmol/L (ref 3.5–5.1)
Sodium: 139 mmol/L (ref 135–145)
TOTAL PROTEIN: 7 g/dL (ref 6.5–8.1)

## 2018-04-23 MED ORDER — SODIUM CHLORIDE 0.9% FLUSH
10.0000 mL | Freq: Once | INTRAVENOUS | Status: AC
  Administered 2018-04-23: 10 mL via INTRAVENOUS
  Filled 2018-04-23: qty 10

## 2018-04-23 MED ORDER — OXYCODONE HCL 5 MG PO TABS: 5.0000 mg | ORAL_TABLET | Freq: Four times a day (QID) | ORAL | 0 refills | Status: DC | PRN

## 2018-04-23 MED ORDER — GABAPENTIN 300 MG PO CAPS: 300.0000 mg | ORAL_CAPSULE | Freq: Every day | ORAL | 2 refills | Status: DC

## 2018-04-23 MED ORDER — HEPARIN SOD (PORK) LOCK FLUSH 100 UNIT/ML IV SOLN
500.0000 [IU] | Freq: Once | INTRAVENOUS | Status: AC
  Administered 2018-04-23: 500 [IU] via INTRAVENOUS
  Filled 2018-04-23: qty 5

## 2018-04-23 NOTE — Progress Notes (Signed)
Here for follow up  Per pt " feeling so so -stated feeling weak w pain in R knee/ower leg. Dizzy episodes "  Endurance not good she relayed.

## 2018-04-30 NOTE — Progress Notes (Signed)
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  INTERVAL HISTORY: Patient returns to clinic today for further evaluation and reconsideration of cycle 2 of dose reduced Taxotere.  She currently feels well and nearly back to his baseline. She continues to have occasional dizziness that is chronic and unchanged.  She has no other neurologic complaints. She denies any recent fevers or illnesses. She denies any chest pain, cough, hemoptysis, or shortness of breath. She denies nausea, constipation, diarrhea, or vomiting.  She has no urinary complaints.  Patient offers no further specific complaints today.  REVIEW OF SYSTEMS:   Review of Systems  Constitutional: Positive for malaise/fatigue. Negative for fever and weight loss.  HENT: Negative.  Negative for congestion.   Eyes: Negative.  Negative for blurred vision.  Respiratory: Negative.  Negative for cough, hemoptysis and shortness of breath.   Cardiovascular: Negative.  Negative for chest pain and leg swelling.  Gastrointestinal: Negative.  Negative for abdominal pain and constipation.  Genitourinary: Negative.  Negative for flank pain.  Musculoskeletal: Negative.  Negative for joint pain and myalgias.  Skin: Negative.  Negative for rash.  Neurological: Positive for dizziness, sensory change and weakness. Negative for focal weakness and headaches.  Psychiatric/Behavioral: Negative.  Negative for memory loss. The patient is not nervous/anxious.     As per HPI.  Otherwise, a complete review of systems is negative.  PAST MEDICAL HISTORY: Past Medical History:  Diagnosis Date  . Arthritis   . Blood dyscrasia   . Cancer of left lung ([place]) 08/2015   Rad + chemo tx's.  . Diabetes mellitus without complication ([place])   . Diabetic neuropathy ([place])   . High cholesterol   . Hypertension   . Insulin dependent diabetes mellitus ([place])     PAST SURGICAL HISTORY: Past Surgical History:  Procedure Laterality Date  . ABDOMINAL HYSTERECTOMY    . CESAREAN SECTION    . ELECTROMAGNETIC NAVIGATION BROCHOSCOPY N/A 11/26/2015   Procedure: ELECTROMAGNETIC NAVIGATION BRONCHOSCOPY;  Surgeon: Flora Lipps, MD;  Location: [facility];  Service: Cardiopulmonary;  Laterality: N/A;  . ENDOBRONCHIAL ULTRASOUND N/A 11/26/2015   Procedure: ENDOBRONCHIAL ULTRASOUND;  Surgeon: Flora Lipps, MD;  Location: [facility];  Service: Cardiopulmonary;  Laterality: N/A;  . PERIPHERAL VASCULAR CATHETERIZATION N/A 12/09/2015   Procedure: Glori Luis Cath Insertion;  Surgeon: Katha Cabal, MD;  Location: [facility];  Service: Cardiovascular;  Laterality: N/A;    FAMILY HISTORY: Reviewed and unchanged. No reported history of malignancy or chronic disease.     ADVANCED DIRECTIVES:    HEALTH MAINTENANCE: Social History   Tobacco Use  . Smoking status: Current Every Day Smoker    Packs/day: 0.30    Years: 35.00    Pack years: 10.50    Types: Cigarettes  . Smokeless tobacco: Never Used  . Tobacco comment: workiing on quitting, down to 2 packs per week now  Substance Use Topics  . Alcohol use: No    Alcohol/week: 0.0 oz  . Drug use: No     No Known Allergies  Current Outpatient Medications  Medication Sig Dispense Refill  .  atorvastatin (LIPITOR) 20 MG tablet Take 20 mg by mouth daily at 6 PM.    . gabapentin (NEURONTIN) 300 MG capsule Take 1 capsule (300 mg total) by mouth at bedtime. 30 capsule 2  . insulin glargine (LANTUS) 100 UNIT/ML injection Inject 0.52 mLs (52  Units total) into the skin daily. 10 mL 11  . levothyroxine (SYNTHROID, LEVOTHROID) 150 MCG tablet Take 200 mcg by mouth daily before breakfast.     . lidocaine-prilocaine (EMLA) cream Apply to affected area once 30 g 6  . lisinopril (PRINIVIL,ZESTRIL) 10 MG tablet Take 10 mg by mouth daily.    Marland Kitchen oxyCODONE (OXY IR/ROXICODONE) 5 MG immediate release tablet Take 1 tablet (5 mg total) by mouth every 6 (six) hours as needed for severe pain. 60 tablet 0  . albuterol (PROVENTIL HFA;VENTOLIN HFA) 108 (90 Base) MCG/ACT inhaler Inhale 2 puffs into the lungs every 6 (six) hours as needed for wheezing or shortness of breath. (Patient not taking: Reported on 04/23/2018) 1 Inhaler 2  . magic mouthwash w/lidocaine SOLN Take 5 mLs by mouth 4 (four) times daily. (Patient not taking: Reported on 04/02/2018) 240 mL 1  . magnesium oxide (MAG-OX) 400 (241.3 Mg) MG tablet Take 1 tablet (400 mg total) by mouth daily. (Patient not taking: Reported on 05/02/2018) 14 tablet 0  . polyethylene glycol (MIRALAX / GLYCOLAX) packet Take 17 g by mouth daily as needed for mild constipation. (Patient not taking: Reported on 04/23/2018) 30 each 0   No current facility-administered medications for this visit.    Facility-Administered Medications Ordered in Other Visits  Medication Dose Route Frequency Provider Last Rate Last Dose  . heparin lock flush 100 unit/mL  500 Units Intravenous Once [name], MD      . sodium chloride flush (NS) 0.9 % injection 10 mL  10 mL Intravenous Once [name], MD        OBJECTIVE: Vitals:   05/02/18 0956  BP: 108/64  Pulse: 79  Resp: 20  Temp: (!) 95.2 F (35.1 C)     Body mass index is 43.29 kg/m.    ECOG FS:2 - Symptomatic, <50% confined to bed  General: Well-developed, well-nourished, no acute distress.  Sitting in a wheelchair. Eyes: Pink conjunctiva, anicteric sclera. Lungs: Clear to auscultation bilaterally. Heart: Regular rate and rhythm. No rubs, murmurs, or  gallops. Abdomen: Soft, nontender, nondistended. No organomegaly noted, normoactive bowel sounds. Musculoskeletal: No edema, cyanosis, or clubbing. Neuro: Alert, answering all questions appropriately. Cranial nerves grossly intact. Skin: No rashes or petechiae noted. Psych: Normal affect.  LAB RESULTS:  Lab Results  Component Value Date   NA 139 05/02/2018   K 4.4 05/02/2018   CL 108 05/02/2018   CO2 21 (L) 05/02/2018   GLUCOSE 179 (H) 05/02/2018   BUN 18 05/02/2018   CREATININE 0.97 05/02/2018   CALCIUM 9.0 05/02/2018   PROT 6.8 05/02/2018   ALBUMIN 3.3 (L) 05/02/2018   AST 21 05/02/2018   ALT 13 (L) 05/02/2018   ALKPHOS 78 05/02/2018   BILITOT 0.5 05/02/2018   GFRNONAA 59 (L) 05/02/2018   GFRAA >60 05/02/2018    Lab Results  Component Value Date   WBC 3.5 (L) 05/02/2018   NEUTROABS 2.3 05/02/2018   HGB 8.7 (L) 05/02/2018   HCT 26.0 (L) 05/02/2018   MCV 94.8 05/02/2018   PLT 156 05/02/2018   Lab Results  Component Value Date   IRON 66 02/26/2018   TIBC 289 02/26/2018   IRONPCTSAT 23 02/26/2018  Lab Results  Component Value Date   FERRITIN 178 02/26/2018     STUDIES: No results found.  Oncology treatment history:  Patient completed her initial treatment with concurrent chemotherapy and XRT on March 07, 2016. She then underwent 2 cycles of consolidation carboplatinum and Taxol completing on Apr 27, 2016.  CT scan results from Apr 18, 2017 revealed progression of disease. She subsequently received additional XRT and proceeded with nivolumab every 2 weeks.  She completed a total of 13 infusions of nivolumab on November 06, 2017.  PET scan on November 30, 2017 revealed progression of disease.  Patient started carboplatinum and gemcitabine on December 11, 2017.  Single agent Taxotere was initiated on April 02, 2017.  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 Stage IIIa squamous cell carcinoma of the upper lobe of  left lung: See oncologic treatment history above.  PET scan results from March 29, 2018 reviewed independently with progressive disease in her left suprahilar/left upper lobe as well as mediastinal lymphadenopathy.  Proceed with cycle 2 of dose reduced single agent Taxotere.  Taxotere was dose reduced secondary to performance status.  Unable to perform OmniSeq testing secondary to insufficient tissue.  Return to clinic in 3 weeks for further evaluation and consideration of cycle 3.  Plan to reimage at the conclusion of cycle 4.  2. Headaches/dizziness: Chronic and unchanged.  Patient has known vascular malformation. MRI of the brain reviewed independently with no obvious metastatic disease. 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 Continue follow-up with neurosurgery as scheduled. 3. Anemia: Hemoglobin is decreased, but stable.  Monitor.     4. Thrombocytopenia: Platelet count is now within normal limits.   5.  Neutropenia: Resolved.  Secondary to Taxotere.  Will consider udenyca with future treatments if necessary. 5. Hyperglycemia: Blood glucose elevated today, but overall patient has improved control.  Continue diabetic medications as prescribed.  Appreciate endocrinology input.   6. Pain: Patient does not complain of this today.  Continue oxycodone as needed. 7.  Hypothyroidism: Continue Synthroid and follow-up with endocrinology as indicated. 8.  Blurry vision: Patient does not complain of this today.  Patient reports she has appointment with ophthalmology in the near future.    Approximately 30 minutes spent in discussion of which greater than 50% was consultation.   Patient expressed understanding and was in agreement with this plan. She also understands that She can call clinic at any time with any questions, concerns, or complaints.    [name], MD   05/05/2018 4:48 PM

## 2018-05-01 ENCOUNTER — Other Ambulatory Visit: Payer: Self-pay | Admitting: Oncology

## 2018-05-02 ENCOUNTER — Other Ambulatory Visit: Payer: Self-pay

## 2018-05-02 ENCOUNTER — Inpatient Hospital Stay (HOSPITAL_BASED_OUTPATIENT_CLINIC_OR_DEPARTMENT_OTHER): Payer: PPO | Admitting: Oncology

## 2018-05-02 ENCOUNTER — Inpatient Hospital Stay: Payer: PPO

## 2018-05-02 ENCOUNTER — Ambulatory Visit: Payer: PPO | Attending: Radiation Oncology | Admitting: Radiation Oncology

## 2018-05-02 VITALS — BP 108/64 | HR 79 | Temp 95.2°F | Resp 20 | Wt 252.2 lb

## 2018-05-02 DIAGNOSIS — D696 Thrombocytopenia, unspecified: Secondary | ICD-10-CM

## 2018-05-02 DIAGNOSIS — E1165 Type 2 diabetes mellitus with hyperglycemia: Secondary | ICD-10-CM

## 2018-05-02 DIAGNOSIS — C3412 Malignant neoplasm of upper lobe, left bronchus or lung: Secondary | ICD-10-CM

## 2018-05-02 DIAGNOSIS — D649 Anemia, unspecified: Secondary | ICD-10-CM

## 2018-05-02 DIAGNOSIS — Z5111 Encounter for antineoplastic chemotherapy: Secondary | ICD-10-CM | POA: Diagnosis not present

## 2018-05-02 DIAGNOSIS — E039 Hypothyroidism, unspecified: Secondary | ICD-10-CM | POA: Diagnosis not present

## 2018-05-02 DIAGNOSIS — R51 Headache: Secondary | ICD-10-CM | POA: Diagnosis not present

## 2018-05-02 LAB — COMPREHENSIVE METABOLIC PANEL
ALT: 13 U/L — AB (ref 14–54)
AST: 21 U/L (ref 15–41)
Albumin: 3.3 g/dL — ABNORMAL LOW (ref 3.5–5.0)
Alkaline Phosphatase: 78 U/L (ref 38–126)
Anion gap: 10 (ref 5–15)
BILIRUBIN TOTAL: 0.5 mg/dL (ref 0.3–1.2)
BUN: 18 mg/dL (ref 6–20)
CALCIUM: 9 mg/dL (ref 8.9–10.3)
CO2: 21 mmol/L — ABNORMAL LOW (ref 22–32)
CREATININE: 0.97 mg/dL (ref 0.44–1.00)
Chloride: 108 mmol/L (ref 101–111)
GFR calc Af Amer: >60 mL/min (ref >60)
GFR, EST NON AFRICAN AMERICAN: 59 mL/min — AB (ref >60)
Glucose, Bld: 179 mg/dL — ABNORMAL HIGH (ref 65–99)
Potassium: 4.4 mmol/L (ref 3.5–5.1)
Sodium: 139 mmol/L (ref 135–145)
Total Protein: 6.8 g/dL (ref 6.5–8.1)

## 2018-05-02 LAB — CBC WITH DIFFERENTIAL/PLATELET
Basophils Absolute: 0 10*3/uL (ref 0–0.1)
Basophils Relative: 0 %
EOS ABS: 0.2 10*3/uL (ref 0–0.7)
EOS PCT: 6 %
HCT: 26 % — ABNORMAL LOW (ref 35.0–47.0)
Hemoglobin: 8.7 g/dL — ABNORMAL LOW (ref 12.0–16.0)
Lymphocytes Relative: 19 %
Lymphs Abs: 0.7 10*3/uL — ABNORMAL LOW (ref 1.0–3.6)
MCH: 31.8 pg (ref 26.0–34.0)
MCHC: 33.6 g/dL (ref 32.0–36.0)
MCV: 94.8 fL (ref 80.0–100.0)
Monocytes Absolute: 0.3 10*3/uL (ref 0.2–0.9)
Monocytes Relative: 10 %
Neutro Abs: 2.3 10*3/uL (ref 1.4–6.5)
Neutrophils Relative %: 65 %
PLATELETS: 156 10*3/uL (ref 150–440)
RBC: 2.75 MIL/uL — AB (ref 3.80–5.20)
RDW: 17.2 % — ABNORMAL HIGH (ref 11.5–14.5)
WBC: 3.5 10*3/uL — AB (ref 3.6–11.0)

## 2018-05-02 LAB — SAMPLE TO BLOOD BANK

## 2018-05-02 MED ORDER — DEXAMETHASONE SODIUM PHOSPHATE 10 MG/ML IJ SOLN
10.0000 mg | Freq: Once | INTRAMUSCULAR | Status: AC
  Administered 2018-05-02: 10 mg via INTRAVENOUS
  Filled 2018-05-02: qty 1

## 2018-05-02 MED ORDER — DOCETAXEL CHEMO INJECTION 160 MG/16ML
67.0000 mg/m2 | Freq: Once | INTRAVENOUS | Status: AC
  Administered 2018-05-02: 150 mg via INTRAVENOUS
  Filled 2018-05-02: qty 15

## 2018-05-02 MED ORDER — SODIUM CHLORIDE 0.9 % IV SOLN
Freq: Once | INTRAVENOUS | Status: AC
  Administered 2018-05-02: 11:00:00 via INTRAVENOUS
  Filled 2018-05-02: qty 1000

## 2018-05-02 MED ORDER — SODIUM CHLORIDE 0.9% FLUSH
10.0000 mL | INTRAVENOUS | Status: DC | PRN
  Administered 2018-05-02: 10 mL via INTRAVENOUS
  Filled 2018-05-02: qty 10

## 2018-05-02 MED ORDER — HEPARIN SOD (PORK) LOCK FLUSH 100 UNIT/ML IV SOLN
500.0000 [IU] | Freq: Once | INTRAVENOUS | Status: AC
  Administered 2018-05-02: 500 [IU] via INTRAVENOUS
  Filled 2018-05-02: qty 5

## 2018-05-02 NOTE — Progress Notes (Signed)
Here for follow up. Stated "feeling better today"

## 2018-05-07 DIAGNOSIS — G935 Compression of brain: Secondary | ICD-10-CM | POA: Diagnosis not present

## 2018-05-07 DIAGNOSIS — M50322 Other cervical disc degeneration at C5-C6 level: Secondary | ICD-10-CM | POA: Diagnosis not present

## 2018-05-07 DIAGNOSIS — M50321 Other cervical disc degeneration at C4-C5 level: Secondary | ICD-10-CM | POA: Diagnosis not present

## 2018-05-08 ENCOUNTER — Telehealth: Payer: Self-pay | Admitting: *Deleted

## 2018-05-08 DIAGNOSIS — C3412 Malignant neoplasm of upper lobe, left bronchus or lung: Secondary | ICD-10-CM | POA: Diagnosis not present

## 2018-05-08 DIAGNOSIS — C3411 Malignant neoplasm of upper lobe, right bronchus or lung: Secondary | ICD-10-CM | POA: Diagnosis not present

## 2018-05-08 MED ORDER — NYSTATIN 100000 UNIT/GM EX POWD: Freq: Four times a day (QID) | CUTANEOUS | 0 refills | Status: AC

## 2018-05-08 NOTE — Telephone Encounter (Signed)
Misty with [facility] called reporting patient has a yeasty rash under her right breast. She is requesting an order for Nystatin powder for it and it be sent to pharmacy

## 2018-05-08 NOTE — Telephone Encounter (Signed)
That's fine

## 2018-05-08 NOTE — Telephone Encounter (Signed)
Prescription sent and Misty informed

## 2018-05-19 ENCOUNTER — Other Ambulatory Visit: Payer: Self-pay | Admitting: Oncology

## 2018-05-20 NOTE — Progress Notes (Signed)
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  INTERVAL HISTORY: Patient returns to clinic today for further evaluation and consideration of cycle 3 of dose reduced Taxotere.  She tolerated her treatments significantly better after a dose reduction.  She continues to have pain, but it is well controlled on her current narcotic regimen. She continues to have occasional dizziness that is chronic and unchanged.  She has no other neurologic complaints. She denies any recent fevers or illnesses. She denies any chest pain, cough, hemoptysis, or shortness of breath. She denies nausea, constipation, diarrhea, or vomiting.  She has no urinary complaints.  Patient offers no further specific complaints today.  REVIEW OF SYSTEMS:   Review of Systems  Constitutional: Negative.  Negative for fever, malaise/fatigue and weight loss.  HENT: Negative.  Negative for congestion.   Eyes: Negative.  Negative for blurred vision.  Respiratory: Negative.  Negative for cough, hemoptysis and shortness of breath.   Cardiovascular: Negative.  Negative for chest pain and leg swelling.  Gastrointestinal: Negative.  Negative for abdominal pain and constipation.  Genitourinary: Negative.  Negative for flank pain.  Musculoskeletal: Negative.  Negative for joint pain and myalgias.  Skin: Negative.  Negative for rash.  Neurological: Positive for dizziness. Negative for sensory change, focal weakness, weakness and headaches.  Psychiatric/Behavioral:  Negative.  Negative for memory loss. The patient is not nervous/anxious.     As per HPI. Otherwise, a complete review of systems is negative.  PAST MEDICAL HISTORY: Past Medical History:  Diagnosis Date  . Arthritis   . Blood dyscrasia   . Cancer of left lung ([employer]) 08/2015   Rad + chemo tx's.  . Diabetes mellitus without complication ([place])   . Diabetic neuropathy ([place])   . High cholesterol   . Hypertension   . Insulin dependent diabetes mellitus ([place])     PAST SURGICAL HISTORY: Past Surgical History:  Procedure Laterality Date  . ABDOMINAL HYSTERECTOMY    . CESAREAN SECTION    . ELECTROMAGNETIC NAVIGATION BROCHOSCOPY N/A 11/26/2015   Procedure: ELECTROMAGNETIC NAVIGATION BRONCHOSCOPY;  Surgeon: Flora Lipps, MD;  Location: [facility];  Service: Cardiopulmonary;  Laterality: N/A;  . ENDOBRONCHIAL ULTRASOUND N/A 11/26/2015   Procedure: ENDOBRONCHIAL ULTRASOUND;  Surgeon: Flora Lipps, MD;  Location: [facility] OR[facility];  Service: Cardiopulmonary;  Laterality: N/A;  . PERIPHERAL VASCULAR CATHETERIZATION N/A 12/09/2015   Procedure: Glori Luis Cath Insertion;  Surgeon: Katha Cabal, MD;  Location: [place] [facility];  Service: Cardiovascular;  Laterality: N/A;    FAMILY HISTORY: Reviewed and unchanged. No reported history of malignancy or chronic disease.     ADVANCED DIRECTIVES:    HEALTH MAINTENANCE: Social History   Tobacco Use  . Smoking status: Current Every Day Smoker    Packs/day: 0.30    Years: 35.00    Pack years: 10.50    Types: Cigarettes  . Smokeless tobacco: Never Used  . Tobacco comment: workiing on quitting, down to 2 packs per week now  Substance Use Topics  . Alcohol use: No    Alcohol/week: 0.0 oz  . Drug use: No  No Known Allergies  Current Outpatient Medications  Medication Sig Dispense Refill  . albuterol (PROVENTIL HFA;VENTOLIN HFA) 108 (90 Base) MCG/ACT inhaler Inhale 2 puffs into the lungs every 6 (six) hours as needed for wheezing or shortness of  breath. 1 Inhaler 2  . atorvastatin (LIPITOR) 20 MG tablet Take 20 mg by mouth daily at 6 PM.    . gabapentin (NEURONTIN) 300 MG capsule Take 1 capsule (300 mg total) by mouth at bedtime. 30 capsule 2  . insulin glargine (LANTUS) 100 UNIT/ML injection Inject 0.52 mLs (52 Units total) into the skin daily. 10 mL 11  . levothyroxine (SYNTHROID, LEVOTHROID) 150 MCG tablet Take 200 mcg by mouth daily before breakfast.     . lidocaine-prilocaine (EMLA) cream Apply to affected area once 30 g 6  . lisinopril (PRINIVIL,ZESTRIL) 10 MG tablet Take 10 mg by mouth daily.    . magic mouthwash w/lidocaine SOLN Take 5 mLs by mouth 4 (four) times daily. 240 mL 1  . magnesium oxide (MAG-OX) 400 (241.3 Mg) MG tablet Take 1 tablet (400 mg total) by mouth daily. 14 tablet 0  . nystatin (MYCOSTATIN/NYSTOP) powder Apply topically 4 (four) times daily. 15 g 0  . oxyCODONE (OXY IR/ROXICODONE) 5 MG immediate release tablet Take 1 tablet (5 mg total) by mouth every 6 (six) hours as needed for severe pain. 60 tablet 0  . polyethylene glycol (MIRALAX / GLYCOLAX) packet Take 17 g by mouth daily as needed for mild constipation. 30 each 0   No current facility-administered medications for this visit.    Facility-Administered Medications Ordered in Other Visits  Medication Dose Route Frequency Provider Last Rate Last Dose  . heparin lock flush 100 unit/mL  500 Units Intravenous Once [name], MD      . sodium chloride flush (NS) 0.9 % injection 10 mL  10 mL Intravenous Once [name], MD        OBJECTIVE: Vitals:   05/21/18 0939  BP: 110/63  Pulse: 81  Resp: 18  Temp: (!) 95.4 F (35.2 C)     Body mass index is 43.86 kg/m.    ECOG FS:1 - Symptomatic but completely ambulatory  General: Well-developed, well-nourished, no acute distress.  Sitting in a wheelchair. Eyes: Pink conjunctiva, anicteric sclera. Lungs: Clear to auscultation bilaterally. Heart: Regular rate and rhythm. No rubs, murmurs,  or gallops. Abdomen: Soft, nontender, nondistended. No organomegaly noted, normoactive bowel sounds. Musculoskeletal: No edema, cyanosis, or clubbing. Neuro: Alert, answering all questions appropriately. Cranial nerves grossly intact. Skin: No rashes or petechiae noted. Psych: Normal affect.   LAB RESULTS:  Lab Results  Component Value Date   NA 139 05/21/2018   K 4.1 05/21/2018   CL 108 05/21/2018   CO2 24 05/21/2018   GLUCOSE 177 (H) 05/21/2018   BUN 14 05/21/2018   CREATININE 0.84 05/21/2018   CALCIUM 8.9 05/21/2018   PROT 6.7 05/21/2018   ALBUMIN 3.4 (L) 05/21/2018   AST 23 05/21/2018   ALT 11 (L) 05/21/2018   ALKPHOS 63 05/21/2018   BILITOT 0.6 05/21/2018   GFRNONAA >60 05/21/2018   GFRAA >60 05/21/2018    Lab Results  Component Value Date   WBC 3.5 (L) 05/21/2018   NEUTROABS 2.7 05/21/2018   HGB 9.0 (L) 05/21/2018   HCT 26.8 (L) 05/21/2018   MCV 92.4 05/21/2018   PLT 136 (L) 05/21/2018   Lab Results  Component Value Date   IRON 66 02/26/2018   TIBC 289 02/26/2018   IRONPCTSAT  23 02/26/2018    Lab Results  Component Value Date   FERRITIN 178 02/26/2018     STUDIES: No results found.  Oncology treatment history:  Patient completed her initial treatment with concurrent chemotherapy and XRT on March 07, 2016. She then underwent 2 cycles of consolidation carboplatinum and Taxol completing on Apr 27, 2016.  CT scan results from Apr 18, 2017 revealed progression of disease. She subsequently received additional XRT and proceeded with nivolumab every 2 weeks.  She completed a total of 13 infusions of nivolumab on November 06, 2017.  PET scan on November 30, 2017 revealed progression of disease.  Patient started carboplatinum and gemcitabine on December 11, 2017.  Single agent Taxotere was initiated on April 02, 2017.  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 Stage IIIa squamous cell carcinoma of the upper lobe of  left lung: See oncologic treatment history above.  PET scan results from March 29, 2018 reviewed independently with progressive disease in her left suprahilar/left upper lobe as well as mediastinal lymphadenopathy.  Proceed with cycle 3 of dose reduced single agent Taxotere. Unable to perform OmniSeq testing secondary to insufficient tissue.  Return to clinic in 3 weeks for further evaluation and consideration of cycle 4.  Will reimage at the conclusion of cycle 4. 2. Headaches/dizziness: Chronic and unchanged.  Patient has known vascular malformation. MRI of the brain reviewed independently with no obvious metastatic disease. 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  Patient reports she has an MRI with neurosurgery in the near future.  Continue follow-up as indicated. 3. Anemia: Hemoglobin is decreased, but stable.  Monitor.     4. Thrombocytopenia: Platelet count is trending down slightly, monitor.  Proceed with treatment as above. 5.  Leukopenia: Mild, proceed with dose reduction as above. 5. Hyperglycemia: Blood glucose elevated today, but overall patient has improved control.  Continue diabetic medications as prescribed.  Appreciate endocrinology input.   6. Pain: Chronic and well controlled.  Continue oxycodone as needed.  Patient was given a refill today. 7.  Hypothyroidism: Continue Synthroid and follow-up with endocrinology as indicated.   Patient expressed understanding and was in agreement with this plan. She also understands that She can call clinic at any time with any questions, concerns, or complaints.    [name], MD   05/25/2018 5:30 PM

## 2018-05-21 ENCOUNTER — Inpatient Hospital Stay: Payer: PPO

## 2018-05-21 ENCOUNTER — Inpatient Hospital Stay (HOSPITAL_BASED_OUTPATIENT_CLINIC_OR_DEPARTMENT_OTHER): Payer: PPO | Admitting: Oncology

## 2018-05-21 ENCOUNTER — Other Ambulatory Visit: Payer: Self-pay

## 2018-05-21 ENCOUNTER — Inpatient Hospital Stay: Payer: PPO | Attending: Oncology

## 2018-05-21 VITALS — BP 110/63 | HR 81 | Temp 95.4°F | Resp 18 | Wt 255.5 lb

## 2018-05-21 DIAGNOSIS — R42 Dizziness and giddiness: Secondary | ICD-10-CM | POA: Diagnosis not present

## 2018-05-21 DIAGNOSIS — E1165 Type 2 diabetes mellitus with hyperglycemia: Secondary | ICD-10-CM

## 2018-05-21 DIAGNOSIS — D72819 Decreased white blood cell count, unspecified: Secondary | ICD-10-CM | POA: Insufficient documentation

## 2018-05-21 DIAGNOSIS — Z5111 Encounter for antineoplastic chemotherapy: Secondary | ICD-10-CM | POA: Diagnosis not present

## 2018-05-21 DIAGNOSIS — R51 Headache: Secondary | ICD-10-CM

## 2018-05-21 DIAGNOSIS — D649 Anemia, unspecified: Secondary | ICD-10-CM | POA: Insufficient documentation

## 2018-05-21 DIAGNOSIS — C3412 Malignant neoplasm of upper lobe, left bronchus or lung: Secondary | ICD-10-CM | POA: Diagnosis not present

## 2018-05-21 DIAGNOSIS — E039 Hypothyroidism, unspecified: Secondary | ICD-10-CM | POA: Diagnosis not present

## 2018-05-21 LAB — COMPREHENSIVE METABOLIC PANEL
ALBUMIN: 3.4 g/dL — AB (ref 3.5–5.0)
ALK PHOS: 63 U/L (ref 38–126)
ALT: 11 U/L — ABNORMAL LOW (ref 14–54)
AST: 23 U/L (ref 15–41)
Anion gap: 7 (ref 5–15)
BILIRUBIN TOTAL: 0.6 mg/dL (ref 0.3–1.2)
BUN: 14 mg/dL (ref 6–20)
CALCIUM: 8.9 mg/dL (ref 8.9–10.3)
CO2: 24 mmol/L (ref 22–32)
Chloride: 108 mmol/L (ref 101–111)
Creatinine, Ser: 0.84 mg/dL (ref 0.44–1.00)
GFR calc Af Amer: >60 mL/min (ref >60)
GLUCOSE: 177 mg/dL — AB (ref 65–99)
POTASSIUM: 4.1 mmol/L (ref 3.5–5.1)
Sodium: 139 mmol/L (ref 135–145)
TOTAL PROTEIN: 6.7 g/dL (ref 6.5–8.1)

## 2018-05-21 LAB — CBC WITH DIFFERENTIAL/PLATELET
BASOS ABS: 0 10*3/uL (ref 0–0.1)
BASOS PCT: 1 %
EOS ABS: 0 10*3/uL (ref 0–0.7)
EOS PCT: 0 %
HCT: 26.8 % — ABNORMAL LOW (ref 35.0–47.0)
Hemoglobin: 9 g/dL — ABNORMAL LOW (ref 12.0–16.0)
LYMPHS PCT: 17 %
Lymphs Abs: 0.6 10*3/uL — ABNORMAL LOW (ref 1.0–3.6)
MCH: 31.2 pg (ref 26.0–34.0)
MCHC: 33.7 g/dL (ref 32.0–36.0)
MCV: 92.4 fL (ref 80.0–100.0)
MONO ABS: 0.3 10*3/uL (ref 0.2–0.9)
Monocytes Relative: 7 %
Neutro Abs: 2.7 10*3/uL (ref 1.4–6.5)
Neutrophils Relative %: 75 %
PLATELETS: 136 10*3/uL — AB (ref 150–440)
RBC: 2.9 MIL/uL — AB (ref 3.80–5.20)
RDW: 16.7 % — AB (ref 11.5–14.5)
WBC: 3.5 10*3/uL — AB (ref 3.6–11.0)

## 2018-05-21 LAB — SAMPLE TO BLOOD BANK

## 2018-05-21 MED ORDER — SODIUM CHLORIDE 0.9% FLUSH
10.0000 mL | INTRAVENOUS | Status: DC | PRN
  Filled 2018-05-21: qty 10

## 2018-05-21 MED ORDER — SODIUM CHLORIDE 0.9 % IV SOLN
67.0000 mg/m2 | Freq: Once | INTRAVENOUS | Status: AC
  Administered 2018-05-21: 150 mg via INTRAVENOUS
  Filled 2018-05-21: qty 15

## 2018-05-21 MED ORDER — SODIUM CHLORIDE 0.9 % IV SOLN
Freq: Once | INTRAVENOUS | Status: AC
  Administered 2018-05-21: 11:00:00 via INTRAVENOUS
  Filled 2018-05-21: qty 1000

## 2018-05-21 MED ORDER — HEPARIN SOD (PORK) LOCK FLUSH 100 UNIT/ML IV SOLN
500.0000 [IU] | Freq: Once | INTRAVENOUS | Status: AC
  Administered 2018-05-21: 500 [IU] via INTRAVENOUS
  Filled 2018-05-21: qty 5

## 2018-05-21 MED ORDER — SODIUM CHLORIDE 0.9 % IV SOLN: 10.0000 mg | Freq: Once | INTRAVENOUS | Status: DC

## 2018-05-21 MED ORDER — OXYCODONE HCL 5 MG PO TABS: 5.0000 mg | ORAL_TABLET | Freq: Four times a day (QID) | ORAL | 0 refills | Status: DC | PRN

## 2018-05-21 MED ORDER — SODIUM CHLORIDE 0.9% FLUSH
10.0000 mL | INTRAVENOUS | Status: DC | PRN
  Administered 2018-05-21: 10 mL via INTRAVENOUS
  Filled 2018-05-21: qty 10

## 2018-05-21 MED ORDER — HEPARIN SOD (PORK) LOCK FLUSH 100 UNIT/ML IV SOLN: 500.0000 [IU] | Freq: Once | INTRAVENOUS | Status: DC | PRN

## 2018-05-21 MED ORDER — DEXAMETHASONE SODIUM PHOSPHATE 10 MG/ML IJ SOLN
10.0000 mg | Freq: Once | INTRAMUSCULAR | Status: AC
  Administered 2018-05-21: 10 mg via INTRAVENOUS
  Filled 2018-05-21: qty 1

## 2018-05-21 NOTE — Progress Notes (Signed)
Here for follow up. Per pt " feeling better -walking ,better appetite,attitude better " oxycodone 5 mg immed release  med-pended for renewal.

## 2018-06-01 DIAGNOSIS — G935 Compression of brain: Secondary | ICD-10-CM | POA: Diagnosis not present

## 2018-06-09 NOTE — Progress Notes (Signed)
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  INTERVAL HISTORY: Patient returns to clinic today for further evaluation and consideration of cycle 4 of dose reduced Taxotere.  Since the dose reduction, she is tolerating her treatments much better.  She has a mild peripheral neuropathy that does not affect her day-to-day activity.  She continues to have pain, but it is well controlled on her current narcotic regimen. She continues to have occasional dizziness that is chronic and unchanged.  She has no other neurologic complaints. She denies any recent fevers or illnesses. She denies any chest pain, cough, hemoptysis, or shortness of breath. She denies nausea, constipation, diarrhea, or vomiting.  She has no urinary complaints.  Patient offers no further specific complaints today.  REVIEW OF SYSTEMS:   Review of Systems  Constitutional: Negative.  Negative for fever, malaise/fatigue and weight loss.  HENT: Negative.  Negative for congestion.   Eyes: Negative.  Negative for blurred vision.  Respiratory: Negative.  Negative for cough, hemoptysis and shortness of breath.   Cardiovascular: Negative.  Negative for chest pain and leg swelling.  Gastrointestinal: Negative.  Negative for abdominal pain and constipation.  Genitourinary: Negative.  Negative for flank pain.  Musculoskeletal: Negative.  Negative for joint pain and myalgias.  Skin: Negative.  Negative for rash.  Neurological: Positive for dizziness, tingling and  sensory change. Negative for focal weakness, weakness and headaches.  Psychiatric/Behavioral: Negative.  Negative for memory loss. The patient is not nervous/anxious.     As per HPI. Otherwise, a complete review of systems is negative.  PAST MEDICAL HISTORY: Past Medical History:  Diagnosis Date  . Arthritis   . Blood dyscrasia   . Cancer of left lung ([place]) 08/2015   Rad + chemo tx's.  . Diabetes mellitus without complication ([place])   . Diabetic neuropathy ([place])   . High cholesterol   . Hypertension   . Insulin dependent diabetes mellitus ([place])     PAST SURGICAL HISTORY: Past Surgical History:  Procedure Laterality Date  . ABDOMINAL HYSTERECTOMY    . CESAREAN SECTION    . ELECTROMAGNETIC NAVIGATION BROCHOSCOPY N/A 11/26/2015   Procedure: ELECTROMAGNETIC NAVIGATION BRONCHOSCOPY;  Surgeon: Flora Lipps, MD;  Location: [facility];  Service: Cardiopulmonary;  Laterality: N/A;  . ENDOBRONCHIAL ULTRASOUND N/A 11/26/2015   Procedure: ENDOBRONCHIAL ULTRASOUND;  Surgeon: Flora Lipps, MD;  Location: [facility];  Service: Cardiopulmonary;  Laterality: N/A;  . PERIPHERAL VASCULAR CATHETERIZATION N/A 12/09/2015   Procedure: Glori Luis Cath Insertion;  Surgeon: Katha Cabal, MD;  Location: [place] [facility];  Service: Cardiovascular;  Laterality: N/A;    FAMILY HISTORY: Reviewed and unchanged. No reported history of malignancy or chronic disease.     ADVANCED DIRECTIVES:    HEALTH MAINTENANCE: Social History   Tobacco Use  . Smoking status: Current Every Day Smoker    Packs/day: 0.30    Years: 35.00    Pack years: 10.50    Types: Cigarettes  . Smokeless tobacco: Never Used  . Tobacco comment: workiing on quitting, down to 2 packs per week now  Substance Use Topics  .  Alcohol use: No    Alcohol/week: 0.0 oz  . Drug use: No     No Known Allergies  Current Outpatient Medications  Medication Sig Dispense Refill  . albuterol (PROVENTIL HFA;VENTOLIN HFA) 108 (90 Base) MCG/ACT  inhaler Inhale 2 puffs into the lungs every 6 (six) hours as needed for wheezing or shortness of breath. 1 Inhaler 2  . atorvastatin (LIPITOR) 20 MG tablet Take 20 mg by mouth daily at 6 PM.    . gabapentin (NEURONTIN) 300 MG capsule Take 1 capsule (300 mg total) by mouth at bedtime. 30 capsule 2  . insulin glargine (LANTUS) 100 UNIT/ML injection Inject 0.52 mLs (52 Units total) into the skin daily. 10 mL 11  . levothyroxine (SYNTHROID, LEVOTHROID) 150 MCG tablet Take 200 mcg by mouth daily before breakfast.     . lidocaine-prilocaine (EMLA) cream Apply to affected area once 30 g 6  . lisinopril (PRINIVIL,ZESTRIL) 10 MG tablet Take 10 mg by mouth daily.    . magic mouthwash w/lidocaine SOLN Take 5 mLs by mouth 4 (four) times daily. 240 mL 1  . magnesium oxide (MAG-OX) 400 (241.3 Mg) MG tablet Take 1 tablet (400 mg total) by mouth daily. 14 tablet 0  . nystatin (MYCOSTATIN/NYSTOP) powder Apply topically 4 (four) times daily. 15 g 0  . oxyCODONE (OXY IR/ROXICODONE) 5 MG immediate release tablet Take 1 tablet (5 mg total) by mouth every 6 (six) hours as needed for severe pain. 60 tablet 0  . polyethylene glycol (MIRALAX / GLYCOLAX) packet Take 17 g by mouth daily as needed for mild constipation. 30 each 0   No current facility-administered medications for this visit.    Facility-Administered Medications Ordered in Other Visits  Medication Dose Route Frequency Provider Last Rate Last Dose  . heparin lock flush 100 unit/mL  500 Units Intravenous Once [name], MD      . heparin lock flush 100 unit/mL  500 Units Intracatheter Once PRN [name], MD      . sodium chloride flush (NS) 0.9 % injection 10 mL  10 mL Intravenous Once [name], MD      . sodium chloride flush (NS) 0.9 % injection 10 mL  10 mL Intravenous PRN [name], MD   10 mL at 06/11/18 0851  . sodium chloride flush (NS) 0.9 % injection 10 mL  10 mL Intracatheter PRN [name], MD          OBJECTIVE: Vitals:   06/11/18 0904  BP: 108/64  Pulse: 78  Resp: 20  Temp: (!) 97 F (36.1 C)  SpO2: 98%     Body mass index is 43.94 kg/m.    ECOG FS:1 - Symptomatic but completely ambulatory  General: Well-developed, well-nourished, no acute distress. Eyes: Pink conjunctiva, anicteric sclera. HEENT: Normocephalic, moist mucous membranes, clear oropharnyx. Lungs: Clear to auscultation bilaterally. Heart: Regular rate and rhythm. No rubs, murmurs, or gallops. Abdomen: Soft, nontender, nondistended. No organomegaly noted, normoactive bowel sounds. Musculoskeletal: No edema, cyanosis, or clubbing. Neuro: Alert, answering all questions appropriately. Cranial nerves grossly intact. Skin: No rashes or petechiae noted. Psych: Normal affect.   LAB RESULTS:  Lab Results  Component Value Date   NA 140 06/11/2018   K 4.1 06/11/2018   CL 109 06/11/2018   CO2 23 06/11/2018   GLUCOSE 176 (H) 06/11/2018   BUN 11 06/11/2018   CREATININE 1.01 (H) 06/11/2018   CALCIUM 8.5 (L) 06/11/2018   PROT 6.4 (L) 06/11/2018  ALBUMIN 3.3 (L) 06/11/2018   AST 21 06/11/2018   ALT 12 06/11/2018   ALKPHOS 63 06/11/2018   BILITOT 0.5 06/11/2018   GFRNONAA 56 (L) 06/11/2018   GFRAA >60 06/11/2018    Lab Results  Component Value Date   WBC 4.5 06/11/2018   NEUTROABS 3.5 06/11/2018   HGB 8.6 (L) 06/11/2018   HCT 25.5 (L) 06/11/2018   MCV 92.0 06/11/2018   PLT 121 (L) 06/11/2018   Lab Results  Component Value Date   IRON 66 02/26/2018   TIBC 289 02/26/2018   IRONPCTSAT 23 02/26/2018    Lab Results  Component Value Date   FERRITIN 178 02/26/2018     STUDIES: No results found.  Oncology treatment history:  Patient completed her initial treatment with concurrent chemotherapy and XRT on March 07, 2016. She then underwent 2 cycles of consolidation carboplatinum and Taxol completing on Apr 27, 2016.  CT scan results from Apr 18, 2017 revealed progression of disease. She subsequently  received additional XRT and proceeded with nivolumab every 2 weeks.  She completed a total of 13 infusions of nivolumab on November 06, 2017.  PET scan on November 30, 2017 revealed progression of disease.  Patient started carboplatinum and gemcitabine on December 11, 2017.  Single agent Taxotere was initiated on April 02, 2017.  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  PET scan results from March 29, 2018 reviewed independently with progressive disease in her left suprahilar/left upper lobe as well as mediastinal lymphadenopathy.  Proceed with cycle 4 of dose reduced Taxotere today. Unable to perform OmniSeq testing secondary to insufficient tissue.  Return to clinic in 3 weeks for further evaluation and consideration of cycle 5.  We will get a PET scan 1 to 2 days prior to her treatment.  2. Headaches/dizziness: Chronic and unchanged.  Patient has known vascular malformation. MRI of the brain reviewed independently with no obvious metastatic disease. 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  Patient reports she has an MRI with neurosurgery in the near future.  Continue follow-up with neurosurgery as indicated. 3. Anemia: Patient's hemoglobin has trended down slightly to 8.6.  She does not require blood transfusion at this time.     4. Thrombocytopenia: Mild, proceed with treatment as above. 5.  Leukopenia: Resolved. 5. Hyperglycemia: Patient's blood glucose remains chronically elevated, but overall she has improved control.  Continue diabetic medications as prescribed.  Appreciate endocrinology input.   6. Pain: Chronic and well controlled.  Continue oxycodone as needed.  7.  Hypothyroidism: Continue Synthroid and follow-up with endocrinology as  indicated.   Patient expressed understanding and was in agreement with this plan. She also understands that She can call clinic at any time with any questions, concerns, or complaints.    [name], MD   06/11/2018 1:29 PM

## 2018-06-11 ENCOUNTER — Inpatient Hospital Stay (HOSPITAL_BASED_OUTPATIENT_CLINIC_OR_DEPARTMENT_OTHER): Payer: PPO | Admitting: Oncology

## 2018-06-11 ENCOUNTER — Encounter: Payer: Self-pay | Admitting: Oncology

## 2018-06-11 ENCOUNTER — Inpatient Hospital Stay: Payer: PPO | Attending: Oncology

## 2018-06-11 ENCOUNTER — Inpatient Hospital Stay: Payer: PPO

## 2018-06-11 VITALS — BP 108/64 | HR 78 | Temp 97.0°F | Resp 20 | Wt 256.0 lb

## 2018-06-11 DIAGNOSIS — D696 Thrombocytopenia, unspecified: Secondary | ICD-10-CM | POA: Diagnosis not present

## 2018-06-11 DIAGNOSIS — C3412 Malignant neoplasm of upper lobe, left bronchus or lung: Secondary | ICD-10-CM | POA: Insufficient documentation

## 2018-06-11 DIAGNOSIS — D649 Anemia, unspecified: Secondary | ICD-10-CM

## 2018-06-11 DIAGNOSIS — E1165 Type 2 diabetes mellitus with hyperglycemia: Secondary | ICD-10-CM | POA: Diagnosis not present

## 2018-06-11 DIAGNOSIS — I6523 Occlusion and stenosis of bilateral carotid arteries: Secondary | ICD-10-CM | POA: Diagnosis not present

## 2018-06-11 DIAGNOSIS — Z5111 Encounter for antineoplastic chemotherapy: Secondary | ICD-10-CM | POA: Diagnosis not present

## 2018-06-11 DIAGNOSIS — E039 Hypothyroidism, unspecified: Secondary | ICD-10-CM | POA: Diagnosis not present

## 2018-06-11 LAB — COMPREHENSIVE METABOLIC PANEL
ALBUMIN: 3.3 g/dL — AB (ref 3.5–5.0)
ALK PHOS: 63 U/L (ref 38–126)
ALT: 12 U/L (ref 0–44)
AST: 21 U/L (ref 15–41)
Anion gap: 8 (ref 5–15)
BUN: 11 mg/dL (ref 8–23)
CALCIUM: 8.5 mg/dL — AB (ref 8.9–10.3)
CO2: 23 mmol/L (ref 22–32)
CREATININE: 1.01 mg/dL — AB (ref 0.44–1.00)
Chloride: 109 mmol/L (ref 98–111)
GFR calc non Af Amer: 56 mL/min — ABNORMAL LOW (ref >60)
GLUCOSE: 176 mg/dL — AB (ref 70–99)
Potassium: 4.1 mmol/L (ref 3.5–5.1)
SODIUM: 140 mmol/L (ref 135–145)
Total Bilirubin: 0.5 mg/dL (ref 0.3–1.2)
Total Protein: 6.4 g/dL — ABNORMAL LOW (ref 6.5–8.1)

## 2018-06-11 LAB — CBC WITH DIFFERENTIAL/PLATELET
BASOS PCT: 1 %
Basophils Absolute: 0 10*3/uL (ref 0–0.1)
Eosinophils Absolute: 0 10*3/uL (ref 0–0.7)
Eosinophils Relative: 0 %
HEMATOCRIT: 25.5 % — AB (ref 35.0–47.0)
Hemoglobin: 8.6 g/dL — ABNORMAL LOW (ref 12.0–16.0)
Lymphocytes Relative: 16 %
Lymphs Abs: 0.7 10*3/uL — ABNORMAL LOW (ref 1.0–3.6)
MCH: 30.9 pg (ref 26.0–34.0)
MCHC: 33.6 g/dL (ref 32.0–36.0)
MCV: 92 fL (ref 80.0–100.0)
MONO ABS: 0.3 10*3/uL (ref 0.2–0.9)
MONOS PCT: 7 %
NEUTROS ABS: 3.5 10*3/uL (ref 1.4–6.5)
Neutrophils Relative %: 76 %
Platelets: 121 10*3/uL — ABNORMAL LOW (ref 150–440)
RBC: 2.77 MIL/uL — ABNORMAL LOW (ref 3.80–5.20)
RDW: 17.2 % — AB (ref 11.5–14.5)
WBC: 4.5 10*3/uL (ref 3.6–11.0)

## 2018-06-11 LAB — SAMPLE TO BLOOD BANK

## 2018-06-11 MED ORDER — SODIUM CHLORIDE 0.9 % IV SOLN
67.0000 mg/m2 | Freq: Once | INTRAVENOUS | Status: AC
  Administered 2018-06-11: 150 mg via INTRAVENOUS
  Filled 2018-06-11: qty 15

## 2018-06-11 MED ORDER — HEPARIN SOD (PORK) LOCK FLUSH 100 UNIT/ML IV SOLN: 500.0000 [IU] | Freq: Once | INTRAVENOUS | Status: DC | PRN

## 2018-06-11 MED ORDER — HEPARIN SOD (PORK) LOCK FLUSH 100 UNIT/ML IV SOLN
500.0000 [IU] | Freq: Once | INTRAVENOUS | Status: AC
  Administered 2018-06-11: 500 [IU] via INTRAVENOUS
  Filled 2018-06-11: qty 5

## 2018-06-11 MED ORDER — SODIUM CHLORIDE 0.9 % IV SOLN
Freq: Once | INTRAVENOUS | Status: AC
  Administered 2018-06-11: 10:00:00 via INTRAVENOUS
  Filled 2018-06-11: qty 1000

## 2018-06-11 MED ORDER — SODIUM CHLORIDE 0.9% FLUSH
10.0000 mL | INTRAVENOUS | Status: DC | PRN
  Filled 2018-06-11: qty 10

## 2018-06-11 MED ORDER — SODIUM CHLORIDE 0.9% FLUSH
10.0000 mL | INTRAVENOUS | Status: DC | PRN
  Administered 2018-06-11: 10 mL via INTRAVENOUS
  Filled 2018-06-11: qty 10

## 2018-06-11 MED ORDER — DEXAMETHASONE SODIUM PHOSPHATE 10 MG/ML IJ SOLN
10.0000 mg | Freq: Once | INTRAMUSCULAR | Status: AC
  Administered 2018-06-11: 10 mg via INTRAVENOUS
  Filled 2018-06-11: qty 1

## 2018-06-13 DIAGNOSIS — E039 Hypothyroidism, unspecified: Secondary | ICD-10-CM | POA: Diagnosis not present

## 2018-06-13 DIAGNOSIS — Z794 Long term (current) use of insulin: Secondary | ICD-10-CM | POA: Diagnosis not present

## 2018-06-13 DIAGNOSIS — E1165 Type 2 diabetes mellitus with hyperglycemia: Secondary | ICD-10-CM | POA: Diagnosis not present

## 2018-06-19 ENCOUNTER — Ambulatory Visit
Admission: RE | Admit: 2018-06-19 | Discharge: 2018-06-19 | Disposition: A | Discharge status: Home / Self Care | Payer: PPO | Source: Ambulatory Visit | Attending: Oncology | Admitting: Oncology

## 2018-06-19 DIAGNOSIS — I313 Pericardial effusion (noninflammatory): Secondary | ICD-10-CM | POA: Diagnosis not present

## 2018-06-19 DIAGNOSIS — J439 Emphysema, unspecified: Secondary | ICD-10-CM | POA: Insufficient documentation

## 2018-06-19 DIAGNOSIS — J90 Pleural effusion, not elsewhere classified: Secondary | ICD-10-CM | POA: Insufficient documentation

## 2018-06-19 DIAGNOSIS — Z794 Long term (current) use of insulin: Secondary | ICD-10-CM | POA: Insufficient documentation

## 2018-06-19 DIAGNOSIS — I700 Atherosclerosis of aorta: Secondary | ICD-10-CM | POA: Insufficient documentation

## 2018-06-19 DIAGNOSIS — I7789 Other specified disorders of arteries and arterioles: Secondary | ICD-10-CM | POA: Diagnosis not present

## 2018-06-19 DIAGNOSIS — I2510 Atherosclerotic heart disease of native coronary artery without angina pectoris: Secondary | ICD-10-CM | POA: Insufficient documentation

## 2018-06-19 DIAGNOSIS — Z79899 Other long term (current) drug therapy: Secondary | ICD-10-CM | POA: Insufficient documentation

## 2018-06-19 DIAGNOSIS — Z79890 Hormone replacement therapy (postmenopausal): Secondary | ICD-10-CM | POA: Insufficient documentation

## 2018-06-19 DIAGNOSIS — C3412 Malignant neoplasm of upper lobe, left bronchus or lung: Secondary | ICD-10-CM | POA: Diagnosis not present

## 2018-06-19 DIAGNOSIS — Z5111 Encounter for antineoplastic chemotherapy: Secondary | ICD-10-CM | POA: Diagnosis not present

## 2018-06-19 LAB — GLUCOSE, CAPILLARY: Glucose-Capillary: 135 mg/dL — ABNORMAL HIGH (ref 70–99)

## 2018-06-19 MED ORDER — FLUDEOXYGLUCOSE F - 18 (FDG) INJECTION
13.3000 | Freq: Once | INTRAVENOUS | Status: AC | PRN
  Administered 2018-06-19: 13.24 via INTRAVENOUS

## 2018-06-26 ENCOUNTER — Other Ambulatory Visit: Payer: Self-pay | Admitting: *Deleted

## 2018-06-26 MED ORDER — OXYCODONE HCL 5 MG PO TABS: 5.0000 mg | ORAL_TABLET | Freq: Four times a day (QID) | ORAL | 0 refills | Status: DC | PRN

## 2018-06-26 NOTE — Telephone Encounter (Signed)
Patient called requesting a refill of her Oxycodone and also would like to get the results of her recent PET Scan. [phone].      d[facility]

## 2018-06-30 MED ORDER — LIDOCAINE HCL (PF) 2 % IJ SOLN
INTRAMUSCULAR | Status: AC
  Filled 2018-06-30: qty 10

## 2018-06-30 MED ORDER — ONDANSETRON HCL 4 MG/2ML IJ SOLN
INTRAMUSCULAR | Status: AC
  Filled 2018-06-30: qty 2

## 2018-06-30 MED ORDER — HYDROMORPHONE HCL 1 MG/ML IJ SOLN
INTRAMUSCULAR | Status: AC
  Filled 2018-06-30: qty 1

## 2018-06-30 MED ORDER — SUCCINYLCHOLINE CHLORIDE 20 MG/ML IJ SOLN
INTRAMUSCULAR | Status: AC
  Filled 2018-06-30: qty 1

## 2018-06-30 MED ORDER — FENTANYL CITRATE (PF) 100 MCG/2ML IJ SOLN
INTRAMUSCULAR | Status: AC
  Filled 2018-06-30: qty 2

## 2018-06-30 MED ORDER — PROPOFOL 10 MG/ML IV BOLUS
INTRAVENOUS | Status: AC
  Filled 2018-06-30: qty 20

## 2018-06-30 MED ORDER — ROCURONIUM BROMIDE 100 MG/10ML IV SOLN
INTRAVENOUS | Status: AC
  Filled 2018-06-30: qty 1

## 2018-06-30 MED ORDER — ACETAMINOPHEN 10 MG/ML IV SOLN
INTRAVENOUS | Status: AC
  Filled 2018-06-30: qty 100

## 2018-06-30 MED ORDER — DEXAMETHASONE SODIUM PHOSPHATE 10 MG/ML IJ SOLN
INTRAMUSCULAR | Status: AC
  Filled 2018-06-30: qty 1

## 2018-07-01 MED ORDER — SUGAMMADEX SODIUM 200 MG/2ML IV SOLN
INTRAVENOUS | Status: AC
  Filled 2018-07-01: qty 2

## 2018-07-02 ENCOUNTER — Other Ambulatory Visit: Payer: Self-pay

## 2018-07-02 ENCOUNTER — Inpatient Hospital Stay: Payer: PPO

## 2018-07-02 ENCOUNTER — Inpatient Hospital Stay (HOSPITAL_BASED_OUTPATIENT_CLINIC_OR_DEPARTMENT_OTHER): Payer: PPO | Admitting: Oncology

## 2018-07-02 DIAGNOSIS — D649 Anemia, unspecified: Secondary | ICD-10-CM

## 2018-07-02 DIAGNOSIS — E039 Hypothyroidism, unspecified: Secondary | ICD-10-CM | POA: Diagnosis not present

## 2018-07-02 DIAGNOSIS — C3412 Malignant neoplasm of upper lobe, left bronchus or lung: Secondary | ICD-10-CM | POA: Diagnosis not present

## 2018-07-02 DIAGNOSIS — D696 Thrombocytopenia, unspecified: Secondary | ICD-10-CM | POA: Diagnosis not present

## 2018-07-02 DIAGNOSIS — E1165 Type 2 diabetes mellitus with hyperglycemia: Secondary | ICD-10-CM

## 2018-07-02 DIAGNOSIS — T451X5A Adverse effect of antineoplastic and immunosuppressive drugs, initial encounter: Principal | ICD-10-CM

## 2018-07-02 DIAGNOSIS — I6523 Occlusion and stenosis of bilateral carotid arteries: Secondary | ICD-10-CM

## 2018-07-02 DIAGNOSIS — G620 Drug-induced polyneuropathy: Secondary | ICD-10-CM

## 2018-07-02 DIAGNOSIS — Z5111 Encounter for antineoplastic chemotherapy: Secondary | ICD-10-CM | POA: Diagnosis not present

## 2018-07-02 LAB — COMPREHENSIVE METABOLIC PANEL
ALT: 9 U/L (ref 0–44)
ANION GAP: 8 (ref 5–15)
AST: 15 U/L (ref 15–41)
Albumin: 3.4 g/dL — ABNORMAL LOW (ref 3.5–5.0)
Alkaline Phosphatase: 66 U/L (ref 38–126)
BUN: 12 mg/dL (ref 8–23)
CHLORIDE: 106 mmol/L (ref 98–111)
CO2: 25 mmol/L (ref 22–32)
CREATININE: 0.75 mg/dL (ref 0.44–1.00)
Calcium: 8.8 mg/dL — ABNORMAL LOW (ref 8.9–10.3)
Glucose, Bld: 156 mg/dL — ABNORMAL HIGH (ref 70–99)
POTASSIUM: 4 mmol/L (ref 3.5–5.1)
SODIUM: 139 mmol/L (ref 135–145)
Total Bilirubin: 0.7 mg/dL (ref 0.3–1.2)
Total Protein: 6.8 g/dL (ref 6.5–8.1)

## 2018-07-02 LAB — CBC WITH DIFFERENTIAL/PLATELET
Basophils Absolute: 0 10*3/uL (ref 0–0.1)
Basophils Relative: 1 %
EOS ABS: 0 10*3/uL (ref 0–0.7)
Eosinophils Relative: 0 %
HCT: 25.1 % — ABNORMAL LOW (ref 35.0–47.0)
HEMOGLOBIN: 8.3 g/dL — AB (ref 12.0–16.0)
LYMPHS ABS: 0.5 10*3/uL — AB (ref 1.0–3.6)
Lymphocytes Relative: 16 %
MCH: 30.2 pg (ref 26.0–34.0)
MCHC: 33.1 g/dL (ref 32.0–36.0)
MCV: 91.3 fL (ref 80.0–100.0)
MONOS PCT: 9 %
Monocytes Absolute: 0.3 10*3/uL (ref 0.2–0.9)
NEUTROS ABS: 2.4 10*3/uL (ref 1.4–6.5)
NEUTROS PCT: 74 %
Platelets: 119 10*3/uL — ABNORMAL LOW (ref 150–440)
RBC: 2.75 MIL/uL — ABNORMAL LOW (ref 3.80–5.20)
RDW: 18.7 % — ABNORMAL HIGH (ref 11.5–14.5)
WBC: 3.3 10*3/uL — AB (ref 3.6–11.0)

## 2018-07-02 LAB — SAMPLE TO BLOOD BANK

## 2018-07-02 MED ORDER — GABAPENTIN 300 MG PO CAPS: 300.0000 mg | ORAL_CAPSULE | Freq: Every day | ORAL | 2 refills | Status: AC

## 2018-07-02 MED ORDER — SODIUM CHLORIDE 0.9 % IV SOLN
Freq: Once | INTRAVENOUS | Status: AC
  Administered 2018-07-02: 11:00:00 via INTRAVENOUS
  Filled 2018-07-02: qty 1000

## 2018-07-02 MED ORDER — SODIUM CHLORIDE 0.9 % IV SOLN
67.0000 mg/m2 | Freq: Once | INTRAVENOUS | Status: AC
  Administered 2018-07-02: 150 mg via INTRAVENOUS
  Filled 2018-07-02: qty 15

## 2018-07-02 MED ORDER — SODIUM CHLORIDE 0.9 % IV SOLN: 10.0000 mg | Freq: Once | INTRAVENOUS | Status: DC

## 2018-07-02 MED ORDER — DEXAMETHASONE SODIUM PHOSPHATE 10 MG/ML IJ SOLN
10.0000 mg | Freq: Once | INTRAMUSCULAR | Status: AC
  Administered 2018-07-02: 10 mg via INTRAVENOUS
  Filled 2018-07-02: qty 1

## 2018-07-02 MED ORDER — HEPARIN SOD (PORK) LOCK FLUSH 100 UNIT/ML IV SOLN
500.0000 [IU] | Freq: Once | INTRAVENOUS | Status: AC | PRN
  Administered 2018-07-02: 500 [IU]

## 2018-07-02 MED ORDER — HEPARIN SOD (PORK) LOCK FLUSH 100 UNIT/ML IV SOLN
INTRAVENOUS | Status: AC
  Filled 2018-07-02: qty 5

## 2018-07-02 NOTE — Progress Notes (Signed)
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  INTERVAL HISTORY: Patient returns to clinic for further evaluation and consideration of cycle 6 of dose reduced Taxotere and PET scan results.  She has tolerated dose reduction much better.  She continues to complain of mild peripheral neuropathy that does not affect her day-to-day activities.  She continues to have occasional dizziness that is chronic and unchanged.  She denies any other neurological complaints.  She denies any recent fevers, illnesses, chest pain, cough, hemoptysis or shortness of breath.  She denies nausea, constipation, diarrhea or vomiting.  She denies urinary complaints.  REVIEW OF SYSTEMS:   Review of Systems  Constitutional: Negative.  Negative for chills, fever, malaise/fatigue and weight loss.  HENT: Negative for congestion and ear pain.   Eyes: Negative.  Negative for blurred vision and double vision.  Respiratory: Negative.  Negative for cough, sputum production and shortness of breath.   Cardiovascular: Negative.  Negative for chest pain, palpitations and leg swelling.  Gastrointestinal: Negative.  Negative for abdominal pain, constipation, diarrhea, nausea and vomiting.  Genitourinary: Negative for dysuria, frequency and urgency.  Musculoskeletal: Negative for back pain and falls.  Skin: Negative.  Negative for rash.  Neurological: Positive for dizziness (Chronic), sensory change (Stable) and weakness. Negative for headaches.    Endo/Heme/Allergies: Negative.  Does not bruise/bleed easily.  Psychiatric/Behavioral: Negative.  Negative for depression. The patient is not nervous/anxious and does not have insomnia.     As per HPI. Otherwise, a complete review of systems is negative.  PAST MEDICAL HISTORY: Past Medical History:  Diagnosis Date  . Arthritis   . Blood dyscrasia   . Cancer of left lung ([place]) 08/2015   Rad + chemo tx's.  . Diabetes mellitus without complication ([place])   . Diabetic neuropathy ([place])   . High cholesterol   . Hypertension   . Insulin dependent diabetes mellitus ([place])     PAST SURGICAL HISTORY: Past Surgical History:  Procedure Laterality Date  . ABDOMINAL HYSTERECTOMY    . CESAREAN SECTION    . ELECTROMAGNETIC NAVIGATION BROCHOSCOPY N/A 11/26/2015   Procedure: ELECTROMAGNETIC NAVIGATION BRONCHOSCOPY;  Surgeon: Flora Lipps, MD;  Location: [facility];  Service: Cardiopulmonary;  Laterality: N/A;  . ENDOBRONCHIAL ULTRASOUND N/A 11/26/2015   Procedure: ENDOBRONCHIAL ULTRASOUND;  Surgeon: Flora Lipps, MD;  Location: [facility];  Service: Cardiopulmonary;  Laterality: N/A;  . PERIPHERAL VASCULAR CATHETERIZATION N/A 12/09/2015   Procedure: Glori Luis Cath Insertion;  Surgeon: Katha Cabal, MD;  Location: [place] [facility];  Service: Cardiovascular;  Laterality: N/A;    FAMILY HISTORY: Reviewed and unchanged. No reported history of malignancy or chronic disease.     ADVANCED DIRECTIVES:    HEALTH MAINTENANCE: Social History   Tobacco Use  . Smoking status: Current Every Day Smoker    Packs/day: 0.30    Years: 35.00    Pack years: 10.50    Types: Cigarettes  . Smokeless tobacco: Never Used  . Tobacco comment: workiing on quitting, down to 2 packs per week now  Substance Use Topics  . Alcohol use: No  Alcohol/week: 0.0 oz  . Drug use: No     No Known Allergies  Current Outpatient Medications  Medication Sig Dispense Refill  . albuterol (PROVENTIL HFA;VENTOLIN HFA) 108  (90 Base) MCG/ACT inhaler Inhale 2 puffs into the lungs every 6 (six) hours as needed for wheezing or shortness of breath. 1 Inhaler 2  . atorvastatin (LIPITOR) 20 MG tablet Take 20 mg by mouth daily at 6 PM.    . gabapentin (NEURONTIN) 300 MG capsule Take 1 capsule (300 mg total) by mouth at bedtime. 30 capsule 2  . insulin glargine (LANTUS) 100 UNIT/ML injection Inject 0.52 mLs (52 Units total) into the skin daily. 10 mL 11  . levothyroxine (SYNTHROID, LEVOTHROID) 150 MCG tablet Take 200 mcg by mouth daily before breakfast.     . lidocaine-prilocaine (EMLA) cream Apply to affected area once 30 g 6  . lisinopril (PRINIVIL,ZESTRIL) 10 MG tablet Take 10 mg by mouth daily.    . magic mouthwash w/lidocaine SOLN Take 5 mLs by mouth 4 (four) times daily. 240 mL 1  . magnesium oxide (MAG-OX) 400 (241.3 Mg) MG tablet Take 1 tablet (400 mg total) by mouth daily. 14 tablet 0  . nystatin (MYCOSTATIN/NYSTOP) powder Apply topically 4 (four) times daily. 15 g 0  . oxyCODONE (OXY IR/ROXICODONE) 5 MG immediate release tablet Take 1 tablet (5 mg total) by mouth every 6 (six) hours as needed for severe pain. 60 tablet 0  . polyethylene glycol (MIRALAX / GLYCOLAX) packet Take 17 g by mouth daily as needed for mild constipation. 30 each 0   No current facility-administered medications for this visit.    Facility-Administered Medications Ordered in Other Visits  Medication Dose Route Frequency Provider Last Rate Last Dose  . heparin lock flush 100 unit/mL  500 Units Intravenous Once [name], MD      . sodium chloride flush (NS) 0.9 % injection 10 mL  10 mL Intravenous Once [name], MD        OBJECTIVE: There were no vitals filed for this visit.   There is no height or weight on file to calculate BMI.    ECOG FS:1 - Symptomatic but completely ambulatory  Physical Exam  Constitutional: She is oriented to person, place, and time. Vital signs are normal. She appears well-developed and  well-nourished.  HENT:  Head: Normocephalic and atraumatic.  Eyes: Pupils are equal, round, and reactive to light.  Neck: Normal range of motion.  Cardiovascular: Normal rate, regular rhythm and normal heart sounds.  No murmur heard. Pulmonary/Chest: Effort normal and breath sounds normal. She has no wheezes.  Abdominal: Soft. Normal appearance and bowel sounds are normal. She exhibits no distension. There is no tenderness.  Musculoskeletal: Normal range of motion. She exhibits no edema.  Neurological: She is alert and oriented to person, place, and time.  Skin: Skin is warm and dry. No rash noted.  Psychiatric: Judgment normal.    LAB RESULTS:  Lab Results  Component Value Date   NA 140 06/11/2018   K 4.1 06/11/2018   CL 109 06/11/2018   CO2 23 06/11/2018   GLUCOSE 176 (H) 06/11/2018   BUN 11 06/11/2018   CREATININE 1.01 (H) 06/11/2018   CALCIUM 8.5 (L) 06/11/2018   PROT 6.4 (L) 06/11/2018   ALBUMIN 3.3 (L) 06/11/2018   AST 21 06/11/2018   ALT 12 06/11/2018   ALKPHOS 63 06/11/2018   BILITOT 0.5 06/11/2018   GFRNONAA 56 (L) 06/11/2018   GFRAA >60 06/11/2018  Lab Results  Component Value Date   WBC 4.5 06/11/2018   NEUTROABS 3.5 06/11/2018   HGB 8.6 (L) 06/11/2018   HCT 25.5 (L) 06/11/2018   MCV 92.0 06/11/2018   PLT 121 (L) 06/11/2018   Lab Results  Component Value Date   IRON 66 02/26/2018   TIBC 289 02/26/2018   IRONPCTSAT 23 02/26/2018    Lab Results  Component Value Date   FERRITIN 178 02/26/2018     STUDIES: Nm Pet Image Restag (ps) Skull Base To Thigh  Result Date: 06/19/2018 CLINICAL DATA:  Subsequent treatment strategy for left upper lobe lung cancer. Restaging. On chemotherapy. EXAM: NUCLEAR MEDICINE PET SKULL BASE TO THIGH TECHNIQUE: 13.2 mCi F-18 FDG was injected intravenously. Full-ring PET imaging was performed from the skull base to thigh after the radiotracer. CT data was obtained and used for attenuation correction and anatomic  localization. Fasting blood glucose: 135 mg/dl COMPARISON:  03/29/2018 FINDINGS: Mild degradation secondary to patient body habitus. Mediastinal blood pool activity: SUV max 3.2 NECK: No areas of abnormal hypermetabolism. Incidental CT findings: No cervical adenopathy. Bilateral carotid atherosclerosis. CHEST: Right mediastinal node measures 2.9 cm and a S.U.V. max of 7.7. Compare 2.7 cm and a S.U.V. max of 12.8 on the prior. Subcarinal nodal mass measures 2.8 cm and a S.U.V. max of 6.6 today versus similar in size and a S.U.V. max of 10.5 on the prior. The central left upper lobe lung hypermetabolism, which is surrounded by post postobstructive and/or radiation induced consolidation measures a S.U.V. max of 4.6 today versus a S.U.V. max of 6.0 on the prior. Small left pleural effusion is minimally enlarged, with persistent low-level hypermetabolism, nonspecific. Incidental CT findings: Right Port-A-Cath terminates at the low SVC. Moderate cardiomegaly with development of a small pericardial effusion. Lad and branch coronary artery atherosclerosis. Pulmonary artery enlargement, outflow tract 3.5 cm. Extensive emphysema and diffuse interstitial thickening. ABDOMEN/PELVIS: No abdominopelvic parenchymal or nodal hypermetabolism. Incidental CT findings: Normal adrenal glands. Abdominal aortic atherosclerosis. Hysterectomy. SKELETON: No abnormal marrow activity. Incidental CT findings: Dorsal spinal stimulator. IMPRESSION: 1. Mild response to therapy, as evidenced by decreased hypermetabolism within mediastinal nodes and the left upper lobe primary. 2. No new sites of disease identified. 3. Increase in tiny left pleural effusion with development of small pericardial effusion. Question fluid overload. 4. Aortic atherosclerosis (ICD10-I70.0), coronary artery atherosclerosis and emphysema (ICD10-J43.9). Pulmonary artery enlargement suggests pulmonary arterial hypertension. 5. Mild degradation secondary to patient body  habitus. Electronically Signed   By: Abigail Miyamoto M.D.   On: 06/19/2018 15:05    Oncology treatment history:  Patient completed her initial treatment with concurrent chemotherapy and XRT on March 07, 2016. She then underwent 2 cycles of consolidation carboplatinum and Taxol completing on Apr 27, 2016.  CT scan results from Apr 18, 2017 revealed progression of disease. She subsequently received additional XRT and proceeded with nivolumab every 2 weeks.  She completed a total of 13 infusions of nivolumab on November 06, 2017.  PET scan on November 30, 2017 revealed progression of disease.  Patient started carboplatinum and gemcitabine on December 11, 2017.  Single agent Taxotere was initiated on April 02, 2017.  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  No new sites of disease were identified.  Proceed with cycle 5 of dose reduced Taxotere today. RTC in 3 weeks for further evaluation and consideration of cycle 6. 2.  Headaches/dizziness: This is chronic and unchanged.  Patient has known vascular Chiari malformation.  Previous MRI of the brain did not reveal metastatic disease.  Neurosurgery recommended intervention for her vascular malformation but given her progressive disease this is been placed on hold.  Patient has repeat MRI with neurosurgery in the future. 3.  Anemia: Patient's hemoglobin has trended down to 8.3 today.  She does not require a blood transfusion at this time.  Have requested that she return to clinic in 1 week for repeat labs and possible blood transfusion. 4.  Thrombocytopenia: Mild proceed with treatment.  Platelet count 119. 5.  Hyperglycemia: Blood sugar is 156 today.  Continue to monitor.  Continue  diabetic medications as prescribed. 6.  Pain: She has not complained of this today. 7.  Peripheral neuropathy: Stable.  Continue gabapentin as prescribed.  Refilled gabapentin today.  Greater than 50% was spent in counseling and coordination of care with this patient including but not limited to discussion of the relevant topics above (See A&P) including, but not limited to diagnosis and management of acute and chronic medical conditions.    Jacquelin Hawking, NP   07/02/2018 8:54 AM

## 2018-07-02 NOTE — Progress Notes (Signed)
Here for follow up. Stated tires quickly. Intermittent dizzy episodes she stated.  Stated she had pet scan and called for results- no call back. Hoping to have results today.

## 2018-07-09 ENCOUNTER — Inpatient Hospital Stay: Payer: PPO | Attending: Oncology

## 2018-07-09 ENCOUNTER — Inpatient Hospital Stay: Payer: PPO

## 2018-07-09 ENCOUNTER — Other Ambulatory Visit: Payer: Self-pay | Admitting: Oncology

## 2018-07-09 ENCOUNTER — Inpatient Hospital Stay (HOSPITAL_BASED_OUTPATIENT_CLINIC_OR_DEPARTMENT_OTHER): Payer: PPO | Admitting: Oncology

## 2018-07-09 DIAGNOSIS — Z5111 Encounter for antineoplastic chemotherapy: Secondary | ICD-10-CM | POA: Insufficient documentation

## 2018-07-09 DIAGNOSIS — E039 Hypothyroidism, unspecified: Secondary | ICD-10-CM | POA: Insufficient documentation

## 2018-07-09 DIAGNOSIS — E1165 Type 2 diabetes mellitus with hyperglycemia: Secondary | ICD-10-CM | POA: Diagnosis not present

## 2018-07-09 DIAGNOSIS — D701 Agranulocytosis secondary to cancer chemotherapy: Secondary | ICD-10-CM | POA: Diagnosis not present

## 2018-07-09 DIAGNOSIS — D649 Anemia, unspecified: Secondary | ICD-10-CM | POA: Insufficient documentation

## 2018-07-09 DIAGNOSIS — C3412 Malignant neoplasm of upper lobe, left bronchus or lung: Secondary | ICD-10-CM | POA: Diagnosis not present

## 2018-07-09 DIAGNOSIS — Z79899 Other long term (current) drug therapy: Secondary | ICD-10-CM | POA: Diagnosis not present

## 2018-07-09 DIAGNOSIS — Z794 Long term (current) use of insulin: Secondary | ICD-10-CM | POA: Insufficient documentation

## 2018-07-09 DIAGNOSIS — G620 Drug-induced polyneuropathy: Secondary | ICD-10-CM | POA: Diagnosis not present

## 2018-07-09 DIAGNOSIS — Q282 Arteriovenous malformation of cerebral vessels: Secondary | ICD-10-CM | POA: Insufficient documentation

## 2018-07-09 LAB — CBC WITH DIFFERENTIAL/PLATELET
Basophils Absolute: 0 10*3/uL (ref 0–0.1)
Basophils Relative: 1 %
EOS ABS: 0 10*3/uL (ref 0–0.7)
EOS PCT: 1 %
HCT: 23.6 % — ABNORMAL LOW (ref 35.0–47.0)
Hemoglobin: 7.9 g/dL — ABNORMAL LOW (ref 12.0–16.0)
LYMPHS ABS: 0.3 10*3/uL — AB (ref 1.0–3.6)
Lymphocytes Relative: 65 %
MCH: 30.2 pg (ref 26.0–34.0)
MCHC: 33.4 g/dL (ref 32.0–36.0)
MCV: 90.4 fL (ref 80.0–100.0)
Monocytes Absolute: 0.1 10*3/uL — ABNORMAL LOW (ref 0.2–0.9)
Monocytes Relative: 18 %
Neutro Abs: 0.1 10*3/uL — ABNORMAL LOW (ref 1.4–6.5)
Neutrophils Relative %: 15 %
PLATELETS: 107 10*3/uL — AB (ref 150–440)
RBC: 2.61 MIL/uL — AB (ref 3.80–5.20)
RDW: 17.9 % — AB (ref 11.5–14.5)
WBC: 0.5 10*3/uL — AB (ref 3.6–11.0)

## 2018-07-09 LAB — COMPREHENSIVE METABOLIC PANEL
ALT: 9 U/L (ref 0–44)
AST: 15 U/L (ref 15–41)
Albumin: 3.5 g/dL (ref 3.5–5.0)
Alkaline Phosphatase: 62 U/L (ref 38–126)
Anion gap: 8 (ref 5–15)
BUN: 14 mg/dL (ref 8–23)
CHLORIDE: 105 mmol/L (ref 98–111)
CO2: 25 mmol/L (ref 22–32)
CREATININE: 0.75 mg/dL (ref 0.44–1.00)
Calcium: 8.8 mg/dL — ABNORMAL LOW (ref 8.9–10.3)
GFR calc Af Amer: >60 mL/min (ref >60)
Glucose, Bld: 155 mg/dL — ABNORMAL HIGH (ref 70–99)
Potassium: 4 mmol/L (ref 3.5–5.1)
Sodium: 138 mmol/L (ref 135–145)
Total Bilirubin: 1 mg/dL (ref 0.3–1.2)
Total Protein: 6.8 g/dL (ref 6.5–8.1)

## 2018-07-09 LAB — SAMPLE TO BLOOD BANK

## 2018-07-09 LAB — PREPARE RBC (CROSSMATCH)

## 2018-07-09 MED ORDER — SODIUM CHLORIDE 0.9% IV SOLUTION
250.0000 mL | Freq: Once | INTRAVENOUS | Status: AC
  Administered 2018-07-09: 250 mL via INTRAVENOUS
  Filled 2018-07-09: qty 250

## 2018-07-09 MED ORDER — HEPARIN SOD (PORK) LOCK FLUSH 100 UNIT/ML IV SOLN
500.0000 [IU] | Freq: Once | INTRAVENOUS | Status: AC
  Administered 2018-07-09: 500 [IU] via INTRAVENOUS
  Filled 2018-07-09: qty 5

## 2018-07-09 MED ORDER — ACETAMINOPHEN 325 MG PO TABS
650.0000 mg | ORAL_TABLET | Freq: Four times a day (QID) | ORAL | Status: DC | PRN
Start: 2018-07-09 — End: 2018-07-09

## 2018-07-09 MED ORDER — ACETAMINOPHEN 325 MG PO TABS
650.0000 mg | ORAL_TABLET | Freq: Once | ORAL | Status: AC
  Administered 2018-07-09: 650 mg via ORAL
  Filled 2018-07-09: qty 2

## 2018-07-09 MED ORDER — DIPHENHYDRAMINE HCL 25 MG PO TABS
25.0000 mg | ORAL_TABLET | Freq: Once | ORAL | Status: DC
Start: 2018-07-09 — End: 2018-07-09
  Filled 2018-07-09: qty 1

## 2018-07-09 MED ORDER — DIPHENHYDRAMINE HCL 25 MG PO CAPS
25.0000 mg | ORAL_CAPSULE | Freq: Once | ORAL | Status: AC
  Administered 2018-07-09: 25 mg via ORAL
  Filled 2018-07-09: qty 1

## 2018-07-09 MED ORDER — SODIUM CHLORIDE 0.9% FLUSH
10.0000 mL | INTRAVENOUS | Status: DC | PRN
  Administered 2018-07-09: 10 mL via INTRAVENOUS
  Filled 2018-07-09: qty 10

## 2018-07-09 MED ORDER — SODIUM CHLORIDE 0.9% FLUSH
10.0000 mL | INTRAVENOUS | Status: DC | PRN
  Filled 2018-07-09: qty 10

## 2018-07-09 MED ORDER — HEPARIN SOD (PORK) LOCK FLUSH 100 UNIT/ML IV SOLN: 500.0000 [IU] | Freq: Every day | INTRAVENOUS | Status: DC | PRN

## 2018-07-09 NOTE — Progress Notes (Signed)
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  Chief complaint/ Reason for visit-weakness/fatigue  Heme/Onc history: Patient was last seen prior to cycle 6 of dose reduced Taxotere and to receive results from her recent PET scan.  At that visit she complained of mild peripheral neuropathy and numbness.  Continued to experience dizziness that remained unchanged.  PET scan revealed mild response to therapy and no new sites of disease were identified.  She was noted to be anemic with a hemoglobin of 8.3.  We proceeded with treatment and instructed her to return to clinic in 1 week with labs and possible blood transfusion.  Oncology History    Patient completed her initial treatment with concurrent chemotherapy and XRT on March 07, 2016. She then underwent 2 cycles of consolidation carboplatinum and Taxol completing on Apr 27, 2016.  CT scan results from Apr 18, 2017 revealed progression of disease. She subsequently received additional XRT and proceeded with nivolumab every 2 weeks.  She completed a total of 13 infusions of nivolumab on November 06, 2017.  PET scan on November 30, 2017 revealed progression of disease.  Patient started carboplatinum and gemcitabine on December 11, 2017.  Single agent Taxotere was initiated on April 02, 2017.       Malignant neoplasm of upper lobe of left lung ([place])   12/07/2015 Initial Diagnosis    Malignant neoplasm of upper lobe of left  lung ([place])      04/01/2018 -  Chemotherapy    The patient had pegfilgrastim-cbqv (UDENYCA) injection 6 mg, 6 mg, Subcutaneous, Once, 4 of 5 cycles DOCEtaxel (TAXOTERE) 170 mg in sodium chloride 0.9 % 250 mL chemo infusion, 75 mg/m2 = 170 mg, Intravenous,  Once, 5 of 6 cycles Dose modification: 67 mg/m2 (original dose 75 mg/m2, Cycle 2, Reason: Dose not tolerated) Administration: 170 mg (04/02/2018), 150 mg (05/02/2018), 150 mg (05/21/2018), 150 mg (06/11/2018), 150 mg (07/02/2018)  for chemotherapy treatment.        Interval history-  Patient presents for presents evaluation of anemia.  She was seen last week by me prior to chemotherapy and noted to have a decreased hemoglobin d/t chemo.  She received cycle 5 of Taxotere and told to return to clinic in 1 week for labs to trend hemoglobin. Anemia was found by CBC scheduled after chemo.   It has been present for 1 week.   Associated signs & symptoms: dizziness/lightheadedness, dyspnea and fatigue.  ECOG FS:1 - Symptomatic but completely ambulatory  Review of systems- Review of Systems  Constitutional: Positive for malaise/fatigue. Negative for chills, fever and weight loss.  HENT: Negative for congestion and ear pain.   Eyes: Negative.  Negative for blurred vision and double vision.  Respiratory: Positive for cough and shortness of breath. Negative for sputum production.   Cardiovascular: Negative.  Negative for chest pain, palpitations and leg swelling.  Gastrointestinal: Negative.  Negative for abdominal pain, constipation, diarrhea, nausea and vomiting.  Genitourinary: Negative for dysuria, frequency and urgency.  Musculoskeletal: Negative for back  pain and falls.  Skin: Negative.  Negative for rash.  Neurological: Positive for weakness. Negative for headaches.  Endo/Heme/Allergies: Negative.  Does not bruise/bleed easily.  Psychiatric/Behavioral: Negative.  Negative for depression. The patient is not nervous/anxious and does not have  insomnia.      Current treatment- S/p cycle 5 Taxotere 07/02/18  No Known Allergies   Past Medical History:  Diagnosis Date  . Arthritis   . Blood dyscrasia   . Cancer of left lung ([place]) 08/2015   Rad + chemo tx's.  . Diabetes mellitus without complication ([place])   . Diabetic neuropathy ([place])   . High cholesterol   . Hypertension   . Insulin dependent diabetes mellitus ([place])      Past Surgical History:  Procedure Laterality Date  . ABDOMINAL HYSTERECTOMY    . CESAREAN SECTION    . ELECTROMAGNETIC NAVIGATION BROCHOSCOPY N/A 11/26/2015   Procedure: ELECTROMAGNETIC NAVIGATION BRONCHOSCOPY;  Surgeon: Flora Lipps, MD;  Location: [facility] ORS;  Service: Cardiopulmonary;  Laterality: N/A;  . ENDOBRONCHIAL ULTRASOUND N/A 11/26/2015   Procedure: ENDOBRONCHIAL ULTRASOUND;  Surgeon: Flora Lipps, MD;  Location: [facility] OR[facility];  Service: Cardiopulmonary;  Laterality: N/A;  . PERIPHERAL VASCULAR CATHETERIZATION N/A 12/09/2015   Procedure: Glori Luis Cath Insertion;  Surgeon: Katha Cabal, MD;  Location: [place] [facility];  Service: Cardiovascular;  Laterality: N/A;    Social History   Socioeconomic History  . Marital status: Widowed    Spouse name: Not on file  . Number of children: Not on file  . Years of education: Not on file  . Highest education level: Not on file  Occupational History  . Not on file  Social Needs  . Financial resource strain: Not on file  . Food insecurity:    Worry: Not on file    Inability: Not on file  . Transportation needs:    Medical: Not on file    Non-medical: Not on file  Tobacco Use  . Smoking status: Current Every Day Smoker    Packs/day: 0.30    Years: 35.00    Pack years: 10.50    Types: Cigarettes  . Smokeless tobacco: Never Used  . Tobacco comment: workiing on quitting, down to 2 packs per week now  Substance and Sexual Activity  . Alcohol use: No    Alcohol/week: 0.0 oz  . Drug use: No  . Sexual activity: Not Currently  Lifestyle  .  Physical activity:    Days per week: Not on file    Minutes per session: Not on file  . Stress: Not on file  Relationships  . Social connections:    Talks on phone: Not on file    Gets together: Not on file    Attends religious service: Not on file    Active member of club or organization: Not on file    Attends meetings of clubs or organizations: Not on file    Relationship status: Not on file  . Intimate partner violence:    Fear of current or ex partner: Not on file    Emotionally abused: Not on file    Physically abused: Not on file    Forced sexual activity: Not on file  Other Topics Concern  . Not on file  Social History Narrative  . Not on file    No family history on file.   Current Outpatient Medications:  .  albuterol (PROVENTIL HFA;VENTOLIN HFA) 108 (90 Base) MCG/ACT inhaler, Inhale 2 puffs into the lungs every 6 (six) hours  as needed for wheezing or shortness of breath. (Patient not taking: Reported on 07/02/2018), Disp: 1 Inhaler, Rfl: 2 .  atorvastatin (LIPITOR) 20 MG tablet, Take 20 mg by mouth daily at 6 PM., Disp: , Rfl:  .  gabapentin (NEURONTIN) 300 MG capsule, Take 1 capsule (300 mg total) by mouth at bedtime., Disp: 30 capsule, Rfl: 2 .  insulin glargine (LANTUS) 100 UNIT/ML injection, Inject 0.52 mLs (52 Units total) into the skin daily., Disp: 10 mL, Rfl: 11 .  levothyroxine (SYNTHROID, LEVOTHROID) 150 MCG tablet, Take 200 mcg by mouth daily before breakfast. , Disp: , Rfl:  .  lidocaine-prilocaine (EMLA) cream, Apply to affected area once, Disp: 30 g, Rfl: 6 .  linagliptin (TRADJENTA) 5 MG TABS tablet, Take by mouth., Disp: , Rfl:  .  lisinopril (PRINIVIL,ZESTRIL) 10 MG tablet, Take 10 mg by mouth daily., Disp: , Rfl:  .  magic mouthwash w/lidocaine SOLN, Take 5 mLs by mouth 4 (four) times daily. (Patient not taking: Reported on 07/02/2018), Disp: 240 mL, Rfl: 1 .  magnesium oxide (MAG-OX) 400 (241.3 Mg) MG tablet, Take 1 tablet (400 mg total) by mouth  daily., Disp: 14 tablet, Rfl: 0 .  nystatin (MYCOSTATIN/NYSTOP) powder, Apply topically 4 (four) times daily., Disp: 15 g, Rfl: 0 .  oxyCODONE (OXY IR/ROXICODONE) 5 MG immediate release tablet, Take 1 tablet (5 mg total) by mouth every 6 (six) hours as needed for severe pain., Disp: 60 tablet, Rfl: 0 .  polyethylene glycol (MIRALAX / GLYCOLAX) packet, Take 17 g by mouth daily as needed for mild constipation. (Patient not taking: Reported on 07/02/2018), Disp: 30 each, Rfl: 0 No current facility-administered medications for this visit.   Facility-Administered Medications Ordered in Other Visits:  .  heparin lock flush 100 unit/mL, 500 Units, Intravenous, Once, Finnegan, Kathlene November, MD .  sodium chloride flush (NS) 0.9 % injection 10 mL, 10 mL, Intravenous, Once, Grayland Ormond, Kathlene November, MD  Physical exam: There were no vitals filed for this visit. Physical Exam  Constitutional: She is oriented to person, place, and time. Vital signs are normal. She appears well-developed and well-nourished.  HENT:  Head: Normocephalic and atraumatic.  Eyes: Pupils are equal, round, and reactive to light.  Neck: Normal range of motion.  Cardiovascular: Normal rate, regular rhythm and normal heart sounds.  No murmur heard. Pulmonary/Chest: Effort normal and breath sounds normal. She has no wheezes.  Abdominal: Soft. Normal appearance and bowel sounds are normal. She exhibits no distension. There is no tenderness.  Musculoskeletal: Normal range of motion. She exhibits no edema.  Neurological: She is alert and oriented to person, place, and time.  Skin: Skin is warm and dry. No rash noted.  Psychiatric: Judgment normal.  Vitals reviewed.    CMP Latest Ref Rng & Units 07/09/2018  Glucose 70 - 99 mg/dL 155(H)  BUN 8 - 23 mg/dL 14  Creatinine 0.44 - 1.00 mg/dL 0.75  Sodium 135 - 145 mmol/L 138  Potassium 3.5 - 5.1 mmol/L 4.0  Chloride 98 - 111 mmol/L 105  CO2 22 - 32 mmol/L 25  Calcium 8.9 - 10.3 mg/dL 8.8(L)    Total Protein 6.5 - 8.1 g/dL 6.8  Total Bilirubin 0.3 - 1.2 mg/dL 1.0  Alkaline Phos 38 - 126 U/L 62  AST 15 - 41 U/L 15  ALT 0 - 44 U/L 9   CBC Latest Ref Rng & Units 07/09/2018  WBC 3.6 - 11.0 K/uL 0.5(LL)  Hemoglobin 12.0 - 16.0 g/dL 7.9(L)  Hematocrit 35.0 -  47.0 % 23.6(L)  Platelets 150 - 440 K/uL 107(L)    No images are attached to the encounter.  Nm Pet Image Restag (ps) Skull Base To Thigh  Result Date: 06/19/2018 CLINICAL DATA:  Subsequent treatment strategy for left upper lobe lung cancer. Restaging. On chemotherapy. EXAM: NUCLEAR MEDICINE PET SKULL BASE TO THIGH TECHNIQUE: 13.2 mCi F-18 FDG was injected intravenously. Full-ring PET imaging was performed from the skull base to thigh after the radiotracer. CT data was obtained and used for attenuation correction and anatomic localization. Fasting blood glucose: 135 mg/dl COMPARISON:  03/29/2018 FINDINGS: Mild degradation secondary to patient body habitus. Mediastinal blood pool activity: SUV max 3.2 NECK: No areas of abnormal hypermetabolism. Incidental CT findings: No cervical adenopathy. Bilateral carotid atherosclerosis. CHEST: Right mediastinal node measures 2.9 cm and a S.U.V. max of 7.7. Compare 2.7 cm and a S.U.V. max of 12.8 on the prior. Subcarinal nodal mass measures 2.8 cm and a S.U.V. max of 6.6 today versus similar in size and a S.U.V. max of 10.5 on the prior. The central left upper lobe lung hypermetabolism, which is surrounded by post postobstructive and/or radiation induced consolidation measures a S.U.V. max of 4.6 today versus a S.U.V. max of 6.0 on the prior. Small left pleural effusion is minimally enlarged, with persistent low-level hypermetabolism, nonspecific. Incidental CT findings: Right Port-A-Cath terminates at the low SVC. Moderate cardiomegaly with development of a small pericardial effusion. Lad and branch coronary artery atherosclerosis. Pulmonary artery enlargement, outflow tract 3.5 cm. Extensive emphysema  and diffuse interstitial thickening. ABDOMEN/PELVIS: No abdominopelvic parenchymal or nodal hypermetabolism. Incidental CT findings: Normal adrenal glands. Abdominal aortic atherosclerosis. Hysterectomy. SKELETON: No abnormal marrow activity. Incidental CT findings: Dorsal spinal stimulator. IMPRESSION: 1. Mild response to therapy, as evidenced by decreased hypermetabolism within mediastinal nodes and the left upper lobe primary. 2. No new sites of disease identified. 3. Increase in tiny left pleural effusion with development of small pericardial effusion. Question fluid overload. 4. Aortic atherosclerosis (ICD10-I70.0), coronary artery atherosclerosis and emphysema (ICD10-J43.9). Pulmonary artery enlargement suggests pulmonary arterial hypertension. 5. Mild degradation secondary to patient body habitus. Electronically Signed   By: Abigail Miyamoto M.D.   On: 06/19/2018 15:05     Assessment and plan- Patient is a 67 y.o. female who presents with anemia, fatigue and weakness d/t  recent chemotherapy.  1.  Stage IIIa squamous cell carcinoma of lung: S/p 5 cycles of dose reduced Taxotere.  Tolerating well.  Recent PET scan revealed positive response to therapy and no new sites of disease were identified.  She is scheduled to return to clinic on 07/23/2018 to see Dr. Grayland Ormond, labs and consideration of cycle 6 is reduced Taxotere.  Patient continues to experience progressive numbness to bilateral fingertips.  She denies pain but this may warrant further dose reduction in the future.  Let Dr. Grayland Ormond know.  2.  Grade 3 Anemia: Hemoglobin 7.9 today.  Patient is symptomatic with shortness of breath, weakness and fatigue.  She is scheduled to receive 1 unit packed red blood cells today.  Proceed with blood as scheduled.   3.  Grade 4 Neutropenia: ANC 0.1 today.  Patient may prophylactic growth factor support with next treatment.  Encouraged neutropenic precautions.  Provided patient with masks.  Patient has oral  therometer at home.  Encouraged her to stay away from known sick contacts.  If she develops a fever, she is to call clinic immediately.  Visit Diagnosis 1. Anemia, unspecified type   2. Malignant neoplasm of upper lobe  of left lung [facility])     Patient expressed understanding and was in agreement with this plan. She also understands that She can call clinic at any time with any questions, concerns, or complaints.   Greater than 50% was spent in counseling and coordination of care with this patient including but not limited to discussion of the relevant topics above (See A&P) including, but not limited to diagnosis and management of acute and chronic medical conditions.    Faythe Casa, AGNP-C [facility] [place]- [phone] Pager- [phone] 07/10/2018 1:16 PM

## 2018-07-10 LAB — BPAM RBC
BLOOD PRODUCT EXPIRATION DATE: 201908072359
ISSUE DATE / TIME: 201908051245
UNIT TYPE AND RH: 9500

## 2018-07-10 LAB — TYPE AND SCREEN
ABO/RH(D): O POS
Antibody Screen: NEGATIVE
UNIT DIVISION: 0

## 2018-07-22 NOTE — Progress Notes (Signed)
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  INTERVAL HISTORY: Patient returns to clinic today for further evaluation and consideration of cycle 6 of dose reduced her Taxotere.  She continues to have a peripheral neuropathy, but admits it is slightly improved since dose reduction several cycles ago.  She is also tolerating her treatments better with the dose reduction.  She does not complain of pain today. She continues to have occasional dizziness that is chronic and unchanged.  She has no other neurologic complaints. She denies any recent fevers or illnesses. She denies any chest pain, cough, hemoptysis, or shortness of breath. She denies nausea, constipation, diarrhea, or vomiting.  She has no urinary complaints.  Patient offers no further specific complaints today.   REVIEW OF SYSTEMS:   Review of Systems  Constitutional: Negative.  Negative for fever, malaise/fatigue and weight loss.  HENT: Negative.  Negative for congestion.   Eyes: Negative.  Negative for blurred vision.  Respiratory: Negative.  Negative for cough, hemoptysis and shortness of breath.   Cardiovascular: Negative.  Negative for chest pain and leg swelling.  Gastrointestinal: Negative.  Negative for abdominal pain and constipation.  Genitourinary: Negative.  Negative for flank pain.  Musculoskeletal: Negative.  Negative for joint pain and myalgias.  Skin: Negative.  Negative for rash.  Neurological: Positive for dizziness, tingling and sensory  change. Negative for focal weakness, weakness and headaches.  Psychiatric/Behavioral: Negative.  Negative for memory loss. The patient is not nervous/anxious.     As per HPI. Otherwise, a complete review of systems is negative.  PAST MEDICAL HISTORY: Past Medical History:  Diagnosis Date  . Arthritis   . Blood dyscrasia   . Cancer of left lung ([place]) 08/2015   Rad + chemo tx's.  . Diabetes mellitus without complication ([place])   . Diabetic neuropathy ([place])   . High cholesterol   . Hypertension   . Insulin dependent diabetes mellitus ([place])     PAST SURGICAL HISTORY: Past Surgical History:  Procedure Laterality Date  . ABDOMINAL HYSTERECTOMY    . CESAREAN SECTION    . ELECTROMAGNETIC NAVIGATION BROCHOSCOPY N/A 11/26/2015   Procedure: ELECTROMAGNETIC NAVIGATION BRONCHOSCOPY;  Surgeon: Flora Lipps, MD;  Location: [facility];  Service: Cardiopulmonary;  Laterality: N/A;  . ENDOBRONCHIAL ULTRASOUND N/A 11/26/2015   Procedure: ENDOBRONCHIAL ULTRASOUND;  Surgeon: Flora Lipps, MD;  Location: [facility];  Service: Cardiopulmonary;  Laterality: N/A;  . PERIPHERAL VASCULAR CATHETERIZATION N/A 12/09/2015   Procedure: Glori Luis Cath Insertion;  Surgeon: Katha Cabal, MD;  Location: [facility];  Service: Cardiovascular;  Laterality: N/A;    FAMILY HISTORY: Reviewed and unchanged. No reported history of malignancy or chronic disease.     ADVANCED DIRECTIVES:    HEALTH MAINTENANCE: Social History   Tobacco Use  . Smoking status: Current Every Day Smoker    Packs/day: 0.30    Years: 35.00    Pack years: 10.50    Types: Cigarettes  . Smokeless tobacco: Never Used  . Tobacco comment: workiing on quitting, down to 2 packs per week now  Substance Use Topics  .  Alcohol use: No    Alcohol/week: 0.0 standard drinks  . Drug use: No     No Known Allergies  Current Outpatient Medications  Medication Sig Dispense Refill  . albuterol (PROVENTIL HFA;VENTOLIN HFA) 108 (90 Base) MCG/ACT  inhaler Inhale 2 puffs into the lungs every 6 (six) hours as needed for wheezing or shortness of breath. (Patient not taking: Reported on 07/02/2018) 1 Inhaler 2  . atorvastatin (LIPITOR) 20 MG tablet Take 20 mg by mouth daily at 6 PM.    . gabapentin (NEURONTIN) 300 MG capsule Take 1 capsule (300 mg total) by mouth at bedtime. 30 capsule 2  . insulin glargine (LANTUS) 100 UNIT/ML injection Inject 0.52 mLs (52 Units total) into the skin daily. 10 mL 11  . levothyroxine (SYNTHROID, LEVOTHROID) 150 MCG tablet Take 200 mcg by mouth daily before breakfast.     . lidocaine-prilocaine (EMLA) cream Apply to affected area once 30 g 6  . linagliptin (TRADJENTA) 5 MG TABS tablet Take by mouth.    Marland Kitchen lisinopril (PRINIVIL,ZESTRIL) 10 MG tablet Take 10 mg by mouth daily.    . magic mouthwash w/lidocaine SOLN Take 5 mLs by mouth 4 (four) times daily. (Patient not taking: Reported on 07/02/2018) 240 mL 1  . magnesium oxide (MAG-OX) 400 (241.3 Mg) MG tablet Take 1 tablet (400 mg total) by mouth daily. 14 tablet 0  . nystatin (MYCOSTATIN/NYSTOP) powder Apply topically 4 (four) times daily. 15 g 0  . oxyCODONE (OXY IR/ROXICODONE) 5 MG immediate release tablet Take 1 tablet (5 mg total) by mouth every 6 (six) hours as needed for severe pain. 60 tablet 0  . polyethylene glycol (MIRALAX / GLYCOLAX) packet Take 17 g by mouth daily as needed for mild constipation. (Patient not taking: Reported on 07/02/2018) 30 each 0   No current facility-administered medications for this visit.    Facility-Administered Medications Ordered in Other Visits  Medication Dose Route Frequency Provider Last Rate Last Dose  . heparin lock flush 100 unit/mL  500 Units Intravenous Once [name], MD      . sodium chloride flush (NS) 0.9 % injection 10 mL  10 mL Intravenous Once [name], MD        OBJECTIVE: There were no vitals filed for this visit.   There is no height or weight on file to calculate BMI.    ECOG FS:1 -  Symptomatic but completely ambulatory  General: Well-developed, well-nourished, no acute distress. Eyes: Pink conjunctiva, anicteric sclera. HEENT: Normocephalic, moist mucous membranes. Lungs: Clear to auscultation bilaterally. Heart: Regular rate and rhythm. No rubs, murmurs, or gallops. Abdomen: Soft, nontender, nondistended. No organomegaly noted, normoactive bowel sounds. Musculoskeletal: No edema, cyanosis, or clubbing. Neuro: Alert, answering all questions appropriately. Cranial nerves grossly intact. Skin: No rashes or petechiae noted. Psych: Normal affect.  LAB RESULTS:  Lab Results  Component Value Date   NA 142 07/23/2018   K 4.1 07/23/2018   CL 109 07/23/2018   CO2 25 07/23/2018   GLUCOSE 173 (H) 07/23/2018   BUN 14 07/23/2018   CREATININE 0.77 07/23/2018   CALCIUM 8.7 (L) 07/23/2018   PROT 6.5 07/23/2018   ALBUMIN 3.1 (L) 07/23/2018   AST 15 07/23/2018   ALT 8 07/23/2018   ALKPHOS 63 07/23/2018   BILITOT 0.5 07/23/2018   GFRNONAA >60 07/23/2018   GFRAA >60 07/23/2018    Lab Results  Component Value Date   WBC 5.0 07/23/2018   NEUTROABS 4.0 07/23/2018   HGB 9.0 (L) 07/23/2018  HCT 27.0 (L) 07/23/2018   MCV 89.7 07/23/2018   PLT 168 07/23/2018   Lab Results  Component Value Date   IRON 66 02/26/2018   TIBC 289 02/26/2018   IRONPCTSAT 23 02/26/2018    Lab Results  Component Value Date   FERRITIN 178 02/26/2018     STUDIES: No results found.  Oncology treatment history:  Patient completed her initial treatment with concurrent chemotherapy and XRT on March 07, 2016. She then underwent 2 cycles of consolidation carboplatinum and Taxol completing on Apr 27, 2016.  CT scan results from Apr 18, 2017 revealed progression of disease. She subsequently received additional XRT and proceeded with nivolumab every 2 weeks.  She completed a total of 13 infusions of nivolumab on November 06, 2017.  PET scan on November 30, 2017 revealed progression of disease.   Patient started carboplatinum and gemcitabine on December 11, 2017.  Single agent Taxotere was initiated on April 02, 2017.  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  PET scan results from June 19, 2018 reviewed independently revealing mild improvement of disease burden.  Because of patient's peripheral neuropathy, will dose reduce Taxotere again to 60 mg/m every 3 weeks.  Proceed with cycle 6 today. Unable to perform OmniSeq testing secondary to insufficient tissue.  Return to clinic in 3 weeks for further evaluation and consideration of cycle 7. 2. Headaches/dizziness: Chronic and unchanged.  Patient has known vascular malformation. MRI of the brain reviewed independently with no obvious metastatic disease. 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  Patient reports she has an MRI with neurosurgery in the near future.  Continue follow-up with neurosurgery as indicated. 3. Anemia: Patient's hemoglobin is decreased but stable at 9.0.  Continue to monitor closely. 4. Thrombocytopenia: Resolved. 5.  Neutropenia: Patient became significantly neutropenic with previous cycle.  Her white count is now within normal limits.  She does not require Udenyca support at this time, but will consider her cycles. 5. Hyperglycemia: Patient's blood glucose remains chronically elevated, but overall she has improved control.  Continue diabetic medications as prescribed.  Appreciate endocrinology input.   6. Pain: Chronic and well controlled.  Continue oxycodone as needed.  7.  Hypothyroidism: Continue Synthroid and follow-up with endocrinology as indicated.   Patient expressed understanding and was in agreement with this plan. She also understands that She can call clinic at  any time with any questions, concerns, or complaints.    [name], MD   07/23/2018 6:17 PM

## 2018-07-23 ENCOUNTER — Inpatient Hospital Stay: Payer: PPO

## 2018-07-23 ENCOUNTER — Inpatient Hospital Stay (HOSPITAL_BASED_OUTPATIENT_CLINIC_OR_DEPARTMENT_OTHER): Payer: PPO | Admitting: Oncology

## 2018-07-23 VITALS — BP 116/75 | HR 72 | Temp 97.0°F | Resp 18 | Wt 244.0 lb

## 2018-07-23 DIAGNOSIS — Z79899 Other long term (current) drug therapy: Secondary | ICD-10-CM

## 2018-07-23 DIAGNOSIS — E1165 Type 2 diabetes mellitus with hyperglycemia: Secondary | ICD-10-CM

## 2018-07-23 DIAGNOSIS — C3412 Malignant neoplasm of upper lobe, left bronchus or lung: Secondary | ICD-10-CM

## 2018-07-23 DIAGNOSIS — Q282 Arteriovenous malformation of cerebral vessels: Secondary | ICD-10-CM

## 2018-07-23 DIAGNOSIS — E039 Hypothyroidism, unspecified: Secondary | ICD-10-CM

## 2018-07-23 DIAGNOSIS — Z794 Long term (current) use of insulin: Secondary | ICD-10-CM

## 2018-07-23 DIAGNOSIS — Z5111 Encounter for antineoplastic chemotherapy: Secondary | ICD-10-CM | POA: Diagnosis not present

## 2018-07-23 DIAGNOSIS — G620 Drug-induced polyneuropathy: Secondary | ICD-10-CM | POA: Diagnosis not present

## 2018-07-23 DIAGNOSIS — D649 Anemia, unspecified: Secondary | ICD-10-CM | POA: Diagnosis not present

## 2018-07-23 LAB — CBC WITH DIFFERENTIAL/PLATELET
BASOS PCT: 0 %
Basophils Absolute: 0 10*3/uL (ref 0–0.1)
Eosinophils Absolute: 0 10*3/uL (ref 0–0.7)
Eosinophils Relative: 0 %
HEMATOCRIT: 27 % — AB (ref 35.0–47.0)
Hemoglobin: 9 g/dL — ABNORMAL LOW (ref 12.0–16.0)
Lymphocytes Relative: 12 %
Lymphs Abs: 0.6 10*3/uL — ABNORMAL LOW (ref 1.0–3.6)
MCH: 29.9 pg (ref 26.0–34.0)
MCHC: 33.3 g/dL (ref 32.0–36.0)
MCV: 89.7 fL (ref 80.0–100.0)
MONO ABS: 0.4 10*3/uL (ref 0.2–0.9)
MONOS PCT: 7 %
NEUTROS ABS: 4 10*3/uL (ref 1.4–6.5)
Neutrophils Relative %: 81 %
Platelets: 168 10*3/uL (ref 150–440)
RBC: 3.01 MIL/uL — ABNORMAL LOW (ref 3.80–5.20)
RDW: 17.9 % — AB (ref 11.5–14.5)
WBC: 5 10*3/uL (ref 3.6–11.0)

## 2018-07-23 LAB — COMPREHENSIVE METABOLIC PANEL
ALBUMIN: 3.1 g/dL — AB (ref 3.5–5.0)
ALK PHOS: 63 U/L (ref 38–126)
ALT: 8 U/L (ref 0–44)
ANION GAP: 8 (ref 5–15)
AST: 15 U/L (ref 15–41)
BUN: 14 mg/dL (ref 8–23)
CALCIUM: 8.7 mg/dL — AB (ref 8.9–10.3)
CO2: 25 mmol/L (ref 22–32)
CREATININE: 0.77 mg/dL (ref 0.44–1.00)
Chloride: 109 mmol/L (ref 98–111)
GFR calc Af Amer: >60 mL/min (ref >60)
GFR calc non Af Amer: >60 mL/min (ref >60)
GLUCOSE: 173 mg/dL — AB (ref 70–99)
Potassium: 4.1 mmol/L (ref 3.5–5.1)
SODIUM: 142 mmol/L (ref 135–145)
Total Bilirubin: 0.5 mg/dL (ref 0.3–1.2)
Total Protein: 6.5 g/dL (ref 6.5–8.1)

## 2018-07-23 LAB — SAMPLE TO BLOOD BANK

## 2018-07-23 MED ORDER — DEXAMETHASONE SODIUM PHOSPHATE 10 MG/ML IJ SOLN
10.0000 mg | Freq: Once | INTRAMUSCULAR | Status: AC
  Administered 2018-07-23: 10 mg via INTRAVENOUS
  Filled 2018-07-23: qty 1

## 2018-07-23 MED ORDER — HEPARIN SOD (PORK) LOCK FLUSH 100 UNIT/ML IV SOLN
500.0000 [IU] | Freq: Once | INTRAVENOUS | Status: AC
  Administered 2018-07-23: 500 [IU] via INTRAVENOUS

## 2018-07-23 MED ORDER — SODIUM CHLORIDE 0.9 % IV SOLN: 10.0000 mg | Freq: Once | INTRAVENOUS | Status: DC

## 2018-07-23 MED ORDER — SODIUM CHLORIDE 0.9 % IV SOLN
60.0000 mg/m2 | Freq: Once | INTRAVENOUS | Status: AC
  Administered 2018-07-23: 140 mg via INTRAVENOUS
  Filled 2018-07-23: qty 14

## 2018-07-23 MED ORDER — SODIUM CHLORIDE 0.9 % IV SOLN
Freq: Once | INTRAVENOUS | Status: AC
  Administered 2018-07-23: 10:00:00 via INTRAVENOUS
  Filled 2018-07-23: qty 1000

## 2018-07-23 MED ORDER — SODIUM CHLORIDE 0.9 % IV SOLN: 67.0000 mg/m2 | Freq: Once | INTRAVENOUS | Status: DC

## 2018-07-23 MED ORDER — HEPARIN SOD (PORK) LOCK FLUSH 100 UNIT/ML IV SOLN
INTRAVENOUS | Status: AC
  Filled 2018-07-23: qty 5

## 2018-07-26 ENCOUNTER — Other Ambulatory Visit: Payer: Self-pay | Admitting: *Deleted

## 2018-07-26 MED ORDER — OXYCODONE HCL 5 MG PO TABS: 5.0000 mg | ORAL_TABLET | Freq: Four times a day (QID) | ORAL | 0 refills | Status: DC | PRN

## 2018-08-12 NOTE — Progress Notes (Signed)
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  INTERVAL HISTORY: Patient returns to clinic today for further evaluation and consideration of cycle 7 of dose reduced Taxotere.  She continues to have chronic weakness and fatigue.  Her peripheral neuropathy is essentially unchanged.  She does not complain of pain today. She continues to have occasional dizziness that is chronic and unchanged.  She has no other neurologic complaints. She denies any recent fevers or illnesses. She denies any chest pain, cough, hemoptysis, or shortness of breath. She denies nausea, constipation, diarrhea, or vomiting.  She has no urinary complaints.  Patient offers no further specific complaints today.  REVIEW OF SYSTEMS:   Review of Systems  Constitutional: Positive for malaise/fatigue. Negative for fever and weight loss.  HENT: Negative.  Negative for congestion.   Eyes: Positive for blurred vision.  Respiratory: Negative.  Negative for cough, hemoptysis and shortness of breath.   Cardiovascular: Negative.  Negative for chest pain and leg swelling.  Gastrointestinal: Negative.  Negative for abdominal pain and constipation.  Genitourinary: Negative.  Negative for flank pain.  Musculoskeletal: Negative.  Negative for joint pain and myalgias.  Skin: Negative.  Negative for rash.  Neurological: Positive for dizziness, tingling, sensory change and weakness. Negative for focal weakness and headaches.  Psychiatric/Behavioral: Negative.   Negative for memory loss. The patient is not nervous/anxious.     As per HPI. Otherwise, a complete review of systems is negative.  PAST MEDICAL HISTORY: Past Medical History:  Diagnosis Date  . Arthritis   . Blood dyscrasia   . Cancer of left lung ([place]) 08/2015   Rad + chemo tx's.  . Diabetes mellitus without complication ([place])   . Diabetic neuropathy ([place])   . High cholesterol   . Hypertension   . Insulin dependent diabetes mellitus ([place])     PAST SURGICAL HISTORY: Past Surgical History:  Procedure Laterality Date  . ABDOMINAL HYSTERECTOMY    . CESAREAN SECTION    . ELECTROMAGNETIC NAVIGATION BROCHOSCOPY N/A 11/26/2015   Procedure: ELECTROMAGNETIC NAVIGATION BRONCHOSCOPY;  Surgeon: Flora Lipps, MD;  Location: [facility];  Service: Cardiopulmonary;  Laterality: N/A;  . ENDOBRONCHIAL ULTRASOUND N/A 11/26/2015   Procedure: ENDOBRONCHIAL ULTRASOUND;  Surgeon: Flora Lipps, MD;  Location: [facility];  Service: Cardiopulmonary;  Laterality: N/A;  . PERIPHERAL VASCULAR CATHETERIZATION N/A 12/09/2015   Procedure: Glori Luis Cath Insertion;  Surgeon: Katha Cabal, MD;  Location: [facility];  Service: Cardiovascular;  Laterality: N/A;    FAMILY HISTORY: Reviewed and unchanged. No reported history of malignancy or chronic disease.     ADVANCED DIRECTIVES:    HEALTH MAINTENANCE: Social History   Tobacco Use  . Smoking status: Current Every Day Smoker    Packs/day: 0.30    Years: 35.00    Pack years: 10.50    Types: Cigarettes  . Smokeless tobacco: Never Used  . Tobacco comment: workiing on quitting, down to 2 packs per week now  Substance Use Topics  . Alcohol use: No    Alcohol/week: 0.0 standard drinks  . Drug use: No  No Known Allergies  Current Outpatient Medications  Medication Sig Dispense Refill  . albuterol (PROVENTIL HFA;VENTOLIN HFA) 108 (90 Base) MCG/ACT inhaler Inhale 2 puffs into the lungs every 6 (six) hours as needed for wheezing or shortness of  breath. 1 Inhaler 2  . atorvastatin (LIPITOR) 20 MG tablet Take 20 mg by mouth daily at 6 PM.    . gabapentin (NEURONTIN) 300 MG capsule Take 1 capsule (300 mg total) by mouth at bedtime. 30 capsule 2  . insulin glargine (LANTUS) 100 UNIT/ML injection Inject 0.52 mLs (52 Units total) into the skin daily. 10 mL 11  . levothyroxine (SYNTHROID, LEVOTHROID) 150 MCG tablet Take 200 mcg by mouth daily before breakfast.     . lidocaine-prilocaine (EMLA) cream Apply to affected area once 30 g 6  . linagliptin (TRADJENTA) 5 MG TABS tablet Take by mouth.    Marland Kitchen lisinopril (PRINIVIL,ZESTRIL) 10 MG tablet Take 10 mg by mouth daily.    . magnesium oxide (MAG-OX) 400 (241.3 Mg) MG tablet Take 1 tablet (400 mg total) by mouth daily. 14 tablet 0  . nystatin (MYCOSTATIN/NYSTOP) powder Apply topically 4 (four) times daily. 15 g 0  . oxyCODONE (OXY IR/ROXICODONE) 5 MG immediate release tablet Take 1 tablet (5 mg total) by mouth every 6 (six) hours as needed for severe pain. 60 tablet 0  . magic mouthwash w/lidocaine SOLN Take 5 mLs by mouth 4 (four) times daily. (Patient not taking: Reported on 07/02/2018) 240 mL 1  . polyethylene glycol (MIRALAX / GLYCOLAX) packet Take 17 g by mouth daily as needed for mild constipation. (Patient not taking: Reported on 07/02/2018) 30 each 0   No current facility-administered medications for this visit.    Facility-Administered Medications Ordered in Other Visits  Medication Dose Route Frequency Provider Last Rate Last Dose  . heparin lock flush 100 unit/mL  500 Units Intravenous Once [name], MD      . heparin lock flush 100 unit/mL  500 Units Intravenous Once [name], MD      . sodium chloride flush (NS) 0.9 % injection 10 mL  10 mL Intravenous Once [name], MD      . sodium chloride flush (NS) 0.9 % injection 10 mL  10 mL Intravenous PRN [name], MD   10 mL at 08/13/18 0902    OBJECTIVE: Vitals:   08/13/18 0938  BP: (!) 95/58    Pulse: 79  Resp: 18  Temp: (!) 96.2 F (35.7 C)     Body mass index is 41.97 kg/m.    ECOG FS:2 - Symptomatic, <50% confined to bed  General: Well-developed, well-nourished, no acute distress.  Sitting in a wheelchair. Eyes: Pink conjunctiva, anicteric sclera. HEENT: Normocephalic, moist mucous membranes. Lungs: Clear to auscultation bilaterally. Heart: Regular rate and rhythm. No rubs, murmurs, or gallops. Abdomen: Soft, nontender, nondistended. No organomegaly noted, normoactive bowel sounds. Musculoskeletal: No edema, cyanosis, or clubbing. Neuro: Alert, answering all questions appropriately. Cranial nerves grossly intact. Skin: No rashes or petechiae noted. Psych: Normal affect.  LAB RESULTS:  Lab Results  Component Value Date   NA 141 08/13/2018   K 4.1 08/13/2018   CL 108 08/13/2018   CO2 26 08/13/2018   GLUCOSE 168 (H) 08/13/2018   BUN 17 08/13/2018   CREATININE 1.05 (H) 08/13/2018   CALCIUM 8.7 (L) 08/13/2018   PROT 6.1 (L) 08/13/2018   ALBUMIN 3.1 (L) 08/13/2018   AST 17 08/13/2018   ALT 9 08/13/2018   ALKPHOS  60 08/13/2018   BILITOT 0.6 08/13/2018   GFRNONAA 53 (L) 08/13/2018   GFRAA >60 08/13/2018    Lab Results  Component Value Date   WBC 4.8 08/13/2018   NEUTROABS 3.8 08/13/2018   HGB 9.0 (L) 08/13/2018   HCT 27.4 (L) 08/13/2018   MCV 89.1 08/13/2018   PLT 145 (L) 08/13/2018   Lab Results  Component Value Date   IRON 66 02/26/2018   TIBC 289 02/26/2018   IRONPCTSAT 23 02/26/2018    Lab Results  Component Value Date   FERRITIN 178 02/26/2018     STUDIES: No results found.  Oncology treatment history:  Patient completed her initial treatment with concurrent chemotherapy and XRT on March 07, 2016. She then underwent 2 cycles of consolidation carboplatinum and Taxol completing on Apr 27, 2016.  CT scan results from Apr 18, 2017 revealed progression of disease. She subsequently received additional XRT and proceeded with nivolumab every 2  weeks.  She completed a total of 13 infusions of nivolumab on November 06, 2017.  PET scan on November 30, 2017 revealed progression of disease.  Patient started carboplatinum and gemcitabine on December 11, 2017.  Single agent Taxotere was initiated on April 02, 2018.  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  PET scan results from June 19, 2018 reviewed independently revealing mild improvement of disease burden.  Because of patient's peripheral neuropathy, Taxotere has been dose reduced to 60 mg/m every 3 weeks.  Proceed with cycle 7 today.  Unable to perform OmniSeq testing secondary to insufficient tissue.  Return to clinic in 3 weeks for further evaluation and consideration of cycle 8.  Plan to reimage in October 2019. 2. Headaches/dizziness: Chronic and unchanged.  Patient has known vascular malformation. MRI of the brain reviewed independently with no obvious metastatic disease. 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  Continue follow-up with neurosurgery as indicated. 3. Anemia: Patient's hemoglobin is decreased, but unchanged at 9.0.  She does not require transfusion today.  4. Thrombocytopenia: Mild, monitor. 5.  Neutropenia: Patient became significantly neutropenic with previous cycle.  Her white blood cell count continues to be within normal limits.  She does not require Udenyca support at this time, but will consider her cycles. 5. Hyperglycemia: Patient's blood glucose remains chronically elevated, but overall she has improved control.  Continue diabetic medications as prescribed.  Appreciate endocrinology input.   6. Pain: Chronic and well controlled.  Continue oxycodone as needed.  7.  Hypothyroidism: Continue Synthroid and follow-up with  endocrinology as indicated. 8.  Diarrhea: Continue OTC treatments as needed.   Patient expressed understanding and was in agreement with this plan. She also understands that She can call clinic at any time with any questions, concerns, or complaints.    [name], MD   08/13/2018 1:11 PM

## 2018-08-13 ENCOUNTER — Inpatient Hospital Stay: Payer: PPO | Attending: Oncology

## 2018-08-13 ENCOUNTER — Inpatient Hospital Stay: Payer: PPO

## 2018-08-13 ENCOUNTER — Encounter: Payer: Self-pay | Admitting: Oncology

## 2018-08-13 ENCOUNTER — Inpatient Hospital Stay (HOSPITAL_BASED_OUTPATIENT_CLINIC_OR_DEPARTMENT_OTHER): Payer: PPO | Admitting: Oncology

## 2018-08-13 VITALS — BP 95/58 | HR 79 | Temp 96.2°F | Resp 18 | Wt 244.5 lb

## 2018-08-13 VITALS — BP 103/70 | HR 75 | Resp 20

## 2018-08-13 DIAGNOSIS — E039 Hypothyroidism, unspecified: Secondary | ICD-10-CM | POA: Diagnosis not present

## 2018-08-13 DIAGNOSIS — Q279 Congenital malformation of peripheral vascular system, unspecified: Secondary | ICD-10-CM | POA: Insufficient documentation

## 2018-08-13 DIAGNOSIS — G8929 Other chronic pain: Secondary | ICD-10-CM | POA: Diagnosis not present

## 2018-08-13 DIAGNOSIS — C3412 Malignant neoplasm of upper lobe, left bronchus or lung: Secondary | ICD-10-CM

## 2018-08-13 DIAGNOSIS — Z5111 Encounter for antineoplastic chemotherapy: Secondary | ICD-10-CM | POA: Diagnosis not present

## 2018-08-13 DIAGNOSIS — A0472 Enterocolitis due to Clostridium difficile, not specified as recurrent: Secondary | ICD-10-CM | POA: Diagnosis not present

## 2018-08-13 DIAGNOSIS — D649 Anemia, unspecified: Secondary | ICD-10-CM

## 2018-08-13 DIAGNOSIS — E1165 Type 2 diabetes mellitus with hyperglycemia: Secondary | ICD-10-CM

## 2018-08-13 LAB — CBC WITH DIFFERENTIAL/PLATELET
BASOS ABS: 0 10*3/uL (ref 0–0.1)
Basophils Relative: 1 %
EOS PCT: 0 %
Eosinophils Absolute: 0 10*3/uL (ref 0–0.7)
HCT: 27.4 % — ABNORMAL LOW (ref 35.0–47.0)
Hemoglobin: 9 g/dL — ABNORMAL LOW (ref 12.0–16.0)
LYMPHS PCT: 14 %
Lymphs Abs: 0.7 10*3/uL — ABNORMAL LOW (ref 1.0–3.6)
MCH: 29.4 pg (ref 26.0–34.0)
MCHC: 33 g/dL (ref 32.0–36.0)
MCV: 89.1 fL (ref 80.0–100.0)
MONO ABS: 0.3 10*3/uL (ref 0.2–0.9)
Monocytes Relative: 6 %
Neutro Abs: 3.8 10*3/uL (ref 1.4–6.5)
Neutrophils Relative %: 79 %
PLATELETS: 145 10*3/uL — AB (ref 150–440)
RBC: 3.07 MIL/uL — AB (ref 3.80–5.20)
RDW: 18.9 % — AB (ref 11.5–14.5)
WBC: 4.8 10*3/uL (ref 3.6–11.0)

## 2018-08-13 LAB — COMPREHENSIVE METABOLIC PANEL
ALT: 9 U/L (ref 0–44)
ANION GAP: 7 (ref 5–15)
AST: 17 U/L (ref 15–41)
Albumin: 3.1 g/dL — ABNORMAL LOW (ref 3.5–5.0)
Alkaline Phosphatase: 60 U/L (ref 38–126)
BUN: 17 mg/dL (ref 8–23)
CHLORIDE: 108 mmol/L (ref 98–111)
CO2: 26 mmol/L (ref 22–32)
Calcium: 8.7 mg/dL — ABNORMAL LOW (ref 8.9–10.3)
Creatinine, Ser: 1.05 mg/dL — ABNORMAL HIGH (ref 0.44–1.00)
GFR calc non Af Amer: 53 mL/min — ABNORMAL LOW (ref >60)
Glucose, Bld: 168 mg/dL — ABNORMAL HIGH (ref 70–99)
POTASSIUM: 4.1 mmol/L (ref 3.5–5.1)
SODIUM: 141 mmol/L (ref 135–145)
Total Bilirubin: 0.6 mg/dL (ref 0.3–1.2)
Total Protein: 6.1 g/dL — ABNORMAL LOW (ref 6.5–8.1)

## 2018-08-13 LAB — SAMPLE TO BLOOD BANK

## 2018-08-13 MED ORDER — SODIUM CHLORIDE 0.9 % IV SOLN
60.0000 mg/m2 | Freq: Once | INTRAVENOUS | Status: AC
  Administered 2018-08-13: 140 mg via INTRAVENOUS
  Filled 2018-08-13: qty 14

## 2018-08-13 MED ORDER — HEPARIN SOD (PORK) LOCK FLUSH 100 UNIT/ML IV SOLN
500.0000 [IU] | Freq: Once | INTRAVENOUS | Status: DC
  Filled 2018-08-13: qty 5

## 2018-08-13 MED ORDER — SODIUM CHLORIDE 0.9% FLUSH
10.0000 mL | INTRAVENOUS | Status: DC | PRN
  Administered 2018-08-13: 10 mL via INTRAVENOUS
  Filled 2018-08-13: qty 10

## 2018-08-13 MED ORDER — DEXAMETHASONE SODIUM PHOSPHATE 10 MG/ML IJ SOLN
10.0000 mg | Freq: Once | INTRAMUSCULAR | Status: AC
  Administered 2018-08-13: 10 mg via INTRAVENOUS
  Filled 2018-08-13: qty 1

## 2018-08-13 MED ORDER — SODIUM CHLORIDE 0.9 % IV SOLN
Freq: Once | INTRAVENOUS | Status: AC
  Administered 2018-08-13: 10:00:00 via INTRAVENOUS
  Filled 2018-08-13: qty 250

## 2018-08-13 MED ORDER — HEPARIN SOD (PORK) LOCK FLUSH 100 UNIT/ML IV SOLN
500.0000 [IU] | Freq: Once | INTRAVENOUS | Status: AC | PRN
  Administered 2018-08-13: 500 [IU]

## 2018-08-13 NOTE — Progress Notes (Signed)
Pt in for follow up and treatment today. Reports "having diarrhea multiple times over the last week.  Also, reports having numbness, dizziness and blurry vision.

## 2018-08-22 ENCOUNTER — Other Ambulatory Visit: Payer: Self-pay | Admitting: Oncology

## 2018-08-22 ENCOUNTER — Other Ambulatory Visit: Payer: Self-pay

## 2018-08-22 ENCOUNTER — Inpatient Hospital Stay: Payer: PPO

## 2018-08-22 ENCOUNTER — Ambulatory Visit
Admission: RE | Admit: 2018-08-22 | Discharge: 2018-08-22 | Disposition: A | Discharge status: Home / Self Care | Payer: PPO | Source: Ambulatory Visit | Attending: Oncology | Admitting: Oncology

## 2018-08-22 ENCOUNTER — Encounter: Payer: Self-pay | Admitting: Oncology

## 2018-08-22 ENCOUNTER — Inpatient Hospital Stay (HOSPITAL_BASED_OUTPATIENT_CLINIC_OR_DEPARTMENT_OTHER): Payer: PPO | Admitting: Oncology

## 2018-08-22 VITALS — BP 89/58 | HR 87 | Temp 98.1°F | Resp 20

## 2018-08-22 DIAGNOSIS — R911 Solitary pulmonary nodule: Secondary | ICD-10-CM | POA: Diagnosis not present

## 2018-08-22 DIAGNOSIS — R197 Diarrhea, unspecified: Secondary | ICD-10-CM

## 2018-08-22 DIAGNOSIS — R05 Cough: Secondary | ICD-10-CM | POA: Insufficient documentation

## 2018-08-22 DIAGNOSIS — D649 Anemia, unspecified: Secondary | ICD-10-CM

## 2018-08-22 DIAGNOSIS — R059 Cough, unspecified: Secondary | ICD-10-CM

## 2018-08-22 DIAGNOSIS — E860 Dehydration: Secondary | ICD-10-CM

## 2018-08-22 DIAGNOSIS — Z5111 Encounter for antineoplastic chemotherapy: Secondary | ICD-10-CM | POA: Diagnosis not present

## 2018-08-22 DIAGNOSIS — C3412 Malignant neoplasm of upper lobe, left bronchus or lung: Secondary | ICD-10-CM | POA: Diagnosis not present

## 2018-08-22 DIAGNOSIS — R112 Nausea with vomiting, unspecified: Secondary | ICD-10-CM

## 2018-08-22 DIAGNOSIS — Z95828 Presence of other vascular implants and grafts: Secondary | ICD-10-CM

## 2018-08-22 DIAGNOSIS — R531 Weakness: Secondary | ICD-10-CM | POA: Diagnosis not present

## 2018-08-22 LAB — COMPREHENSIVE METABOLIC PANEL
ALT: 10 U/L (ref 0–44)
ANION GAP: 8 (ref 5–15)
AST: 15 U/L (ref 15–41)
Albumin: 3 g/dL — ABNORMAL LOW (ref 3.5–5.0)
Alkaline Phosphatase: 48 U/L (ref 38–126)
BUN: 20 mg/dL (ref 8–23)
CALCIUM: 8.7 mg/dL — AB (ref 8.9–10.3)
CO2: 24 mmol/L (ref 22–32)
Chloride: 107 mmol/L (ref 98–111)
Creatinine, Ser: 1.15 mg/dL — ABNORMAL HIGH (ref 0.44–1.00)
GFR calc non Af Amer: 48 mL/min — ABNORMAL LOW (ref >60)
GFR, EST AFRICAN AMERICAN: 55 mL/min — AB (ref >60)
Glucose, Bld: 158 mg/dL — ABNORMAL HIGH (ref 70–99)
Potassium: 3.6 mmol/L (ref 3.5–5.1)
SODIUM: 139 mmol/L (ref 135–145)
TOTAL PROTEIN: 6.2 g/dL — AB (ref 6.5–8.1)
Total Bilirubin: 0.9 mg/dL (ref 0.3–1.2)

## 2018-08-22 LAB — CBC WITH DIFFERENTIAL/PLATELET
BASOS ABS: 0 10*3/uL (ref 0–0.1)
BASOS PCT: 1 %
Eosinophils Absolute: 0 10*3/uL (ref 0–0.7)
Eosinophils Relative: 1 %
HCT: 23.5 % — ABNORMAL LOW (ref 35.0–47.0)
Hemoglobin: 7.9 g/dL — ABNORMAL LOW (ref 12.0–16.0)
Lymphocytes Relative: 31 %
Lymphs Abs: 0.3 10*3/uL — ABNORMAL LOW (ref 1.0–3.6)
MCH: 29.1 pg (ref 26.0–34.0)
MCHC: 33.4 g/dL (ref 32.0–36.0)
MCV: 87 fL (ref 80.0–100.0)
Monocytes Absolute: 0.3 10*3/uL (ref 0.2–0.9)
Monocytes Relative: 29 %
NEUTROS ABS: 0.4 10*3/uL — AB (ref 1.4–6.5)
NEUTROS PCT: 38 %
PLATELETS: 125 10*3/uL — AB (ref 150–440)
RBC: 2.7 MIL/uL — AB (ref 3.80–5.20)
RDW: 18.7 % — ABNORMAL HIGH (ref 11.5–14.5)
WBC: 0.9 10*3/uL — CL (ref 3.6–11.0)

## 2018-08-22 LAB — PREPARE RBC (CROSSMATCH)

## 2018-08-22 LAB — MAGNESIUM: MAGNESIUM: 1 mg/dL — AB (ref 1.7–2.4)

## 2018-08-22 MED ORDER — SODIUM CHLORIDE 0.9 % IV SOLN: 2.0000 g | Freq: Once | INTRAVENOUS | Status: DC

## 2018-08-22 MED ORDER — ONDANSETRON HCL 4 MG/2ML IJ SOLN
8.0000 mg | Freq: Once | INTRAMUSCULAR | Status: AC
  Administered 2018-08-22: 8 mg via INTRAVENOUS
  Filled 2018-08-22: qty 4

## 2018-08-22 MED ORDER — MAGNESIUM SULFATE 2 GM/50ML IV SOLN
2.0000 g | Freq: Once | INTRAVENOUS | Status: AC
  Administered 2018-08-22: 2 g via INTRAVENOUS
  Filled 2018-08-22: qty 50

## 2018-08-22 MED ORDER — SODIUM CHLORIDE 0.9% FLUSH
10.0000 mL | INTRAVENOUS | Status: DC | PRN
  Administered 2018-08-22: 10 mL via INTRAVENOUS
  Filled 2018-08-22: qty 10

## 2018-08-22 MED ORDER — SODIUM CHLORIDE 0.9 % IV SOLN: 4.0000 g | Freq: Once | INTRAVENOUS | Status: DC

## 2018-08-22 MED ORDER — DEXAMETHASONE SODIUM PHOSPHATE 10 MG/ML IJ SOLN
10.0000 mg | Freq: Once | INTRAMUSCULAR | Status: AC
  Administered 2018-08-22: 10 mg via INTRAVENOUS
  Filled 2018-08-22: qty 1

## 2018-08-22 MED ORDER — SODIUM CHLORIDE 0.9 % IV SOLN
Freq: Once | INTRAVENOUS | Status: AC
  Administered 2018-08-22: 14:00:00 via INTRAVENOUS
  Filled 2018-08-22: qty 250

## 2018-08-22 MED ORDER — SODIUM CHLORIDE 0.9 % IV SOLN: 10.0000 mg | Freq: Once | INTRAVENOUS | Status: DC

## 2018-08-22 MED ORDER — HEPARIN SOD (PORK) LOCK FLUSH 100 UNIT/ML IV SOLN
500.0000 [IU] | Freq: Once | INTRAVENOUS | Status: AC
  Administered 2018-08-22: 500 [IU] via INTRAVENOUS

## 2018-08-22 NOTE — Progress Notes (Signed)
Orders placed for 1 unit PRBC tomorrow in [place].  She will also need 4 grams Magnesium. Orders placed.   Faythe Casa, NP 08/22/2018 3:06 PM

## 2018-08-22 NOTE — Progress Notes (Addendum)
Symptom Management Consult note [facility]  [phone] [phone]  [name], [name] [name], [name] [name], [name] [name], [name], [name], [name]   Name of the patient: Susan Willis  [id number]  1951-11-14   Date of visit: 08/22/2018  Diagnosis- Malignant neoplasm of upper lobe of left lung ([place]) - Plan: Magnesium, CBC with Differential, Comprehensive metabolic panel  Weakness  Diarrhea, unspecified type - Plan: C difficile quick screen w PCR reflex, Gastrointestinal Panel by PCR , Stool  Hypomagnesemia  Chief Complaint: Weakness, hypomagnesemia, diarrhea  Oncology History:  Oncology History    Patient completed her initial treatment with concurrent chemotherapy and XRT on March 07, 2016. She then underwent 2 cycles of consolidation carboplatinum and Taxol completing on Apr 27, 2016.  CT scan results from Apr 18, 2017 revealed progression of disease. She subsequently received additional XRT and proceeded with nivolumab every 2 weeks.  She completed a total of 13 infusions of nivolumab on November 06, 2017.  PET scan on November 30, 2017 revealed progression of disease.  Patient started carboplatinum and gemcitabine on December 11, 2017.  Single agent Taxotere was initiated on April 02, 2017.       Malignant neoplasm of upper lobe of left lung ([place])   12/07/2015 Initial Diagnosis    Malignant neoplasm of upper lobe of left lung ([place])    04/01/2018 -  Chemotherapy    The patient had pegfilgrastim-cbqv (UDENYCA) injection 6 mg, 6 mg, Subcutaneous, Once, 6 of 11 cycles DOCEtaxel (TAXOTERE) 170 mg in sodium chloride 0.9 % 250 mL chemo infusion, 75 mg/m2 = 170 mg, Intravenous,  Once, 7 of 12 cycles Dose modification: 67 mg/m2  (original dose 75 mg/m2, Cycle 2, Reason: Dose not tolerated), 60 mg/m2 (original dose 75 mg/m2, Cycle 7, Reason: Dose not tolerated), 60 mg/m2 (original dose 60 mg/m2, Cycle 6, Reason: Dose not tolerated) Administration: 170 mg (04/02/2018), 150 mg (05/02/2018), 150 mg (05/21/2018), 150 mg (06/11/2018), 150 mg (07/02/2018), 140 mg (08/13/2018), 140 mg (07/23/2018)  for chemotherapy treatment.       Interval History: Patient was recently seen and evaluated by primary medical oncologist Dr. Grayland Ormond on 07/23/2018 for further evaluation and consideration of cycle 6 of dose reduced Taxotere.  At that visit she continued to complain of peripheral neuropathy with slight improvement after dose reduction.  She continued to have occasional dizziness that is chronic and unchanged.  She denied any new neurological complaints.  Most recent imaging from July 2019 revealed mild improvement of disease burden.  She was scheduled to return to clinic in approximately 3 weeks for consideration of cycle 7.  Subjective:     Susan Willis is a 67 y.o. female who presents for evaluation of  cough, weakness and diarrhea.  Symptoms began several weeks ago and are progressive. The patient feels the symptoms began with: most recent chemothearpy.  Associated symptoms inlude change in appetite, diffuse soft tissue aches and pains, fatigue with paradoxical insomnia, general malaise and dizziness.  Patient denies constipation, fever, GI blood loss and unusual rashes. Symptoms have gradually worsened. Symptom severity: severe. Previous visits for this problem: yes, last seen 4 months ago by me.  In the past, she has needed IV magnesium and IV fluids for orthostatics hypotension.   The following portions of the patient's history  were reviewed and updated as appropriate: allergies, current medications, past family history, past medical history, past social history, past surgical history and problem list.  Review of Systems A comprehensive  review of systems was negative except for: Respiratory: positive for cough, sputum and wheezing Gastrointestinal: positive for abdominal pain, change in bowel habits, diarrhea and nausea Genitourinary: positive for urinary incontinence Integument/breast: positive for skin lesion(s) Musculoskeletal: positive for arthralgias, muscle weakness and myalgias Neurological: positive for dizziness, gait problems, paresthesia and weakness    Objective:    BP (!) 89/58 (BP Location: Right Arm, Patient Position: Sitting)   Pulse 87   Temp 98.1 F (36.7 C) (Tympanic)   Resp 20  General appearance: fatigued and morbidly obese Lungs: diminished breath sounds RLL and RML and wheezes RLL and RML Abdomen: abnormal findings:  hyperactive bowel sounds and obese Extremities: extremities normal, atraumatic, no cyanosis or edema Pulses: 2+ and symmetric Skin: Skin color, texture, turgor normal. No rashes or lesions or erythema - groin    Assessment:   Fatigue and weakness likely due to anemia and hypomagnesemia r/t recent chemotherapy and diarrhea.  Last dose reduced Taxotere given on 08/15/2018.  Magnesium level of 1 and hemoglobin 7.9.  Her ANC is 400 today.  She is afebrile.  Collected GI panel and C. difficile to rule out infection.  Chest x-ray d/t cough and wheezing.  Has unrelated folliculitis to right perianal/intersphincteric area.  Erythema, hardening and drainage present.  Drainage is purulent.  It is tender to touch.  States she has had this for over 1 month.  Usually self limiting and resolves spontaneously. Given immunocompromise status and purulent drainage  will treat with oral antibiotic.  Due to high frequency of resistance to patients with staphylococcal folliculitis to penicillins, it is recommended to use Bactrim DS BID for 7-10 days or cephalexin QID for 7 days.   Plan:    Discussed diagnosis with patient. See orders for lab evaluation. Will get set up for 1 unit PRBC tomorrow.    Will  give 2 g IV magnesium with 1 L NaCl in clinic today. Will set up for 4 g IV magnesium tomorrow after PRBC. (Patient will have to go to [facility] clinic for transfusion).  Patient may need to take oral magnesium tablets prophylactically if continuing chemotherapy. Chest x-ray.  Results: Possible increase in left lower lobe airspace disease compared with most recent PET scan.  Likely related to atelectasis or inflammation.  No additional acute findings. GI panel pending. C. difficile panel: Positive for antigen negative for toxin.  Positive for toxigenic C. Difficile resulted on 08/24/2018. RX Fidaxomicin 200 mg BID for 10 days.  Called and spoke with daughter Colletta Maryland regarding results and to pick up antibiotic as soon as possible.  RX Bactrim 800/160 mg DS BID for 7 days for folliculitis.  She has no known allergies.  Creatinine clearance 57.1.  No dosage adjustment needed.  Recommended warm compresses and/or baths for comfort and to promote drainage.  She knows to keep area clean with soap and water.  May apply OTC antibacterial cream such as bacitracin or Neosporin.  Maintain neutropenic precautions.  ANC 400.  Addendum: Patient reassessed while receiving 1 unit PRBCs and 4 g magnesium.  Blood pressure improved from yesterday.  States she is feeling better.  Reviewed results from labs.  All questions answered.  RTC on Monday for labs only and as scheduled to see Dr. Grayland Ormond on 09/03/18 for cycle 7 Taxotere.   Greater than 50% was spent  in counseling and coordination of care with this patient including but not limited to discussion of the relevant topics above (See A&P) including, but not limited to diagnosis and management of acute and chronic medical conditions.   Faythe Casa, NP 08/23/2018 10:42 AM

## 2018-08-23 ENCOUNTER — Other Ambulatory Visit: Payer: Self-pay | Admitting: Oncology

## 2018-08-23 ENCOUNTER — Inpatient Hospital Stay: Payer: PPO

## 2018-08-23 VITALS — BP 112/73 | HR 76 | Temp 95.0°F | Resp 20

## 2018-08-23 DIAGNOSIS — D649 Anemia, unspecified: Secondary | ICD-10-CM

## 2018-08-23 DIAGNOSIS — R197 Diarrhea, unspecified: Secondary | ICD-10-CM

## 2018-08-23 DIAGNOSIS — Z5111 Encounter for antineoplastic chemotherapy: Secondary | ICD-10-CM | POA: Diagnosis not present

## 2018-08-23 LAB — GASTROINTESTINAL PANEL BY PCR, STOOL (REPLACES STOOL CULTURE)
Adenovirus F40/41: NOT DETECTED
Astrovirus: NOT DETECTED
CAMPYLOBACTER SPECIES: NOT DETECTED
CRYPTOSPORIDIUM: NOT DETECTED
Cyclospora cayetanensis: NOT DETECTED
ENTEROTOXIGENIC E COLI (ETEC): NOT DETECTED
Entamoeba histolytica: NOT DETECTED
Enteroaggregative E coli (EAEC): NOT DETECTED
Enteropathogenic E coli (EPEC): NOT DETECTED
GIARDIA LAMBLIA: NOT DETECTED
Norovirus GI/GII: NOT DETECTED
PLESIMONAS SHIGELLOIDES: NOT DETECTED
Rotavirus A: NOT DETECTED
SALMONELLA SPECIES: NOT DETECTED
SAPOVIRUS (I, II, IV, AND V): NOT DETECTED
SHIGA LIKE TOXIN PRODUCING E COLI (STEC): NOT DETECTED
Shigella/Enteroinvasive E coli (EIEC): NOT DETECTED
VIBRIO CHOLERAE: NOT DETECTED
Vibrio species: NOT DETECTED
Yersinia enterocolitica: NOT DETECTED

## 2018-08-23 LAB — C DIFFICILE QUICK SCREEN W PCR REFLEX
C DIFFICLE (CDIFF) ANTIGEN: POSITIVE — AB
C Diff toxin: NEGATIVE

## 2018-08-23 LAB — CLOSTRIDIUM DIFFICILE BY PCR, REFLEXED: CDIFFPCR: POSITIVE — AB

## 2018-08-23 MED ORDER — DIPHENHYDRAMINE HCL 25 MG PO CAPS
25.0000 mg | ORAL_CAPSULE | Freq: Once | ORAL | Status: AC
  Administered 2018-08-23: 25 mg via ORAL

## 2018-08-23 MED ORDER — DIPHENHYDRAMINE HCL 25 MG PO CAPS
ORAL_CAPSULE | ORAL | Status: AC
  Filled 2018-08-23: qty 1

## 2018-08-23 MED ORDER — ACETAMINOPHEN 325 MG PO TABS
ORAL_TABLET | ORAL | Status: AC
  Filled 2018-08-23: qty 2

## 2018-08-23 MED ORDER — MAGNESIUM SULFATE 4 GM/100ML IV SOLN
4.0000 g | Freq: Once | INTRAVENOUS | Status: AC
  Administered 2018-08-23: 4 g via INTRAVENOUS
  Filled 2018-08-23 (×2): qty 100

## 2018-08-23 MED ORDER — HEPARIN SOD (PORK) LOCK FLUSH 100 UNIT/ML IV SOLN
500.0000 [IU] | Freq: Once | INTRAVENOUS | Status: AC
  Administered 2018-08-23: 500 [IU] via INTRAVENOUS

## 2018-08-23 MED ORDER — SODIUM CHLORIDE 0.9% IV SOLUTION
250.0000 mL | Freq: Once | INTRAVENOUS | Status: AC
  Administered 2018-08-23: 250 mL via INTRAVENOUS
  Filled 2018-08-23: qty 250

## 2018-08-23 MED ORDER — OXYCODONE HCL 5 MG PO TABS: 5.0000 mg | ORAL_TABLET | Freq: Four times a day (QID) | ORAL | 0 refills | Status: DC | PRN

## 2018-08-23 MED ORDER — ACETAMINOPHEN 325 MG PO TABS
650.0000 mg | ORAL_TABLET | Freq: Once | ORAL | Status: AC
  Administered 2018-08-23: 650 mg via ORAL

## 2018-08-23 MED ORDER — SULFAMETHOXAZOLE-TRIMETHOPRIM 800-160 MG PO TABS: 1.0000 | ORAL_TABLET | Freq: Two times a day (BID) | ORAL | 0 refills | Status: AC

## 2018-08-23 NOTE — Progress Notes (Signed)
Patient called cancer center requesting refill of Oxycodone.   [place] Controlled Substance Reporting System reviewed and refill is appropriate on or after 08/14/18. Medication e-scribed to her pharmacy [facility] using [employer] 2-step verification process.     Faythe Casa, NP 08/23/2018 2:07 PM [phone]

## 2018-08-24 ENCOUNTER — Other Ambulatory Visit: Payer: Self-pay | Admitting: Oncology

## 2018-08-24 LAB — TYPE AND SCREEN
ABO/RH(D): O POS
ANTIBODY SCREEN: NEGATIVE
UNIT DIVISION: 0

## 2018-08-24 LAB — BPAM RBC
BLOOD PRODUCT EXPIRATION DATE: 201910162359
ISSUE DATE / TIME: 201909190943
Unit Type and Rh: 5100

## 2018-08-24 MED ORDER — FIDAXOMICIN 200 MG PO TABS: 200.0000 mg | ORAL_TABLET | Freq: Two times a day (BID) | ORAL | 0 refills | Status: DC

## 2018-08-24 MED ORDER — VANCOMYCIN HCL 125 MG PO CAPS: 125.0000 mg | ORAL_CAPSULE | Freq: Four times a day (QID) | ORAL | 0 refills | Status: AC

## 2018-08-27 ENCOUNTER — Inpatient Hospital Stay: Payer: PPO

## 2018-08-27 DIAGNOSIS — D649 Anemia, unspecified: Secondary | ICD-10-CM

## 2018-08-27 DIAGNOSIS — Z5111 Encounter for antineoplastic chemotherapy: Secondary | ICD-10-CM | POA: Diagnosis not present

## 2018-08-27 LAB — CBC WITH DIFFERENTIAL/PLATELET
BASOS ABS: 0 10*3/uL (ref 0–0.1)
BASOS PCT: 0 %
EOS ABS: 0 10*3/uL (ref 0–0.7)
Eosinophils Relative: 0 %
HCT: 30.6 % — ABNORMAL LOW (ref 35.0–47.0)
HEMOGLOBIN: 10 g/dL — AB (ref 12.0–16.0)
Lymphocytes Relative: 13 %
Lymphs Abs: 0.5 10*3/uL — ABNORMAL LOW (ref 1.0–3.6)
MCH: 27.7 pg (ref 26.0–34.0)
MCHC: 32.8 g/dL (ref 32.0–36.0)
MCV: 84.6 fL (ref 80.0–100.0)
Monocytes Absolute: 0.2 10*3/uL (ref 0.2–0.9)
Monocytes Relative: 6 %
NEUTROS PCT: 81 %
Neutro Abs: 3.3 10*3/uL (ref 1.4–6.5)
Platelets: 171 10*3/uL (ref 150–440)
RBC: 3.61 MIL/uL — ABNORMAL LOW (ref 3.80–5.20)
RDW: 22.1 % — ABNORMAL HIGH (ref 11.5–14.5)
WBC: 4.1 10*3/uL (ref 3.6–11.0)

## 2018-08-27 LAB — COMPREHENSIVE METABOLIC PANEL
ALT: 10 U/L (ref 0–44)
AST: 17 U/L (ref 15–41)
Albumin: 3.3 g/dL — ABNORMAL LOW (ref 3.5–5.0)
Alkaline Phosphatase: 55 U/L (ref 38–126)
Anion gap: 9 (ref 5–15)
BUN: 14 mg/dL (ref 8–23)
CHLORIDE: 104 mmol/L (ref 98–111)
CO2: 24 mmol/L (ref 22–32)
CREATININE: 1.34 mg/dL — AB (ref 0.44–1.00)
Calcium: 9 mg/dL (ref 8.9–10.3)
GFR, EST AFRICAN AMERICAN: 46 mL/min — AB (ref >60)
GFR, EST NON AFRICAN AMERICAN: 40 mL/min — AB (ref >60)
Glucose, Bld: 153 mg/dL — ABNORMAL HIGH (ref 70–99)
Potassium: 3.6 mmol/L (ref 3.5–5.1)
Sodium: 137 mmol/L (ref 135–145)
TOTAL PROTEIN: 6.3 g/dL — AB (ref 6.5–8.1)
Total Bilirubin: 0.7 mg/dL (ref 0.3–1.2)

## 2018-08-27 LAB — MAGNESIUM: MAGNESIUM: 1.7 mg/dL (ref 1.7–2.4)

## 2018-08-27 LAB — SAMPLE TO BLOOD BANK

## 2018-08-28 ENCOUNTER — Encounter: Payer: Self-pay | Admitting: Oncology

## 2018-08-28 NOTE — Progress Notes (Signed)
Spoke with patients daughter Colletta Maryland to see how she was feeling after initiating oral vancomycin last week for positive c-diff antigen and toxin.  Received IV fluids, 1 unit PRBCs and a total of 6 g of magnesium.  Repeat labs yesterday reveal dramatic improvement and levels are back to normal.  Daughter states diarrhea has improved and essentially resolved.  Patient still complains of fatigue, weakness and lack of appetite.  Daughter states "she has no energy to do anything".   She is scheduled to return to clinic on 09/03/2018 for repeat labs, MD assessment and cycle 8 Taxotere.  Unfortunately, she has had a tough time with Taxotere with several dose adjustments.  May not tolerate additional treatment at this time.    Most recent imaging (PET scan) from 06/19/2018 showed mild response to therapy.  No new areas identified.   Instructed daughter that fatigue and weakness should improve after C. difficile resolves.  We will closely monitor her for the next week to identify any needs she may have.  Advised frequent small meals including supplements with Ensure or boost products to help with strength.  Patient has had significant decline in weight since 07/02/2018.   Faythe Casa, NP 08/28/2018 3:11 PM

## 2018-09-02 NOTE — Progress Notes (Signed)
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  INTERVAL HISTORY: Patient returns to clinic today for further evaluation and discussion on whether or not to continue treatment with Taxotere.  She continues to have chronic weakness and fatigue and a decreased performance status.  Her diarrhea is improving.  Her peripheral neuropathy is essentially unchanged.  She has noticed increased pain.  She continues to have dizziness. She has no other neurologic complaints. She denies any recent fevers or illnesses. She denies any chest pain, cough, hemoptysis, or shortness of breath. She denies nausea, constipation, or vomiting.  She has no urinary complaints.  Patient feels generally terrible, but offers no further specific complaints.  REVIEW OF SYSTEMS:   Review of Systems  Constitutional: Positive for malaise/fatigue. Negative for fever and weight loss.  HENT: Negative.  Negative for congestion.   Eyes: Positive for blurred vision.  Respiratory: Negative.  Negative for cough, hemoptysis and shortness of breath.   Cardiovascular: Negative.  Negative for chest pain and leg swelling.  Gastrointestinal: Negative.  Negative for abdominal pain and constipation.  Genitourinary: Negative.  Negative for flank pain.  Musculoskeletal: Negative.  Negative for joint pain and myalgias.  Skin: Negative.  Negative for rash.  Neurological: Positive for dizziness, tingling, sensory change and weakness. Negative for focal weakness and  headaches.  Psychiatric/Behavioral: Negative.  Negative for memory loss. The patient is not nervous/anxious.     As per HPI. Otherwise, a complete review of systems is negative.  PAST MEDICAL HISTORY: Past Medical History:  Diagnosis Date  . Arthritis   . Blood dyscrasia   . Cancer of left lung ([place]) 08/2015   Rad + chemo tx's.  . Diabetes mellitus without complication ([place])   . Diabetic neuropathy ([place])   . High cholesterol   . Hypertension   . Insulin dependent diabetes mellitus ([place])     PAST SURGICAL HISTORY: Past Surgical History:  Procedure Laterality Date  . ABDOMINAL HYSTERECTOMY    . CESAREAN SECTION    . ELECTROMAGNETIC NAVIGATION BROCHOSCOPY N/A 11/26/2015   Procedure: ELECTROMAGNETIC NAVIGATION BRONCHOSCOPY;  Surgeon: Flora Lipps, MD;  Location: [facility] ORS;  Service: Cardiopulmonary;  Laterality: N/A;  . ENDOBRONCHIAL ULTRASOUND N/A 11/26/2015   Procedure: ENDOBRONCHIAL ULTRASOUND;  Surgeon: Flora Lipps, MD;  Location: [facility] ORS;  Service: Cardiopulmonary;  Laterality: N/A;  . PERIPHERAL VASCULAR CATHETERIZATION N/A 12/09/2015   Procedure: Glori Luis Cath Insertion;  Surgeon: Katha Cabal, MD;  Location: [facility];  Service: Cardiovascular;  Laterality: N/A;    FAMILY HISTORY: Reviewed and unchanged. No reported history of malignancy or chronic disease.     ADVANCED DIRECTIVES:    HEALTH MAINTENANCE: Social History   Tobacco Use  . Smoking status: Current Every Day Smoker    Packs/day: 0.30    Years: 35.00    Pack years: 10.50    Types: Cigarettes  . Smokeless tobacco: Never Used  . Tobacco comment: workiing on quitting, down to 2 packs per week now  Substance Use Topics  . Alcohol use: No    Alcohol/week: 0.0 standard drinks  .  Drug use: No     No Known Allergies  Current Outpatient Medications  Medication Sig Dispense Refill  . albuterol (PROVENTIL HFA;VENTOLIN HFA) 108 (90 Base) MCG/ACT inhaler Inhale 2 puffs into the lungs every 6  (six) hours as needed for wheezing or shortness of breath. 1 Inhaler 2  . atorvastatin (LIPITOR) 20 MG tablet Take 20 mg by mouth daily at 6 PM.    . gabapentin (NEURONTIN) 300 MG capsule Take 1 capsule (300 mg total) by mouth at bedtime. 30 capsule 2  . insulin glargine (LANTUS) 100 UNIT/ML injection Inject 0.52 mLs (52 Units total) into the skin daily. 10 mL 11  . levothyroxine (SYNTHROID, LEVOTHROID) 150 MCG tablet Take 200 mcg by mouth daily before breakfast.     . lidocaine-prilocaine (EMLA) cream Apply to affected area once 30 g 6  . linagliptin (TRADJENTA) 5 MG TABS tablet Take by mouth.    Marland Kitchen lisinopril (PRINIVIL,ZESTRIL) 10 MG tablet Take 10 mg by mouth daily.    . magic mouthwash w/lidocaine SOLN Take 5 mLs by mouth 4 (four) times daily. 240 mL 1  . magnesium oxide (MAG-OX) 400 (241.3 Mg) MG tablet Take 1 tablet (400 mg total) by mouth daily. 14 tablet 0  . nystatin (MYCOSTATIN/NYSTOP) powder Apply topically 4 (four) times daily. 15 g 0  . oxyCODONE (OXY IR/ROXICODONE) 5 MG immediate release tablet Take 1 tablet (5 mg total) by mouth every 6 (six) hours as needed for severe pain. 60 tablet 0  . polyethylene glycol (MIRALAX / GLYCOLAX) packet Take 17 g by mouth daily as needed for mild constipation. 30 each 0  . vancomycin (VANCOCIN HCL) 125 MG capsule Take 1 capsule (125 mg total) by mouth 4 (four) times daily. 40 capsule 0   No current facility-administered medications for this visit.    Facility-Administered Medications Ordered in Other Visits  Medication Dose Route Frequency Provider Last Rate Last Dose  . heparin lock flush 100 unit/mL  500 Units Intravenous Once [name], MD      . sodium chloride flush (NS) 0.9 % injection 10 mL  10 mL Intravenous Once [name], MD        OBJECTIVE: Vitals:   09/03/18 0912 09/03/18 0916  BP:  93/63  Pulse:  85  Resp: 16   Temp:  98.3 F (36.8 C)     Body mass index is 40.23 kg/m.    ECOG FS:2 - Symptomatic, <50%  confined to bed  General: Well-developed, well-nourished, no acute distress.  Sitting in a wheelchair. Eyes: Pink conjunctiva, anicteric sclera. HEENT: Normocephalic, moist mucous membranes. Lungs: Clear to auscultation bilaterally. Heart: Regular rate and rhythm. No rubs, murmurs, or gallops. Abdomen: Soft, nontender, nondistended. No organomegaly noted, normoactive bowel sounds. Musculoskeletal: No edema, cyanosis, or clubbing. Neuro: Alert, answering all questions appropriately. Cranial nerves grossly intact. Skin: No rashes or petechiae noted. Psych: Normal affect.  LAB RESULTS:  Lab Results  Component Value Date   NA 136 09/03/2018   K 3.9 09/03/2018   CL 103 09/03/2018   CO2 24 09/03/2018   GLUCOSE 124 (H) 09/03/2018   BUN 11 09/03/2018   CREATININE 1.41 (H) 09/03/2018   CALCIUM 9.2 09/03/2018   PROT 6.2 (L) 09/03/2018   ALBUMIN 3.3 (L) 09/03/2018   AST 22 09/03/2018   ALT 11 09/03/2018   ALKPHOS 63 09/03/2018   BILITOT 0.7 09/03/2018   GFRNONAA 37 (L) 09/03/2018   GFRAA 43 (L) 09/03/2018    Lab Results  Component Value  Date   WBC 4.8 09/03/2018   NEUTROABS 3.8 09/03/2018   HGB 9.7 (L) 09/03/2018   HCT 29.7 (L) 09/03/2018   MCV 85.3 09/03/2018   PLT 160 09/03/2018   Lab Results  Component Value Date   IRON 66 02/26/2018   TIBC 289 02/26/2018   IRONPCTSAT 23 02/26/2018    Lab Results  Component Value Date   FERRITIN 178 02/26/2018     STUDIES: Dg Chest 2 View  Result Date: 08/22/2018 CLINICAL DATA:  Coughing and wheezing for 2 months. Lung cancer. History of diabetes and hypertension. EXAM: CHEST - 2 VIEW COMPARISON:  Radiographs 08/23/2016.  PET-CT 06/19/2018. FINDINGS: Right IJ Port-A-Cath extends to the level of the lower SVC. The heart size is stable. Left upper lobe mass and adjacent parenchymal opacity obscuring the aortic arch are grossly stable from PET-CT of 2 months ago. Patchy left lower lobe airspace disease may be slightly increased. The  right lung is clear. No osseous abnormalities are identified. IMPRESSION: Possible increased left lower lobe airspace disease compared with PET-CT of 2 months ago. This could reflect atelectasis or inflammation. Grossly stable left upper lobe mass and adjacent parenchymal opacity. Electronically Signed   By: Richardean Sale M.D.   On: 08/22/2018 16:59    Oncology treatment history:  Patient completed her initial treatment with concurrent chemotherapy and XRT on March 07, 2016. She then underwent 2 cycles of consolidation carboplatinum and Taxol completing on Apr 27, 2016.  CT scan results from Apr 18, 2017 revealed progression of disease. She subsequently received additional XRT and proceeded with nivolumab every 2 weeks.  She completed a total of 13 infusions of nivolumab on November 06, 2017.  PET scan on November 30, 2017 revealed progression of disease.  Patient started carboplatinum and gemcitabine on December 11, 2017.  Single agent Taxotere was initiated on April 02, 2018.  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  PET scan results from June 19, 2018 reviewed independently revealing mild improvement of disease burden.  Because of patient's performance status plan was to discontinue Taxotere and only receive IV fluids today, but patient mistakenly received treatment despite physician request.  Plan to reimage in 1 to 2 weeks to assess if there is any progression of disease.  Patient will return to clinic 1 to 2 days after imaging to discuss the results.  We discussed at length enrolling in hospice, but patient wishes to have her scans prior to making this decision.   2. Headaches/dizziness: Chronic and unchanged.  Patient has known vascular malformation. MRI of the brain reviewed independently with no obvious metastatic disease. Neurosurgery has  recommended intervention for her vascular malformation, but given her progressive disease this is been placed on hold.  Repeat brain MRI in the next 1 to 2 weeks. 3. Anemia: Hemoglobin improved with recent transfusion.  Monitor.   4. Thrombocytopenia: Resolved. 5.  Neutropenia: Resolved.   5. Hyperglycemia: Patient's blood glucose remains chronically elevated, but overall she has improved control.  Continue diabetic medications as prescribed.  Appreciate endocrinology input.   6. Pain: Chronic and well controlled.  Continue oxycodone as needed.  7.  Hypothyroidism: Continue Synthroid and follow-up with endocrinology as indicated. 8.  Diarrhea: Improving.  Patient was found to be positive for C. difficile.  Continue current antibiotics.  1 L of IV  fluids as above. 9.  Hypomagnesia: Likely secondary to ongoing diarrhea.  Proceed with 2 g IV magnesium today.   Patient expressed understanding and was in agreement with this plan. She also understands that She can call clinic at any time with any questions, concerns, or complaints.    [name], MD   09/05/2018 10:42 AM

## 2018-09-03 ENCOUNTER — Other Ambulatory Visit: Payer: Self-pay | Admitting: *Deleted

## 2018-09-03 ENCOUNTER — Inpatient Hospital Stay (HOSPITAL_BASED_OUTPATIENT_CLINIC_OR_DEPARTMENT_OTHER): Payer: PPO | Admitting: Oncology

## 2018-09-03 ENCOUNTER — Encounter: Payer: Self-pay | Admitting: Oncology

## 2018-09-03 ENCOUNTER — Other Ambulatory Visit: Payer: Self-pay

## 2018-09-03 ENCOUNTER — Inpatient Hospital Stay: Payer: PPO

## 2018-09-03 VITALS — BP 93/63 | HR 85 | Temp 98.3°F | Resp 16 | Ht 64.0 in | Wt 234.4 lb

## 2018-09-03 DIAGNOSIS — D649 Anemia, unspecified: Secondary | ICD-10-CM | POA: Diagnosis not present

## 2018-09-03 DIAGNOSIS — G8929 Other chronic pain: Secondary | ICD-10-CM | POA: Diagnosis not present

## 2018-09-03 DIAGNOSIS — C3412 Malignant neoplasm of upper lobe, left bronchus or lung: Secondary | ICD-10-CM

## 2018-09-03 DIAGNOSIS — A0472 Enterocolitis due to Clostridium difficile, not specified as recurrent: Secondary | ICD-10-CM | POA: Diagnosis not present

## 2018-09-03 DIAGNOSIS — Q279 Congenital malformation of peripheral vascular system, unspecified: Secondary | ICD-10-CM | POA: Diagnosis not present

## 2018-09-03 DIAGNOSIS — E1165 Type 2 diabetes mellitus with hyperglycemia: Secondary | ICD-10-CM

## 2018-09-03 DIAGNOSIS — E039 Hypothyroidism, unspecified: Secondary | ICD-10-CM | POA: Diagnosis not present

## 2018-09-03 DIAGNOSIS — Z5111 Encounter for antineoplastic chemotherapy: Secondary | ICD-10-CM | POA: Diagnosis not present

## 2018-09-03 LAB — CBC WITH DIFFERENTIAL/PLATELET
Basophils Absolute: 0 10*3/uL (ref 0–0.1)
Basophils Relative: 0 %
EOS PCT: 0 %
Eosinophils Absolute: 0 10*3/uL (ref 0–0.7)
HEMATOCRIT: 29.7 % — AB (ref 35.0–47.0)
Hemoglobin: 9.7 g/dL — ABNORMAL LOW (ref 12.0–16.0)
LYMPHS PCT: 13 %
Lymphs Abs: 0.6 10*3/uL — ABNORMAL LOW (ref 1.0–3.6)
MCH: 27.8 pg (ref 26.0–34.0)
MCHC: 32.6 g/dL (ref 32.0–36.0)
MCV: 85.3 fL (ref 80.0–100.0)
MONO ABS: 0.3 10*3/uL (ref 0.2–0.9)
Monocytes Relative: 7 %
NEUTROS ABS: 3.8 10*3/uL (ref 1.4–6.5)
Neutrophils Relative %: 80 %
PLATELETS: 160 10*3/uL (ref 150–440)
RBC: 3.48 MIL/uL — ABNORMAL LOW (ref 3.80–5.20)
RDW: 22.8 % — AB (ref 11.5–14.5)
WBC: 4.8 10*3/uL (ref 3.6–11.0)

## 2018-09-03 LAB — COMPREHENSIVE METABOLIC PANEL
ALBUMIN: 3.3 g/dL — AB (ref 3.5–5.0)
ALK PHOS: 63 U/L (ref 38–126)
ALT: 11 U/L (ref 0–44)
ANION GAP: 9 (ref 5–15)
AST: 22 U/L (ref 15–41)
BILIRUBIN TOTAL: 0.7 mg/dL (ref 0.3–1.2)
BUN: 11 mg/dL (ref 8–23)
CALCIUM: 9.2 mg/dL (ref 8.9–10.3)
CO2: 24 mmol/L (ref 22–32)
Chloride: 103 mmol/L (ref 98–111)
Creatinine, Ser: 1.41 mg/dL — ABNORMAL HIGH (ref 0.44–1.00)
GFR calc Af Amer: 43 mL/min — ABNORMAL LOW (ref >60)
GFR calc non Af Amer: 37 mL/min — ABNORMAL LOW (ref >60)
GLUCOSE: 124 mg/dL — AB (ref 70–99)
POTASSIUM: 3.9 mmol/L (ref 3.5–5.1)
Sodium: 136 mmol/L (ref 135–145)
TOTAL PROTEIN: 6.2 g/dL — AB (ref 6.5–8.1)

## 2018-09-03 LAB — MAGNESIUM: Magnesium: 1.3 mg/dL — ABNORMAL LOW (ref 1.7–2.4)

## 2018-09-03 LAB — SAMPLE TO BLOOD BANK

## 2018-09-03 MED ORDER — SODIUM CHLORIDE 0.9 % IV SOLN: 2.0000 g | Freq: Once | INTRAVENOUS | Status: DC

## 2018-09-03 MED ORDER — SODIUM CHLORIDE 0.9 % IV SOLN
60.0000 mg/m2 | Freq: Once | INTRAVENOUS | Status: AC
  Administered 2018-09-03: 140 mg via INTRAVENOUS
  Filled 2018-09-03: qty 14

## 2018-09-03 MED ORDER — MAGNESIUM SULFATE 2 GM/50ML IV SOLN
2.0000 g | Freq: Once | INTRAVENOUS | Status: AC
  Administered 2018-09-03: 2 g via INTRAVENOUS
  Filled 2018-09-03: qty 50

## 2018-09-03 MED ORDER — DEXAMETHASONE SODIUM PHOSPHATE 10 MG/ML IJ SOLN
10.0000 mg | Freq: Once | INTRAMUSCULAR | Status: AC
  Administered 2018-09-03: 10 mg via INTRAVENOUS
  Filled 2018-09-03: qty 1

## 2018-09-03 MED ORDER — SODIUM CHLORIDE 0.9 % IV SOLN
Freq: Once | INTRAVENOUS | Status: AC
  Administered 2018-09-03: 12:00:00 via INTRAVENOUS
  Filled 2018-09-03: qty 250

## 2018-09-03 MED ORDER — HEPARIN SOD (PORK) LOCK FLUSH 100 UNIT/ML IV SOLN
500.0000 [IU] | Freq: Once | INTRAVENOUS | Status: AC | PRN
  Administered 2018-09-03: 500 [IU]
  Filled 2018-09-03: qty 5

## 2018-09-03 MED ORDER — SODIUM CHLORIDE 0.9 % IV SOLN
Freq: Once | INTRAVENOUS | Status: AC
  Administered 2018-09-03: 10:00:00 via INTRAVENOUS
  Filled 2018-09-03: qty 250

## 2018-09-03 NOTE — Progress Notes (Signed)
Patient's parameters for treatment were within normal limits.Proceeded with Taxotere. Also one liter NS and 2g Mg given Patient stable and aware that treatment was given to her. Tolerated treatment well. Discharged home, escorted out by wheelchair.

## 2018-09-12 ENCOUNTER — Other Ambulatory Visit: Payer: Self-pay

## 2018-09-12 ENCOUNTER — Telehealth: Payer: Self-pay | Admitting: *Deleted

## 2018-09-12 ENCOUNTER — Ambulatory Visit: Payer: PPO

## 2018-09-12 ENCOUNTER — Inpatient Hospital Stay (HOSPITAL_BASED_OUTPATIENT_CLINIC_OR_DEPARTMENT_OTHER): Payer: PPO | Admitting: Hospice and Palliative Medicine

## 2018-09-12 ENCOUNTER — Inpatient Hospital Stay: Payer: PPO | Attending: Oncology | Admitting: Oncology

## 2018-09-12 VITALS — BP 84/57 | HR 94 | Temp 94.5°F | Resp 22

## 2018-09-12 DIAGNOSIS — R5381 Other malaise: Secondary | ICD-10-CM | POA: Diagnosis not present

## 2018-09-12 DIAGNOSIS — Z515 Encounter for palliative care: Secondary | ICD-10-CM

## 2018-09-12 DIAGNOSIS — C3412 Malignant neoplasm of upper lobe, left bronchus or lung: Secondary | ICD-10-CM

## 2018-09-12 DIAGNOSIS — R6251 Failure to thrive (child): Secondary | ICD-10-CM

## 2018-09-12 DIAGNOSIS — Z923 Personal history of irradiation: Secondary | ICD-10-CM | POA: Diagnosis not present

## 2018-09-12 DIAGNOSIS — E46 Unspecified protein-calorie malnutrition: Secondary | ICD-10-CM

## 2018-09-12 DIAGNOSIS — E860 Dehydration: Secondary | ICD-10-CM

## 2018-09-12 DIAGNOSIS — F5089 Other specified eating disorder: Secondary | ICD-10-CM | POA: Diagnosis not present

## 2018-09-12 DIAGNOSIS — Z9221 Personal history of antineoplastic chemotherapy: Secondary | ICD-10-CM

## 2018-09-12 DIAGNOSIS — D701 Agranulocytosis secondary to cancer chemotherapy: Secondary | ICD-10-CM

## 2018-09-12 DIAGNOSIS — Z95828 Presence of other vascular implants and grafts: Secondary | ICD-10-CM

## 2018-09-12 DIAGNOSIS — Z66 Do not resuscitate: Secondary | ICD-10-CM

## 2018-09-12 DIAGNOSIS — R42 Dizziness and giddiness: Secondary | ICD-10-CM

## 2018-09-12 LAB — CBC WITH DIFFERENTIAL/PLATELET
Abs Immature Granulocytes: 0 10*3/uL (ref 0.00–0.07)
BASOS PCT: 1 %
Basophils Absolute: 0 10*3/uL (ref 0.0–0.1)
Eosinophils Absolute: 0 10*3/uL (ref 0.0–0.5)
Eosinophils Relative: 1 %
HCT: 28 % — ABNORMAL LOW (ref 36.0–46.0)
HEMOGLOBIN: 8.8 g/dL — AB (ref 12.0–15.0)
IMMATURE GRANULOCYTES: 0 %
LYMPHS ABS: 0.4 10*3/uL — AB (ref 0.7–4.0)
LYMPHS PCT: 52 %
MCH: 27.2 pg (ref 26.0–34.0)
MCHC: 31.4 g/dL (ref 30.0–36.0)
MCV: 86.4 fL (ref 80.0–100.0)
MONOS PCT: 18 %
Monocytes Absolute: 0.2 10*3/uL (ref 0.1–1.0)
NEUTROS ABS: 0.2 10*3/uL — AB (ref 1.7–7.7)
NEUTROS PCT: 28 %
Platelets: 119 10*3/uL — ABNORMAL LOW (ref 150–400)
RBC: 3.24 MIL/uL — ABNORMAL LOW (ref 3.87–5.11)
RDW: 20.2 % — AB (ref 11.5–15.5)
WBC: 0.8 10*3/uL — CL (ref 4.0–10.5)
nRBC: 0 % (ref 0.0–0.2)

## 2018-09-12 LAB — COMPREHENSIVE METABOLIC PANEL
ALT: 12 U/L (ref 0–44)
AST: 22 U/L (ref 15–41)
Albumin: 3.3 g/dL — ABNORMAL LOW (ref 3.5–5.0)
Alkaline Phosphatase: 60 U/L (ref 38–126)
Anion gap: 11 (ref 5–15)
BUN: 17 mg/dL (ref 8–23)
CALCIUM: 9.4 mg/dL (ref 8.9–10.3)
CO2: 23 mmol/L (ref 22–32)
CREATININE: 1.31 mg/dL — AB (ref 0.44–1.00)
Chloride: 101 mmol/L (ref 98–111)
GFR calc non Af Amer: 41 mL/min — ABNORMAL LOW (ref >60)
GFR, EST AFRICAN AMERICAN: 47 mL/min — AB (ref >60)
Glucose, Bld: 82 mg/dL (ref 70–99)
Potassium: 4 mmol/L (ref 3.5–5.1)
SODIUM: 135 mmol/L (ref 135–145)
Total Bilirubin: 1.5 mg/dL — ABNORMAL HIGH (ref 0.3–1.2)
Total Protein: 6.3 g/dL — ABNORMAL LOW (ref 6.5–8.1)

## 2018-09-12 LAB — MAGNESIUM: Magnesium: 1.3 mg/dL — ABNORMAL LOW (ref 1.7–2.4)

## 2018-09-12 MED ORDER — SODIUM CHLORIDE 0.9 % IV SOLN
6.0000 g | Freq: Once | INTRAVENOUS | Status: AC
  Administered 2018-09-12: 6 g via INTRAVENOUS
  Filled 2018-09-12: qty 10

## 2018-09-12 MED ORDER — SODIUM CHLORIDE 0.9 % IV SOLN
Freq: Once | INTRAVENOUS | Status: AC
  Administered 2018-09-12: 14:00:00 via INTRAVENOUS
  Filled 2018-09-12: qty 250

## 2018-09-12 NOTE — Progress Notes (Signed)
Symptom Management Consult note [facility]  Telephone:([phone] [phone]  [name], [name] [name], [name] [name], [name] [name], [name], [name], [name]   Name of the patient: Susan Willis  [id number]  1951/06/04   Date of visit: 09/12/2018  Diagnosis: Malignant neoplasm of upper lobe of left lung  Chief Complaint: dizziness/weakness  Current Treatment: s/p Day 1 cycle 8 Taxotere. Last given 09/03/18.   Oncology History: Patient last seen by Dr. Grayland Ormond on 09/03/2018 prior to cycle 8 Taxotere.  At that visit she continued to complain of chronic weakness and fatigue, decreasing performance status, dizziness and increased pain.  Dr. Grayland Ormond recommended an MRI of her brain to rule out acute abnormalities.  She noted her diarrhea to be improving and her peripheral neuropathy to be essentially unchanged.  Given her performance status plan was to discontinue Taxotere and for her to only receive IV fluids but she mistakenly received treatment despite physician request.  Plan is to reimage in 1 to 2 weeks to assess response to treatment.  They also discussed initiation of hospice.   She was seen in [facility] by me on 08/22/2018 for weakness and diarrhea.  She was found to be hypotensive, anemic with hypomagnesemia.  She was set up for 1 unit packed red blood cells and 6 g IV magnesium.  Stool sample was collected that revealed C. Difficile.  She was started on oral vancomycin next day. She has completed course of antibiotics with near complete resolution of diarrhea.   Additionally, she was found to have folliculitis to right perianal/intersphincteric area. Started on [employer].   Oncology History    Patient completed her initial treatment with  concurrent chemotherapy and XRT on March 07, 2016. She then underwent 2 cycles of consolidation carboplatinum and Taxol completing on Apr 27, 2016.  CT scan results from Apr 18, 2017 revealed progression of disease. She subsequently received additional XRT and proceeded with nivolumab every 2 weeks.  She completed a total of 13 infusions of nivolumab on November 06, 2017.  PET scan on November 30, 2017 revealed progression of disease.  Patient started carboplatinum and gemcitabine on December 11, 2017.  Single agent Taxotere was initiated on April 02, 2017.       Malignant neoplasm of upper lobe of left lung ([place])   12/07/2015 Initial Diagnosis    Malignant neoplasm of upper lobe of left lung ([place])    04/01/2018 -  Chemotherapy    The patient had pegfilgrastim-cbqv (UDENYCA) injection 6 mg, 6 mg, Subcutaneous, Once, 7 of 11 cycles DOCEtaxel (TAXOTERE) 170 mg in sodium chloride 0.9 % 250 mL chemo infusion, 75 mg/m2 = 170 mg, Intravenous,  Once, 8 of 12 cycles Dose modification: 67 mg/m2 (original dose 75 mg/m2, Cycle 2, Reason: Dose not tolerated), 60 mg/m2 (original dose 75 mg/m2, Cycle 7, Reason: Dose not tolerated), 60 mg/m2 (original dose 60 mg/m2, Cycle 6, Reason: Dose not tolerated) Administration: 170 mg (04/02/2018), 150 mg (05/02/2018), 150 mg (05/21/2018), 150 mg (06/11/2018), 150 mg (07/02/2018), 140 mg (08/13/2018), 140 mg (09/03/2018), 140 mg (07/23/2018)  for chemotherapy treatment.      Subjective Data:  Subjective:     [name] is a 67 y.o. female who presents for evaluation of dehydration, weakness and dizziness.  It has been present for several weeks  but has become progressive.  Associated signs & symptoms:  inability to keep down fluids and very weak.  She has been trying to eat small soft meals but lacks an appetite.  States all she does is lay around and sleep.  Unable to ambulate without the use of a stabilizer either a cane or the wall/furniture.  Needs help with ADLs.  Denies pain,  shortness of breath, chest pain, constipation, abdominal pain or edema.  Has significant neuropathy in bilateral upper extremities.  States she has no feeling in her fingers.  Her toes are unaffected.   The following portions of the patient's history were reviewed and updated as appropriate: allergies, current medications, past family history, past medical history, past social history, past surgical history and problem list.  Review of Systems A comprehensive review of systems was negative except for: Constitutional: positive for anorexia, fatigue and malaise Respiratory: positive for cough Cardiovascular: positive for dyspnea and fatigue Gastrointestinal: positive for diarrhea and nausea Musculoskeletal: positive for muscle weakness       Objective:    BP (!) 84/57 (BP Location: Left Wrist, Patient Position: Sitting)   Pulse 94   Temp (!) 94.5 F (34.7 C) (Tympanic)   Resp (!) 22  General appearance: alert, fatigued, no distress and pale Back: symmetric, no curvature. ROM normal. No CVA tenderness. Lungs: diminished breath sounds LLL Heart: regular rate and rhythm, S1, S2 normal, no murmur, click, rub or gallop Abdomen: soft, non-tender; bowel sounds normal; no masses,  no organomegaly Neurologic: Gait: unstable and weak    Assessment:     Weakness d/t dehydration, declining performance status d/t progression of lung cancer, malnutrition  Plan:    IV fluids per orders. Labs per orders. Palliative care consult with Burnell Blanks, NP    Stage IIIa squamous cell carcinoma of the lung: s/p several lines of therapy and radiation.  Most recent chemotherapy was with single agent Taxotere that began in April 2019.  She has received a total of 8 cycles of single agent Taxotere last being on 09/03/2018.  Scheduled to have imaging (CT C/A/P, MRI brain) this week but unfortunately she is quickly declining.   Weakness/malnutrition/hypotension/diarrhea: d/t recent chemotherapy, progression of  disease and hypomagnesmia.   She will receive 1 L NaCl in clinic today along with 6 g IV Magnesium.  Completed course of vancomycin for C. difficile infection.  Will recheck stool today clinic.  Unfortunately, patient unable to produce sample in clinic.  Asked daughter to return ASAP.   Hypomagnesemia: d/t continued diarrhea and dehydration. Afebrile. Denies abdominal pain. Received 6 g magnesium while in clinic.   Neutropenia: ANC 200. D/t recent chemo. Maintain neutropenic precautions.   Declining performance status: Daughter has noticed a significant decline in her mother's health/performance status, specifically with oral intake and generalized weakness.  Agreeable to meet with palliative medicine, Josh borders NP.  He spoke at length with patient and daughter and they are agreeable to transitioning focus on comfort and symptoms rather than continuing treatment or evaluation of disease progression with imaging.  Will cancel future imaging. Hospice referral sent today.  Patient would prefer to die at a hospice home rather then her home.  DNR was completed.  At this point, no further follow-up will be scheduled.  Patient knows to call clinic with any concerns, questions or complaints.  Greater than 50% was spent in counseling and coordination of care with this patient including but not limited to discussion of the relevant topics above (See A&P) including,  but not limited to diagnosis and management of acute and chronic medical conditions.   Faythe Casa, NP 09/13/2018 9:36 AM

## 2018-09-12 NOTE — Telephone Encounter (Signed)
Daughter called and reports that patient is doing poorly and requires someone with her constantly now. She is weak, "swimmyheaded" thinks her Mag is low or that she needs IV fluids, Hospice was discussed at her last visit with Dr Grayland Ormond, but she is to have a CT Scan first which is scheduled for Sat, asking that it be moved up.  APpt given for 1 PM and accepted

## 2018-09-12 NOTE — Progress Notes (Signed)
[place]  Telephone:([phone] [phone]   Name: Susan Willis Date: 09/12/2018 MRN: [id number]  DOB: 28-Oct-1951  [name], [name] [name], [name] [name], [name] [name], [name], [name], [name]    REASON FOR CONSULTATION: Palliative Care consult requested for this 67 y.o. 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  Patient initially received concurrent chemotherapy and radiation in April 2017 and was treated later that year with carboplatinum and Taxol.  CT scans later revealed progression of disease.  Patient again received XRT and was treated with nivolumab.  PET scan in December 2018 revealed disease progression.  Patient was started on carboplatinum and gemcitabine on December 11, 2017.  Single agent Taxotere was started on April 02, 2018.  Despite ongoing treatment patient has clinically declined over the past several months.  She has persistent diarrhea, poor oral intake, and a declining performance status.  Patient was brought today to the symptom management clinic for weakness and inability to eat and drink.  She was found to have hypomagnesemia (serum magnesium of 1.3) and is receiving IV magnesium in addition to a fluid bolus for hypotension.  Palliative care was asked to help address goals with patient and her daughter.  SOCIAL HISTORY:    Patient is not married.  She lives at home with daughter.  She has 6 children.  ADVANCE DIRECTIVES:  Advanced directives on file.  CODE STATUS: DNR  PAST MEDICAL HISTORY: Past Medical History:  Diagnosis Date  . Arthritis   . Blood  dyscrasia   . Cancer of left lung ([place]) 08/2015   Rad + chemo tx's.  . Diabetes mellitus without complication ([place])   . Diabetic neuropathy ([place])   . High cholesterol   . Hypertension   . Insulin dependent diabetes mellitus ([place])     PAST SURGICAL HISTORY:  Past Surgical History:  Procedure Laterality Date  . ABDOMINAL HYSTERECTOMY    . CESAREAN SECTION    . ELECTROMAGNETIC NAVIGATION BROCHOSCOPY N/A 11/26/2015   Procedure: ELECTROMAGNETIC NAVIGATION BRONCHOSCOPY;  Surgeon: Flora Lipps, MD;  Location: [facility] ORS;  Service: Cardiopulmonary;  Laterality: N/A;  . ENDOBRONCHIAL ULTRASOUND N/A 11/26/2015   Procedure: ENDOBRONCHIAL ULTRASOUND;  Surgeon: Flora Lipps, MD;  Location: [facility] ORS;  Service: Cardiopulmonary;  Laterality: N/A;  . PERIPHERAL VASCULAR CATHETERIZATION N/A 12/09/2015   Procedure: Glori Luis Cath Insertion;  Surgeon: Katha Cabal, MD;  Location: [facility];  Service: Cardiovascular;  Laterality: N/A;    HEMATOLOGY/ONCOLOGY HISTORY:  Oncology History    Patient completed her initial treatment with concurrent chemotherapy and XRT on March 07, 2016. She then underwent 2 cycles of consolidation carboplatinum and Taxol completing on Apr 27, 2016.  CT scan results from Apr 18, 2017 revealed progression of disease. She subsequently received additional XRT and proceeded with nivolumab every 2 weeks.  She completed a total of 13 infusions of nivolumab on November 06, 2017.  PET scan on November 30, 2017 revealed progression of disease.  Patient started carboplatinum and gemcitabine on December 11, 2017.  Single agent Taxotere was initiated on April 02, 2017.       Malignant neoplasm of upper lobe of left lung ([place])   12/07/2015 Initial Diagnosis  Malignant neoplasm of upper lobe of left lung ([place])    04/01/2018 -  Chemotherapy    The patient had pegfilgrastim-cbqv (UDENYCA) injection 6 mg, 6 mg, Subcutaneous, Once, 7 of 11 cycles DOCEtaxel (TAXOTERE) 170 mg in sodium  chloride 0.9 % 250 mL chemo infusion, 75 mg/m2 = 170 mg, Intravenous,  Once, 8 of 12 cycles Dose modification: 67 mg/m2 (original dose 75 mg/m2, Cycle 2, Reason: Dose not tolerated), 60 mg/m2 (original dose 75 mg/m2, Cycle 7, Reason: Dose not tolerated), 60 mg/m2 (original dose 60 mg/m2, Cycle 6, Reason: Dose not tolerated) Administration: 170 mg (04/02/2018), 150 mg (05/02/2018), 150 mg (05/21/2018), 150 mg (06/11/2018), 150 mg (07/02/2018), 140 mg (08/13/2018), 140 mg (09/03/2018), 140 mg (07/23/2018)  for chemotherapy treatment.      ALLERGIES:  has No Known Allergies.  MEDICATIONS:  Current Outpatient Medications  Medication Sig Dispense Refill  . albuterol (PROVENTIL HFA;VENTOLIN HFA) 108 (90 Base) MCG/ACT inhaler Inhale 2 puffs into the lungs every 6 (six) hours as needed for wheezing or shortness of breath. 1 Inhaler 2  . atorvastatin (LIPITOR) 20 MG tablet Take 20 mg by mouth daily at 6 PM.    . gabapentin (NEURONTIN) 300 MG capsule Take 1 capsule (300 mg total) by mouth at bedtime. 30 capsule 2  . insulin glargine (LANTUS) 100 UNIT/ML injection Inject 0.52 mLs (52 Units total) into the skin daily. 10 mL 11  . levothyroxine (SYNTHROID, LEVOTHROID) 150 MCG tablet Take 200 mcg by mouth daily before breakfast.     . lidocaine-prilocaine (EMLA) cream Apply to affected area once 30 g 6  . linagliptin (TRADJENTA) 5 MG TABS tablet Take by mouth.    Marland Kitchen lisinopril (PRINIVIL,ZESTRIL) 10 MG tablet Take 10 mg by mouth daily.    . magic mouthwash w/lidocaine SOLN Take 5 mLs by mouth 4 (four) times daily. 240 mL 1  . magnesium oxide (MAG-OX) 400 (241.3 Mg) MG tablet Take 1 tablet (400 mg total) by mouth daily. 14 tablet 0  . nystatin (MYCOSTATIN/NYSTOP) powder Apply topically 4 (four) times daily. 15 g 0  . oxyCODONE (OXY IR/ROXICODONE) 5 MG immediate release tablet Take 1 tablet (5 mg total) by mouth every 6 (six) hours as needed for severe pain. 60 tablet 0  . polyethylene glycol (MIRALAX / GLYCOLAX) packet  Take 17 g by mouth daily as needed for mild constipation. 30 each 0  . vancomycin (VANCOCIN HCL) 125 MG capsule Take 1 capsule (125 mg total) by mouth 4 (four) times daily. 40 capsule 0   No current facility-administered medications for this visit.    Facility-Administered Medications Ordered in Other Visits  Medication Dose Route Frequency Provider Last Rate Last Dose  . heparin lock flush 100 unit/mL  500 Units Intravenous Once [name], MD      . magnesium sulfate 6 g in sodium chloride 0.9 % 500 mL  6 g Intravenous Once Jacquelin Hawking, NP 171 mL/hr at 09/12/18 1416 6 g at 09/12/18 1416  . sodium chloride flush (NS) 0.9 % injection 10 mL  10 mL Intravenous Once [name], MD        VITAL SIGNS: There were no vitals taken for this visit. There were no vitals filed for this visit.  Estimated body mass index is 40.23 kg/m as calculated from the following:   Height as of 09/03/18: _0  (1.626 m).   Weight as of 09/03/18: 234 lb 6.4 oz (106.3 kg).  LABS: CBC:    Component Value Date/Time  WBC 0.8 (LL) 09/12/2018 1310   HGB 8.8 (L) 09/12/2018 1310   HGB 11.9 (L) 08/21/2012 1022   HCT 28.0 (L) 09/12/2018 1310   HCT 36.3 08/21/2012 1022   PLT 119 (L) 09/12/2018 1310   PLT 164 08/21/2012 1022   MCV 86.4 09/12/2018 1310   MCV 87 08/21/2012 1022   NEUTROABS 0.2 (L) 09/12/2018 1310   LYMPHSABS 0.4 (L) 09/12/2018 1310   MONOABS 0.2 09/12/2018 1310   EOSABS 0.0 09/12/2018 1310   BASOSABS 0.0 09/12/2018 1310   Comprehensive Metabolic Panel:    Component Value Date/Time   NA 135 09/12/2018 1310   NA 143 08/21/2012 1022   K 4.0 09/12/2018 1310   K 3.5 08/21/2012 1022   CL 101 09/12/2018 1310   CL 110 (H) 08/21/2012 1022   CO2 23 09/12/2018 1310   CO2 27 08/21/2012 1022   BUN 17 09/12/2018 1310   BUN 7 08/21/2012 1022   CREATININE 1.31 (H) 09/12/2018 1310   CREATININE 0.59 (L) 08/21/2012 1022   GLUCOSE 82 09/12/2018 1310   GLUCOSE 175 (H) 08/21/2012  1022   CALCIUM 9.4 09/12/2018 1310   CALCIUM 9.2 08/21/2012 1022   AST 22 09/12/2018 1310   ALT 12 09/12/2018 1310   ALKPHOS 60 09/12/2018 1310   BILITOT 1.5 (H) 09/12/2018 1310   PROT 6.3 (L) 09/12/2018 1310   ALBUMIN 3.3 (L) 09/12/2018 1310    RADIOGRAPHIC STUDIES: Dg Chest 2 View  Result Date: 08/22/2018 CLINICAL DATA:  Coughing and wheezing for 2 months. Lung cancer. History of diabetes and hypertension. EXAM: CHEST - 2 VIEW COMPARISON:  Radiographs 08/23/2016.  PET-CT 06/19/2018. FINDINGS: Right IJ Port-A-Cath extends to the level of the lower SVC. The heart size is stable. Left upper lobe mass and adjacent parenchymal opacity obscuring the aortic arch are grossly stable from PET-CT of 2 months ago. Patchy left lower lobe airspace disease may be slightly increased. The right lung is clear. No osseous abnormalities are identified. IMPRESSION: Possible increased left lower lobe airspace disease compared with PET-CT of 2 months ago. This could reflect atelectasis or inflammation. Grossly stable left upper lobe mass and adjacent parenchymal opacity. Electronically Signed   By: Richardean Sale M.D.   On: 08/22/2018 16:59    PERFORMANCE STATUS (ECOG) : 3 - Symptomatic, >50% confined to bed  Review of Systems As noted above. Otherwise, a complete review of systems is negative.  Physical Exam General: NAD, frail appearing, in wheelchair Cardiovascular: regular rate and rhythm Pulmonary: clear ant fields Abdomen: soft, nontender GU: no suprapubic tenderness Extremities: no edema, no joint deformities Skin: no rashes Neurological: Weakness but otherwise nonfocal  IMPRESSION: I met today with patient and her daughter in the clinic.  Together we reviewed patient's current medical problems including metabolic derangements, poor oral intake, and progressive weakness.  Patient says that she has significantly declined over the past few weeks.  At baseline, patient is ambulatory for only a few  feet at a time.  She is mostly in the bed due to fatigue.  Patient says she has had no appreciable oral intake in several days and has had declining oral intake over weeks.  We discussed option of work-up and treatment including repeat imaging to evaluate for disease progression and hospitalization to manage her current symptoms.  Alternatively, we discussed the option of transitioning care to focus on her comfort and symptoms and for patient to return home under hospice care.  Patient candidly spoke of her declining status and the possibility  that she is approaching end-of-life.  She told me that she does not think that the current treatment plan is helping nor would further medical interventions likely reverse her decline.  She says that she would like to forego future work-up and treatment and instead just focus on comfort measures.  She says she is agreeable to hospice involvement in the home.  We will send a referral today for hospice.  We discussed patient's end-of-life wishes.  She says that she would prefer not to die at home.  We talked about the option of the hospice home for end-of-life care.  Patient feels that her children would all be in agreement with this plan.  Patient says that she would not want to be resuscitated or have her life prolonged artificially.  She says she has a DNR order at home but is not sure where it is located.  I completed a new DNR order today and gave it to the daughter.  Case discussed with Dr. Grayland Ormond.   PLAN: 1. DNR 2. Hospice referral  Patient expressed understanding and was in agreement with this plan. She also understands that She can call clinic at any time with any questions, concerns, or complaints.    Time Total: 45 minutes  Visit consisted of counseling and education dealing with the complex and emotionally intense issues of symptom management and palliative care in the setting of serious and potentially life-threatening illness.Greater than 50%   of this time was spent counseling and coordinating care related to the above assessment and plan.  Signed by: Altha Harm, [place], NP-C, [place] (Work Cell)

## 2018-09-13 MED ORDER — HEPARIN SOD (PORK) LOCK FLUSH 100 UNIT/ML IV SOLN
500.0000 [IU] | Freq: Once | INTRAVENOUS | Status: AC
  Administered 2018-09-12: 500 [IU] via INTRAVENOUS

## 2018-09-13 MED ORDER — SODIUM CHLORIDE 0.9% FLUSH
10.0000 mL | INTRAVENOUS | Status: DC | PRN
  Administered 2018-09-12: 10 mL via INTRAVENOUS
  Filled 2018-09-13: qty 10

## 2018-09-14 ENCOUNTER — Other Ambulatory Visit: Payer: Self-pay | Admitting: *Deleted

## 2018-09-14 ENCOUNTER — Other Ambulatory Visit: Payer: Self-pay | Admitting: Oncology

## 2018-09-14 MED ORDER — OXYCODONE HCL 5 MG PO TABS: 5.0000 mg | ORAL_TABLET | Freq: Four times a day (QID) | ORAL | 0 refills | Status: DC | PRN

## 2018-09-14 NOTE — Progress Notes (Signed)
Patient called cancer center requesting refill of Oxycodone 5 mg tablet q 6 hours PRN.  [place] Controlled Substance Reporting System reviewed and refill is appropriate on or after 09/07/18 . Medication e-scribed to her pharmacy ([facility]) using [employer]'s 2-step verification process.    NCCSRS reviewed:     Faythe Casa, NP 09/14/2018 12:04 PM [phone]

## 2018-09-15 ENCOUNTER — Ambulatory Visit: Payer: PPO

## 2018-09-17 ENCOUNTER — Telehealth: Payer: Self-pay | Admitting: *Deleted

## 2018-09-17 ENCOUNTER — Ambulatory Visit: Payer: PPO

## 2018-09-17 ENCOUNTER — Other Ambulatory Visit: Payer: PPO

## 2018-09-17 ENCOUNTER — Ambulatory Visit: Payer: PPO | Admitting: Oncology

## 2018-09-17 NOTE — Telephone Encounter (Signed)
Patient requesting MMW prescription for sore mouth, Hospice nurse does not see any thrush present. Nurse Sena Slate also reports that patient has low BP and is having dizziness. Please advise

## 2018-09-17 NOTE — Telephone Encounter (Signed)
Yes to mouthwash.  Can they do IVF in the home with hospice?  If yes, give 1 liter NS of 2 hrs.  Thanks.

## 2018-09-17 NOTE — Telephone Encounter (Signed)
Call returned to [facility] and she will see if patient wants IV fluids and give if she agrees

## 2018-09-27 ENCOUNTER — Other Ambulatory Visit: Payer: Self-pay | Admitting: *Deleted

## 2018-09-27 MED ORDER — OXYCODONE HCL 5 MG PO TABS: 5.0000 mg | ORAL_TABLET | Freq: Four times a day (QID) | ORAL | 0 refills | Status: DC | PRN

## 2018-10-11 ENCOUNTER — Other Ambulatory Visit: Payer: Self-pay | Admitting: *Deleted

## 2018-10-11 MED ORDER — MORPHINE SULFATE (CONCENTRATE) 20 MG/ML PO SOLN: ORAL | 0 refills | Status: AC

## 2018-10-11 MED ORDER — LORAZEPAM 0.5 MG PO TABS: 0.5000 mg | ORAL_TABLET | ORAL | 0 refills | Status: AC | PRN

## 2018-10-11 NOTE — Telephone Encounter (Signed)
Patient called cancer center requesting refill of Ativan 0.5 mg q 4 hours and morphine 0.25-46ml q 1-2 hours.   As mandated by the [place] STOP Act (Strengthen Opioid Misuse Prevention), the [place] Controlled Substance Reporting System ([place]) was reviewed for this patient.  Below is the past 73-months of controlled substance prescriptions as displayed by the registry.  I have personally consulted with my supervising physician, Dr. Grayland Ormond, who agrees that continuation of opiate therapy is medically appropriate at this time and agrees to provide continual monitoring, including urine/blood drug screens, as indicated. Refill is appropriate on or after 10/11/18.  NCCSRS reviewed:     Faythe Casa, NP 10/11/2018 3:51 PM [phone]

## 2018-10-15 ENCOUNTER — Other Ambulatory Visit: Payer: Self-pay | Admitting: *Deleted

## 2018-10-15 MED ORDER — OXYCODONE HCL 5 MG PO TABS: 5.0000 mg | ORAL_TABLET | ORAL | 0 refills | Status: AC | PRN

## 2018-10-15 MED ORDER — FENTANYL 25 MCG/HR TD PT72: 25.0000 ug | MEDICATED_PATCH | TRANSDERMAL | 0 refills | Status: AC

## 2018-10-15 NOTE — Telephone Encounter (Signed)
Patient needs a long acting pain medicine as well

## 2018-10-29 ENCOUNTER — Other Ambulatory Visit: Payer: Self-pay | Admitting: *Deleted

## 2018-10-29 NOTE — Telephone Encounter (Signed)
Are they rotating the patch site?  Can switch to MS contin, but I would need help with the conversion dosing.

## 2018-10-29 NOTE — Telephone Encounter (Signed)
Per Hospice nurse Sena Slate she needs her pain medicine changed citing that the Fentanyl is making the patient itch only at the patch site. Please advise

## 2018-10-30 MED ORDER — MORPHINE SULFATE ER 15 MG PO TBCR: 15.0000 mg | EXTENDED_RELEASE_TABLET | Freq: Two times a day (BID) | ORAL | 0 refills | Status: AC

## 2018-10-30 NOTE — Telephone Encounter (Signed)
Per Sena Slate she feels the lower dose of MS ER would work, Reports that patient has been rotating patch areas

## 2018-11-05 ENCOUNTER — Telehealth: Payer: Self-pay

## 2018-11-05 NOTE — Telephone Encounter (Signed)
Prescription was sent on 10/30/2018.

## 2018-11-09 ENCOUNTER — Other Ambulatory Visit: Payer: Self-pay | Admitting: *Deleted

## 2018-11-09 MED ORDER — MORPHINE SULFATE (CONCENTRATE) 20 MG/ML PO SOLN: ORAL | 0 refills | Status: AC

## 2018-11-09 NOTE — Telephone Encounter (Signed)
Patient is actively dying and needs Roxanol sent to [facility]

## 2018-12-05 DEATH — deceased
